# Patient Record
Sex: Male | Born: 1955 | Race: Black or African American | Hispanic: No | Marital: Married | Smoking: Current some day smoker
Health system: Southern US, Community
[De-identification: ages and names within clinical notes are randomized; demographics above are authoritative.]

## PROBLEM LIST (undated history)

## (undated) DIAGNOSIS — K219 Gastro-esophageal reflux disease without esophagitis: Secondary | ICD-10-CM

## (undated) DIAGNOSIS — Z720 Tobacco use: Secondary | ICD-10-CM

## (undated) DIAGNOSIS — F329 Major depressive disorder, single episode, unspecified: Secondary | ICD-10-CM

## (undated) DIAGNOSIS — R51 Headache: Secondary | ICD-10-CM

## (undated) DIAGNOSIS — E785 Hyperlipidemia, unspecified: Secondary | ICD-10-CM

## (undated) DIAGNOSIS — I739 Peripheral vascular disease, unspecified: Secondary | ICD-10-CM

## (undated) DIAGNOSIS — I2510 Atherosclerotic heart disease of native coronary artery without angina pectoris: Secondary | ICD-10-CM

## (undated) DIAGNOSIS — R519 Headache, unspecified: Secondary | ICD-10-CM

## (undated) DIAGNOSIS — T7840XA Allergy, unspecified, initial encounter: Secondary | ICD-10-CM

## (undated) DIAGNOSIS — I10 Essential (primary) hypertension: Secondary | ICD-10-CM

## (undated) DIAGNOSIS — F32A Depression, unspecified: Secondary | ICD-10-CM

## (undated) HISTORY — DX: Headache: R51

## (undated) HISTORY — PX: PERCUTANEOUS CORONARY STENT INTERVENTION (PCI-S): SHX6016

## (undated) HISTORY — DX: Allergy, unspecified, initial encounter: T78.40XA

## (undated) HISTORY — PX: STOMACH SURGERY: SHX791

## (undated) HISTORY — DX: Headache, unspecified: R51.9

## (undated) HISTORY — DX: Atherosclerotic heart disease of native coronary artery without angina pectoris: I25.10

## (undated) HISTORY — DX: Major depressive disorder, single episode, unspecified: F32.9

## (undated) HISTORY — DX: Gastro-esophageal reflux disease without esophagitis: K21.9

## (undated) HISTORY — PX: TONSILLECTOMY: SUR1361

## (undated) HISTORY — DX: Peripheral vascular disease, unspecified: I73.9

## (undated) HISTORY — DX: Hyperlipidemia, unspecified: E78.5

## (undated) HISTORY — DX: Essential (primary) hypertension: I10

## (undated) HISTORY — DX: Depression, unspecified: F32.A

## (undated) HISTORY — DX: Tobacco use: Z72.0

---

## 2005-05-31 ENCOUNTER — Ambulatory Visit: Payer: Self-pay | Admitting: Gastroenterology

## 2008-01-14 ENCOUNTER — Emergency Department: Payer: Self-pay | Admitting: Internal Medicine

## 2008-01-20 ENCOUNTER — Ambulatory Visit: Payer: Self-pay | Admitting: Vascular Surgery

## 2008-02-06 ENCOUNTER — Ambulatory Visit: Payer: Self-pay | Admitting: Gastroenterology

## 2008-02-14 ENCOUNTER — Ambulatory Visit: Payer: Self-pay | Admitting: Cardiology

## 2008-02-14 ENCOUNTER — Ambulatory Visit: Payer: Self-pay | Admitting: Vascular Surgery

## 2008-02-20 ENCOUNTER — Inpatient Hospital Stay: Payer: Self-pay | Admitting: Vascular Surgery

## 2011-06-22 ENCOUNTER — Emergency Department: Payer: Self-pay | Admitting: Unknown Physician Specialty

## 2011-06-27 DIAGNOSIS — I779 Disorder of arteries and arterioles, unspecified: Secondary | ICD-10-CM

## 2011-06-27 DIAGNOSIS — I25118 Atherosclerotic heart disease of native coronary artery with other forms of angina pectoris: Secondary | ICD-10-CM | POA: Insufficient documentation

## 2011-06-27 DIAGNOSIS — I255 Ischemic cardiomyopathy: Secondary | ICD-10-CM | POA: Insufficient documentation

## 2011-06-27 DIAGNOSIS — I2510 Atherosclerotic heart disease of native coronary artery without angina pectoris: Secondary | ICD-10-CM | POA: Insufficient documentation

## 2011-06-27 DIAGNOSIS — I739 Peripheral vascular disease, unspecified: Secondary | ICD-10-CM | POA: Insufficient documentation

## 2011-06-27 DIAGNOSIS — K219 Gastro-esophageal reflux disease without esophagitis: Secondary | ICD-10-CM | POA: Insufficient documentation

## 2011-06-27 DIAGNOSIS — I714 Abdominal aortic aneurysm, without rupture: Secondary | ICD-10-CM | POA: Insufficient documentation

## 2011-06-27 HISTORY — DX: Disorder of arteries and arterioles, unspecified: I77.9

## 2011-06-29 DIAGNOSIS — R079 Chest pain, unspecified: Secondary | ICD-10-CM | POA: Insufficient documentation

## 2011-06-29 DIAGNOSIS — Z720 Tobacco use: Secondary | ICD-10-CM | POA: Insufficient documentation

## 2011-09-12 DIAGNOSIS — E785 Hyperlipidemia, unspecified: Secondary | ICD-10-CM | POA: Insufficient documentation

## 2011-09-12 DIAGNOSIS — G64 Other disorders of peripheral nervous system: Secondary | ICD-10-CM | POA: Insufficient documentation

## 2011-09-12 DIAGNOSIS — G629 Polyneuropathy, unspecified: Secondary | ICD-10-CM | POA: Insufficient documentation

## 2012-02-19 DIAGNOSIS — M545 Low back pain: Secondary | ICD-10-CM | POA: Insufficient documentation

## 2012-02-19 DIAGNOSIS — G8929 Other chronic pain: Secondary | ICD-10-CM | POA: Insufficient documentation

## 2013-02-20 DIAGNOSIS — I4729 Other ventricular tachycardia: Secondary | ICD-10-CM | POA: Insufficient documentation

## 2013-02-20 DIAGNOSIS — I472 Ventricular tachycardia: Secondary | ICD-10-CM | POA: Insufficient documentation

## 2013-02-20 HISTORY — DX: Other ventricular tachycardia: I47.29

## 2013-03-03 ENCOUNTER — Ambulatory Visit: Payer: Self-pay | Admitting: Gastroenterology

## 2013-03-05 DIAGNOSIS — I509 Heart failure, unspecified: Secondary | ICD-10-CM | POA: Insufficient documentation

## 2013-03-05 DIAGNOSIS — I5022 Chronic systolic (congestive) heart failure: Secondary | ICD-10-CM | POA: Insufficient documentation

## 2013-03-05 HISTORY — DX: Heart failure, unspecified: I50.9

## 2013-03-10 DIAGNOSIS — Z95810 Presence of automatic (implantable) cardiac defibrillator: Secondary | ICD-10-CM | POA: Insufficient documentation

## 2013-06-30 DIAGNOSIS — Z4502 Encounter for adjustment and management of automatic implantable cardiac defibrillator: Secondary | ICD-10-CM | POA: Insufficient documentation

## 2013-06-30 HISTORY — DX: Encounter for adjustment and management of automatic implantable cardiac defibrillator: Z45.02

## 2014-09-15 ENCOUNTER — Other Ambulatory Visit: Payer: Self-pay | Admitting: Family Medicine

## 2014-10-02 ENCOUNTER — Other Ambulatory Visit: Payer: Self-pay | Admitting: Family Medicine

## 2014-10-07 ENCOUNTER — Other Ambulatory Visit: Payer: Self-pay | Admitting: Family Medicine

## 2014-10-07 ENCOUNTER — Encounter: Payer: Self-pay | Admitting: Family Medicine

## 2014-10-07 ENCOUNTER — Ambulatory Visit (INDEPENDENT_AMBULATORY_CARE_PROVIDER_SITE_OTHER): Payer: Medicare Other | Admitting: Family Medicine

## 2014-10-07 VITALS — BP 116/80 | HR 91 | Temp 98.0°F | Resp 16 | Ht 69.0 in | Wt 132.1 lb

## 2014-10-07 DIAGNOSIS — N410 Acute prostatitis: Secondary | ICD-10-CM

## 2014-10-07 DIAGNOSIS — I714 Abdominal aortic aneurysm, without rupture, unspecified: Secondary | ICD-10-CM

## 2014-10-07 DIAGNOSIS — M545 Low back pain, unspecified: Secondary | ICD-10-CM

## 2014-10-07 DIAGNOSIS — I5022 Chronic systolic (congestive) heart failure: Secondary | ICD-10-CM

## 2014-10-07 DIAGNOSIS — I255 Ischemic cardiomyopathy: Secondary | ICD-10-CM

## 2014-10-07 DIAGNOSIS — G8929 Other chronic pain: Secondary | ICD-10-CM | POA: Diagnosis not present

## 2014-10-07 DIAGNOSIS — E785 Hyperlipidemia, unspecified: Secondary | ICD-10-CM

## 2014-10-07 DIAGNOSIS — I2510 Atherosclerotic heart disease of native coronary artery without angina pectoris: Secondary | ICD-10-CM | POA: Diagnosis not present

## 2014-10-07 DIAGNOSIS — F17200 Nicotine dependence, unspecified, uncomplicated: Secondary | ICD-10-CM

## 2014-10-07 DIAGNOSIS — J40 Bronchitis, not specified as acute or chronic: Secondary | ICD-10-CM | POA: Diagnosis not present

## 2014-10-07 MED ORDER — TAMSULOSIN HCL 0.4 MG PO CAPS
0.4000 mg | ORAL_CAPSULE | Freq: Every day | ORAL | Status: DC
Start: 2014-10-07 — End: 2015-09-27

## 2014-10-07 MED ORDER — DOXYCYCLINE HYCLATE 100 MG PO TABS: 100.0000 mg | ORAL_TABLET | Freq: Two times a day (BID) | ORAL | Status: DC

## 2014-10-07 MED ORDER — OXYCODONE-ACETAMINOPHEN 5-325 MG PO TABS: 1.0000 | ORAL_TABLET | Freq: Four times a day (QID) | ORAL | Status: DC | PRN

## 2014-10-07 MED ORDER — BENZONATATE 100 MG PO CAPS: 100.0000 mg | ORAL_CAPSULE | Freq: Two times a day (BID) | ORAL | Status: DC | PRN

## 2014-10-07 NOTE — Progress Notes (Signed)
Name: Martin Hernandez   MRN: [id number]    DOB: 12-09-1955   Date:10/07/2014       Progress Note  Subjective  Chief Complaint  Chief Complaint  Patient presents with  . Insomnia  . Depression  . Hyperlipidemia    Hyperlipidemia This is a chronic problem. The current episode started more than 1 year ago. The problem is controlled. Recent lipid tests were reviewed and are normal. Factors aggravating his hyperlipidemia include fatty foods. Associated symptoms include chest pain, leg pain and shortness of breath. Pertinent negatives include no focal weakness or myalgias. Current antihyperlipidemic treatment includes statins. The current treatment provides moderate improvement of lipids. There are no compliance problems.  Risk factors for coronary artery disease include diabetes mellitus, dyslipidemia, hypertension, male sex, a sedentary lifestyle and stress.  Back Pain This is a chronic problem. The current episode started more than 1 year ago. The problem occurs constantly. The problem is unchanged. The pain is present in the lumbar spine. The quality of the pain is described as stabbing and aching. The pain radiates to the right thigh and left thigh. The pain is moderate. The pain is the same all the time. The symptoms are aggravated by bending, twisting, position and stress. Stiffness is present all day. Associated symptoms include chest pain, dysuria, leg pain, numbness and weakness. Pertinent negatives include no fever, headaches, tingling or weight loss. Risk factors include sedentary lifestyle. He has tried NSAIDs and muscle relaxant for the symptoms. The treatment provided mild relief.  Heart Problem This is a chronic problem. The current episode started more than 1 year ago. The problem occurs intermittently. The problem has been unchanged. Associated symptoms include chest pain, coughing, diaphoresis, fatigue, neck pain, numbness and weakness. Pertinent negatives include no chills,  congestion, fever, headaches, myalgias, nausea, sore throat or vomiting. The symptoms are aggravated by stress and exertion. The treatment provided mild relief.  Urinary Tract Infection  This is a new problem. The current episode started 1 to 4 weeks ago. The problem occurs intermittently. The problem has been gradually improving. The quality of the pain is described as aching. The pain is mild. There has been no fever. He is sexually active. Associated symptoms include frequency, hesitancy and urgency. Pertinent negatives include no chills, flank pain, hematuria, nausea or vomiting. He has tried antibiotics for the symptoms. The treatment provided mild relief.  Breathing Problem He complains of cough, difficulty breathing, shortness of breath, sputum production and wheezing. There is no hemoptysis. This is a new problem. The current episode started 1 to 4 weeks ago. The problem occurs constantly. The problem has been gradually worsening. The cough is productive of sputum. Associated symptoms include chest pain, malaise/fatigue, nasal congestion and rhinorrhea. Pertinent negatives include no fever, headaches, heartburn, myalgias, sore throat or weight loss. His symptoms are aggravated by exposure to smoke, exposure to fumes and pollen. His symptoms are alleviated by nothing. Risk factors for lung disease include smoking/tobacco exposure. His past medical history is significant for COPD.      Past Medical History  Diagnosis Date  . Anemia   . Depression   . Hyperlipidemia   . GERD (gastroesophageal reflux disease)     History  Substance Use Topics  . Smoking status: Current Every Day Smoker  . Smokeless tobacco: Not on file  . Alcohol Use: No     Current outpatient prescriptions:  .  atorvastatin (LIPITOR) 80 MG tablet, , Disp: , Rfl:  .  clopidogrel (PLAVIX) 75 MG  tablet, , Disp: , Rfl:  .  DULoxetine (CYMBALTA) 30 MG capsule, TAKE ONE CAPSULE BY MOUTH DAILY AND THENINCREASE TO TWO  CAPSULES DAILY AS NEEDED, Disp: 60 capsule, Rfl: 5 .  lisinopril (PRINIVIL,ZESTRIL) 5 MG tablet, , Disp: , Rfl:  .  metoprolol succinate (TOPROL-XL) 25 MG 24 hr tablet, , Disp: , Rfl:  .  mirtazapine (REMERON SOL-TAB) 30 MG disintegrating tablet, , Disp: , Rfl:  .  omeprazole (PRILOSEC) 20 MG capsule, , Disp: , Rfl:  .  oxyCODONE-acetaminophen (PERCOCET/ROXICET) 5-325 MG per tablet, , Disp: , Rfl:  .  pantoprazole (PROTONIX) 40 MG tablet, , Disp: , Rfl:  .  traZODone (DESYREL) 100 MG tablet, TAKE ONE TABLET EVERY EVENING FOR SLEEP, Disp: 30 tablet, Rfl: 2  No Known Allergies  Review of Systems  Constitutional: Positive for malaise/fatigue, diaphoresis and fatigue. Negative for fever, chills and weight loss.  HENT: Positive for rhinorrhea. Negative for congestion, hearing loss, sore throat and tinnitus.   Eyes: Negative for blurred vision, double vision and redness.  Respiratory: Positive for cough, sputum production, shortness of breath and wheezing. Negative for hemoptysis.   Cardiovascular: Positive for chest pain, palpitations, orthopnea and claudication. Negative for leg swelling.  Gastrointestinal: Negative for heartburn, nausea, vomiting, diarrhea, constipation and blood in stool.  Genitourinary: Positive for dysuria, hesitancy, urgency and frequency. Negative for hematuria and flank pain.  Musculoskeletal: Positive for back pain, joint pain and neck pain. Negative for myalgias and falls.  Skin: Negative for itching.  Neurological: Positive for weakness and numbness. Negative for dizziness, tingling, tremors, focal weakness, seizures, loss of consciousness and headaches.  Endo/Heme/Allergies: Does not bruise/bleed easily.  Psychiatric/Behavioral: Negative for depression and substance abuse. The patient is not nervous/anxious and does not have insomnia.      Objective  Filed Vitals:   10/07/14 0946  BP: 116/80  Pulse: 91  Temp: 98 F (36.7 C)  Resp: 16  Height:  (1.753  m)  Weight: 132 lb 2 oz (59.932 kg)  SpO2: 89%     Physical Exam  Constitutional: He is oriented to person, place, and time and well-developed, well-nourished, and in no distress.  HENT:  Head: Normocephalic.  Eyes: EOM are normal. Pupils are equal, round, and reactive to light.  Neck: Normal range of motion. Neck supple. No thyromegaly present.  Cardiovascular: Normal rate and regular rhythm.   No murmur heard. S4 gallop  Pulmonary/Chest: No respiratory distress. He has no wheezes.  Diminished breath sounds throughout with basilar crackles  Abdominal: Soft. Bowel sounds are normal.  Musculoskeletal: He exhibits no edema.  Cervical and lower lumbar tenderness both greater on the left and right straight leg raising is positive at about 75  Lymphadenopathy:    He has no cervical adenopathy.  Neurological: He is alert and oriented to person, place, and time. No cranial nerve deficit. Gait normal. Coordination normal.  Skin: Skin is warm and dry. No rash noted.  Psychiatric: Affect and judgment normal.      Assessment & Plan  1. Arteriosclerosis of coronary artery Stable  2. Cardiomyopathy, ischemic Stable  3. Compulsive tobacco user syndrome Slowly  decreasing his consumption  4. Chronic LBP Unchanged - oxyCODONE-acetaminophen (PERCOCET/ROXICET) 5-325 MG per tablet; Take 1 tablet by mouth every 6 (six) hours as needed for severe pain.  Dispense: 120 tablet; Refill: 0  5. AAA (abdominal aortic aneurysm) Followed by cardiology and general surgery  6. Chronic systolic heart failure [facility] cardiologist  7. HLD (hyperlipidemia) Lipid panel return  visit with treat acutely  8. Bronchitis We'll treat acutely - doxycycline (VIBRA-TABS) 100 MG tablet; Take 1 tablet (100 mg total) by mouth 2 (two) times daily.  Dispense: 20 tablet; Refill: 0 - benzonatate (TESSALON) 100 MG capsule; Take 1 capsule (100 mg total) by mouth 2 (two) times daily as needed for cough.  Dispense: 20  capsule; Refill: 0  9. Acute prostatitis Not resolved - doxycycline (VIBRA-TABS) 100 MG tablet; Take 1 tablet (100 mg total) by mouth 2 (two) times daily.  Dispense: 20 tablet; Refill: 0 - tamsulosin (FLOMAX) 0.4 MG CAPS capsule; Take 1 capsule (0.4 mg total) by mouth daily.  Dispense: 30 capsule; Refill: 3

## 2014-10-07 NOTE — Addendum Note (Signed)
Addended by: Dennison MascotMORRISEY, Wadsworth Skolnick on: 10/07/2014 11:51 AM   Modules accepted: Kipp BroodSmartSet

## 2014-10-07 NOTE — Patient Instructions (Signed)
F/U in 2 mo 

## 2014-11-06 ENCOUNTER — Other Ambulatory Visit: Payer: Self-pay | Admitting: Family Medicine

## 2014-11-10 ENCOUNTER — Other Ambulatory Visit: Payer: Self-pay | Admitting: Family Medicine

## 2014-12-07 ENCOUNTER — Ambulatory Visit (INDEPENDENT_AMBULATORY_CARE_PROVIDER_SITE_OTHER): Payer: Medicare Other | Admitting: Family Medicine

## 2014-12-07 ENCOUNTER — Encounter: Payer: Self-pay | Admitting: Family Medicine

## 2014-12-07 VITALS — BP 120/70 | HR 88 | Temp 98.1°F | Resp 18 | Ht 69.0 in | Wt 128.1 lb

## 2014-12-07 DIAGNOSIS — F17200 Nicotine dependence, unspecified, uncomplicated: Secondary | ICD-10-CM

## 2014-12-07 DIAGNOSIS — G8929 Other chronic pain: Secondary | ICD-10-CM

## 2014-12-07 DIAGNOSIS — J431 Panlobular emphysema: Secondary | ICD-10-CM

## 2014-12-07 DIAGNOSIS — M545 Low back pain, unspecified: Secondary | ICD-10-CM

## 2014-12-07 DIAGNOSIS — G4700 Insomnia, unspecified: Secondary | ICD-10-CM | POA: Diagnosis not present

## 2014-12-07 DIAGNOSIS — I255 Ischemic cardiomyopathy: Secondary | ICD-10-CM | POA: Diagnosis not present

## 2014-12-07 DIAGNOSIS — K219 Gastro-esophageal reflux disease without esophagitis: Secondary | ICD-10-CM | POA: Diagnosis not present

## 2014-12-07 DIAGNOSIS — I2510 Atherosclerotic heart disease of native coronary artery without angina pectoris: Secondary | ICD-10-CM

## 2014-12-07 HISTORY — DX: Insomnia, unspecified: G47.00

## 2014-12-07 MED ORDER — OXYCODONE-ACETAMINOPHEN 5-325 MG PO TABS: 1.0000 | ORAL_TABLET | Freq: Four times a day (QID) | ORAL | Status: DC | PRN

## 2014-12-07 MED ORDER — IPRATROPIUM BROMIDE 0.02 % IN SOLN: 0.5000 mg | Freq: Four times a day (QID) | RESPIRATORY_TRACT | Status: DC

## 2014-12-07 MED ORDER — ZOLPIDEM TARTRATE 10 MG PO TABS: 10.0000 mg | ORAL_TABLET | Freq: Every day | ORAL | Status: DC

## 2014-12-07 MED ORDER — OMEPRAZOLE 40 MG PO CPDR: 40.0000 mg | DELAYED_RELEASE_CAPSULE | Freq: Every day | ORAL | Status: DC

## 2014-12-07 NOTE — Progress Notes (Signed)
Name: Martin Hernandez   MRN: [id number]    DOB: 01/24/1956   Date:12/07/2014       Progress Note  Subjective  Chief Complaint  Chief Complaint  Patient presents with  . Gastrophageal Reflux    current medication not working  . Insomnia  . Back Pain    chronic medication refills    HPI  Chronic pain  Patient continues with chronic pain in his lower back radiating under his lower extremities. The pain is worse is 10 out of 10 at best is 5 out of 10 with this medication. Is currently currently on Percocet emorning thank you very 6 hours. there've been no recent traumatic injuries to worsen the problem. Weather changes seem to make it worse however.   Next problem is erectile dysfunction  Patient desires medication for erectile dysfunction. He is informed because he is on a nitrate he is unable to get such prescription.  Next tobacco abuse  Patient smokes an average of her pack cigarettes per day and has done so for number of years. He has a.m. cough with minimal sputum production. Chronic problem with wheezing is less pronounced.  Insomnia.  Trazodone is not working effectively patient's insomnia problem. He this was changed from Ambien because of his insurance and has not been effective.  ASCVD.  Patient denying any current problems with any chest pain or significant shortness of breath.   Past Medical History  Diagnosis Date  . Anemia   . Depression   . Hyperlipidemia   . GERD (gastroesophageal reflux disease)     Social History  Substance Use Topics  . Smoking status: Current Every Day Smoker -- 1.30 packs/day    Types: Cigarettes  . Smokeless tobacco: Not on file  . Alcohol Use: No     Current outpatient prescriptions:  .  atorvastatin (LIPITOR) 80 MG tablet, , Disp: , Rfl:  .  clopidogrel (PLAVIX) 75 MG tablet, TAKE ONE (1) TABLET EACH DAY, Disp: 30 tablet, Rfl: 11 .  doxycycline (VIBRAMYCIN) 100 MG capsule, TAKE ONE CAPSULE BY MOUTH TWICE A DAY, Disp:  20 capsule, Rfl: 0 .  DULoxetine (CYMBALTA) 30 MG capsule, TAKE ONE CAPSULE BY MOUTH DAILY AND THENINCREASE TO TWO CAPSULES DAILY AS NEEDED, Disp: 60 capsule, Rfl: 5 .  lisinopril (PRINIVIL,ZESTRIL) 5 MG tablet, , Disp: , Rfl:  .  metoprolol succinate (TOPROL-XL) 25 MG 24 hr tablet, , Disp: , Rfl:  .  mirtazapine (REMERON SOL-TAB) 30 MG disintegrating tablet, , Disp: , Rfl:  .  omeprazole (PRILOSEC) 20 MG capsule, , Disp: , Rfl:  .  oxyCODONE-acetaminophen (PERCOCET/ROXICET) 5-325 MG per tablet, Take 1 tablet by mouth every 6 (six) hours as needed for severe pain., Disp: 120 tablet, Rfl: 0 .  pantoprazole (PROTONIX) 40 MG tablet, , Disp: , Rfl:  .  tamsulosin (FLOMAX) 0.4 MG CAPS capsule, Take 1 capsule (0.4 mg total) by mouth daily., Disp: 30 capsule, Rfl: 3 .  traZODone (DESYREL) 100 MG tablet, TAKE ONE TABLET EVERY EVENING FOR SLEEP, Disp: 30 tablet, Rfl: 2 .  zolpidem (AMBIEN) 10 MG tablet, TAKE ONE TABLET AT BEDTIME, Disp: 30 tablet, Rfl: 2  No Known Allergies  Review of Systems  Constitutional: Negative for fever, chills and weight loss.  HENT: Negative for congestion, hearing loss, sore throat and tinnitus.   Eyes: Negative for blurred vision, double vision and redness.  Respiratory: Positive for cough and shortness of breath. Negative for hemoptysis.   Cardiovascular: Negative for chest pain, palpitations, orthopnea,  claudication and leg swelling.  Gastrointestinal: Negative for heartburn, nausea, vomiting, diarrhea, constipation and blood in stool.  Genitourinary: Negative for dysuria, urgency, frequency and hematuria.       Erectile dysfunction  Musculoskeletal: Negative for myalgias, back pain, joint pain, falls and neck pain.  Skin: Negative for itching.  Neurological: Negative for dizziness, tingling, tremors, focal weakness, seizures, loss of consciousness, weakness and headaches.  Endo/Heme/Allergies: Does not bruise/bleed easily.  Psychiatric/Behavioral: Negative for  depression and substance abuse. The patient has insomnia. The patient is not nervous/anxious.      Objective  Filed Vitals:   12/07/14 0757  BP: 120/70  Pulse: 88  Temp: 98.1 F (36.7 C)  TempSrc: Oral  Resp: 18  Height:  (1.753 m)  Weight: 128 lb 1.6 oz (58.106 kg)  SpO2: 99%     Physical Exam  Constitutional: He is oriented to person, place, and time.  Frail male in no acute distress  HENT:  Head: Normocephalic.  Eyes: EOM are normal. Pupils are equal, round, and reactive to light.  Neck: Normal range of motion. Neck supple. No thyromegaly present.  Cardiovascular: Normal rate, regular rhythm and normal heart sounds.   No murmur heard. Pulmonary/Chest: No respiratory distress. He has no wheezes.  Markedly diminished breath sounds with increase hyperresonance to percussion.  Abdominal: Soft. Bowel sounds are normal.  Musculoskeletal: He exhibits no edema.  Significant lower back discomfort lumbar area radiation to the posterior gluteal and leg area with decreased ability to ambulate.  Lymphadenopathy:    He has no cervical adenopathy.  Neurological: He is alert and oriented to person, place, and time. No cranial nerve deficit. Gait normal. Coordination normal.  Skin: Skin is warm and dry. No rash noted.  Psychiatric: Affect and judgment normal.      Assessment & Plan  1. Chronic LBP Stable   - oxyCODONE-acetaminophen (PERCOCET/ROXICET) 5-325 MG per tablet; Take 1 tablet by mouth every 6 (six) hours as needed for severe pain.  Dispense: 120 tablet; Refill: 0 - oxyCODONE-acetaminophen (PERCOCET/ROXICET) 5-325 MG per tablet; Take 1 tablet by mouth every 6 (six) hours as needed for severe pain.  Dispense: 120 tablet; Refill: 0 - oxyCODONE-acetaminophen (PERCOCET/ROXICET) 5-325 MG per tablet; Take 1 tablet by mouth every 6 (six) hours as needed for severe pain.  Dispense: 120 tablet; Refill: 0  2. Gastroesophageal reflux disease without esophagitis Not improved  on pantoprazole - omeprazole (PRILOSEC) 40 MG capsule; Take 1 capsule (40 mg total) by mouth daily.  Dispense: 30 capsule; Refill: 7  3. Arteriosclerosis of coronary artery Stable  4. Cardiomyopathy, ischemic Stable  5. Compulsive tobacco user syndrome Unchanged - Spirometry: Pre & Post Eval  6. Insomnia Not responsive to trazodone  7. Panlobular emphysema PFTs show severe obstruction  Again encouraged to discontinue smoking  - ipratropium (ATROVENT) 0.02 % nebulizer solution; Take 2.5 mLs (0.5 mg total) by nebulization 4 (four) times daily.  Dispense: 75 mL; Refill: 12

## 2014-12-07 NOTE — Patient Instructions (Signed)
2 months for repeat PFTs and for pain meds

## 2015-02-03 ENCOUNTER — Telehealth: Payer: Self-pay | Admitting: Family Medicine

## 2015-02-03 DIAGNOSIS — M545 Low back pain: Principal | ICD-10-CM

## 2015-02-03 DIAGNOSIS — G8929 Other chronic pain: Secondary | ICD-10-CM

## 2015-02-03 MED ORDER — OXYCODONE-ACETAMINOPHEN 5-325 MG PO TABS: 1.0000 | ORAL_TABLET | Freq: Four times a day (QID) | ORAL | Status: DC | PRN

## 2015-02-03 NOTE — Telephone Encounter (Signed)
Tried calling the number below and it says number is no longer in service. That is also the number that is on a sticky note that the wife gave us saying that the husbands number her does not answer sometimes. Will try to see if i can get him on his number. But rx is in the book ready for pick up.

## 2015-02-03 NOTE — Telephone Encounter (Signed)
PT HAD BEEN CALLED NUMEROUS TIMES ABOUT CHANGING HIS APPT FROM Thursday TO ANOTHER DAY BUT COULD NOT LEAVE VOICE MAIL. PATIENT HAS NOW CALLED IN SAYING THAT HE IS NEEDING HIS PAIN MEDICATION REFILLED. PT IS OUT. ASKING THAT YOU PLEASE CALL HIS [phone] THIS IS HIS WIFE NUMBER.

## 2015-02-03 NOTE — Telephone Encounter (Signed)
Patient notified to pick up script.

## 2015-02-03 NOTE — Telephone Encounter (Signed)
Called another number in system and wife said that she gave us the wrong number. Number should be [phone]. Told them rx was ready for pickup.

## 2015-02-04 ENCOUNTER — Ambulatory Visit: Payer: Medicare Other | Admitting: Family Medicine

## 2015-02-26 ENCOUNTER — Encounter: Payer: Self-pay | Admitting: Family Medicine

## 2015-02-26 ENCOUNTER — Ambulatory Visit (INDEPENDENT_AMBULATORY_CARE_PROVIDER_SITE_OTHER): Payer: Medicare Other | Admitting: Family Medicine

## 2015-02-26 VITALS — BP 138/80 | HR 101 | Temp 98.3°F | Resp 16 | Ht 69.0 in | Wt 134.0 lb

## 2015-02-26 DIAGNOSIS — F17200 Nicotine dependence, unspecified, uncomplicated: Secondary | ICD-10-CM | POA: Diagnosis not present

## 2015-02-26 DIAGNOSIS — M545 Low back pain, unspecified: Secondary | ICD-10-CM

## 2015-02-26 DIAGNOSIS — J431 Panlobular emphysema: Secondary | ICD-10-CM

## 2015-02-26 DIAGNOSIS — J40 Bronchitis, not specified as acute or chronic: Secondary | ICD-10-CM | POA: Diagnosis not present

## 2015-02-26 DIAGNOSIS — G8929 Other chronic pain: Secondary | ICD-10-CM | POA: Diagnosis not present

## 2015-02-26 DIAGNOSIS — I2510 Atherosclerotic heart disease of native coronary artery without angina pectoris: Secondary | ICD-10-CM

## 2015-02-26 MED ORDER — OXYCODONE-ACETAMINOPHEN 7.5-325 MG PO TABS: 1.0000 | ORAL_TABLET | Freq: Four times a day (QID) | ORAL | Status: DC | PRN

## 2015-02-26 NOTE — Progress Notes (Signed)
Name: ANVITH MAURIELLO   MRN: [id number]    DOB: [birth year]   Date:02/26/2015       Progress Note  Subjective  Chief Complaint  Chief Complaint  Patient presents with  . Insomnia  . Pain  . Hyperlipidemia    HPI  Chronic pain  Patient presents for follow-up of chronic pain syndrome. The pain score at worse is 10/10  and at best is 7/10 with medication rest and home remedies.  There is no evidence of any extra dosage or issues of usage outside of the prescribed regimen.  Pain is exacerbated by changes in weather and by strenuous activities. There have been no recent activities to exacerbate the chronic pain. Concomitant medications include Percocet 08/11/2023 4 times a day which is not fully effective  .  Drug screen and medication contract have been renewed within the last 1 year.    COPD  Patient states that he only the Atrovent solution and does not have a nebulizer at this time. He continues to smoke a small amount daily. Has minimal cough. He is unable to tolerate flu vaccine  Coronary artery disease  Patient has had MI and has a cardiac pacer for which she is been followed at [facility]. He has a regimen keeps him pain-free usually. There's no shortness of breath or palpitations noted.  Hyperlipidemia  Patient currently on a regimen atorvastatin on a regular basis. His last cholesterol level obtained here was 714. He is being followed by cardiologist    Past Medical History  Diagnosis Date  . Anemia   . Depression   . Hyperlipidemia   . GERD (gastroesophageal reflux disease)     Social History  Substance Use Topics  . Smoking status: Current Every Day Smoker -- 1.30 packs/day    Types: Cigarettes  . Smokeless tobacco: Not on file  . Alcohol Use: No     Current outpatient prescriptions:  .  atorvastatin (LIPITOR) 80 MG tablet, , Disp: , Rfl:  .  clopidogrel (PLAVIX) 75 MG tablet, TAKE ONE (1) TABLET EACH DAY, Disp: 30 tablet, Rfl: 11 .   doxycycline (VIBRAMYCIN) 100 MG capsule, TAKE ONE CAPSULE BY MOUTH TWICE A DAY, Disp: 20 capsule, Rfl: 0 .  DULoxetine (CYMBALTA) 30 MG capsule, TAKE ONE CAPSULE BY MOUTH DAILY AND THENINCREASE TO TWO CAPSULES DAILY AS NEEDED, Disp: 60 capsule, Rfl: 5 .  lisinopril (PRINIVIL,ZESTRIL) 5 MG tablet, , Disp: , Rfl:  .  metoprolol succinate (TOPROL-XL) 25 MG 24 hr tablet, , Disp: , Rfl:  .  mirtazapine (REMERON) 30 MG tablet, , Disp: , Rfl:  .  omeprazole (PRILOSEC) 20 MG capsule, , Disp: , Rfl:  .  oxyCODONE-acetaminophen (PERCOCET/ROXICET) 5-325 MG tablet, Take 1 tablet by mouth every 6 (six) hours as needed for severe pain., Disp: 120 tablet, Rfl: 0 .  pantoprazole (PROTONIX) 40 MG tablet, , Disp: , Rfl:  .  tamsulosin (FLOMAX) 0.4 MG CAPS capsule, Take 1 capsule (0.4 mg total) by mouth daily., Disp: 30 capsule, Rfl: 3  No Known Allergies  Review of Systems  Constitutional: Negative for fever, chills and weight loss.  HENT: Negative for congestion, hearing loss, sore throat and tinnitus.   Eyes: Negative for blurred vision, double vision and redness.  Respiratory: Positive for cough and shortness of breath. Negative for hemoptysis.   Cardiovascular: Negative for chest pain, palpitations, orthopnea, claudication and leg swelling.  Gastrointestinal: Negative for heartburn, nausea, vomiting, diarrhea, constipation and blood in stool.  Genitourinary: Negative for dysuria,  urgency, frequency and hematuria.  Musculoskeletal: Positive for back pain and neck pain. Negative for myalgias, joint pain and falls.  Skin: Negative for itching.  Neurological: Negative for dizziness, tingling, tremors, focal weakness, seizures, loss of consciousness, weakness and headaches.  Endo/Heme/Allergies: Does not bruise/bleed easily.  Psychiatric/Behavioral: Negative for depression and substance abuse. The patient is not nervous/anxious and does not have insomnia.      Objective  Filed Vitals:   02/26/15 0806   BP: 138/80  Pulse: 101  Temp: 98.3 F (36.8 C)  Resp: 16  Height: 5\' 9"  (1.753 m)  Weight: 134 lb (60.782 kg)  SpO2: 97%     Physical Exam  Constitutional: He is oriented to person, place, and time.  Frail chronically ill-appearing male who appears older than 60 years  HENT:  Head: Normocephalic.  Eyes: EOM are normal. Pupils are equal, round, and reactive to light.  Neck: Normal range of motion. Neck supple. No thyromegaly present.  Cardiovascular: Normal rate, regular rhythm and normal heart sounds.   No murmur heard. Pulmonary/Chest: Breath sounds normal. No respiratory distress. He has no wheezes.  Diminished breath sounds with hyperresonance to percussion  Abdominal: Soft. Bowel sounds are normal.  Musculoskeletal: Normal range of motion. He exhibits tenderness (tender along the paraspinal muscles in the thoracic lumbar area). He exhibits no edema.  Lymphadenopathy:    He has no cervical adenopathy.  Neurological: He is alert and oriented to person, place, and time. No cranial nerve deficit. Gait normal. Coordination normal.  Skin: Skin is warm and dry. No rash noted.  Psychiatric: Affect and judgment normal.      Assessment & Plan  1. Arteriosclerosis of coronary artery Stable we'll cardiologist  2. Bronchitis Ipratropium by nebulizer - DME Nebulizer machine  3. Compulsive tobacco user syndrome Again encouraged continuation  4. Chronic LBP Increase Percocet to 7. 08/11/2023 SCDs not controlled at this level  5. Panlobular emphysema (HCC) Continue inhalers and again encouraged discontinuation of smoking

## 2015-03-11 ENCOUNTER — Other Ambulatory Visit: Payer: Self-pay | Admitting: Family Medicine

## 2015-03-12 ENCOUNTER — Other Ambulatory Visit: Payer: Self-pay

## 2015-03-12 MED ORDER — NITROGLYCERIN 0.4 MG SL SUBL: 0.4000 mg | SUBLINGUAL_TABLET | SUBLINGUAL | Status: DC | PRN

## 2015-03-15 ENCOUNTER — Telehealth: Payer: Self-pay | Admitting: Family Medicine

## 2015-03-15 NOTE — Telephone Encounter (Signed)
Pt states his pain meds are due to be filled on 03/28/15 and wants to know if it can be done the Friday before since 03/28/15 is a Sunday.

## 2015-03-22 NOTE — Telephone Encounter (Signed)
Spoke to [employer] and informed her that it was ok to refill medication on 12/16

## 2015-03-29 DIAGNOSIS — R339 Retention of urine, unspecified: Secondary | ICD-10-CM | POA: Insufficient documentation

## 2015-03-29 DIAGNOSIS — Z8679 Personal history of other diseases of the circulatory system: Secondary | ICD-10-CM

## 2015-03-29 DIAGNOSIS — Z9889 Other specified postprocedural states: Secondary | ICD-10-CM

## 2015-03-29 DIAGNOSIS — K581 Irritable bowel syndrome with constipation: Secondary | ICD-10-CM | POA: Insufficient documentation

## 2015-03-29 HISTORY — DX: Retention of urine, unspecified: R33.9

## 2015-03-29 HISTORY — DX: Personal history of other diseases of the circulatory system: Z86.79

## 2015-03-29 LAB — HM HIV SCREENING LAB: HM HIV Screening: NEGATIVE

## 2015-05-06 ENCOUNTER — Other Ambulatory Visit: Payer: Self-pay | Admitting: Family Medicine

## 2015-05-06 MED ORDER — OXYCODONE-ACETAMINOPHEN 7.5-325 MG PO TABS: 1.0000 | ORAL_TABLET | Freq: Four times a day (QID) | ORAL | Status: DC | PRN

## 2015-05-12 ENCOUNTER — Other Ambulatory Visit: Payer: Self-pay | Admitting: Family Medicine

## 2015-05-25 ENCOUNTER — Other Ambulatory Visit: Payer: Self-pay | Admitting: Family Medicine

## 2015-05-25 MED ORDER — OXYCODONE-ACETAMINOPHEN 7.5-325 MG PO TABS: 1.0000 | ORAL_TABLET | Freq: Four times a day (QID) | ORAL | Status: DC | PRN

## 2015-05-27 ENCOUNTER — Other Ambulatory Visit: Payer: Self-pay

## 2015-05-27 ENCOUNTER — Ambulatory Visit: Payer: Medicare Other | Admitting: Family Medicine

## 2015-05-27 MED ORDER — LISINOPRIL 5 MG PO TABS: ORAL_TABLET | ORAL | Status: DC

## 2015-05-27 MED ORDER — ATORVASTATIN CALCIUM 80 MG PO TABS: 80.0000 mg | ORAL_TABLET | Freq: Every day | ORAL | Status: DC

## 2015-05-27 MED ORDER — CLOPIDOGREL BISULFATE 75 MG PO TABS: ORAL_TABLET | ORAL | Status: DC

## 2015-06-09 ENCOUNTER — Telehealth: Payer: Self-pay | Admitting: Family Medicine

## 2015-06-09 DIAGNOSIS — R05 Cough: Secondary | ICD-10-CM

## 2015-06-09 DIAGNOSIS — R059 Cough, unspecified: Secondary | ICD-10-CM

## 2015-06-09 MED ORDER — BENZONATATE 200 MG PO CAPS: 200.0000 mg | ORAL_CAPSULE | Freq: Three times a day (TID) | ORAL | Status: DC | PRN

## 2015-06-09 NOTE — Telephone Encounter (Signed)
Would like a refill on Tessalon Perles 100 mg to [facility] for cough

## 2015-06-09 NOTE — Telephone Encounter (Signed)
Sent to [facility]

## 2015-06-09 NOTE — Addendum Note (Signed)
Addended by: Edwena Felty on: 06/09/2015 10:59 AM   Modules accepted: Orders

## 2015-06-16 ENCOUNTER — Other Ambulatory Visit: Payer: Self-pay | Admitting: Family Medicine

## 2015-06-23 ENCOUNTER — Ambulatory Visit: Payer: Medicare Other | Admitting: Family Medicine

## 2015-06-29 ENCOUNTER — Other Ambulatory Visit: Payer: Self-pay | Admitting: Family Medicine

## 2015-07-09 ENCOUNTER — Other Ambulatory Visit: Payer: Self-pay | Admitting: Family Medicine

## 2015-07-19 ENCOUNTER — Ambulatory Visit: Payer: Medicare Other | Admitting: Family Medicine

## 2015-07-26 ENCOUNTER — Other Ambulatory Visit: Payer: Self-pay

## 2015-07-26 MED ORDER — OXYCODONE-ACETAMINOPHEN 7.5-325 MG PO TABS: 1.0000 | ORAL_TABLET | Freq: Four times a day (QID) | ORAL | Status: DC | PRN

## 2015-07-28 ENCOUNTER — Telehealth: Payer: Self-pay

## 2015-07-28 NOTE — Telephone Encounter (Signed)
Pt called on Monday 07/26/15 and I spoke with pt and explained that Thana AtesMorrisey has not signed his medication and that I would call him once it was signed. Pt has his wife come by yesterday on 07/27/15 and she sat for almost an hour and spoke with the manager concerning her husbands medication and was given the same information.  Wife stated that pt had ran out of his medication earlier than he should have.Pt called back today and inquired about his medication again and I explained to the pt that we had informed him last month to call at least a week prior to running out of medication. Pt had his wife call [facility] again this afternoon and spoke with manager again concerning pain medication.

## 2015-07-29 ENCOUNTER — Ambulatory Visit (INDEPENDENT_AMBULATORY_CARE_PROVIDER_SITE_OTHER): Payer: Medicare Other | Admitting: Family Medicine

## 2015-07-29 ENCOUNTER — Encounter: Payer: Self-pay | Admitting: Family Medicine

## 2015-07-29 VITALS — BP 124/82 | HR 83 | Temp 98.9°F | Resp 18 | Ht 69.0 in | Wt 131.5 lb

## 2015-07-29 DIAGNOSIS — G8929 Other chronic pain: Secondary | ICD-10-CM | POA: Diagnosis not present

## 2015-07-29 DIAGNOSIS — M545 Low back pain: Secondary | ICD-10-CM | POA: Diagnosis not present

## 2015-07-29 MED ORDER — OXYCODONE-ACETAMINOPHEN 7.5-325 MG PO TABS: 1.0000 | ORAL_TABLET | Freq: Four times a day (QID) | ORAL | Status: DC | PRN

## 2015-07-29 NOTE — Progress Notes (Signed)
Name: Geralyn FlashLindsay F Simonet   MRN: [id number]    DOB: Oct 11, 1955   Date:07/29/2015       Progress Note  Subjective  Chief Complaint  Chief Complaint  Patient presents with  . Back Pain  . Chronic Pain Med Refill    Back Pain This is a chronic problem. The pain is present in the lumbar spine. The pain radiates to the right thigh, right knee and right foot. The pain is at a severity of 9/10. The pain is severe. The pain is the same all the time. The symptoms are aggravated by sitting and lying down. Associated symptoms include paresis. Pertinent negatives include no bladder incontinence or bowel incontinence. He has tried analgesics (Has been on Percocet 7.5-325 mg every 6 hours as needed.) for the symptoms.    Past Medical History  Diagnosis Date  . Anemia   . Depression   . Hyperlipidemia   . GERD (gastroesophageal reflux disease)     Past Surgical History  Procedure Laterality Date  . Cardiac catheterization      Family History  Problem Relation Age of Onset  . Diabetes Mother   . Heart disease Mother   . Diabetes Sister   . Heart disease Sister     Social History   Social History  . Marital Status: Married    Spouse Name: N/A  . Number of Children: N/A  . Years of Education: N/A   Occupational History  . Not on file.   Social History Main Topics  . Smoking status: Current Every Day Smoker -- 1.30 packs/day    Types: Cigarettes  . Smokeless tobacco: Not on file  . Alcohol Use: No  . Drug Use: No  . Sexual Activity: Not on file   Other Topics Concern  . Not on file   Social History Narrative     Current outpatient prescriptions:  .  atorvastatin (LIPITOR) 80 MG tablet, Take 1 tablet (80 mg total) by mouth daily at 6 PM., Disp: 30 tablet, Rfl: 3 .  benzonatate (TESSALON) 200 MG capsule, Take 1 capsule (200 mg total) by mouth 3 (three) times daily as needed for cough., Disp: 30 capsule, Rfl: 0 .  clopidogrel (PLAVIX) 75 MG tablet, TAKE ONE (1) TABLET EACH  DAY, Disp: 30 tablet, Rfl: 11 .  doxycycline (VIBRAMYCIN) 100 MG capsule, TAKE ONE CAPSULE BY MOUTH TWICE A DAY, Disp: 20 capsule, Rfl: 0 .  DULoxetine (CYMBALTA) 30 MG capsule, TAKE ONE CAPSULE BY MOUTH DAILY AND THENINCREASE TO TWO CAPSULES DAILY AS NEEDED, Disp: 60 capsule, Rfl: 5 .  lisinopril (PRINIVIL,ZESTRIL) 5 MG tablet, TAKE ONE (1) TABLET EACH DAY, Disp: 30 tablet, Rfl: 2 .  metoprolol succinate (TOPROL-XL) 25 MG 24 hr tablet, , Disp: , Rfl:  .  mirtazapine (REMERON) 30 MG tablet, TAKE ONE (1) TABLET BY MOUTH EVERY DAY, Disp: 30 tablet, Rfl: 1 .  nitroGLYCERIN (NITROSTAT) 0.4 MG SL tablet, Place 1 tablet (0.4 mg total) under the tongue every 5 (five) minutes as needed for chest pain., Disp: 50 tablet, Rfl: 3 .  omeprazole (PRILOSEC) 20 MG capsule, , Disp: , Rfl:  .  oxyCODONE-acetaminophen (PERCOCET) 7.5-325 MG tablet, Take 1 tablet by mouth every 6 (six) hours as needed for severe pain., Disp: 120 tablet, Rfl: 0 .  oxyCODONE-acetaminophen (PERCOCET) 7.5-325 MG tablet, Take 1 tablet by mouth every 6 (six) hours as needed for severe pain., Disp: 120 tablet, Rfl: 0 .  pantoprazole (PROTONIX) 40 MG tablet, , Disp: , Rfl:  .  pantoprazole (PROTONIX) 40 MG tablet, TAKE ONE (1) TABLET EACH DAY, Disp: 90 tablet, Rfl: 1 .  tamsulosin (FLOMAX) 0.4 MG CAPS capsule, Take 1 capsule (0.4 mg total) by mouth daily., Disp: 30 capsule, Rfl: 3 .  traZODone (DESYREL) 100 MG tablet, TAKE ONE TABLET EVERY EVENING FOR SLEEP, Disp: 30 tablet, Rfl: 5 .  zolpidem (AMBIEN) 10 MG tablet, TAKE ONE TABLET AT BEDTIME, Disp: 30 tablet, Rfl: 2  No Known Allergies   Review of Systems  Gastrointestinal: Negative for bowel incontinence.  Genitourinary: Negative for bladder incontinence.  Musculoskeletal: Positive for back pain.    Objective  Filed Vitals:   07/29/15 1504  BP: 124/82  Pulse: 83  Temp: 98.9 F (37.2 C)  TempSrc: Oral  Resp: 18  Height:  (1.753 m)  Weight: 131 lb 8 oz (59.648 kg)    SpO2: 98%    Physical Exam  Constitutional: He is oriented to person, place, and time and well-developed, well-nourished, and in no distress.  Musculoskeletal:       Lumbar back: He exhibits tenderness, pain and spasm.       Back:  Neurological: He is alert and oriented to person, place, and time.  Nursing note and vitals reviewed.    Assessment & Plan  1. Chronic LBP Notes from PCP (Dr. Charlette Caffey) reviewed. History of degenerative disc disease of the lumbar spine, continue on Percocet every 6 hours as needed. Follow-up with PCP for consideration of muscle relaxant therapy in addition to opioid therapy. - oxyCODONE-acetaminophen (PERCOCET) 7.5-325 MG tablet; Take 1 tablet by mouth every 6 (six) hours as needed for severe pain.  Dispense: 120 tablet; Refill: 0   Brexley Cutshaw Asad A. [facility] 07/29/2015 3:09 PM

## 2015-08-24 ENCOUNTER — Ambulatory Visit (INDEPENDENT_AMBULATORY_CARE_PROVIDER_SITE_OTHER): Payer: Medicare Other | Admitting: Family Medicine

## 2015-08-24 ENCOUNTER — Encounter: Payer: Self-pay | Admitting: Family Medicine

## 2015-08-24 VITALS — BP 150/80 | HR 107 | Temp 98.8°F | Resp 16 | Ht 69.0 in | Wt 131.1 lb

## 2015-08-24 DIAGNOSIS — M545 Low back pain, unspecified: Secondary | ICD-10-CM

## 2015-08-24 DIAGNOSIS — G8929 Other chronic pain: Secondary | ICD-10-CM | POA: Diagnosis not present

## 2015-08-24 MED ORDER — OXYCODONE-ACETAMINOPHEN 10-325 MG PO TABS: 1.0000 | ORAL_TABLET | Freq: Three times a day (TID) | ORAL | Status: DC | PRN

## 2015-08-24 NOTE — Progress Notes (Signed)
Name: Martin Hernandez   MRN: [id number]    DOB: 08-Dec-1955   Date:08/24/2015       Progress Note  Subjective  Chief Complaint  Chief Complaint  Patient presents with  . Pain    medication refills. would like muscle relaxer    Back Pain This is a chronic problem. The problem has been gradually worsening (Worsening, has been 'doubling up' on Percocet at night to get comfortable,  informs me that Dr. Thana Ates told him  he will go up to 10 mg on Percocet after trying him on 7.5 mg for a few months. However, this is not documented in Dr. Clance Boll notes.) since onset. The pain is present in the lumbar spine. The pain is at a severity of 8/10. The pain is moderate. Associated symptoms include numbness (numbness and tingling in his feet.). Pertinent negatives include no bladder incontinence or bowel incontinence. He has tried analgesics for the symptoms.     Past Medical History  Diagnosis Date  . Anemia   . Depression   . Hyperlipidemia   . GERD (gastroesophageal reflux disease)     Past Surgical History  Procedure Laterality Date  . Cardiac catheterization      Family History  Problem Relation Age of Onset  . Diabetes Mother   . Heart disease Mother   . Diabetes Sister   . Heart disease Sister     Social History   Social History  . Marital Status: Married    Spouse Name: N/A  . Number of Children: N/A  . Years of Education: N/A   Occupational History  . Not on file.   Social History Main Topics  . Smoking status: Current Every Day Smoker -- 1.30 packs/day    Types: Cigarettes  . Smokeless tobacco: Not on file  . Alcohol Use: No  . Drug Use: No  . Sexual Activity: Not on file   Other Topics Concern  . Not on file   Social History Narrative    Current outpatient prescriptions:  .  atorvastatin (LIPITOR) 80 MG tablet, Take 1 tablet (80 mg total) by mouth daily at 6 PM., Disp: 30 tablet, Rfl: 3 .  clopidogrel (PLAVIX) 75 MG tablet, TAKE ONE (1) TABLET EACH  DAY, Disp: 30 tablet, Rfl: 11 .  doxycycline (VIBRAMYCIN) 100 MG capsule, TAKE ONE CAPSULE BY MOUTH TWICE A DAY, Disp: 20 capsule, Rfl: 0 .  DULoxetine (CYMBALTA) 30 MG capsule, TAKE ONE CAPSULE BY MOUTH DAILY AND THENINCREASE TO TWO CAPSULES DAILY AS NEEDED, Disp: 60 capsule, Rfl: 5 .  lisinopril (PRINIVIL,ZESTRIL) 5 MG tablet, TAKE ONE (1) TABLET EACH DAY, Disp: 30 tablet, Rfl: 2 .  metoprolol succinate (TOPROL-XL) 25 MG 24 hr tablet, , Disp: , Rfl:  .  mirtazapine (REMERON) 30 MG tablet, TAKE ONE (1) TABLET BY MOUTH EVERY DAY, Disp: 30 tablet, Rfl: 1 .  nitroGLYCERIN (NITROSTAT) 0.4 MG SL tablet, Place 1 tablet (0.4 mg total) under the tongue every 5 (five) minutes as needed for chest pain., Disp: 50 tablet, Rfl: 3 .  omeprazole (PRILOSEC) 20 MG capsule, , Disp: , Rfl:  .  oxyCODONE-acetaminophen (PERCOCET) 7.5-325 MG tablet, Take 1 tablet by mouth every 6 (six) hours as needed for severe pain., Disp: 120 tablet, Rfl: 0 .  oxyCODONE-acetaminophen (PERCOCET) 7.5-325 MG tablet, Take 1 tablet by mouth every 6 (six) hours as needed for severe pain., Disp: 120 tablet, Rfl: 0 .  pantoprazole (PROTONIX) 40 MG tablet, , Disp: , Rfl:  .  pantoprazole (  PROTONIX) 40 MG tablet, TAKE ONE (1) TABLET EACH DAY, Disp: 90 tablet, Rfl: 1 .  tamsulosin (FLOMAX) 0.4 MG CAPS capsule, Take 1 capsule (0.4 mg total) by mouth daily., Disp: 30 capsule, Rfl: 3 .  traZODone (DESYREL) 100 MG tablet, TAKE ONE TABLET EVERY EVENING FOR SLEEP, Disp: 30 tablet, Rfl: 5 .  zolpidem (AMBIEN) 10 MG tablet, TAKE ONE TABLET AT BEDTIME, Disp: 30 tablet, Rfl: 2  No Known Allergies   Review of Systems  Gastrointestinal: Negative for bowel incontinence.  Genitourinary: Negative for bladder incontinence.  Musculoskeletal: Positive for back pain.  Neurological: Positive for numbness (numbness and tingling in his feet.).    Objective  Filed Vitals:   08/24/15 1053  BP: 150/80  Pulse: 107  Temp: 98.8 F (37.1 C)  TempSrc: Oral    Resp: 16  Height: 5\' 9"  (1.753 m)  Weight: 131 lb 1.6 oz (59.467 kg)  SpO2: 98%    Physical Exam  Constitutional: He is well-developed, well-nourished, and in no distress.  Musculoskeletal:       Lumbar back: He exhibits tenderness, pain and spasm.       Back:  Nursing note and vitals reviewed.      Assessment & Plan  1. Chronic LBP Increase Percocet to 10 mg, however decrease the frequency to every 8 hours as needed. Obtain urine drug screen as part of controlled substances agreement and an updated x-ray of the lumbar spine. Follow-up in one month - oxyCODONE-acetaminophen (PERCOCET) 10-325 MG tablet; Take 1 tablet by mouth every 8 (eight) hours as needed for pain.  Dispense: 90 tablet; Refill: 0 - Drug Screen 12+Alcohol+CRT, Ur - DG Lumbar Spine Complete; Future   Tomie Spizzirri Asad A. Faylene KurtzShah [facility] 08/24/2015 11:02 AM

## 2015-09-10 ENCOUNTER — Other Ambulatory Visit: Payer: Self-pay | Admitting: Family Medicine

## 2015-09-14 ENCOUNTER — Ambulatory Visit
Admission: RE | Admit: 2015-09-14 | Discharge: 2015-09-14 | Disposition: A | Discharge status: Home / Self Care | Payer: Medicare Other | Source: Ambulatory Visit | Attending: Family Medicine | Admitting: Family Medicine

## 2015-09-14 DIAGNOSIS — M545 Low back pain, unspecified: Secondary | ICD-10-CM

## 2015-09-14 DIAGNOSIS — M5136 Other intervertebral disc degeneration, lumbar region: Secondary | ICD-10-CM | POA: Insufficient documentation

## 2015-09-14 DIAGNOSIS — G8929 Other chronic pain: Secondary | ICD-10-CM

## 2015-09-24 ENCOUNTER — Encounter: Payer: Self-pay | Admitting: Family Medicine

## 2015-09-24 ENCOUNTER — Ambulatory Visit (INDEPENDENT_AMBULATORY_CARE_PROVIDER_SITE_OTHER): Payer: Medicare Other | Admitting: Family Medicine

## 2015-09-24 VITALS — BP 132/86 | HR 72 | Temp 98.4°F | Resp 16 | Ht 69.0 in | Wt 130.8 lb

## 2015-09-24 DIAGNOSIS — F1190 Opioid use, unspecified, uncomplicated: Secondary | ICD-10-CM | POA: Insufficient documentation

## 2015-09-24 DIAGNOSIS — R634 Abnormal weight loss: Secondary | ICD-10-CM | POA: Insufficient documentation

## 2015-09-24 HISTORY — DX: Abnormal weight loss: R63.4

## 2015-09-24 NOTE — Progress Notes (Signed)
Name: Martin Hernandez   MRN: [id number]    DOB: 04-Feb-1956   Date:09/24/2015       Progress Note  Subjective  Chief Complaint  Chief Complaint  Patient presents with  . Follow-up    1 mo  . Medication Refill    ambien    Insomnia Primary symptoms: sleep disturbance, no difficulty falling asleep, frequent awakening, malaise/fatigue.  The symptoms are aggravated by pain. Typical bedtime:  8-10 P.M..  How long after going to bed to you fall asleep: 15-30 minutes.     Appetite Stimulant: Pt. Is requesting refill for Remeron, prescribed by PCP for stimulating appetite, reports he is not eating nearly as much as he should. He has lost 6-7lbs in the last month.  Past Medical History  Diagnosis Date  . Anemia   . Depression   . Hyperlipidemia   . GERD (gastroesophageal reflux disease)     Past Surgical History  Procedure Laterality Date  . Cardiac catheterization      Family History  Problem Relation Age of Onset  . Diabetes Mother   . Heart disease Mother   . Diabetes Sister   . Heart disease Sister     Social History   Social History  . Marital Status: Married    Spouse Name: N/A  . Number of Children: N/A  . Years of Education: N/A   Occupational History  . Not on file.   Social History Main Topics  . Smoking status: Current Every Day Smoker -- 1.30 packs/day    Types: Cigarettes  . Smokeless tobacco: Not on file  . Alcohol Use: No  . Drug Use: No  . Sexual Activity: Not on file   Other Topics Concern  . Not on file   Social History Narrative     Current outpatient prescriptions:  .  atorvastatin (LIPITOR) 80 MG tablet, Take 1 tablet (80 mg total) by mouth daily at 6 PM., Disp: 30 tablet, Rfl: 3 .  clopidogrel (PLAVIX) 75 MG tablet, TAKE ONE (1) TABLET EACH DAY, Disp: 30 tablet, Rfl: 11 .  doxycycline (VIBRAMYCIN) 100 MG capsule, TAKE ONE CAPSULE BY MOUTH TWICE A DAY, Disp: 20 capsule, Rfl: 0 .  DULoxetine (CYMBALTA) 30 MG capsule, TAKE ONE  CAPSULE BY MOUTH DAILY AND THENINCREASE TO TWO CAPSULES DAILY AS NEEDED, Disp: 60 capsule, Rfl: 5 .  lisinopril (PRINIVIL,ZESTRIL) 5 MG tablet, TAKE ONE (1) TABLET EACH DAY, Disp: 30 tablet, Rfl: 2 .  metoprolol succinate (TOPROL-XL) 25 MG 24 hr tablet, , Disp: , Rfl:  .  mirtazapine (REMERON) 30 MG tablet, TAKE ONE (1) TABLET BY MOUTH EVERY DAY, Disp: 30 tablet, Rfl: 1 .  nitroGLYCERIN (NITROSTAT) 0.4 MG SL tablet, Place 1 tablet (0.4 mg total) under the tongue every 5 (five) minutes as needed for chest pain., Disp: 50 tablet, Rfl: 3 .  omeprazole (PRILOSEC) 20 MG capsule, , Disp: , Rfl:  .  oxyCODONE-acetaminophen (PERCOCET) 10-325 MG tablet, Take 1 tablet by mouth every 8 (eight) hours as needed for pain., Disp: 90 tablet, Rfl: 0 .  pantoprazole (PROTONIX) 40 MG tablet, , Disp: , Rfl:  .  pantoprazole (PROTONIX) 40 MG tablet, TAKE ONE (1) TABLET EACH DAY, Disp: 90 tablet, Rfl: 1 .  tamsulosin (FLOMAX) 0.4 MG CAPS capsule, Take 1 capsule (0.4 mg total) by mouth daily., Disp: 30 capsule, Rfl: 3 .  traZODone (DESYREL) 100 MG tablet, TAKE ONE TABLET EVERY EVENING FOR SLEEP, Disp: 30 tablet, Rfl: 5 .  zolpidem (AMBIEN) 10  MG tablet, TAKE ONE TABLET AT BEDTIME, Disp: 30 tablet, Rfl: 2  No Known Allergies   Review of Systems  Constitutional: Positive for malaise/fatigue.  Musculoskeletal: Positive for back pain.  Psychiatric/Behavioral: Positive for sleep disturbance. The patient has insomnia.      Objective  Filed Vitals:   09/24/15 0854  BP: 132/86  Pulse: 72  Temp: 98.4 F (36.9 C)  TempSrc: Oral  Resp: 16  Height: 5\' 9"  (1.753 m)  Weight: 130 lb 12.8 oz (59.33 kg)  SpO2: 99%    Physical Exam  Constitutional: He is oriented to person, place, and time and well-developed, well-nourished, and in no distress.  Cardiovascular: Normal rate and regular rhythm.   No murmur heard. Pulmonary/Chest: Effort normal and breath sounds normal. He has no wheezes.  Neurological: He is alert  and oriented to person, place, and time.  Psychiatric: Mood, memory, affect and judgment normal.  Nursing note and vitals reviewed.      Assessment & Plan  1. Chronic, continuous use of opioids  Note: patient is here requesting refills for oxycodone, Ambien, and mirtazapine. Urine drug screen, obtained last month was inconsistent and oxycodone was not detected in his urine. After review, I informed him that he had an inconsistent urine drug screen and is in violation of his controlled substances agreement and I will not prescribe any controlled substances. After hearing this, his wife suddenly got up and got very rude and said "what do you mean you are not gonna give him his pain meds?" She told me that he is taking the medication and to repeat the urine drug screen. She asked for the manager of her practice and left the exam room. I explained to the patient that he will have to take it up with the lab but I will not be able to give him any controlled substances. I left and informed my manager Miel Mock of our discussion and asked her to take over the matter.    Martin Ricketson Asad A. Faylene KurtzShah [facility] 09/24/2015 9:37 AM

## 2015-09-27 ENCOUNTER — Ambulatory Visit (INDEPENDENT_AMBULATORY_CARE_PROVIDER_SITE_OTHER): Payer: Medicare Other | Admitting: Family Medicine

## 2015-09-27 ENCOUNTER — Encounter: Payer: Self-pay | Admitting: Family Medicine

## 2015-09-27 VITALS — BP 120/84 | HR 67 | Temp 98.7°F | Ht 69.75 in | Wt 128.5 lb

## 2015-09-27 DIAGNOSIS — I10 Essential (primary) hypertension: Secondary | ICD-10-CM

## 2015-09-27 DIAGNOSIS — E785 Hyperlipidemia, unspecified: Secondary | ICD-10-CM | POA: Diagnosis not present

## 2015-09-27 DIAGNOSIS — I2510 Atherosclerotic heart disease of native coronary artery without angina pectoris: Secondary | ICD-10-CM | POA: Diagnosis not present

## 2015-09-27 DIAGNOSIS — M545 Low back pain: Secondary | ICD-10-CM

## 2015-09-27 DIAGNOSIS — I5022 Chronic systolic (congestive) heart failure: Secondary | ICD-10-CM

## 2015-09-27 DIAGNOSIS — G8929 Other chronic pain: Secondary | ICD-10-CM

## 2015-09-27 MED ORDER — ATORVASTATIN CALCIUM 80 MG PO TABS: 80.0000 mg | ORAL_TABLET | Freq: Every day | ORAL | Status: DC

## 2015-09-27 MED ORDER — PANTOPRAZOLE SODIUM 40 MG PO TBEC: 40.0000 mg | DELAYED_RELEASE_TABLET | Freq: Every day | ORAL | Status: DC

## 2015-09-27 MED ORDER — CLOPIDOGREL BISULFATE 75 MG PO TABS: ORAL_TABLET | ORAL | Status: DC

## 2015-09-27 MED ORDER — ZOLPIDEM TARTRATE 10 MG PO TABS: 10.0000 mg | ORAL_TABLET | Freq: Every day | ORAL | Status: DC

## 2015-09-27 MED ORDER — LISINOPRIL 5 MG PO TABS: ORAL_TABLET | ORAL | Status: DC

## 2015-09-27 MED ORDER — METOPROLOL SUCCINATE ER 25 MG PO TB24: 25.0000 mg | ORAL_TABLET | Freq: Every day | ORAL | Status: DC

## 2015-09-27 NOTE — Patient Instructions (Signed)
Continue your current medications.  We will call with the referral to pain management.  Follow up in 3 months.  Take care  Dr. Adriana Simasook

## 2015-09-28 ENCOUNTER — Encounter: Payer: Self-pay | Admitting: Family Medicine

## 2015-09-28 DIAGNOSIS — I10 Essential (primary) hypertension: Secondary | ICD-10-CM | POA: Insufficient documentation

## 2015-09-28 NOTE — Progress Notes (Signed)
Subjective:  Patient ID: Martin Hernandez, male    DOB: 04/05/1956  Age: 60 y.o. MRN: [id number]  CC: Establish care, requesting narcotics  HPI Martin Hernandez is a 60 y.o. male presents to the clinic today to establish care.  Low back pain  Patient reports longstanding low back pain.  He reports numbness/tingling of his legs/feet.  He has been treated with Narcotics by PCP/PCP office.  He has recently been seen and UDS was obtained and did not reveal metabolites/evidence of use of Percocet.  He presents today requesting this for his pain.  CAD, CHF/Ischemic cardiomyopathy  Stable.  Followed by cardiology (has an extensive history).  Endorses compliance with Lisinopril, Lipitor, Aspirin, Plavix, Metoprolol.  Hyperlipidemia  Well controlled on Lipitor.  At goal.  HTN  Stable & at goal on Lisinpril & Metoprolol.  PMH, Surgical Hx, Family Hx, Social History reviewed and updated as below.  Past Medical History  Diagnosis Date  . Depression   . Hyperlipidemia   . GERD (gastroesophageal reflux disease)   . Allergy   . Hypertension   . Frequent headaches   . CAD (coronary artery disease)   . PAD (peripheral artery disease) (HCC)   . Tobacco abuse    Past Surgical History  Procedure Laterality Date  . Percutaneous coronary stent intervention (pci-s)    . Tonsillectomy    . Stomach surgery     Family History  Problem Relation Age of Onset  . Diabetes Mother   . Heart disease Mother   . Hypertension Mother   . Stroke Mother   . Diabetes Sister   . Heart disease Sister   . Prostate cancer Father    Social History  Substance Use Topics  . Smoking status: Current Every Day Smoker -- 1.30 packs/day    Types: Cigarettes  . Smokeless tobacco: Never Used  . Alcohol Use: 3.6 - 4.8 oz/week    0 Standard drinks or equivalent, 6-8 Cans of beer per week   Review of Systems  Constitutional: Positive for unexpected weight change.  HENT: Positive for  tinnitus.   Eyes: Positive for visual disturbance.  Respiratory: Positive for shortness of breath.   Cardiovascular: Positive for chest pain and palpitations.  Gastrointestinal: Positive for abdominal pain.  Genitourinary:       Sexual difficulty (ED).  Musculoskeletal: Positive for myalgias and back pain.  Neurological: Positive for weakness.  All other systems reviewed and are negative.  Objective:   Today's Vitals: BP 120/84 mmHg  Pulse 67  Temp(Src) 98.7 F (37.1 C) (Oral)  Ht 5' 9.75" (1.772 m)  Wt 128 lb 8 oz (58.287 kg)  BMI 18.56 kg/m2  SpO2 97%  Physical Exam  Constitutional: He is oriented to person, place, and time.  Thin male in NAD.  HENT:  Head: Normocephalic and atraumatic.  Mouth/Throat: Oropharynx is clear and moist.  Eyes: Conjunctivae are normal. No scleral icterus.  Neck: Neck supple.  Cardiovascular: Normal rate, regular rhythm and intact distal pulses.   Pulmonary/Chest: Effort normal. He has no wheezes.  Abdominal: Soft. He exhibits no distension. There is no tenderness. There is no rebound and no guarding.  Musculoskeletal:  Decreased ROM of low back.  Lymphadenopathy:    He has no cervical adenopathy.  Neurological: He is alert and oriented to person, place, and time.  Skin: Skin is warm and dry.  Psychiatric: He has a normal mood and affect.  Vitals reviewed.  Assessment & Plan:   Problem List Items Addressed  This Visit    CAD (coronary artery disease)    Stable. Continue Aspirin, Plavix, Lisinopril, Metoprolol.      Relevant Medications   aspirin 81 MG tablet   atorvastatin (LIPITOR) 80 MG tablet   lisinopril (PRINIVIL,ZESTRIL) 5 MG tablet   metoprolol succinate (TOPROL-XL) 25 MG 24 hr tablet   Heart failure (HCC)    Euvolemic today. Last documented EF was 20%. Needs close cardiology follow up.      Relevant Medications   aspirin 81 MG tablet   atorvastatin (LIPITOR) 80 MG tablet   lisinopril (PRINIVIL,ZESTRIL) 5 MG tablet    metoprolol succinate (TOPROL-XL) 25 MG 24 hr tablet   Chronic low back pain - Primary    Has had recent xray with just mild disc narrowing.  Requesting narcotics and has had recent UDS that did not reveal metabolites of pain medication. Patient adamant that he is taking and that the test is wrong. Discussed this with patient and his wife. Will not prescribe due to the above.  Sending to pain management. **Of note has not had MRI or other work up.       Relevant Medications   aspirin 81 MG tablet   Other Relevant Orders   Ambulatory referral to Pain Clinic   HLD (hyperlipidemia)    Well controlled. Continue Lipitor.      Relevant Medications   aspirin 81 MG tablet   atorvastatin (LIPITOR) 80 MG tablet   lisinopril (PRINIVIL,ZESTRIL) 5 MG tablet   metoprolol succinate (TOPROL-XL) 25 MG 24 hr tablet   Essential hypertension    Stable. Continue Lisinopril, Metoprolol.       Relevant Medications   aspirin 81 MG tablet   atorvastatin (LIPITOR) 80 MG tablet   lisinopril (PRINIVIL,ZESTRIL) 5 MG tablet   metoprolol succinate (TOPROL-XL) 25 MG 24 hr tablet      Outpatient Encounter Prescriptions as of 09/27/2015  Medication Sig  . aspirin 81 MG tablet Take 81 mg by mouth daily.  Marland Kitchen atorvastatin (LIPITOR) 80 MG tablet Take 1 tablet (80 mg total) by mouth daily at 6 PM.  . clopidogrel (PLAVIX) 75 MG tablet TAKE ONE (1) TABLET EACH DAY  . cyanocobalamin 100 MCG tablet Take 100 mcg by mouth daily.  . folic acid (FOLVITE) 1 MG tablet Take 1 mg by mouth daily.  Marland Kitchen lisinopril (PRINIVIL,ZESTRIL) 5 MG tablet TAKE ONE (1) TABLET EACH DAY  . metoprolol succinate (TOPROL-XL) 25 MG 24 hr tablet Take 1 tablet (25 mg total) by mouth daily.  . mirtazapine (REMERON) 30 MG tablet TAKE ONE (1) TABLET BY MOUTH EVERY DAY  . nitroGLYCERIN (NITROSTAT) 0.4 MG SL tablet Place 1 tablet (0.4 mg total) under the tongue every 5 (five) minutes as needed for chest pain.  Marland Kitchen oxyCODONE-acetaminophen (PERCOCET)  10-325 MG tablet Take 1 tablet by mouth every 8 (eight) hours as needed for pain.  . pantoprazole (PROTONIX) 40 MG tablet Take 1 tablet (40 mg total) by mouth daily.  Marland Kitchen pyridoxine (B-6) 100 MG tablet Take 100 mg by mouth daily.  Marland Kitchen thiamine (VITAMIN B-1) 100 MG tablet Take 100 mg by mouth daily.  . traZODone (DESYREL) 100 MG tablet TAKE ONE TABLET EVERY EVENING FOR SLEEP  . zolpidem (AMBIEN) 10 MG tablet Take 1 tablet (10 mg total) by mouth at bedtime.  . [DISCONTINUED] atorvastatin (LIPITOR) 80 MG tablet Take 1 tablet (80 mg total) by mouth daily at 6 PM.  . [DISCONTINUED] clopidogrel (PLAVIX) 75 MG tablet TAKE ONE (1) TABLET EACH DAY  . [  DISCONTINUED] ibuprofen (ADVIL,MOTRIN) 200 MG tablet Take 200 mg by mouth every 6 (six) hours as needed.  . [DISCONTINUED] lisinopril (PRINIVIL,ZESTRIL) 5 MG tablet TAKE ONE (1) TABLET EACH DAY  . [DISCONTINUED] metoprolol succinate (TOPROL-XL) 25 MG 24 hr tablet   . [DISCONTINUED] pantoprazole (PROTONIX) 40 MG tablet   . [DISCONTINUED] zolpidem (AMBIEN) 10 MG tablet TAKE ONE TABLET AT BEDTIME  . [DISCONTINUED] doxycycline (VIBRAMYCIN) 100 MG capsule TAKE ONE CAPSULE BY MOUTH TWICE A DAY  . [DISCONTINUED] DULoxetine (CYMBALTA) 30 MG capsule TAKE ONE CAPSULE BY MOUTH DAILY AND THENINCREASE TO TWO CAPSULES DAILY AS NEEDED  . [DISCONTINUED] omeprazole (PRILOSEC) 20 MG capsule   . [DISCONTINUED] pantoprazole (PROTONIX) 40 MG tablet TAKE ONE (1) TABLET EACH DAY  . [DISCONTINUED] tamsulosin (FLOMAX) 0.4 MG CAPS capsule Take 1 capsule (0.4 mg total) by mouth daily.   No facility-administered encounter medications on file as of 09/27/2015.   Follow-up: 3 months  Jayceon Troy Adriana Simas DO [facility]

## 2015-09-29 ENCOUNTER — Telehealth: Payer: Self-pay | Admitting: Family Medicine

## 2015-09-29 ENCOUNTER — Encounter: Payer: Self-pay | Admitting: Family Medicine

## 2015-09-29 ENCOUNTER — Ambulatory Visit: Payer: Medicare Other | Admitting: Family Medicine

## 2015-09-29 NOTE — Assessment & Plan Note (Signed)
Stable. Continue Aspirin, Plavix, Lisinopril, Metoprolol.

## 2015-09-29 NOTE — Assessment & Plan Note (Signed)
Stable. Continue Lisinopril, Metoprolol.

## 2015-09-29 NOTE — Assessment & Plan Note (Signed)
Euvolemic today. Last documented EF was 20%. Needs close cardiology follow up.

## 2015-09-29 NOTE — Telephone Encounter (Signed)
Patient was seen in our office on 09/24/15 by Dr. Sherryll BurgerShah.  Dr. Sherryll BurgerShah was in the middle of explaining patient's urine drug screen results and that he would no longer prescribed controlled medications to him anymore due to the results of the urine drug screen.  Dr. Sherryll BurgerShah informed me that the patient's wife got up yelling in a threatening way that the test results were wrong and at that point Dr. Sherryll BurgerShah told the patient and his wife that he, the patient, would be discharged from the practice.  Dr. Sherryll BurgerShah came and got me because the wife kept yelling through the office.  I took the patient's wife into another exam room and to calm her down and just explain the reason for the dismissal from the practice.  The wife kept going on and on about how wrong Dr. Sherryll BurgerShah was and that she was going to report him to the medical board.  I told the wife that she needed to find her husband another PCP and she asked if Dr. Sherryll BurgerShah would prescribed her husband a 30 day supply of his pain and sleep medication until he could get in with another practice.  I informed wife that Dr. Sherryll BurgerShah stated that he would not prescribed the medications anymore due to the test results.  Both patient and his wife left the office.  Around 3:24pm the patient's wife called back and again she wanted me to apologize to Dr. Sherryll BurgerShah for her yelling at him but she said that she was just trying to take up for her husband and she wanted to know if he would prescribed the medication and I told her again that he would not.  And she stated how wrong it was and that Dr. Sherryll BurgerShah needed to be reported to the medical board.  After the phone call I notified [facility] Risk Management Department so that they could follow up with the patient.

## 2015-09-29 NOTE — Assessment & Plan Note (Signed)
Well-controlled.  Continue Lipitor. 

## 2015-09-29 NOTE — Assessment & Plan Note (Signed)
Has had recent xray with just mild disc narrowing.  Requesting narcotics and has had recent UDS that did not reveal metabolites of pain medication. Patient adamant that he is taking and that the test is wrong. Discussed this with patient and his wife. Will not prescribe due to the above.  Sending to pain management. **Of note has not had MRI or other work up.

## 2015-10-06 ENCOUNTER — Ambulatory Visit: Payer: Medicare Other | Admitting: Family Medicine

## 2015-11-03 ENCOUNTER — Ambulatory Visit (INDEPENDENT_AMBULATORY_CARE_PROVIDER_SITE_OTHER): Payer: Medicare Other

## 2015-11-03 VITALS — BP 118/64 | HR 70 | Temp 98.4°F | Resp 14 | Ht 70.0 in | Wt 128.8 lb

## 2015-11-03 DIAGNOSIS — I200 Unstable angina: Secondary | ICD-10-CM

## 2015-11-03 DIAGNOSIS — Z0000 Encounter for general adult medical examination without abnormal findings: Secondary | ICD-10-CM

## 2015-11-03 HISTORY — DX: Unstable angina: I20.0

## 2015-11-03 NOTE — Progress Notes (Signed)
Subjective:   Martin Hernandez is a 60 y.o. male who presents for an Initial Medicare Annual Wellness Visit.  Review of Systems  No ROS.  Medicare Wellness Visit.  Cardiac Risk Factors include: advanced age (>54men, >104 women);male gender;smoking/ tobacco exposure;hypertension    Objective:    Today's Vitals   11/03/15 1020  BP: 118/64  Pulse: 70  Resp: 14  Temp: 98.4 F (36.9 C)  TempSrc: Oral  SpO2: 95%  Weight: 128 lb 12.8 oz (58.4 kg)  Height: 5\' 10"  (1.778 m)  PainSc: 7    Body mass index is 18.48 kg/m.  Current Medications (verified) Outpatient Encounter Prescriptions as of 11/03/2015  Medication Sig  . aspirin 81 MG tablet Take 81 mg by mouth daily.  Marland Kitchen atorvastatin (LIPITOR) 80 MG tablet Take 1 tablet (80 mg total) by mouth daily at 6 PM.  . clopidogrel (PLAVIX) 75 MG tablet TAKE ONE (1) TABLET EACH DAY  . cyanocobalamin 100 MCG tablet Take 100 mcg by mouth daily.  . folic acid (FOLVITE) 1 MG tablet Take 1 mg by mouth daily.  Marland Kitchen lisinopril (PRINIVIL,ZESTRIL) 5 MG tablet TAKE ONE (1) TABLET EACH DAY  . metoprolol succinate (TOPROL-XL) 25 MG 24 hr tablet Take 1 tablet (25 mg total) by mouth daily.  . mirtazapine (REMERON) 30 MG tablet TAKE ONE (1) TABLET BY MOUTH EVERY DAY  . nitroGLYCERIN (NITROSTAT) 0.4 MG SL tablet Place 1 tablet (0.4 mg total) under the tongue every 5 (five) minutes as needed for chest pain.  Marland Kitchen oxyCODONE-acetaminophen (PERCOCET) 10-325 MG tablet Take 1 tablet by mouth every 8 (eight) hours as needed for pain.  . pantoprazole (PROTONIX) 40 MG tablet Take 1 tablet (40 mg total) by mouth daily.  Marland Kitchen pyridoxine (B-6) 100 MG tablet Take 100 mg by mouth daily.  Marland Kitchen thiamine (VITAMIN B-1) 100 MG tablet Take 100 mg by mouth daily.  . traZODone (DESYREL) 100 MG tablet TAKE ONE TABLET EVERY EVENING FOR SLEEP  . zolpidem (AMBIEN) 10 MG tablet Take 1 tablet (10 mg total) by mouth at bedtime.   No facility-administered encounter medications on file as of  11/03/2015.     Allergies (verified) Review of patient's allergies indicates no known allergies.   History: Past Medical History:  Diagnosis Date  . Allergy   . CAD (coronary artery disease)   . Depression   . Frequent headaches   . GERD (gastroesophageal reflux disease)   . Hyperlipidemia   . Hypertension   . PAD (peripheral artery disease) (HCC)   . Tobacco abuse    Past Surgical History:  Procedure Laterality Date  . PERCUTANEOUS CORONARY STENT INTERVENTION (PCI-S)    . STOMACH SURGERY    . TONSILLECTOMY     Family History  Problem Relation Age of Onset  . Diabetes Mother   . Heart disease Mother   . Hypertension Mother   . Stroke Mother   . Prostate cancer Father   . Diabetes Sister   . Heart disease Sister    Social History   Occupational History  . Not on file.   Social History Main Topics  . Smoking status: Current Every Day Smoker    Packs/day: 1.30    Types: Cigarettes  . Smokeless tobacco: Never Used  . Alcohol use 3.6 - 4.8 oz/week    6 - 8 Cans of beer per week  . Drug use: No  . Sexual activity: Not Currently   Tobacco Counseling Ready to quit: No Counseling given: Yes  Activities of Daily Living In your present state of health, do you have any difficulty performing the following activities: 11/03/2015 09/24/2015  Hearing? N N  Vision? N N  Difficulty concentrating or making decisions? N N  Walking or climbing stairs? Y Y  Dressing or bathing? N N  Doing errands, shopping? N N  Preparing Food and eating ? N -  Using the Toilet? N -  In the past six months, have you accidently leaked urine? N -  Do you have problems with loss of bowel control? N -  Managing your Medications? N -  Managing your Finances? N -  Housekeeping or managing your Housekeeping? Y -  Some recent data might be hidden    Immunizations and Health Maintenance  There is no immunization history on file for this patient. Health Maintenance Due  Topic Date Due  .  Hepatitis C Screening  1956-02-22  . HIV Screening  01/02/1970  . TETANUS/TDAP  01/02/1974  . COLONOSCOPY  01/02/2005  . ZOSTAVAX  01/03/2015    Patient Care Team: Tommie Sams, DO as PCP - General (Family Medicine)  Indicate any recent Medical Services you may have received from other than [facility] providers in the past year (date may be approximate).    Assessment:   This is a routine wellness examination for Martin Hernandez.  The goal of the wellness visit is to assist the patient how to close the gaps in care and create a preventative care plan for the patient.   Taking calcium VIT D as appropriate/Osteoporosis risk reviewed.  Medications reviewed; taking without issues or barriers.  Safety issues reviewed; smoke detectors in the home. Firearms locked in a safe place in the home. Wears seatbelts when driving or riding with others. No violence in the home.  No identified risk were noted; The patient was oriented x 3; appropriate in dress and manner and no objective failures at ADL's or IADL's.   Hepatitis C screening, HIV screening postponed per patient request.  Follow up with PCP.  Colonoscopy deferred.  COLOguard Agricultural engineer provided.    ZOSTAVAX and TDAP vaccine postponed for follow up with insurance.    Hearing/Vision screen Hearing Screening Comments: Passes the whisper test. Vision Screening Comments: No current ophthalmologist.  Does not wear glasses. Encouraged to make an eye exam as soon as possible. Last OV 2015.  Dietary issues and exercise activities discussed: Current Exercise Habits: The patient does not participate in regular exercise at present  Goals    . Healthy Lifestyle          Stay hydrated and drink plenty of fluids. Healthy foods.  Stretch! Try to exercise.  Chair exercises recommended with SOB.  Start physical therapy next month.       Depression Screen PHQ 2/9 Scores 11/03/2015 09/24/2015 08/24/2015 07/29/2015  PHQ - 2 Score 0 0 0 0      Fall Risk Fall Risk  11/03/2015 09/24/2015 08/24/2015 07/29/2015 02/26/2015  Falls in the past year? No No No No No  Number falls in past yr: - - - - -  Injury with Fall? - - - - -  Risk for fall due to : - - - - -    Cognitive Function: MMSE - Mini Mental State Exam 11/03/2015  Orientation to time 5  Orientation to Place 5  Registration 3  Attention/ Calculation 5  Recall 3  Language- name 2 objects 2  Language- repeat 1  Language- follow 3 step command 3  Language-  read & follow direction 1  Write a sentence 1  Copy design 1  Total score 30    Screening Tests Health Maintenance  Topic Date Due  . Hepatitis C Screening  Oct 14, 1955  . HIV Screening  01/02/1970  . TETANUS/TDAP  01/02/1974  . COLONOSCOPY  01/02/2005  . ZOSTAVAX  01/03/2015  . INFLUENZA VACCINE  11/09/2015        Plan:   End of life planning; Advance aging; Advanced directives discussed. HCPOA/Living Will short forms deferred at this time.  Follow up with PCP.  During the course of the visit Barnes was educated and counseled about the following appropriate screening and preventive services:   Vaccines to include Pneumoccal, Influenza, Hepatitis B, Td, Zostavax, HCV  Electrocardiogram  Colorectal cancer screening  Cardiovascular disease screening  Diabetes screening  Glaucoma screening  Nutrition counseling  Prostate cancer screening  Smoking cessation counseling  Patient Instructions (the written plan) were given to the patient.   Ashok Pall, LPN   [id number]

## 2015-11-03 NOTE — Patient Instructions (Addendum)
Martin Hernandez , Thank you for taking time to come for your Medicare Wellness Visit. I appreciate your ongoing commitment to your health goals. Please review the following plan we discussed and let me know if I can assist you in the future.   Follow up with Dr. Adriana Simas as needed.   This is a list of the screening recommended for you and due dates:  Health Maintenance  Topic Date Due  .  Hepatitis C: One time screening is recommended by Center for Disease Control  (CDC) for  adults born from [birth year] through 1966.   1955-08-23  . HIV Screening  01/02/1970  . Tetanus Vaccine  01/02/1974  . Colon Cancer Screening  01/02/2005  . Shingles Vaccine  01/03/2015  . Flu Shot  11/09/2015   Smoking Cessation, Tips for Success If you are ready to quit smoking, congratulations! You have chosen to help yourself be healthier. Cigarettes bring nicotine, tar, carbon monoxide, and other irritants into your body. Your lungs, heart, and blood vessels will be able to work better without these poisons. There are many different ways to quit smoking. Nicotine gum, nicotine patches, a nicotine inhaler, or nicotine nasal spray can help with physical craving. Hypnosis, support groups, and medicines help break the habit of smoking. WHAT THINGS CAN I DO TO MAKE QUITTING EASIER?  Here are some tips to help you quit for good:  Pick a date when you will quit smoking completely. Tell all of your friends and family about your plan to quit on that date.  Do not try to slowly cut down on the number of cigarettes you are smoking. Pick a quit date and quit smoking completely starting on that day.  Throw away all cigarettes.   Clean and remove all ashtrays from your home, work, and car.  On a card, write down your reasons for quitting. Carry the card with you and read it when you get the urge to smoke.  Cleanse your body of nicotine. Drink enough water and fluids to keep your urine clear or pale yellow. Do this after quitting to  flush the nicotine from your body.  Learn to predict your moods. Do not let a bad situation be your excuse to have a cigarette. Some situations in your life might tempt you into wanting a cigarette.  Never have "just one" cigarette. It leads to wanting another and another. Remind yourself of your decision to quit.  Change habits associated with smoking. If you smoked while driving or when feeling stressed, try other activities to replace smoking. Stand up when drinking your coffee. Brush your teeth after eating. Sit in a different chair when you read the paper. Avoid alcohol while trying to quit, and try to drink fewer caffeinated beverages. Alcohol and caffeine may urge you to smoke.  Avoid foods and drinks that can trigger a desire to smoke, such as sugary or spicy foods and alcohol.  Ask people who smoke not to smoke around you.  Have something planned to do right after eating or having a cup of coffee. For example, plan to take a walk or exercise.  Try a relaxation exercise to calm you down and decrease your stress. Remember, you may be tense and nervous for the first 2 weeks after you quit, but this will pass.  Find new activities to keep your hands busy. Play with a pen, coin, or rubber band. Doodle or draw things on paper.  Brush your teeth right after eating. This will help cut down  on the craving for the taste of tobacco after meals. You can also try mouthwash.   Use oral substitutes in place of cigarettes. Try using lemon drops, carrots, cinnamon sticks, or chewing gum. Keep them handy so they are available when you have the urge to smoke.  When you have the urge to smoke, try deep breathing.  Designate your home as a nonsmoking area.  If you are a heavy smoker, ask your health care provider about a prescription for nicotine chewing gum. It can ease your withdrawal from nicotine.  Reward yourself. Set aside the cigarette money you save and buy yourself something nice.  Look  for support from others. Join a support group or smoking cessation program. Ask someone at home or at work to help you with your plan to quit smoking.  Always ask yourself, "Do I need this cigarette or is this just a reflex?" Tell yourself, "Today, I choose not to smoke," or "I do not want to smoke." You are reminding yourself of your decision to quit.  Do not replace cigarette smoking with electronic cigarettes (commonly called e-cigarettes). The safety of e-cigarettes is unknown, and some may contain harmful chemicals.  If you relapse, do not give up! Plan ahead and think about what you will do the next time you get the urge to smoke. HOW WILL I FEEL WHEN I QUIT SMOKING? You may have symptoms of withdrawal because your body is used to nicotine (the addictive substance in cigarettes). You may crave cigarettes, be irritable, feel very hungry, cough often, get headaches, or have difficulty concentrating. The withdrawal symptoms are only temporary. They are strongest when you first quit but will go away within 10-14 days. When withdrawal symptoms occur, stay in control. Think about your reasons for quitting. Remind yourself that these are signs that your body is healing and getting used to being without cigarettes. Remember that withdrawal symptoms are easier to treat than the major diseases that smoking can cause.  Even after the withdrawal is over, expect periodic urges to smoke. However, these cravings are generally short lived and will go away whether you smoke or not. Do not smoke! WHAT RESOURCES ARE AVAILABLE TO HELP ME QUIT SMOKING? Your health care provider can direct you to community resources or hospitals for support, which may include:  Group support.  Education.  Hypnosis.  Therapy.   This information is not intended to replace advice given to you by your health care provider. Make sure you discuss any questions you have with your health care provider.   Document Released: 12/24/2003  Document Revised: 04/17/2014 Document Reviewed: 09/12/2012 Elsevier Interactive Patient Education 2016 [employer].   You Can Quit Smoking If you are ready to quit smoking or are thinking about it, congratulations! You have chosen to help yourself be healthier and live longer! There are lots of different ways to quit smoking. Nicotine gum, nicotine patches, a nicotine inhaler, or nicotine nasal spray can help with physical craving. Hypnosis, support groups, and medicines help break the habit of smoking. TIPS TO GET OFF AND STAY OFF CIGARETTES  Learn to predict your moods. Do not let a bad situation be your excuse to have a cigarette. Some situations in your life might tempt you to have a cigarette.  Ask friends and co-workers not to smoke around you.  Make your home smoke-free.  Never have "just one" cigarette. It leads to wanting another and another. Remind yourself of your decision to quit.  On a card, make a  list of your reasons for not smoking. Read it at least the same number of times a day as you have a cigarette. Tell yourself everyday, "I do not want to smoke. I choose not to smoke."  Ask someone at home or work to help you with your plan to quit smoking.  Have something planned after you eat or have a cup of coffee. Take a walk or get other exercise to perk you up. This will help to keep you from overeating.  Try a relaxation exercise to calm you down and decrease your stress. Remember, you may be tense and nervous the first two weeks after you quit. This will pass.  Find new activities to keep your hands busy. Play with a pen, coin, or rubber band. Doodle or draw things on paper.  Brush your teeth right after eating. This will help cut down the craving for the taste of tobacco after meals. You can try mouthwash too.  Try gum, breath mints, or diet candy to keep something in your mouth. IF YOU SMOKE AND WANT TO QUIT:  Do not stock up on cigarettes. Never buy a carton. Wait  until one pack is finished before you buy another.  Never carry cigarettes with you at work or at home.  Keep cigarettes as far away from you as possible. Leave them with someone else.  Never carry matches or a lighter with you.  Ask yourself, "Do I need this cigarette or is this just a reflex?"  Bet with someone that you can quit. Put cigarette money in a piggy bank every morning. If you smoke, you give up the money. If you do not smoke, by the end of the week, you keep the money.  Keep trying. It takes 21 days to change a habit!  Talk to your doctor about using medicines to help you quit. These include nicotine replacement gum, lozenges, or skin patches.   This information is not intended to replace advice given to you by your health care provider. Make sure you discuss any questions you have with your health care provider.   Document Released: 01/21/2009 Document Revised: 06/19/2011 Document Reviewed: 01/21/2009 Elsevier Interactive Patient Education [employer]

## 2015-11-03 NOTE — Progress Notes (Signed)
Care was provided under my supervision. I agree with the management as indicated in the note.  Kassie Keng DO  

## 2015-11-09 DIAGNOSIS — G894 Chronic pain syndrome: Secondary | ICD-10-CM | POA: Diagnosis not present

## 2015-11-09 DIAGNOSIS — Z79891 Long term (current) use of opiate analgesic: Secondary | ICD-10-CM | POA: Diagnosis not present

## 2015-11-09 DIAGNOSIS — Z79899 Other long term (current) drug therapy: Secondary | ICD-10-CM | POA: Diagnosis not present

## 2015-11-09 DIAGNOSIS — M1288 Other specific arthropathies, not elsewhere classified, other specified site: Secondary | ICD-10-CM | POA: Diagnosis not present

## 2015-11-11 ENCOUNTER — Other Ambulatory Visit: Payer: Self-pay | Admitting: Family Medicine

## 2015-11-17 ENCOUNTER — Telehealth: Payer: Self-pay | Admitting: Family Medicine

## 2015-11-17 DIAGNOSIS — G894 Chronic pain syndrome: Secondary | ICD-10-CM | POA: Diagnosis not present

## 2015-11-17 DIAGNOSIS — Z79899 Other long term (current) drug therapy: Secondary | ICD-10-CM | POA: Diagnosis not present

## 2015-11-17 DIAGNOSIS — Z79891 Long term (current) use of opiate analgesic: Secondary | ICD-10-CM | POA: Diagnosis not present

## 2015-11-17 DIAGNOSIS — M1288 Other specific arthropathies, not elsewhere classified, other specified site: Secondary | ICD-10-CM | POA: Diagnosis not present

## 2015-11-17 DIAGNOSIS — M545 Low back pain: Secondary | ICD-10-CM | POA: Diagnosis not present

## 2015-11-17 NOTE — Telephone Encounter (Signed)
Left a detailed message for Martin Hernandez per her request with information. thanks

## 2015-11-17 NOTE — Telephone Encounter (Signed)
HC will assist, thanks

## 2015-11-17 NOTE — Telephone Encounter (Signed)
HFU, Dx was Ischemic Cardiomyopathy. Pt is scheduled for tomorrow. Pt insurance called to follow up on if pt was scheduled to come in. Sh[facility]umpsJessica [facility] was admitted in the hospital over night. Thank you!

## 2015-11-17 NOTE — Telephone Encounter (Signed)
Martin Hernandez [phone] [facility] needing to know if pt was ever dx with congestive heart failure? Voicemail can be left if she's not available. Thank you!

## 2015-11-18 ENCOUNTER — Encounter: Payer: Self-pay | Admitting: Family Medicine

## 2015-11-18 ENCOUNTER — Telehealth: Payer: Self-pay | Admitting: Family Medicine

## 2015-11-18 ENCOUNTER — Ambulatory Visit (INDEPENDENT_AMBULATORY_CARE_PROVIDER_SITE_OTHER): Payer: Medicare Other | Admitting: Family Medicine

## 2015-11-18 DIAGNOSIS — R079 Chest pain, unspecified: Secondary | ICD-10-CM | POA: Diagnosis not present

## 2015-11-18 DIAGNOSIS — R630 Anorexia: Secondary | ICD-10-CM | POA: Diagnosis not present

## 2015-11-18 MED ORDER — ISOSORBIDE MONONITRATE ER 30 MG PO TB24: 30.0000 mg | ORAL_TABLET | Freq: Every day | ORAL | 3 refills | Status: DC

## 2015-11-18 NOTE — Telephone Encounter (Signed)
Spoke with patient. States he is feeling ok. Encouraged to keep scheduled appointment.

## 2015-11-18 NOTE — Progress Notes (Signed)
Subjective:  Patient ID: Martin Hernandez, male    DOB: 12-Jul-1955  Age: 60 y.o. MRN: [id number]  CC: Hospital follow up  HPI:  60 year old male with chronic pain, tobacco abuse, CAD, CHF/Ischemic cardiomyopathy, HTN, HLD, PAD presents for hospital follow up.  Patient was admitted on 7/26. He presented with chest pain. Hospital course reviewed and summarized as follows: Cardiac biomarkers were negative in the emergency room. Labs are remarkable. Patient seemed amply stable. Unchanged EKG. His AICD was interrogated. Also unremarkable. He underwent cardiac catheterization which was unchanged since his angiogram February. He was discharged home in stable condition.  Patient presents today for follow-up. Patient reports that he continues to have chest pain. He is using nitroglycerin patches frequently. He endorses compliance with his other medications. He continues to smoke. Patient reports continued issues regarding his appetite. Patient states that he's lost 2 pounds since his last visit. He continues to struggle with poor appetite. No other complaints at this time.  Social Hx   Social History   Social History  . Marital status: Married    Spouse name: N/A  . Number of children: N/A  . Years of education: N/A   Social History Main Topics  . Smoking status: Current Every Day Smoker    Packs/day: 1.30    Types: Cigarettes  . Smokeless tobacco: Never Used  . Alcohol use 3.6 - 4.8 oz/week    6 - 8 Cans of beer per week  . Drug use: No  . Sexual activity: Not Currently   Other Topics Concern  . None   Social History Narrative  . None   Review of Systems  Constitutional: Positive for appetite change.  Cardiovascular: Positive for chest pain.   Objective:  BP 107/71 (BP Location: Right Arm, Patient Position: Sitting, Cuff Size: Normal)   Pulse 81   Temp 98 F (36.7 C) (Oral)   Wt 126 lb 4 oz (57.3 kg)   SpO2 95%   BMI 18.11 kg/m   BP/Weight 11/18/2015 11/03/2015  09/27/2015  Systolic BP 107 118 120  Diastolic BP 71 64 84  Wt. (Lbs) 126.25 128.8 128.5  BMI 18.11 18.48 18.56   Physical Exam  Constitutional: He is oriented to person, place, and time.  Thin, chronically ill appearing male in NAD.  Cardiovascular: Normal rate and regular rhythm.   Pulmonary/Chest: Effort normal and breath sounds normal.  Neurological: He is alert and oriented to person, place, and time.  Psychiatric: He has a normal mood and affect.  Vitals reviewed.  Assessment & Plan:   Problem List Items Addressed This Visit    Appetite loss    Patient with poor appetite and weight loss. Weight loss secondary to underlying severe cardiac disease. Advised 3 meals daily with protein shakes in between. Advised smoking cessation.      Chest pain    New problem. Patient recently admitted for chest pain. Hospital course reviewed and summarized in history of present illness. Continues to have angina. Starting on Imdur. Patient needs close cardiology follow up. Advised smoking cessation.       Other Visit Diagnoses   None.     Meds ordered this encounter  Medications  . baclofen (LIORESAL) 10 MG tablet    Sig: Take by mouth.  . Morphine-Naltrexone (EMBEDA) 20-0.8 MG CPCR    Sig: Take by mouth.  Marland Kitchen ipratropium (ATROVENT) 0.02 % nebulizer solution    Sig: Inhale into the lungs.  Marland Kitchen DISCONTD: cyanocobalamin (TH VITAMIN B12) 100  MCG tablet    Sig: Take by mouth.  . isosorbide mononitrate (IMDUR) 30 MG 24 hr tablet    Sig: Take 1 tablet (30 mg total) by mouth daily.    Dispense:  90 tablet    Refill:  3    Follow-up: PRN  Everlene OtherJayce Tena Linebaugh DO [facility]

## 2015-11-18 NOTE — Assessment & Plan Note (Signed)
New problem. Patient recently admitted for chest pain. Hospital course reviewed and summarized in history of present illness. Continues to have angina. Starting on Imdur. Patient needs close cardiology follow up. Advised smoking cessation.

## 2015-11-18 NOTE — Patient Instructions (Signed)
Take the imdur as prescribed.  Follow up closely with cardiology.  Stop smoking.  Consider colonscopy and lung cancer screening.  Take care  Dr. Adriana Simasook

## 2015-11-18 NOTE — Progress Notes (Signed)
Pre visit review using our clinic review tool, if applicable. No additional management support is needed unless otherwise documented below in the visit note. 

## 2015-11-18 NOTE — Assessment & Plan Note (Signed)
Patient with poor appetite and weight loss. Weight loss secondary to underlying severe cardiac disease. Advised 3 meals daily with protein shakes in between. Advised smoking cessation.

## 2015-12-14 DIAGNOSIS — Z79899 Other long term (current) drug therapy: Secondary | ICD-10-CM | POA: Diagnosis not present

## 2015-12-14 DIAGNOSIS — G894 Chronic pain syndrome: Secondary | ICD-10-CM | POA: Diagnosis not present

## 2015-12-14 DIAGNOSIS — Z79891 Long term (current) use of opiate analgesic: Secondary | ICD-10-CM | POA: Diagnosis not present

## 2015-12-14 DIAGNOSIS — M1288 Other specific arthropathies, not elsewhere classified, other specified site: Secondary | ICD-10-CM | POA: Diagnosis not present

## 2015-12-29 ENCOUNTER — Ambulatory Visit: Payer: Medicare Other | Admitting: Family Medicine

## 2016-01-06 ENCOUNTER — Ambulatory Visit (INDEPENDENT_AMBULATORY_CARE_PROVIDER_SITE_OTHER): Payer: Medicare Other | Admitting: Internal Medicine

## 2016-01-06 ENCOUNTER — Encounter: Payer: Self-pay | Admitting: Internal Medicine

## 2016-01-06 ENCOUNTER — Telehealth: Payer: Self-pay | Admitting: Family Medicine

## 2016-01-06 VITALS — BP 130/78 | HR 69 | Temp 98.3°F | Wt 125.2 lb

## 2016-01-06 DIAGNOSIS — R3915 Urgency of urination: Secondary | ICD-10-CM | POA: Diagnosis not present

## 2016-01-06 DIAGNOSIS — R197 Diarrhea, unspecified: Secondary | ICD-10-CM | POA: Diagnosis not present

## 2016-01-06 DIAGNOSIS — R350 Frequency of micturition: Secondary | ICD-10-CM | POA: Diagnosis not present

## 2016-01-06 DIAGNOSIS — K219 Gastro-esophageal reflux disease without esophagitis: Secondary | ICD-10-CM

## 2016-01-06 DIAGNOSIS — R1030 Lower abdominal pain, unspecified: Secondary | ICD-10-CM

## 2016-01-06 DIAGNOSIS — R140 Abdominal distension (gaseous): Secondary | ICD-10-CM

## 2016-01-06 LAB — LIPASE: Lipase: 13 U/L (ref 11.0–59.0)

## 2016-01-06 LAB — POC URINALSYSI DIPSTICK (AUTOMATED)
Bilirubin, UA: NEGATIVE
Glucose, UA: NEGATIVE
Ketones, UA: NEGATIVE
Leukocytes, UA: NEGATIVE
NITRITE UA: NEGATIVE
PROTEIN UA: NEGATIVE
RBC UA: NEGATIVE
SPEC GRAV UA: 1.01
UROBILINOGEN UA: NEGATIVE
pH, UA: 6

## 2016-01-06 LAB — COMPREHENSIVE METABOLIC PANEL
ALBUMIN: 4 g/dL (ref 3.5–5.2)
ALK PHOS: 45 U/L (ref 39–117)
ALT: 10 U/L (ref 0–53)
AST: 15 U/L (ref 0–37)
BILIRUBIN TOTAL: 0.3 mg/dL (ref 0.2–1.2)
BUN: 14 mg/dL (ref 6–23)
CO2: 30 mEq/L (ref 19–32)
CREATININE: 0.82 mg/dL (ref 0.40–1.50)
Calcium: 9.1 mg/dL (ref 8.4–10.5)
Chloride: 109 mEq/L (ref 96–112)
GFR: 101.52 mL/min (ref >60.00)
GLUCOSE: 55 mg/dL — AB (ref 70–99)
Potassium: 3.7 mEq/L (ref 3.5–5.1)
SODIUM: 144 meq/L (ref 135–145)
TOTAL PROTEIN: 7.2 g/dL (ref 6.0–8.3)

## 2016-01-06 LAB — AMYLASE: AMYLASE: 39 U/L (ref 27–131)

## 2016-01-06 LAB — H. PYLORI ANTIBODY, IGG: H Pylori IgG: NEGATIVE

## 2016-01-06 MED ORDER — CIPROFLOXACIN HCL 500 MG PO TABS: 500.0000 mg | ORAL_TABLET | Freq: Two times a day (BID) | ORAL | 0 refills | Status: DC

## 2016-01-06 MED ORDER — METRONIDAZOLE 500 MG PO TABS: 500.0000 mg | ORAL_TABLET | Freq: Three times a day (TID) | ORAL | 0 refills | Status: DC

## 2016-01-06 NOTE — Telephone Encounter (Signed)
Patient Name: Martin NormanLINDSAY WATLINGTO N DOB: May 08, 1955 Initial Comment Caller has lower abd pain- going to the bathroom after 10-15 min but only drops- feeling like he has to have a BM but only gas--has 5 heart attacks in the past and has a Visual merchandiserpace maker. Nurse Assessment Nurse: Yetta BarreJones, RN, Miranda Date/Time (Eastern Time): 01/06/2016 8:37:38 AM Confirm and document reason for call. If symptomatic, describe symptoms. You must click the next button to save text entered. ---Caller states he has been having lower abdominal pain and trouble urinating for the last week. Denies fever. Has the patient traveled out of the country within the last 30 days? ---Not Applicable Does the patient have any new or worsening symptoms? ---Yes Will a triage be completed? ---Yes Related visit to physician within the last 2 weeks? ---No Does the PT have any chronic conditions? (i.e. diabetes, asthma, etc.) ---Yes List chronic conditions. ---High Cholesterol, Back pain, HTN Is this a behavioral health or substance abuse call? ---No Guidelines Guideline Title Affirmed Question Affirmed Notes Urinary Symptoms Side (flank) or lower back pain present Final Disposition User See Physician within 24 Hours Yetta BarreJones, Charity fundraiserN, Miranda Comments No appt available at primary office, app[facility] with Nicki Reaperegina Baity, NP at 10:15am. Referrals GO TO FACILITY OTHER - SPECIFY Disagree/Comply: Comply

## 2016-01-06 NOTE — Patient Instructions (Signed)

## 2016-01-06 NOTE — Progress Notes (Signed)
Subjective:    Patient ID: Martin Hernandez, male    DOB: 10/29/1955, 60 y.o. Geralyn Flash  MRN: [id number]  HPI  Pt presents to the clinic today with c/o lower abdominal pain, bloating, and loose stools. He reports this started 3 days ago. He describes the pain as cramping. He is passing a lot of gas. He is having multiple stools per day, normal in color, but denies blood in his stool. His appetite is poor but he denies nausea or vomiting. He does have some urinary urgency and frequency, but denies dysuria, penile discharge or testicular pain. He denies fever, chills or body aches. He has not taken anything OTC for this. He has never been told that he has a prostate problem, and is not on any diuretics. He is taking Protonix for GERD and reports the medication works well for him. He has never had a colonoscopy secondary to a heart condition.  Review of Systems      Past Medical History:  Diagnosis Date  . Allergy   . CAD (coronary artery disease)   . Depression   . Frequent headaches   . GERD (gastroesophageal reflux disease)   . Hyperlipidemia   . Hypertension   . PAD (peripheral artery disease) (HCC)   . Tobacco abuse     Current Outpatient Prescriptions  Medication Sig Dispense Refill  . aspirin 81 MG tablet Take 81 mg by mouth daily.    Marland Kitchen. atorvastatin (LIPITOR) 80 MG tablet Take 1 tablet (80 mg total) by mouth daily at 6 PM. 90 tablet 3  . baclofen (LIORESAL) 10 MG tablet Take by mouth.    . clopidogrel (PLAVIX) 75 MG tablet TAKE ONE (1) TABLET EACH DAY 90 tablet 3  . cyanocobalamin 100 MCG tablet Take 100 mcg by mouth daily.    . folic acid (FOLVITE) 1 MG tablet Take 1 mg by mouth daily.    Marland Kitchen. ipratropium (ATROVENT) 0.02 % nebulizer solution Inhale into the lungs.    . isosorbide mononitrate (IMDUR) 30 MG 24 hr tablet Take 1 tablet (30 mg total) by mouth daily. 90 tablet 3  . lisinopril (PRINIVIL,ZESTRIL) 5 MG tablet TAKE ONE (1) TABLET EACH DAY 90 tablet 3  . metoprolol succinate  (TOPROL-XL) 25 MG 24 hr tablet Take 1 tablet (25 mg total) by mouth daily. 90 tablet 3  . mirtazapine (REMERON) 30 MG tablet TAKE ONE (1) TABLET BY MOUTH EVERY DAY 30 tablet 1  . Morphine-Naltrexone (EMBEDA) 20-0.8 MG CPCR Take by mouth.    . pantoprazole (PROTONIX) 40 MG tablet Take 1 tablet (40 mg total) by mouth daily. 90 tablet 3  . pyridoxine (B-6) 100 MG tablet Take 100 mg by mouth daily.    Marland Kitchen. thiamine (VITAMIN B-1) 100 MG tablet Take 100 mg by mouth daily.    . traZODone (DESYREL) 100 MG tablet TAKE ONE TABLET EVERY EVENING FOR SLEEP 30 tablet 5  . zolpidem (AMBIEN) 10 MG tablet Take 1 tablet (10 mg total) by mouth at bedtime. 30 tablet 2   No current facility-administered medications for this visit.     No Known Allergies  Family History  Problem Relation Age of Onset  . Diabetes Mother   . Heart disease Mother   . Hypertension Mother   . Stroke Mother   . Prostate cancer Father   . Diabetes Sister   . Heart disease Sister     Social History   Social History  . Marital status: Married    Spouse  name: N/A  . Number of children: N/A  . Years of education: N/A   Occupational History  . Not on file.   Social History Main Topics  . Smoking status: Current Every Day Smoker    Packs/day: 1.30    Types: Cigarettes  . Smokeless tobacco: Never Used  . Alcohol use 3.6 - 4.8 oz/week    6 - 8 Cans of beer per week  . Drug use: No  . Sexual activity: Not Currently   Other Topics Concern  . Not on file   Social History Narrative  . No narrative on file     Constitutional: Denies fever, malaise, fatigue, headache or abrupt weight changes.  Respiratory: Denies difficulty breathing, shortness of breath, cough or sputum production.   Cardiovascular: Denies chest pain, chest tightness, palpitations or swelling in the hands or feet.  Gastrointestinal: Pt reports abdominal pain, bloating and loose stool. Denies constipation, diarrhea or blood in the stool.  GU: Pt reports  urinary urgency and frequency. Denies pain with urination, burning sensation, blood in urine, odor or discharge.  No other specific complaints in a complete review of systems (except as listed in HPI above).  Objective:   Physical Exam  BP 130/78   Pulse 69   Temp 98.3 F (36.8 C) (Oral)   Wt 125 lb 4 oz (56.8 kg)   SpO2 98%   BMI 17.97 kg/m  Wt Readings from Last 3 Encounters:  01/06/16 125 lb 4 oz (56.8 kg)  11/18/15 126 lb 4 oz (57.3 kg)  11/03/15 128 lb 12.8 oz (58.4 kg)    General: Appears his stated age, uncomfortable but in NAD. Abdomen: Lower abdomen appears bloated. Soft and tender in the bilateral lower abdomen. Hyperactive bowel sounds. No masses noted. Liver, spleen and kidneys non palpable. No CVA tenderness Rectal: He declines       Assessment & Plan:   Lower abdominal pain, bloating, loose stool:  ? Diverticulitis- no colonoscopy to review and he reports he does not have money for a CT scan Will check CMET, Lipase, Amylase and H Pylori today eRx for Cipro 500 mg BID x 7 days eRx for Flagyl 500 mg TID x 7 days 30 mg Toradol IM today If pain persist or worsens in the next 24 hours, I want to get a CT scan with contrast Return precautions discussed, to ER if worse  Urinary urgency and frequency:  Urinalysis normal ? If he has BPH but he declines exam today No treatment at this time, will monitor  Nicki Reaper, NP

## 2016-01-06 NOTE — Addendum Note (Signed)
Addended by: Roena MaladyEVONTENNO, Sirenity Shew Y on: 01/06/2016 04:49 PM   Modules accepted: Orders

## 2016-01-06 NOTE — Telephone Encounter (Signed)
FYI

## 2016-01-11 ENCOUNTER — Ambulatory Visit: Payer: Medicare Other | Admitting: Family Medicine

## 2016-01-11 DIAGNOSIS — Z79899 Other long term (current) drug therapy: Secondary | ICD-10-CM | POA: Diagnosis not present

## 2016-01-11 DIAGNOSIS — G894 Chronic pain syndrome: Secondary | ICD-10-CM | POA: Diagnosis not present

## 2016-01-11 DIAGNOSIS — M47817 Spondylosis without myelopathy or radiculopathy, lumbosacral region: Secondary | ICD-10-CM | POA: Diagnosis not present

## 2016-01-11 DIAGNOSIS — M1288 Other specific arthropathies, not elsewhere classified, other specified site: Secondary | ICD-10-CM | POA: Diagnosis not present

## 2016-01-11 DIAGNOSIS — Z79891 Long term (current) use of opiate analgesic: Secondary | ICD-10-CM | POA: Diagnosis not present

## 2016-01-17 DIAGNOSIS — I255 Ischemic cardiomyopathy: Secondary | ICD-10-CM | POA: Diagnosis not present

## 2016-01-17 DIAGNOSIS — I5022 Chronic systolic (congestive) heart failure: Secondary | ICD-10-CM | POA: Diagnosis not present

## 2016-01-17 DIAGNOSIS — I25118 Atherosclerotic heart disease of native coronary artery with other forms of angina pectoris: Secondary | ICD-10-CM | POA: Diagnosis not present

## 2016-01-17 DIAGNOSIS — R1084 Generalized abdominal pain: Secondary | ICD-10-CM | POA: Diagnosis not present

## 2016-01-18 ENCOUNTER — Other Ambulatory Visit: Payer: Self-pay | Admitting: Family Medicine

## 2016-01-18 MED ORDER — ZOLPIDEM TARTRATE 10 MG PO TABS
10.0000 mg | ORAL_TABLET | Freq: Every day | ORAL | 2 refills | Status: DC
Start: 2016-01-18 — End: 2016-05-12

## 2016-01-18 NOTE — Telephone Encounter (Signed)
Refilled 09/27/15. Pt last seen 11/18/15. Please advise?

## 2016-01-18 NOTE — Telephone Encounter (Signed)
faxed

## 2016-02-09 ENCOUNTER — Other Ambulatory Visit: Payer: Self-pay | Admitting: Family Medicine

## 2016-02-09 ENCOUNTER — Ambulatory Visit (INDEPENDENT_AMBULATORY_CARE_PROVIDER_SITE_OTHER): Payer: Medicare Other | Admitting: Family Medicine

## 2016-02-09 ENCOUNTER — Encounter: Payer: Self-pay | Admitting: Family Medicine

## 2016-02-09 VITALS — BP 108/76 | HR 78 | Temp 97.6°F | Resp 14 | Wt 123.0 lb

## 2016-02-09 DIAGNOSIS — R059 Cough, unspecified: Secondary | ICD-10-CM | POA: Insufficient documentation

## 2016-02-09 DIAGNOSIS — R05 Cough: Secondary | ICD-10-CM | POA: Diagnosis not present

## 2016-02-09 DIAGNOSIS — E785 Hyperlipidemia, unspecified: Secondary | ICD-10-CM | POA: Diagnosis not present

## 2016-02-09 DIAGNOSIS — I10 Essential (primary) hypertension: Secondary | ICD-10-CM

## 2016-02-09 DIAGNOSIS — R634 Abnormal weight loss: Secondary | ICD-10-CM | POA: Diagnosis not present

## 2016-02-09 MED ORDER — TRAZODONE HCL 100 MG PO TABS: ORAL_TABLET | ORAL | 3 refills | Status: DC

## 2016-02-09 MED ORDER — MIRTAZAPINE 30 MG PO TABS: ORAL_TABLET | ORAL | 3 refills | Status: DC

## 2016-02-09 MED ORDER — BENZONATATE 100 MG PO CAPS: 100.0000 mg | ORAL_CAPSULE | Freq: Three times a day (TID) | ORAL | 0 refills | Status: DC | PRN

## 2016-02-09 NOTE — Progress Notes (Signed)
Pre visit review using our clinic review tool, if applicable. No additional management support is needed unless otherwise documented below in the visit note. 

## 2016-02-09 NOTE — Assessment & Plan Note (Signed)
Continuing to lose weight.  Has been out of remeron. Refilled today. Still smoking. Advised to quit. Weight loss likely due to severe cardiomyopathy/CHF. Has upcoming appt with Heart failure and GI.

## 2016-02-09 NOTE — Progress Notes (Signed)
Subjective:  Patient ID: Martin Hernandez, male    DOB: 1955/12/05  Age: 60 y.o. MRN: [id number]  CC: Cough, Weight loss  HPI:  60 year old male with a complicated past medical history including CAD, ischemic cardiopathy, PAD, hypertension, hyperlipidemia, PAD presents with the above complaints.  Weight loss   Ongoing issue.   Patient has been on Remeron and has recently run out and did not inform me.  He reports ongoing weight loss. He states that his appetite seems to be okay.  We have discussed this previously and I informed him that this is likely secondary to underlying severe cardiac disease.  He's had some recent abdominal pain and informed his cardiologist. He is scheduled to see GI for workup for potential underlying causes later this month.   Cough  Chronic, ongoing.  No fever, chills.  Has baseline SOB and continues to smoke.  Has had improvement with Tessalon in the past and would like this today.  HTN  Stable on Metoprolol and Lisinopril.  HLD  Well controlled on Lipitor.   Social Hx   Social History   Social History  . Marital status: Married    Spouse name: N/A  . Number of children: N/A  . Years of education: N/A   Social History Main Topics  . Smoking status: Current Every Day Smoker    Packs/day: 1.30    Types: Cigarettes  . Smokeless tobacco: Never Used  . Alcohol use 3.6 - 4.8 oz/week    6 - 8 Cans of beer per week  . Drug use: No  . Sexual activity: Not Currently   Other Topics Concern  . None   Social History Narrative  . None    Review of Systems  Constitutional: Positive for unexpected weight change.  Respiratory: Positive for cough and shortness of breath.   Cardiovascular: Negative for chest pain.   Objective:  BP 108/76 (BP Location: Right Arm, Patient Position: Sitting, Cuff Size: Normal)   Pulse 78   Temp 97.6 F (36.4 C) (Oral)   Resp 14   Wt 123 lb (55.8 kg)   SpO2 96%   BMI 17.65 kg/m   BP/Weight  02/09/2016 01/06/2016 11/18/2015  Systolic BP 108 130 107  Diastolic BP 76 78 71  Wt. (Lbs) 123 125.25 126.25  BMI 17.65 17.97 18.11   Physical Exam  Constitutional: He is oriented to person, place, and time.  Chronically ill-appearing male in no acute distress.  Cardiovascular: Normal rate and regular rhythm.   Pulmonary/Chest: Effort normal and breath sounds normal.  Abdominal: Soft. He exhibits no distension.  Neurological: He is alert and oriented to person, place, and time.  Psychiatric: He has a normal mood and affect.  Vitals reviewed.  Lab Results  Component Value Date   GLUCOSE 55 (L) 01/06/2016   ALT 10 01/06/2016   AST 15 01/06/2016   NA 144 01/06/2016   K 3.7 01/06/2016   CL 109 01/06/2016   CREATININE 0.82 01/06/2016   BUN 14 01/06/2016   CO2 30 01/06/2016    Assessment & Plan:   Problem List Items Addressed This Visit    Weight loss - Primary    Continuing to lose weight.  Has been out of remeron. Refilled today. Still smoking. Advised to quit. Weight loss likely due to severe cardiomyopathy/CHF. Has upcoming appt with Heart failure and GI.       HLD (hyperlipidemia)    Well controlled. Continue lipitor.       Essential hypertension  Stable. Continue Lisinopril and Metoprolol.       Cough    New problem. Euvolemic today. Secondary to chronic tobacco abuse.  PRN Tessalon per patient request.        Other Visit Diagnoses   None.     Meds ordered this encounter  Medications  . DISCONTD: traZODone (DESYREL) 100 MG tablet    Sig: TAKE ONE TABLET EVERY EVENING FOR SLEEP    Dispense:  90 tablet    Refill:  3  . mirtazapine (REMERON) 30 MG tablet    Sig: 1 tablet nightly    Dispense:  90 tablet    Refill:  3  . benzonatate (TESSALON) 100 MG capsule    Sig: Take 1 capsule (100 mg total) by mouth 3 (three) times daily as needed.    Dispense:  90 capsule    Refill:  0    Follow-up: Return in about 3 months (around 05/11/2016).  Everlene OtherJayce  Sasuke Yaffe DO [facility] [place]

## 2016-02-09 NOTE — Patient Instructions (Signed)
See the GI doc.  Stop smoking.  No alcohol.  Be sure to see the heart failure doctor.  Follow up 3 months.  Take care  Dr. Adriana Simasook

## 2016-02-09 NOTE — Assessment & Plan Note (Signed)
Well controlled. Continue lipitor.  

## 2016-02-09 NOTE — Assessment & Plan Note (Signed)
New problem. Euvolemic today. Secondary to chronic tobacco abuse.  PRN Tessalon per patient request.

## 2016-02-09 NOTE — Assessment & Plan Note (Signed)
Stable. Continue Lisinopril and Metoprolol.

## 2016-02-11 DIAGNOSIS — M47817 Spondylosis without myelopathy or radiculopathy, lumbosacral region: Secondary | ICD-10-CM | POA: Diagnosis not present

## 2016-02-11 DIAGNOSIS — G894 Chronic pain syndrome: Secondary | ICD-10-CM | POA: Diagnosis not present

## 2016-02-11 DIAGNOSIS — Z79899 Other long term (current) drug therapy: Secondary | ICD-10-CM | POA: Diagnosis not present

## 2016-02-11 DIAGNOSIS — M79606 Pain in leg, unspecified: Secondary | ICD-10-CM | POA: Diagnosis not present

## 2016-02-11 DIAGNOSIS — M545 Low back pain: Secondary | ICD-10-CM | POA: Diagnosis not present

## 2016-02-11 DIAGNOSIS — Z79891 Long term (current) use of opiate analgesic: Secondary | ICD-10-CM | POA: Diagnosis not present

## 2016-02-15 ENCOUNTER — Other Ambulatory Visit: Payer: Self-pay | Admitting: Nurse Practitioner

## 2016-02-15 DIAGNOSIS — D649 Anemia, unspecified: Secondary | ICD-10-CM | POA: Diagnosis not present

## 2016-02-15 DIAGNOSIS — R1030 Lower abdominal pain, unspecified: Secondary | ICD-10-CM

## 2016-02-15 DIAGNOSIS — R194 Change in bowel habit: Secondary | ICD-10-CM

## 2016-02-15 DIAGNOSIS — R1031 Right lower quadrant pain: Secondary | ICD-10-CM

## 2016-02-15 DIAGNOSIS — R1032 Left lower quadrant pain: Secondary | ICD-10-CM | POA: Insufficient documentation

## 2016-02-15 DIAGNOSIS — R8290 Unspecified abnormal findings in urine: Secondary | ICD-10-CM | POA: Diagnosis not present

## 2016-02-15 DIAGNOSIS — R634 Abnormal weight loss: Secondary | ICD-10-CM

## 2016-02-15 DIAGNOSIS — R630 Anorexia: Secondary | ICD-10-CM | POA: Insufficient documentation

## 2016-02-15 HISTORY — DX: Right lower quadrant pain: R10.31

## 2016-02-23 ENCOUNTER — Ambulatory Visit: Payer: Medicare Other

## 2016-02-25 ENCOUNTER — Telehealth: Payer: Self-pay | Admitting: Family Medicine

## 2016-02-25 NOTE — Telephone Encounter (Signed)
Recommendations on medications?

## 2016-02-25 NOTE — Telephone Encounter (Signed)
Pt wanted to know if you would call him anything for his nerves and jitters.. Please states that he has a lot going on.. Please advise

## 2016-02-26 NOTE — Telephone Encounter (Signed)
Needs to be seen

## 2016-02-28 ENCOUNTER — Ambulatory Visit (INDEPENDENT_AMBULATORY_CARE_PROVIDER_SITE_OTHER): Payer: Medicare Other | Admitting: Family Medicine

## 2016-02-28 ENCOUNTER — Encounter: Payer: Self-pay | Admitting: Family Medicine

## 2016-02-28 DIAGNOSIS — F418 Other specified anxiety disorders: Secondary | ICD-10-CM

## 2016-02-28 DIAGNOSIS — F329 Major depressive disorder, single episode, unspecified: Secondary | ICD-10-CM

## 2016-02-28 DIAGNOSIS — F419 Anxiety disorder, unspecified: Secondary | ICD-10-CM | POA: Insufficient documentation

## 2016-02-28 DIAGNOSIS — F32A Depression, unspecified: Secondary | ICD-10-CM | POA: Insufficient documentation

## 2016-02-28 HISTORY — DX: Depression, unspecified: F32.A

## 2016-02-28 HISTORY — DX: Anxiety disorder, unspecified: F41.9

## 2016-02-28 MED ORDER — CITALOPRAM HYDROBROMIDE 10 MG PO TABS: 10.0000 mg | ORAL_TABLET | Freq: Every day | ORAL | 1 refills | Status: DC

## 2016-02-28 NOTE — Patient Instructions (Signed)
Take the medication as prescribed.  Hope you have a nice holiday season.  Follow up in ~ 6 weeks.  Take care  Dr. Adriana Simasook

## 2016-02-28 NOTE — Progress Notes (Signed)
   Subjective:  Patient ID: Martin Hernandez, male    DOB: 01-Sep-1955  Age: 60 y.o. MRN: [id number]  CC: Anxiety  HPI:  60 year old with an extensive cardiac history and a history of chronic opioid use presents with the above complaint.  Patient states that he has recently been having a tough time. He lost his son 8 months ago. As we're approaching the holiday season, he and his wife have been struggling regarding this. He states that he feels like he needs something for his nerves. He is tearful during history today. Additionally, patient states that he's having other issues at home. He states that his other son has been sick recently. This is been quite stressful. He feels anxious. Likely has underlying depression as well. No known relieving factors. No other complaints or issues at this time.   Social Hx   Social History   Social History  . Marital status: Married    Spouse name: N/A  . Number of children: N/A  . Years of education: N/A   Social History Main Topics  . Smoking status: Current Every Day Smoker    Packs/day: 1.30    Types: Cigarettes  . Smokeless tobacco: Never Used  . Alcohol use 3.6 - 4.8 oz/week    6 - 8 Cans of beer per week  . Drug use: No  . Sexual activity: Not Currently   Other Topics Concern  . None   Social History Narrative  . None    Review of Systems  Constitutional:       Poor appetite.  Psychiatric/Behavioral: The patient is nervous/anxious.     Objective:  BP 115/78 (BP Location: Left Arm, Patient Position: Sitting, Cuff Size: Normal)   Pulse 88   Temp 98.3 F (36.8 C) (Oral)   Resp 14   Wt 129 lb 8 oz (58.7 kg)   SpO2 97%   BMI 18.58 kg/m   BP/Weight 02/28/2016 02/09/2016 01/06/2016  Systolic BP 115 108 130  Diastolic BP 78 76 78  Wt. (Lbs) 129.5 123 125.25  BMI 18.58 17.65 17.97    Physical Exam  Constitutional:  Chronically ill-appearing African-American male in NAD.   Cardiovascular: Normal rate and regular rhythm.    Pulmonary/Chest: Effort normal.  Psychiatric:  Flat affect. Depressed mood.  Vitals reviewed.   Lab Results  Component Value Date   GLUCOSE 55 (L) 01/06/2016   ALT 10 01/06/2016   AST 15 01/06/2016   NA 144 01/06/2016   K 3.7 01/06/2016   CL 109 01/06/2016   CREATININE 0.82 01/06/2016   BUN 14 01/06/2016   CO2 30 01/06/2016    Assessment & Plan:   Problem List Items Addressed This Visit    Anxiety and depression    New problem. Starting Celexa.          Meds ordered this encounter  Medications  . citalopram (CELEXA) 10 MG tablet    Sig: Take 1 tablet (10 mg total) by mouth daily.    Dispense:  90 tablet    Refill:  1    Follow-up: 6 weeks.   Everlene OtherJayce Elaf Clauson DO [facility]

## 2016-02-28 NOTE — Assessment & Plan Note (Signed)
New problem. Starting Celexa.

## 2016-02-28 NOTE — Telephone Encounter (Signed)
Pt scheduled at 130 p.m. today 11/20

## 2016-02-29 ENCOUNTER — Ambulatory Visit
Admission: RE | Admit: 2016-02-29 | Discharge: 2016-02-29 | Disposition: A | Discharge status: Home / Self Care | Payer: Medicare Other | Source: Ambulatory Visit | Attending: Nurse Practitioner | Admitting: Nurse Practitioner

## 2016-02-29 DIAGNOSIS — N200 Calculus of kidney: Secondary | ICD-10-CM | POA: Diagnosis not present

## 2016-02-29 DIAGNOSIS — I700 Atherosclerosis of aorta: Secondary | ICD-10-CM | POA: Diagnosis not present

## 2016-02-29 DIAGNOSIS — K6389 Other specified diseases of intestine: Secondary | ICD-10-CM | POA: Diagnosis not present

## 2016-02-29 DIAGNOSIS — I517 Cardiomegaly: Secondary | ICD-10-CM | POA: Insufficient documentation

## 2016-02-29 DIAGNOSIS — R194 Change in bowel habit: Secondary | ICD-10-CM | POA: Insufficient documentation

## 2016-02-29 DIAGNOSIS — R1030 Lower abdominal pain, unspecified: Secondary | ICD-10-CM | POA: Insufficient documentation

## 2016-02-29 DIAGNOSIS — R634 Abnormal weight loss: Secondary | ICD-10-CM | POA: Diagnosis present

## 2016-02-29 MED ORDER — IOPAMIDOL (ISOVUE-300) INJECTION 61%
75.0000 mL | Freq: Once | INTRAVENOUS | Status: AC | PRN
  Administered 2016-02-29: 75 mL via INTRAVENOUS

## 2016-03-06 ENCOUNTER — Other Ambulatory Visit: Payer: Self-pay | Admitting: Nurse Practitioner

## 2016-03-06 ENCOUNTER — Other Ambulatory Visit: Payer: Self-pay | Admitting: Oncology

## 2016-03-06 DIAGNOSIS — R634 Abnormal weight loss: Secondary | ICD-10-CM

## 2016-03-06 DIAGNOSIS — R933 Abnormal findings on diagnostic imaging of other parts of digestive tract: Secondary | ICD-10-CM

## 2016-03-09 ENCOUNTER — Ambulatory Visit
Admission: RE | Admit: 2016-03-09 | Discharge: 2016-03-09 | Disposition: A | Discharge status: Home / Self Care | Payer: Medicare Other | Source: Ambulatory Visit | Attending: Nurse Practitioner | Admitting: Nurse Practitioner

## 2016-03-09 DIAGNOSIS — R933 Abnormal findings on diagnostic imaging of other parts of digestive tract: Secondary | ICD-10-CM | POA: Diagnosis not present

## 2016-03-09 DIAGNOSIS — R634 Abnormal weight loss: Secondary | ICD-10-CM | POA: Diagnosis not present

## 2016-03-09 DIAGNOSIS — K6389 Other specified diseases of intestine: Secondary | ICD-10-CM | POA: Diagnosis not present

## 2016-03-09 LAB — GLUCOSE, CAPILLARY: Glucose-Capillary: 93 mg/dL (ref 65–99)

## 2016-03-09 MED ORDER — FLUDEOXYGLUCOSE F - 18 (FDG) INJECTION
12.4300 | Freq: Once | INTRAVENOUS | Status: AC | PRN
  Administered 2016-03-09: 12.43 via INTRAVENOUS

## 2016-03-13 DIAGNOSIS — Z79891 Long term (current) use of opiate analgesic: Secondary | ICD-10-CM | POA: Diagnosis not present

## 2016-03-13 DIAGNOSIS — Z79899 Other long term (current) drug therapy: Secondary | ICD-10-CM | POA: Diagnosis not present

## 2016-03-13 DIAGNOSIS — M79609 Pain in unspecified limb: Secondary | ICD-10-CM | POA: Diagnosis not present

## 2016-03-13 DIAGNOSIS — M545 Low back pain: Secondary | ICD-10-CM | POA: Diagnosis not present

## 2016-03-13 DIAGNOSIS — M47817 Spondylosis without myelopathy or radiculopathy, lumbosacral region: Secondary | ICD-10-CM | POA: Diagnosis not present

## 2016-03-13 DIAGNOSIS — M47816 Spondylosis without myelopathy or radiculopathy, lumbar region: Secondary | ICD-10-CM | POA: Diagnosis not present

## 2016-03-13 DIAGNOSIS — G894 Chronic pain syndrome: Secondary | ICD-10-CM | POA: Diagnosis not present

## 2016-03-14 ENCOUNTER — Other Ambulatory Visit: Payer: Self-pay | Admitting: Nurse Practitioner

## 2016-03-14 DIAGNOSIS — R933 Abnormal findings on diagnostic imaging of other parts of digestive tract: Secondary | ICD-10-CM

## 2016-03-14 DIAGNOSIS — R634 Abnormal weight loss: Secondary | ICD-10-CM

## 2016-03-20 ENCOUNTER — Ambulatory Visit
Admission: RE | Admit: 2016-03-20 | Discharge: 2016-03-20 | Disposition: A | Discharge status: Home / Self Care | Payer: Medicare Other | Source: Ambulatory Visit | Attending: Nurse Practitioner | Admitting: Nurse Practitioner

## 2016-03-20 DIAGNOSIS — N200 Calculus of kidney: Secondary | ICD-10-CM | POA: Diagnosis not present

## 2016-03-20 DIAGNOSIS — R634 Abnormal weight loss: Secondary | ICD-10-CM | POA: Diagnosis present

## 2016-03-20 DIAGNOSIS — K6389 Other specified diseases of intestine: Secondary | ICD-10-CM | POA: Diagnosis not present

## 2016-03-20 DIAGNOSIS — R933 Abnormal findings on diagnostic imaging of other parts of digestive tract: Secondary | ICD-10-CM

## 2016-03-20 LAB — POCT I-STAT CREATININE: Creatinine, Ser: 0.9 mg/dL (ref 0.61–1.24)

## 2016-03-20 MED ORDER — IOPAMIDOL (ISOVUE-370) INJECTION 76%
75.0000 mL | Freq: Once | INTRAVENOUS | Status: AC | PRN
  Administered 2016-03-20: 75 mL via INTRAVENOUS

## 2016-04-12 DIAGNOSIS — Z79891 Long term (current) use of opiate analgesic: Secondary | ICD-10-CM | POA: Diagnosis not present

## 2016-04-12 DIAGNOSIS — Z79899 Other long term (current) drug therapy: Secondary | ICD-10-CM | POA: Diagnosis not present

## 2016-04-12 DIAGNOSIS — G894 Chronic pain syndrome: Secondary | ICD-10-CM | POA: Diagnosis not present

## 2016-04-12 DIAGNOSIS — M545 Low back pain: Secondary | ICD-10-CM | POA: Diagnosis not present

## 2016-04-12 DIAGNOSIS — M47817 Spondylosis without myelopathy or radiculopathy, lumbosacral region: Secondary | ICD-10-CM | POA: Diagnosis not present

## 2016-05-11 ENCOUNTER — Ambulatory Visit: Payer: Medicare Other | Admitting: Family Medicine

## 2016-05-12 ENCOUNTER — Other Ambulatory Visit: Payer: Self-pay | Admitting: *Deleted

## 2016-05-12 MED ORDER — ZOLPIDEM TARTRATE 10 MG PO TABS: 10.0000 mg | ORAL_TABLET | Freq: Every day | ORAL | 2 refills | Status: DC

## 2016-05-12 NOTE — Telephone Encounter (Signed)
When pt was seen 02/2016 celexa was started nothing was stated about xanax or trazadone. Please advise?

## 2016-05-12 NOTE — Telephone Encounter (Signed)
Pt stated that he did not receive a medication refill for trazodone , xanax and a vitamin. Pt uses [name] Pt contact [phone]

## 2016-05-12 NOTE — Telephone Encounter (Signed)
faxed

## 2016-05-12 NOTE — Telephone Encounter (Signed)
I have never prescribed xanax (nothing in his historical meds either). He has switched from Trazodone to Ambien.

## 2016-05-12 NOTE — Telephone Encounter (Signed)
Refilled 01/18/2016. Pt last seen 02/28/16. Please advise?

## 2016-05-16 DIAGNOSIS — M79609 Pain in unspecified limb: Secondary | ICD-10-CM | POA: Diagnosis not present

## 2016-05-16 DIAGNOSIS — R200 Anesthesia of skin: Secondary | ICD-10-CM | POA: Diagnosis not present

## 2016-05-16 DIAGNOSIS — M545 Low back pain: Secondary | ICD-10-CM | POA: Diagnosis not present

## 2016-05-18 DIAGNOSIS — Z79891 Long term (current) use of opiate analgesic: Secondary | ICD-10-CM | POA: Diagnosis not present

## 2016-05-18 DIAGNOSIS — M47817 Spondylosis without myelopathy or radiculopathy, lumbosacral region: Secondary | ICD-10-CM | POA: Diagnosis not present

## 2016-05-18 DIAGNOSIS — Z79899 Other long term (current) drug therapy: Secondary | ICD-10-CM | POA: Diagnosis not present

## 2016-05-18 DIAGNOSIS — M79609 Pain in unspecified limb: Secondary | ICD-10-CM | POA: Diagnosis not present

## 2016-05-18 DIAGNOSIS — M79606 Pain in leg, unspecified: Secondary | ICD-10-CM | POA: Diagnosis not present

## 2016-05-18 DIAGNOSIS — M47816 Spondylosis without myelopathy or radiculopathy, lumbar region: Secondary | ICD-10-CM | POA: Diagnosis not present

## 2016-05-18 DIAGNOSIS — G894 Chronic pain syndrome: Secondary | ICD-10-CM | POA: Diagnosis not present

## 2016-05-18 DIAGNOSIS — M545 Low back pain: Secondary | ICD-10-CM | POA: Diagnosis not present

## 2016-05-25 ENCOUNTER — Ambulatory Visit: Payer: Medicare Other | Admitting: Family Medicine

## 2016-06-12 ENCOUNTER — Ambulatory Visit (INDEPENDENT_AMBULATORY_CARE_PROVIDER_SITE_OTHER): Payer: Medicare Other | Admitting: Family Medicine

## 2016-06-12 DIAGNOSIS — E785 Hyperlipidemia, unspecified: Secondary | ICD-10-CM

## 2016-06-12 DIAGNOSIS — F32A Depression, unspecified: Secondary | ICD-10-CM

## 2016-06-12 DIAGNOSIS — I10 Essential (primary) hypertension: Secondary | ICD-10-CM

## 2016-06-12 DIAGNOSIS — I255 Ischemic cardiomyopathy: Secondary | ICD-10-CM

## 2016-06-12 DIAGNOSIS — R634 Abnormal weight loss: Secondary | ICD-10-CM

## 2016-06-12 DIAGNOSIS — F419 Anxiety disorder, unspecified: Secondary | ICD-10-CM

## 2016-06-12 DIAGNOSIS — F329 Major depressive disorder, single episode, unspecified: Secondary | ICD-10-CM

## 2016-06-12 DIAGNOSIS — R7303 Prediabetes: Secondary | ICD-10-CM | POA: Insufficient documentation

## 2016-06-12 DIAGNOSIS — F418 Other specified anxiety disorders: Secondary | ICD-10-CM

## 2016-06-12 MED ORDER — VITAMIN B-1 100 MG PO TABS: 100.0000 mg | ORAL_TABLET | Freq: Every day | ORAL | 3 refills | Status: DC

## 2016-06-12 MED ORDER — PYRIDOXINE HCL 100 MG PO TABS
100.0000 mg | ORAL_TABLET | Freq: Every day | ORAL | 3 refills | Status: DC
Start: 2016-06-12 — End: 2016-09-14

## 2016-06-12 NOTE — Assessment & Plan Note (Signed)
At goal on lipitor. Continue. 

## 2016-06-12 NOTE — Progress Notes (Signed)
Pre visit review using our clinic review tool, if applicable. No additional management support is needed unless otherwise documented below in the visit note. 

## 2016-06-12 NOTE — Assessment & Plan Note (Signed)
Saw GI. Unremarkable workup. Gaining weight. Continue Remeron.

## 2016-06-12 NOTE — Patient Instructions (Signed)
Keep an eye on your BP at home.  Continue your meds.  Follow up in 3-6 months.  Take care  Dr Adriana Simasook

## 2016-06-12 NOTE — Progress Notes (Signed)
   Subjective:  Patient ID: Martin Hernandez, male    DOB: 1955-12-22  Age: 61 y.o. MRN: [id number]  CC: Follow up  HPI:  61 year old male with an extensive PMH presents for follow up.  Weight loss  Weight going back up.  Saw GI and work up has been negative (to the best I can tell from care everywhere).  CHF/Ischemic cardiomyopathy/CAD  Followed by cardiology.  Extensive history.  Stable at this time.  Compliant with ACE inhibitor, beta blocker, Aspirin, Plavix, Statin.  HLD  At goal on Lipitor.  HTN  Has been stable.  BP elevated today.  Anxiety and depression  Stable on Celexa.  States he is doing fairly well.   Social Hx   Social History   Social History  . Marital status: Married    Spouse name: N/A  . Number of children: N/A  . Years of education: N/A   Social History Main Topics  . Smoking status: Current Every Day Smoker    Packs/day: 1.30    Types: Cigarettes  . Smokeless tobacco: Never Used  . Alcohol use 3.6 - 4.8 oz/week    6 - 8 Cans of beer per week  . Drug use: No  . Sexual activity: Not Currently   Other Topics Concern  . Not on file   Social History Narrative  . No narrative on file    Review of Systems  Constitutional: Negative.   Respiratory: Positive for cough.   Cardiovascular: Negative for chest pain.  Gastrointestinal: Negative.    Objective:  BP (!) 152/98 (BP Location: Left Arm, Cuff Size: Normal)   Temp 98.3 F (36.8 C) (Oral)   Wt 130 lb 3.2 oz (59.1 kg)   BMI 18.68 kg/m   BP/Weight 06/12/2016 02/28/2016 02/09/2016  Systolic BP 152 115 108  Diastolic BP 98 78 76  Wt. (Lbs) 130.2 129.5 123  BMI 18.68 18.58 17.65   Physical Exam  Constitutional: He is oriented to person, place, and time.  Thin male in NAD.  Cardiovascular: Normal rate and regular rhythm.   Pulmonary/Chest: Effort normal and breath sounds normal.  Neurological: He is alert and oriented to person, place, and time.  Psychiatric: He has  a normal mood and affect.  Vitals reviewed.  Lab Results  Component Value Date   GLUCOSE 55 (L) 01/06/2016   ALT 10 01/06/2016   AST 15 01/06/2016   NA 144 01/06/2016   K 3.7 01/06/2016   CL 109 01/06/2016   CREATININE 0.90 03/20/2016   BUN 14 01/06/2016   CO2 30 01/06/2016    Assessment & Plan:   Problem List Items Addressed This Visit    Weight loss    Saw GI. Unremarkable workup. Gaining weight. Continue Remeron.      HLD (hyperlipidemia)    At goal on lipitor. Continue.      Essential hypertension    BP elevated today. Has been well controlled. Advised to keep a close eye at home. Continue current meds - Lisinopril, Metoprolol.       Cardiomyopathy, ischemic    Stable. Continue current medications.      Anxiety and depression    Stable. Continue Celexa.        Follow-up: 3-6 months.  Everlene OtherJayce Bari Handshoe DO [facility] [place]

## 2016-06-12 NOTE — Assessment & Plan Note (Signed)
Stable ?Continue Celexa ?

## 2016-06-12 NOTE — Assessment & Plan Note (Signed)
Stable.  Continue current medications.

## 2016-06-12 NOTE — Assessment & Plan Note (Signed)
BP elevated today. Has been well controlled. Advised to keep a close eye at home. Continue current meds - Lisinopril, Metoprolol.

## 2016-06-15 DIAGNOSIS — M47817 Spondylosis without myelopathy or radiculopathy, lumbosacral region: Secondary | ICD-10-CM | POA: Diagnosis not present

## 2016-06-15 DIAGNOSIS — Z79891 Long term (current) use of opiate analgesic: Secondary | ICD-10-CM | POA: Diagnosis not present

## 2016-06-15 DIAGNOSIS — G894 Chronic pain syndrome: Secondary | ICD-10-CM | POA: Diagnosis not present

## 2016-06-15 DIAGNOSIS — M79606 Pain in leg, unspecified: Secondary | ICD-10-CM | POA: Diagnosis not present

## 2016-06-15 DIAGNOSIS — M545 Low back pain: Secondary | ICD-10-CM | POA: Diagnosis not present

## 2016-06-15 DIAGNOSIS — Z79899 Other long term (current) drug therapy: Secondary | ICD-10-CM | POA: Diagnosis not present

## 2016-07-12 ENCOUNTER — Other Ambulatory Visit: Payer: Self-pay | Admitting: Pain Medicine

## 2016-07-12 DIAGNOSIS — M5136 Other intervertebral disc degeneration, lumbar region: Secondary | ICD-10-CM | POA: Diagnosis not present

## 2016-07-12 DIAGNOSIS — M79604 Pain in right leg: Secondary | ICD-10-CM | POA: Diagnosis not present

## 2016-07-12 DIAGNOSIS — M47817 Spondylosis without myelopathy or radiculopathy, lumbosacral region: Secondary | ICD-10-CM | POA: Diagnosis not present

## 2016-07-18 DIAGNOSIS — E785 Hyperlipidemia, unspecified: Secondary | ICD-10-CM | POA: Diagnosis not present

## 2016-07-18 DIAGNOSIS — I5022 Chronic systolic (congestive) heart failure: Secondary | ICD-10-CM | POA: Diagnosis not present

## 2016-07-18 DIAGNOSIS — I255 Ischemic cardiomyopathy: Secondary | ICD-10-CM | POA: Diagnosis not present

## 2016-07-18 DIAGNOSIS — I2510 Atherosclerotic heart disease of native coronary artery without angina pectoris: Secondary | ICD-10-CM | POA: Diagnosis not present

## 2016-07-18 DIAGNOSIS — I714 Abdominal aortic aneurysm, without rupture: Secondary | ICD-10-CM | POA: Diagnosis not present

## 2016-07-31 DIAGNOSIS — M545 Low back pain: Secondary | ICD-10-CM | POA: Diagnosis not present

## 2016-07-31 DIAGNOSIS — I25110 Atherosclerotic heart disease of native coronary artery with unstable angina pectoris: Secondary | ICD-10-CM | POA: Diagnosis not present

## 2016-07-31 DIAGNOSIS — R61 Generalized hyperhidrosis: Secondary | ICD-10-CM | POA: Diagnosis not present

## 2016-07-31 DIAGNOSIS — I493 Ventricular premature depolarization: Secondary | ICD-10-CM | POA: Diagnosis not present

## 2016-07-31 DIAGNOSIS — G629 Polyneuropathy, unspecified: Secondary | ICD-10-CM | POA: Diagnosis not present

## 2016-07-31 DIAGNOSIS — I739 Peripheral vascular disease, unspecified: Secondary | ICD-10-CM | POA: Diagnosis not present

## 2016-07-31 DIAGNOSIS — G8929 Other chronic pain: Secondary | ICD-10-CM | POA: Diagnosis not present

## 2016-07-31 DIAGNOSIS — Z79899 Other long term (current) drug therapy: Secondary | ICD-10-CM | POA: Diagnosis not present

## 2016-07-31 DIAGNOSIS — R079 Chest pain, unspecified: Secondary | ICD-10-CM | POA: Diagnosis not present

## 2016-07-31 DIAGNOSIS — Z95810 Presence of automatic (implantable) cardiac defibrillator: Secondary | ICD-10-CM | POA: Diagnosis not present

## 2016-07-31 DIAGNOSIS — Z7982 Long term (current) use of aspirin: Secondary | ICD-10-CM | POA: Diagnosis not present

## 2016-07-31 DIAGNOSIS — I2510 Atherosclerotic heart disease of native coronary artery without angina pectoris: Secondary | ICD-10-CM | POA: Diagnosis not present

## 2016-07-31 DIAGNOSIS — I255 Ischemic cardiomyopathy: Secondary | ICD-10-CM | POA: Diagnosis not present

## 2016-07-31 DIAGNOSIS — I491 Atrial premature depolarization: Secondary | ICD-10-CM | POA: Diagnosis not present

## 2016-07-31 DIAGNOSIS — R0602 Shortness of breath: Secondary | ICD-10-CM | POA: Diagnosis not present

## 2016-07-31 DIAGNOSIS — I5022 Chronic systolic (congestive) heart failure: Secondary | ICD-10-CM | POA: Diagnosis not present

## 2016-07-31 DIAGNOSIS — E785 Hyperlipidemia, unspecified: Secondary | ICD-10-CM | POA: Diagnosis not present

## 2016-07-31 DIAGNOSIS — K219 Gastro-esophageal reflux disease without esophagitis: Secondary | ICD-10-CM | POA: Diagnosis not present

## 2016-07-31 DIAGNOSIS — I110 Hypertensive heart disease with heart failure: Secondary | ICD-10-CM | POA: Diagnosis not present

## 2016-08-01 DIAGNOSIS — K219 Gastro-esophageal reflux disease without esophagitis: Secondary | ICD-10-CM | POA: Diagnosis not present

## 2016-08-01 DIAGNOSIS — R079 Chest pain, unspecified: Secondary | ICD-10-CM | POA: Diagnosis not present

## 2016-08-01 DIAGNOSIS — G8929 Other chronic pain: Secondary | ICD-10-CM | POA: Diagnosis not present

## 2016-08-01 DIAGNOSIS — I10 Essential (primary) hypertension: Secondary | ICD-10-CM | POA: Diagnosis not present

## 2016-08-01 DIAGNOSIS — I2510 Atherosclerotic heart disease of native coronary artery without angina pectoris: Secondary | ICD-10-CM | POA: Diagnosis not present

## 2016-08-01 DIAGNOSIS — Z95810 Presence of automatic (implantable) cardiac defibrillator: Secondary | ICD-10-CM | POA: Diagnosis not present

## 2016-08-01 DIAGNOSIS — Z720 Tobacco use: Secondary | ICD-10-CM | POA: Diagnosis not present

## 2016-08-08 ENCOUNTER — Other Ambulatory Visit: Payer: Self-pay | Admitting: Family Medicine

## 2016-08-08 MED ORDER — CITALOPRAM HYDROBROMIDE 10 MG PO TABS: 10.0000 mg | ORAL_TABLET | Freq: Every day | ORAL | 0 refills | Status: DC

## 2016-08-08 NOTE — Telephone Encounter (Signed)
Faxed

## 2016-08-08 NOTE — Telephone Encounter (Signed)
Refilled: 05/12/16 Last OV: 06/12/16 Last Labs: 01/06/16 Future OV: none Please advise?

## 2016-08-14 ENCOUNTER — Other Ambulatory Visit: Payer: Self-pay | Admitting: Pain Medicine

## 2016-08-14 DIAGNOSIS — G894 Chronic pain syndrome: Secondary | ICD-10-CM | POA: Diagnosis not present

## 2016-08-14 DIAGNOSIS — M47817 Spondylosis without myelopathy or radiculopathy, lumbosacral region: Secondary | ICD-10-CM | POA: Diagnosis not present

## 2016-08-14 DIAGNOSIS — M545 Low back pain: Secondary | ICD-10-CM | POA: Diagnosis not present

## 2016-08-14 DIAGNOSIS — M5136 Other intervertebral disc degeneration, lumbar region: Secondary | ICD-10-CM | POA: Diagnosis not present

## 2016-08-14 DIAGNOSIS — M79609 Pain in unspecified limb: Secondary | ICD-10-CM | POA: Diagnosis not present

## 2016-08-14 DIAGNOSIS — Z79891 Long term (current) use of opiate analgesic: Secondary | ICD-10-CM | POA: Diagnosis not present

## 2016-08-14 DIAGNOSIS — M47816 Spondylosis without myelopathy or radiculopathy, lumbar region: Secondary | ICD-10-CM | POA: Diagnosis not present

## 2016-08-14 DIAGNOSIS — Z79899 Other long term (current) drug therapy: Secondary | ICD-10-CM | POA: Diagnosis not present

## 2016-08-18 ENCOUNTER — Ambulatory Visit
Admission: RE | Admit: 2016-08-18 | Discharge: 2016-08-18 | Disposition: A | Discharge status: Home / Self Care | Payer: Medicare Other | Source: Ambulatory Visit | Attending: Pain Medicine | Admitting: Pain Medicine

## 2016-08-18 DIAGNOSIS — M545 Low back pain: Secondary | ICD-10-CM | POA: Diagnosis not present

## 2016-08-18 DIAGNOSIS — M79604 Pain in right leg: Secondary | ICD-10-CM

## 2016-08-28 ENCOUNTER — Encounter: Payer: Self-pay | Admitting: Family Medicine

## 2016-08-28 ENCOUNTER — Ambulatory Visit (INDEPENDENT_AMBULATORY_CARE_PROVIDER_SITE_OTHER): Payer: Medicare Other | Admitting: Family Medicine

## 2016-08-28 VITALS — BP 118/84 | HR 76 | Temp 98.2°F | Resp 16 | Wt 132.0 lb

## 2016-08-28 DIAGNOSIS — R252 Cramp and spasm: Secondary | ICD-10-CM

## 2016-08-28 HISTORY — DX: Cramp and spasm: R25.2

## 2016-08-28 MED ORDER — BACLOFEN 10 MG PO TABS: 5.0000 mg | ORAL_TABLET | Freq: Three times a day (TID) | ORAL | 0 refills | Status: DC

## 2016-08-28 NOTE — Progress Notes (Signed)
   Subjective:  Patient ID: Geralyn FlashLindsay F Alcon, male    DOB: April 30, 1955  Age: 61 y.o. MRN: [id number]  CC: Hands locking up  HPI:  61 year old male with an extensive cardiac history presents with the above complaint.  Patient reports that for the past 4 months his hands have "been locking up". He states that they feel like they're going out of place. He states he has to apply pressure to put them back the way they were. Painful. Severe at times. No known exacerbating or relieving factors. No other associated symptoms. No other complaints or concerns at this time.  Social Hx   Social History   Social History  . Marital status: Married    Spouse name: N/A  . Number of children: N/A  . Years of education: N/A   Social History Main Topics  . Smoking status: Current Every Day Smoker    Packs/day: 1.30    Types: Cigarettes  . Smokeless tobacco: Never Used  . Alcohol use 3.6 - 4.8 oz/week    6 - 8 Cans of beer per week  . Drug use: No  . Sexual activity: Not Currently   Other Topics Concern  . None   Social History Narrative  . None   Review of Systems  Constitutional: Negative.   Cardiovascular: Negative for chest pain.  Musculoskeletal:       Hand pain.   Objective:  BP 118/84 (BP Location: Right Arm, Patient Position: Sitting, Cuff Size: Small)   Pulse 76   Temp 98.2 F (36.8 C) (Oral)   Resp 16   Wt 132 lb (59.9 kg)   SpO2 98%   BMI 18.94 kg/m   BP/Weight 08/28/2016 06/12/2016 02/28/2016  Systolic BP 118 152 115  Diastolic BP 84 98 78  Wt. (Lbs) 132 130.2 129.5  BMI 18.94 18.68 18.58   Physical Exam  Constitutional: He is oriented to person, place, and time. He appears well-developed. No distress.  HENT:  Head: Normocephalic and atraumatic.  Pulmonary/Chest: Effort normal.  Musculoskeletal:  Hands - joint laxity noted. No current synovitis. No  tenderness to palpation.  Neurological: He is alert and oriented to person, place, and time.  Psychiatric: He  has a normal mood and affect.  Vitals reviewed.  Lab Results  Component Value Date   GLUCOSE 55 (L) 01/06/2016   ALT 10 01/06/2016   AST 15 01/06/2016   NA 144 01/06/2016   K 3.7 01/06/2016   CL 109 01/06/2016   CREATININE 0.90 03/20/2016   BUN 14 01/06/2016   CO2 30 01/06/2016    Assessment & Plan:   Problem List Items Addressed This Visit    Cramping of hands - Primary    New problem. Trial of Baclofen.         Meds ordered this encounter  Medications  . isosorbide mononitrate (IMDUR) 60 MG 24 hr tablet  . baclofen (LIORESAL) 10 MG tablet    Sig: Take 0.5 tablets (5 mg total) by mouth 3 (three) times daily.    Dispense:  90 each    Refill:  0   Follow-up: As scheduled  Everlene OtherJayce Ariel Wingrove DO [facility] [place]

## 2016-08-28 NOTE — Progress Notes (Signed)
Pre visit review using our clinic review tool, if applicable. No additional management support is needed unless otherwise documented below in the visit note. 

## 2016-08-28 NOTE — Patient Instructions (Signed)
Use the baclofen.  Follow up in 6 months  Take care  Dr. Adriana Simasook

## 2016-08-28 NOTE — Assessment & Plan Note (Signed)
New problem. Trial of Baclofen.

## 2016-09-05 ENCOUNTER — Other Ambulatory Visit: Payer: Self-pay | Admitting: Family Medicine

## 2016-09-05 NOTE — Telephone Encounter (Signed)
Rx sent 

## 2016-09-05 NOTE — Telephone Encounter (Signed)
Ambien Rx 10 mg Last OV 5/ 21/2018 Next OV 11/02/2016 Denisa  No Ov with Dr. Adriana Simasook Last refilled 08/08/16  Please advise

## 2016-09-12 DIAGNOSIS — G894 Chronic pain syndrome: Secondary | ICD-10-CM | POA: Diagnosis not present

## 2016-09-12 DIAGNOSIS — M792 Neuralgia and neuritis, unspecified: Secondary | ICD-10-CM | POA: Diagnosis not present

## 2016-09-12 DIAGNOSIS — Z79899 Other long term (current) drug therapy: Secondary | ICD-10-CM | POA: Diagnosis not present

## 2016-09-12 DIAGNOSIS — Z79891 Long term (current) use of opiate analgesic: Secondary | ICD-10-CM | POA: Diagnosis not present

## 2016-09-13 DIAGNOSIS — G894 Chronic pain syndrome: Secondary | ICD-10-CM | POA: Diagnosis not present

## 2016-09-13 DIAGNOSIS — M545 Low back pain: Secondary | ICD-10-CM | POA: Diagnosis not present

## 2016-09-13 DIAGNOSIS — Z79891 Long term (current) use of opiate analgesic: Secondary | ICD-10-CM | POA: Diagnosis not present

## 2016-09-13 DIAGNOSIS — M79604 Pain in right leg: Secondary | ICD-10-CM | POA: Diagnosis not present

## 2016-09-13 DIAGNOSIS — Z79899 Other long term (current) drug therapy: Secondary | ICD-10-CM | POA: Diagnosis not present

## 2016-09-13 DIAGNOSIS — M79609 Pain in unspecified limb: Secondary | ICD-10-CM | POA: Diagnosis not present

## 2016-09-13 DIAGNOSIS — M5136 Other intervertebral disc degeneration, lumbar region: Secondary | ICD-10-CM | POA: Diagnosis not present

## 2016-09-13 DIAGNOSIS — M47816 Spondylosis without myelopathy or radiculopathy, lumbar region: Secondary | ICD-10-CM | POA: Diagnosis not present

## 2016-09-13 DIAGNOSIS — M47817 Spondylosis without myelopathy or radiculopathy, lumbosacral region: Secondary | ICD-10-CM | POA: Diagnosis not present

## 2016-09-14 ENCOUNTER — Telehealth: Payer: Self-pay | Admitting: Family Medicine

## 2016-09-14 ENCOUNTER — Other Ambulatory Visit: Payer: Self-pay

## 2016-09-14 MED ORDER — ATORVASTATIN CALCIUM 80 MG PO TABS: 80.0000 mg | ORAL_TABLET | Freq: Every day | ORAL | 3 refills | Status: DC

## 2016-09-14 MED ORDER — PYRIDOXINE HCL 100 MG PO TABS: 100.0000 mg | ORAL_TABLET | Freq: Every day | ORAL | 3 refills | Status: DC

## 2016-09-14 MED ORDER — LISINOPRIL 5 MG PO TABS: ORAL_TABLET | ORAL | 3 refills | Status: DC

## 2016-09-14 MED ORDER — ISOSORBIDE MONONITRATE ER 60 MG PO TB24: 60.0000 mg | ORAL_TABLET | Freq: Every day | ORAL | 1 refills | Status: DC

## 2016-09-14 MED ORDER — MIRTAZAPINE 30 MG PO TABS: ORAL_TABLET | ORAL | 3 refills | Status: DC

## 2016-09-14 MED ORDER — BACLOFEN 10 MG PO TABS: 5.0000 mg | ORAL_TABLET | Freq: Three times a day (TID) | ORAL | 0 refills | Status: DC

## 2016-09-14 MED ORDER — METOPROLOL SUCCINATE ER 25 MG PO TB24: 25.0000 mg | ORAL_TABLET | Freq: Every day | ORAL | 3 refills | Status: DC

## 2016-09-14 MED ORDER — CLOPIDOGREL BISULFATE 75 MG PO TABS: ORAL_TABLET | ORAL | 3 refills | Status: DC

## 2016-09-14 MED ORDER — PANTOPRAZOLE SODIUM 40 MG PO TBEC: 40.0000 mg | DELAYED_RELEASE_TABLET | Freq: Every day | ORAL | 3 refills | Status: DC

## 2016-09-14 NOTE — Telephone Encounter (Signed)
[facility] checking the status of medication refill.  atorvastatin (LIPITOR) 80 MG tablet mirtazapine (REMERON) 30 MG tablet pantoprazole (PROTONIX) 40 MG tablet metoprolol succinate (TOPROL-XL) 25 MG 24 hr tablet  isosorbide mononitrate (IMDUR) 60 MG 24 hr tablet   lisinopril (PRINIVIL,ZESTRIL) 5 MG tablet clopidogrel (PLAVIX) 75 MG tablet baclofen (LIORESAL) 10 MG tablet  Pharmacy is [facility] - [place], [place] - [address]  Thank you!

## 2016-09-14 NOTE — Telephone Encounter (Signed)
Patient has new pharmacy [facility] which need new rx sent. Ambien is the only one not filled, since its controlled. Please send to new pharmacy  LOV: 08/28/2016 Next OV:No new appt Last Refilled  09/05/16

## 2016-09-15 NOTE — Telephone Encounter (Signed)
Scripts already sent into [facility] pack pharmacy

## 2016-10-06 ENCOUNTER — Other Ambulatory Visit: Payer: Self-pay

## 2016-10-06 NOTE — Telephone Encounter (Signed)
Have them cancel or transfer Rx.

## 2016-10-06 NOTE — Telephone Encounter (Signed)
Refill request received from [facility] , previously sent to [facility].   09/05/16 with 3 refills.

## 2016-10-09 DIAGNOSIS — Z79891 Long term (current) use of opiate analgesic: Secondary | ICD-10-CM | POA: Diagnosis not present

## 2016-10-09 DIAGNOSIS — M79609 Pain in unspecified limb: Secondary | ICD-10-CM | POA: Diagnosis not present

## 2016-10-09 DIAGNOSIS — G894 Chronic pain syndrome: Secondary | ICD-10-CM | POA: Diagnosis not present

## 2016-10-09 DIAGNOSIS — M47816 Spondylosis without myelopathy or radiculopathy, lumbar region: Secondary | ICD-10-CM | POA: Diagnosis not present

## 2016-10-09 DIAGNOSIS — M545 Low back pain: Secondary | ICD-10-CM | POA: Diagnosis not present

## 2016-10-09 DIAGNOSIS — Z79899 Other long term (current) drug therapy: Secondary | ICD-10-CM | POA: Diagnosis not present

## 2016-10-12 NOTE — Telephone Encounter (Signed)
Spoke with Marylene LandAngela at [facility] and she will transfer script to [facility].

## 2016-10-17 DIAGNOSIS — M48062 Spinal stenosis, lumbar region with neurogenic claudication: Secondary | ICD-10-CM | POA: Diagnosis not present

## 2016-10-18 ENCOUNTER — Other Ambulatory Visit: Payer: Self-pay | Admitting: Orthopedic Surgery

## 2016-10-18 DIAGNOSIS — M48062 Spinal stenosis, lumbar region with neurogenic claudication: Secondary | ICD-10-CM

## 2016-10-19 ENCOUNTER — Other Ambulatory Visit: Payer: Self-pay

## 2016-10-19 MED ORDER — NITROGLYCERIN 0.4 MG SL SUBL: 0.4000 mg | SUBLINGUAL_TABLET | SUBLINGUAL | 3 refills | Status: DC | PRN

## 2016-10-19 NOTE — Telephone Encounter (Signed)
We've never prescribed this  Not on Med list  Last office visit 08/28/16 Follow up 6 months

## 2016-10-30 ENCOUNTER — Ambulatory Visit
Admission: RE | Admit: 2016-10-30 | Discharge: 2016-10-30 | Disposition: A | Discharge status: Home / Self Care | Payer: Medicare Other | Source: Ambulatory Visit | Attending: Orthopedic Surgery | Admitting: Orthopedic Surgery

## 2016-10-30 ENCOUNTER — Other Ambulatory Visit: Payer: Self-pay | Admitting: Family Medicine

## 2016-10-30 DIAGNOSIS — M48061 Spinal stenosis, lumbar region without neurogenic claudication: Secondary | ICD-10-CM | POA: Diagnosis not present

## 2016-10-30 DIAGNOSIS — M48062 Spinal stenosis, lumbar region with neurogenic claudication: Secondary | ICD-10-CM

## 2016-10-30 MED ORDER — IOPAMIDOL (ISOVUE-M 200) INJECTION 41%
15.0000 mL | Freq: Once | INTRAMUSCULAR | Status: AC
  Administered 2016-10-30: 15 mL via INTRATHECAL

## 2016-10-30 MED ORDER — OXYCODONE-ACETAMINOPHEN 5-325 MG PO TABS
2.0000 | ORAL_TABLET | Freq: Once | ORAL | Status: AC
  Administered 2016-10-30: 2 via ORAL

## 2016-10-30 MED ORDER — DIAZEPAM 5 MG PO TABS
5.0000 mg | ORAL_TABLET | Freq: Once | ORAL | Status: AC
  Administered 2016-10-30: 5 mg via ORAL

## 2016-10-30 NOTE — Progress Notes (Signed)
Pt states he has been off Plavix for the past 5 days and off Celexa for the past 2 days.

## 2016-10-30 NOTE — Discharge Instructions (Signed)
Myelogram Discharge Instructions  1. Go home and rest quietly for the next 24 hours.  It is important to lie flat for the next 24 hours.  Get up only to go to the restroom.  You may lie in the bed or on a couch on your back, your stomach, your left side or your right side.  You may have one pillow under your head.  You may have pillows between your knees while you are on your side or under your knees while you are on your back.  2. DO NOT drive today.  Recline the seat as far back as it will go, while still wearing your seat belt, on the way home.  3. You may get up to go to the bathroom as needed.  You may sit up for 10 minutes to eat.  You may resume your normal diet and medications unless otherwise indicated.  Drink lots of extra fluids today and tomorrow.  4. The incidence of headache, nausea, or vomiting is about 5% (one in 20 patients).  If you develop a headache, lie flat and drink plenty of fluids until the headache goes away.  Caffeinated beverages may be helpful.  If you develop severe nausea and vomiting or a headache that does not go away with flat bed rest, call [phone].  5. You may resume normal activities after your 24 hours of bed rest is over; however, do not exert yourself strongly or do any heavy lifting tomorrow. If when you get up you have a headache when standing, go back to bed and force fluids for another 24 hours.  6. Call your physician for a follow-up appointment.  The results of your myelogram will be sent directly to your physician by the following day.  7. If you have any questions or if complications develop after you arrive home, please call [phone].  Discharge instructions have been explained to the patient.  The patient, or the person responsible for the patient, fully understands these instructions.        May resume Plavix today.   May resume Celexa on October 31, 2016, after 1:00 pm.

## 2016-11-02 ENCOUNTER — Ambulatory Visit (INDEPENDENT_AMBULATORY_CARE_PROVIDER_SITE_OTHER): Payer: Medicare Other

## 2016-11-02 VITALS — BP 122/70 | HR 78 | Temp 98.3°F | Resp 14 | Ht 69.0 in | Wt 131.4 lb

## 2016-11-02 DIAGNOSIS — Z0000 Encounter for general adult medical examination without abnormal findings: Secondary | ICD-10-CM

## 2016-11-02 NOTE — Patient Instructions (Addendum)
[name] , Thank you for taking time to come for your Medicare Wellness Visit. I appreciate your ongoing commitment to your health goals. Please review the following plan we discussed and let me know if I can assist you in the future.   Keep scheduled appointments with Dr. Adriana Simasook and continue to follow up as needed.      Bring a copy of your Health Care Power of Attorney and/or Living Will to be scanned into chart once completed.  [facility] PHONE:  [phone] [facility] FAX: [phone]  Have a great day!  These are the goals we discussed: Goals    . 3 meals a day          Regular diet    . Healthy Lifestyle          Stay hydrated and drink plenty of fluids. Stretch! Chair exercises recommended with SOB.          This is a list of the screening recommended for you and due dates:  Health Maintenance  Topic Date Due  .  Hepatitis C: One time screening is recommended by Center for Disease Control  (CDC) for  adults born from [birth year] through 1966.   09/15/1955  . HIV Screening  01/02/1970  . Tetanus Vaccine  01/02/1974  . Colon Cancer Screening  01/02/2005  . Flu Shot  02/08/2017*  *Topic was postponed. The date shown is not the original due date.      Colonoscopy, Adult A colonoscopy is an exam to look at the entire large intestine. During the exam, a lubricated, bendable tube is inserted into the anus and then passed into the rectum, colon, and other parts of the large intestine. A colonoscopy is often done as a part of normal colorectal screening or in response to certain symptoms, such as anemia, persistent diarrhea, abdominal pain, and blood in the stool. The exam can help screen for and diagnose medical problems, including:  Tumors.  Polyps.  Inflammation.  Areas of bleeding.  Tell a health care provider about:  Any allergies you have.  All medicines you are taking, including vitamins, herbs, eye drops, creams, and over-the-counter medicines.  Any  problems you or family members have had with anesthetic medicines.  Any blood disorders you have.  Any surgeries you have had.  Any medical conditions you have.  Any problems you have had passing stool. What are the risks? Generally, this is a safe procedure. However, problems may occur, including:  Bleeding.  A tear in the intestine.  A reaction to medicines given during the exam.  Infection (rare).  What happens before the procedure? Eating and drinking restrictions Follow instructions from your health care provider about eating and drinking, which may include:  A few days before the procedure - follow a low-fiber diet. Avoid nuts, seeds, dried fruit, raw fruits, and vegetables.  1-3 days before the procedure - follow a clear liquid diet. Drink only clear liquids, such as clear broth or bouillon, black coffee or tea, clear juice, clear soft drinks or sports drinks, gelatin dessert, and popsicles. Avoid any liquids that contain red or purple dye.  On the day of the procedure - do not eat or drink anything during the 2 hours before the procedure, or within the time period that your health care provider recommends.  Bowel prep If you were prescribed an oral bowel prep to clean out your colon:  Take it as told by your health care provider. Starting the day before your  procedure, you will need to drink a large amount of medicated liquid. The liquid will cause you to have multiple loose stools until your stool is almost clear or light green.  If your skin or anus gets irritated from diarrhea, you may use these to relieve the irritation: ? Medicated wipes, such as adult wet wipes with aloe and vitamin E. ? A skin soothing-product like petroleum jelly.  If you vomit while drinking the bowel prep, take a break for up to 60 minutes and then begin the bowel prep again. If vomiting continues and you cannot take the bowel prep without vomiting, call your health care provider.  General  instructions  Ask your health care provider about changing or stopping your regular medicines. This is especially important if you are taking diabetes medicines or blood thinners.  Plan to have someone take you home from the hospital or clinic. What happens during the procedure?  An IV tube may be inserted into one of your veins.  You will be given medicine to help you relax (sedative).  To reduce your risk of infection: ? Your health care team will wash or sanitize their hands. ? Your anal area will be washed with soap.  You will be asked to lie on your side with your knees bent.  Your health care provider will lubricate a long, thin, flexible tube. The tube will have a camera and a light on the end.  The tube will be inserted into your anus.  The tube will be gently eased through your rectum and colon.  Air will be delivered into your colon to keep it open. You may feel some pressure or cramping.  The camera will be used to take images during the procedure.  A small tissue sample may be removed from your body to be examined under a microscope (biopsy). If any potential problems are found, the tissue will be sent to a lab for testing.  If small polyps are found, your health care provider may remove them and have them checked for cancer cells.  The tube that was inserted into your anus will be slowly removed. The procedure may vary among health care providers and hospitals. What happens after the procedure?  Your blood pressure, heart rate, breathing rate, and blood oxygen level will be monitored until the medicines you were given have worn off.  Do not drive for 24 hours after the exam.  You may have a small amount of blood in your stool.  You may pass gas and have mild abdominal cramping or bloating due to the air that was used to inflate your colon during the exam.  It is up to you to get the results of your procedure. Ask your health care provider, or the department  performing the procedure, when your results will be ready. This information is not intended to replace advice given to you by your health care provider. Make sure you discuss any questions you have with your health care provider. Document Released: 03/24/2000 Document Revised: 01/26/2016 Document Reviewed: 06/08/2015 Elsevier Interactive Patient Education  2018 [employer]

## 2016-11-02 NOTE — Progress Notes (Signed)
Subjective:   Geralyn FlashLindsay F Rabinovich is a 61 y.o. male who presents for Medicare Annual/Subsequent preventive examination.  Review of Systems:  No ROS.  Medicare Wellness Visit. Additional risk factors are reflected in the social history.  Cardiac Risk Factors include: advanced age (>955men, 45>65 women)     Objective:    Vitals: BP 122/70 (BP Location: Right Arm, Patient Position: Sitting, Cuff Size: Normal)   Pulse 78   Temp 98.3 F (36.8 C) (Oral)   Resp 14   Ht 5\' 9"  (1.753 m)   Wt 131 lb 6.4 oz (59.6 kg)   SpO2 97%   BMI 19.40 kg/m   Body mass index is 19.4 kg/m.  Tobacco History  Smoking Status  . Current Every Day Smoker  . Packs/day: 1.30  . Types: Cigarettes  Smokeless Tobacco  . Never Used     Ready to quit: No Counseling given: No   Past Medical History:  Diagnosis Date  . Allergy   . CAD (coronary artery disease)   . Depression   . Frequent headaches   . GERD (gastroesophageal reflux disease)   . Hyperlipidemia   . Hypertension   . PAD (peripheral artery disease) (HCC)   . Tobacco abuse    Past Surgical History:  Procedure Laterality Date  . PERCUTANEOUS CORONARY STENT INTERVENTION (PCI-S)    . STOMACH SURGERY    . TONSILLECTOMY     Family History  Problem Relation Age of Onset  . Diabetes Mother   . Heart disease Mother   . Hypertension Mother   . Stroke Mother   . Prostate cancer Father   . Diabetes Sister   . Heart disease Sister    History  Sexual Activity  . Sexual activity: Not Currently    Outpatient Encounter Prescriptions as of 11/02/2016  Medication Sig  . aspirin 81 MG tablet Take 81 mg by mouth daily.  Marland Kitchen. atorvastatin (LIPITOR) 80 MG tablet Take 1 tablet (80 mg total) by mouth daily at 6 PM.  . baclofen (LIORESAL) 10 MG tablet Take 0.5 tablets (5 mg total) by mouth 3 (three) times daily.  . clopidogrel (PLAVIX) 75 MG tablet TAKE ONE (1) TABLET EACH DAY  . cyanocobalamin 100 MCG tablet Take 100 mcg by mouth daily.  .  folic acid (FOLVITE) 1 MG tablet Take 1 mg by mouth daily.  Marland Kitchen. ipratropium (ATROVENT) 0.02 % nebulizer solution Inhale into the lungs.  . isosorbide mononitrate (IMDUR) 60 MG 24 hr tablet Take 1 tablet (60 mg total) by mouth daily.  Marland Kitchen. lisinopril (PRINIVIL,ZESTRIL) 5 MG tablet TAKE ONE (1) TABLET EACH DAY  . nitroGLYCERIN (NITROSTAT) 0.4 MG SL tablet Place 1 tablet (0.4 mg total) under the tongue every 5 (five) minutes as needed for chest pain.  Marland Kitchen. oxyCODONE-acetaminophen (PERCOCET) 10-325 MG tablet   . pantoprazole (PROTONIX) 40 MG tablet Take 1 tablet (40 mg total) by mouth daily.  Marland Kitchen. pyridoxine (B-6) 100 MG tablet Take 1 tablet (100 mg total) by mouth daily.  Marland Kitchen. thiamine (VITAMIN B-1) 100 MG tablet Take 1 tablet (100 mg total) by mouth daily.  Marland Kitchen. zolpidem (AMBIEN) 10 MG tablet TAKE ONE TABLET AT BEDTIME  . citalopram (CELEXA) 10 MG tablet Take 1 tablet (10 mg total) by mouth daily. (Patient not taking: Reported on 11/02/2016)   No facility-administered encounter medications on file as of 11/02/2016.     Activities of Daily Living In your present state of health, do you have any difficulty performing the following activities: 11/02/2016  11/03/2015  Hearing? N N  Vision? N N  Difficulty concentrating or making decisions? N N  Walking or climbing stairs? Y Y  Dressing or bathing? N N  Doing errands, shopping? N N  Preparing Food and eating ? N N  Using the Toilet? N N  In the past six months, have you accidently leaked urine? N N  Do you have problems with loss of bowel control? N N  Managing your Medications? N N  Managing your Finances? N N  Housekeeping or managing your Housekeeping? N Y  Some recent data might be hidden    Patient Care Team: Tommie Samsook, Jayce G, DO as PCP - General (Family Medicine)   Assessment:    This is a routine wellness examination for Lillia AbedLindsay. The goal of the wellness visit is to assist the patient how to close the gaps in care and create a preventative care plan for  the patient.   The roster of all physicians providing medical care to patient is listed in the Snapshot section of the chart.  Osteoporosis risk reviewed.    Safety issues reviewed; Smoke and carbon monoxide detectors in the home. No firearms in the home.  Wears seatbelts when driving or riding with others. Patient does wear sunscreen or protective clothing when in direct sunlight. No violence in the home.  Patient is alert, normal appearance, oriented to person/place/and time.  Correctly identified the president of the [place], recall of 3/3 words, and performing simple calculations. Displays appropriate judgement and can read correct time from watch face.   No new identified risk were noted.  No failures at ADL's or IADL's.   Falls; Hx of multiple falls.  High risk due to intermittent leg weakness. Encouraged him to use a cane or walking stick on a daily basis to help prevent falls.  BMI- discussed the importance of a healthy diet, water intake and the benefits of aerobic exercise. He is making healthy choices when eating and tries to eat 3 meals daily.  He is aware of staying hydrated and active for an overall healthy lifestyle.  Educational material provided.   24 hour diet recall: Breakfast: 2 eggs Lunch: none Dinner: chicken, cabbage, beans Snack: icecream Daily fluid intake: 1.5 cups of caffeine, 2 cups of water, 1 cup koolaid  Eye- Visual acuity not assessed per patient preference.  States he has not had an eye exam with ophthalmologist in several years due to unaffordable insurance.  He does wear an older pair of corrective lenses when reading. Financial resource information provided.  Sleep patterns- Sleeps 6 hours at night.  Wakes feeling rested.  Colonoscopy discussed; awaiting feedback from his Cardiologist for clearance.    147.2 Ventricular tachycardia- stable and followed by Cardiology, Dr. Maurine CaneStiber ([facility]), see care everywhere.  Cologuard discussed; request for signature  placed in PCP folder.  Educational material provided.  Patient Concerns: Requests IMDUR medication dosage change from daily to BID and discussion with PCP regarding medication to help him gain weight. Refill Celexa. Deferred to PCP for follow up.  Appointment scheduled for medication management.    Exercise Activities and Dietary recommendations Current Exercise Habits: The patient does not participate in regular exercise at present  Goals    . 3 meals a day          Regular diet    . Healthy Lifestyle          Stay hydrated and drink plenty of fluids. Stretch! Chair exercises recommended with SOB.  Fall Risk Fall Risk  11/02/2016 11/03/2015 09/24/2015 08/24/2015 07/29/2015  Falls in the past year? Yes No No No No  Number falls in past yr: 2 or more - - - -  Injury with Fall? Yes - - - -  Risk Factor Category  High Fall Risk - - - -  Risk for fall due to : - - - - -  Follow up Falls prevention discussed;Education provided - - - -   Depression Screen PHQ 2/9 Scores 11/02/2016 11/03/2015 09/24/2015 08/24/2015  PHQ - 2 Score 0 0 0 0  PHQ- 9 Score 0 - - -    Cognitive Function MMSE - Mini Mental State Exam 11/03/2015  Orientation to time 5  Orientation to Place 5  Registration 3  Attention/ Calculation 5  Recall 3  Language- name 2 objects 2  Language- repeat 1  Language- follow 3 step command 3  Language- read & follow direction 1  Write a sentence 1  Copy design 1  Total score 30     6CIT Screen 11/02/2016  What Year? 0 points  What month? 0 points  What time? 0 points  Count back from 20 0 points  Months in reverse 4 points  Repeat phrase 0 points  Total Score 4     There is no immunization history on file for this patient. Screening Tests Health Maintenance  Topic Date Due  . Hepatitis C Screening  04-May-1955  . HIV Screening  01/02/1970  . TETANUS/TDAP  01/02/1974  . COLONOSCOPY  01/02/2005  . INFLUENZA VACCINE  02/08/2017 (Originally 11/08/2016)       Plan:   End of life planning; Advanced aging; Advanced directives discussed.  No HCPOA/Living Will.  Additional information provided to help them start the conversation with family.  Copy of HCPOA/Living Will requested upon completion. Time spent on this topic is 20 minutes.  I have personally reviewed and noted the following in the patient's chart:   . Medical and social history . Use of alcohol, tobacco or illicit drugs- states he stopped drinking 1 year ago.  He does not plan to quit smoking today.  Cessational material information declined. . Current medications and supplements-pill pack pharmacy phone/fax information provided to patient per his request to follow up on refilled medications. . Functional ability and status . Nutritional status . Physical activity . Advanced directives-provided today. . List of other physicians . Hospitalizations, surgeries, and ER visits in previous 12 months . Vitals . Screenings to include cognitive, depression, and falls . Referrals and appointments  In addition, I have reviewed and discussed with patient certain preventive protocols, quality metrics, and best practice recommendations. A written personalized care plan for preventive services as well as general preventive health recommendations were provided to patient.     OBrien-Blaney, Denisa L, LPN  1/61/0960    I have reviewed the above information and agree with above.   Duncan Dull, MD

## 2016-11-03 DIAGNOSIS — M545 Low back pain: Secondary | ICD-10-CM | POA: Diagnosis not present

## 2016-11-08 ENCOUNTER — Other Ambulatory Visit: Payer: Self-pay

## 2016-11-08 DIAGNOSIS — I700 Atherosclerosis of aorta: Secondary | ICD-10-CM

## 2016-11-08 DIAGNOSIS — I739 Peripheral vascular disease, unspecified: Secondary | ICD-10-CM

## 2016-11-10 DIAGNOSIS — M47816 Spondylosis without myelopathy or radiculopathy, lumbar region: Secondary | ICD-10-CM | POA: Diagnosis not present

## 2016-11-10 DIAGNOSIS — Z79891 Long term (current) use of opiate analgesic: Secondary | ICD-10-CM | POA: Diagnosis not present

## 2016-11-10 DIAGNOSIS — Z79899 Other long term (current) drug therapy: Secondary | ICD-10-CM | POA: Diagnosis not present

## 2016-11-10 DIAGNOSIS — M545 Low back pain: Secondary | ICD-10-CM | POA: Diagnosis not present

## 2016-11-10 DIAGNOSIS — G894 Chronic pain syndrome: Secondary | ICD-10-CM | POA: Diagnosis not present

## 2016-11-10 DIAGNOSIS — M79609 Pain in unspecified limb: Secondary | ICD-10-CM | POA: Diagnosis not present

## 2016-11-13 ENCOUNTER — Ambulatory Visit: Payer: Medicare Other | Admitting: Family Medicine

## 2016-11-13 DIAGNOSIS — Z0289 Encounter for other administrative examinations: Secondary | ICD-10-CM

## 2016-12-04 ENCOUNTER — Other Ambulatory Visit: Payer: Self-pay

## 2016-12-04 MED ORDER — ZOLPIDEM TARTRATE 10 MG PO TABS: 10.0000 mg | ORAL_TABLET | Freq: Every day | ORAL | 1 refills | Status: DC

## 2016-12-04 NOTE — Telephone Encounter (Signed)
Prescription printed. Please fax. Please get patient set up for follow-up in the next several months to evaluate his sleep. Thanks.

## 2016-12-04 NOTE — Telephone Encounter (Signed)
Last office visit 11/02/16 Denisa,  Dr Adriana Simas 08/28/16 Next office visit [facility] 11/05/17

## 2016-12-13 DIAGNOSIS — M545 Low back pain: Secondary | ICD-10-CM | POA: Diagnosis not present

## 2016-12-13 DIAGNOSIS — M47816 Spondylosis without myelopathy or radiculopathy, lumbar region: Secondary | ICD-10-CM | POA: Diagnosis not present

## 2016-12-13 DIAGNOSIS — Z79899 Other long term (current) drug therapy: Secondary | ICD-10-CM | POA: Diagnosis not present

## 2016-12-13 DIAGNOSIS — M79609 Pain in unspecified limb: Secondary | ICD-10-CM | POA: Diagnosis not present

## 2016-12-13 DIAGNOSIS — Z79891 Long term (current) use of opiate analgesic: Secondary | ICD-10-CM | POA: Diagnosis not present

## 2016-12-13 DIAGNOSIS — G894 Chronic pain syndrome: Secondary | ICD-10-CM | POA: Diagnosis not present

## 2016-12-13 DIAGNOSIS — M47817 Spondylosis without myelopathy or radiculopathy, lumbosacral region: Secondary | ICD-10-CM | POA: Diagnosis not present

## 2016-12-29 ENCOUNTER — Other Ambulatory Visit: Payer: Self-pay | Admitting: Family Medicine

## 2016-12-29 ENCOUNTER — Encounter: Payer: Medicare Other | Admitting: Vascular Surgery

## 2016-12-29 ENCOUNTER — Encounter (HOSPITAL_COMMUNITY): Payer: Medicare Other

## 2016-12-29 NOTE — Telephone Encounter (Signed)
Last filled 12/01/16 No office visit scheduled , follow 3-6 months No show 11/13/16

## 2017-01-10 DIAGNOSIS — M545 Low back pain: Secondary | ICD-10-CM | POA: Diagnosis not present

## 2017-01-10 DIAGNOSIS — G894 Chronic pain syndrome: Secondary | ICD-10-CM | POA: Diagnosis not present

## 2017-01-10 DIAGNOSIS — M47816 Spondylosis without myelopathy or radiculopathy, lumbar region: Secondary | ICD-10-CM | POA: Diagnosis not present

## 2017-01-10 DIAGNOSIS — M79609 Pain in unspecified limb: Secondary | ICD-10-CM | POA: Diagnosis not present

## 2017-01-10 DIAGNOSIS — M47817 Spondylosis without myelopathy or radiculopathy, lumbosacral region: Secondary | ICD-10-CM | POA: Diagnosis not present

## 2017-01-22 DIAGNOSIS — I255 Ischemic cardiomyopathy: Secondary | ICD-10-CM | POA: Diagnosis not present

## 2017-01-22 DIAGNOSIS — E782 Mixed hyperlipidemia: Secondary | ICD-10-CM | POA: Diagnosis not present

## 2017-01-22 DIAGNOSIS — I2510 Atherosclerotic heart disease of native coronary artery without angina pectoris: Secondary | ICD-10-CM | POA: Diagnosis not present

## 2017-01-29 ENCOUNTER — Other Ambulatory Visit: Payer: Self-pay

## 2017-01-29 NOTE — Telephone Encounter (Signed)
Last OV 08/28/16 with DR.Cook last filled 12/04/16 30 1rf

## 2017-01-30 MED ORDER — ZOLPIDEM TARTRATE 10 MG PO TABS: 10.0000 mg | ORAL_TABLET | Freq: Every day | ORAL | 0 refills | Status: DC

## 2017-01-30 NOTE — Telephone Encounter (Signed)
1 refill given. Please fax. Patient needs a follow-up set up to establish care for further refills.

## 2017-02-01 NOTE — Telephone Encounter (Signed)
Left message to return call 

## 2017-02-09 DIAGNOSIS — M79609 Pain in unspecified limb: Secondary | ICD-10-CM | POA: Diagnosis not present

## 2017-02-09 DIAGNOSIS — M545 Low back pain: Secondary | ICD-10-CM | POA: Diagnosis not present

## 2017-02-09 DIAGNOSIS — M47816 Spondylosis without myelopathy or radiculopathy, lumbar region: Secondary | ICD-10-CM | POA: Diagnosis not present

## 2017-02-09 DIAGNOSIS — Z79891 Long term (current) use of opiate analgesic: Secondary | ICD-10-CM | POA: Diagnosis not present

## 2017-02-09 DIAGNOSIS — Z79899 Other long term (current) drug therapy: Secondary | ICD-10-CM | POA: Diagnosis not present

## 2017-02-09 DIAGNOSIS — G894 Chronic pain syndrome: Secondary | ICD-10-CM | POA: Diagnosis not present

## 2017-02-13 NOTE — Telephone Encounter (Signed)
Patient notified and scheduled 

## 2017-02-23 ENCOUNTER — Encounter: Payer: Self-pay | Admitting: Internal Medicine

## 2017-02-23 ENCOUNTER — Ambulatory Visit (INDEPENDENT_AMBULATORY_CARE_PROVIDER_SITE_OTHER): Payer: Medicare Other | Admitting: Internal Medicine

## 2017-02-23 VITALS — BP 122/84 | HR 74 | Temp 97.8°F | Resp 16 | Ht 70.0 in | Wt 133.1 lb

## 2017-02-23 DIAGNOSIS — K921 Melena: Secondary | ICD-10-CM

## 2017-02-23 DIAGNOSIS — Z113 Encounter for screening for infections with a predominantly sexual mode of transmission: Secondary | ICD-10-CM | POA: Diagnosis not present

## 2017-02-23 DIAGNOSIS — R935 Abnormal findings on diagnostic imaging of other abdominal regions, including retroperitoneum: Secondary | ICD-10-CM | POA: Diagnosis not present

## 2017-02-23 DIAGNOSIS — Z125 Encounter for screening for malignant neoplasm of prostate: Secondary | ICD-10-CM

## 2017-02-23 DIAGNOSIS — K219 Gastro-esophageal reflux disease without esophagitis: Secondary | ICD-10-CM | POA: Diagnosis not present

## 2017-02-23 DIAGNOSIS — Z13220 Encounter for screening for lipoid disorders: Secondary | ICD-10-CM

## 2017-02-23 DIAGNOSIS — I2510 Atherosclerotic heart disease of native coronary artery without angina pectoris: Secondary | ICD-10-CM | POA: Diagnosis not present

## 2017-02-23 DIAGNOSIS — R6881 Early satiety: Secondary | ICD-10-CM

## 2017-02-23 DIAGNOSIS — H547 Unspecified visual loss: Secondary | ICD-10-CM | POA: Diagnosis not present

## 2017-02-23 DIAGNOSIS — I10 Essential (primary) hypertension: Secondary | ICD-10-CM

## 2017-02-23 DIAGNOSIS — Z1159 Encounter for screening for other viral diseases: Secondary | ICD-10-CM

## 2017-02-23 DIAGNOSIS — Z1211 Encounter for screening for malignant neoplasm of colon: Secondary | ICD-10-CM | POA: Diagnosis not present

## 2017-02-23 DIAGNOSIS — Z1329 Encounter for screening for other suspected endocrine disorder: Secondary | ICD-10-CM | POA: Diagnosis not present

## 2017-02-23 LAB — COMPREHENSIVE METABOLIC PANEL
ALBUMIN: 4.3 g/dL (ref 3.5–5.2)
ALT: 13 U/L (ref 0–53)
AST: 21 U/L (ref 0–37)
Alkaline Phosphatase: 57 U/L (ref 39–117)
BILIRUBIN TOTAL: 0.3 mg/dL (ref 0.2–1.2)
BUN: 19 mg/dL (ref 6–23)
CALCIUM: 9.3 mg/dL (ref 8.4–10.5)
CO2: 27 mEq/L (ref 19–32)
CREATININE: 0.86 mg/dL (ref 0.40–1.50)
Chloride: 106 mEq/L (ref 96–112)
GFR: 95.73 mL/min (ref >60.00)
Glucose, Bld: 81 mg/dL (ref 70–99)
Potassium: 4.4 mEq/L (ref 3.5–5.1)
Sodium: 142 mEq/L (ref 135–145)
Total Protein: 6.9 g/dL (ref 6.0–8.3)

## 2017-02-23 LAB — LIPID PANEL
CHOL/HDL RATIO: 3
Cholesterol: 108 mg/dL (ref 0–200)
HDL: 34.2 mg/dL — AB (ref >39.00)
LDL Cholesterol: 57 mg/dL (ref 0–99)
NonHDL: 73.45
TRIGLYCERIDES: 80 mg/dL (ref 0.0–149.0)
VLDL: 16 mg/dL (ref 0.0–40.0)

## 2017-02-23 LAB — CBC WITH DIFFERENTIAL/PLATELET
BASOS ABS: 0 10*3/uL (ref 0.0–0.1)
Basophils Relative: 0.6 % (ref 0.0–3.0)
EOS ABS: 0.1 10*3/uL (ref 0.0–0.7)
Eosinophils Relative: 1.5 % (ref 0.0–5.0)
HEMATOCRIT: 44.6 % (ref 39.0–52.0)
HEMOGLOBIN: 14.3 g/dL (ref 13.0–17.0)
LYMPHS PCT: 27 % (ref 12.0–46.0)
Lymphs Abs: 1.5 10*3/uL (ref 0.7–4.0)
MCHC: 32.1 g/dL (ref 30.0–36.0)
MCV: 91.1 fl (ref 78.0–100.0)
Monocytes Absolute: 0.5 10*3/uL (ref 0.1–1.0)
Monocytes Relative: 9 % (ref 3.0–12.0)
Neutro Abs: 3.5 10*3/uL (ref 1.4–7.7)
Neutrophils Relative %: 61.9 % (ref 43.0–77.0)
Platelets: 260 10*3/uL (ref 150.0–400.0)
RBC: 4.9 Mil/uL (ref 4.22–5.81)
RDW: 14.4 % (ref 11.5–15.5)
WBC: 5.6 10*3/uL (ref 4.0–10.5)

## 2017-02-23 LAB — T4, FREE: FREE T4: 0.84 ng/dL (ref 0.60–1.60)

## 2017-02-23 LAB — POCT URINALYSIS DIPSTICK
BILIRUBIN UA: NEGATIVE
Glucose, UA: NEGATIVE
KETONES UA: NEGATIVE
LEUKOCYTES UA: NEGATIVE
Nitrite, UA: NEGATIVE
PH UA: 6 (ref 5.0–8.0)
Protein, UA: NEGATIVE
SPEC GRAV UA: 1.015 (ref 1.010–1.025)
Urobilinogen, UA: 0.2 E.U./dL

## 2017-02-23 LAB — TSH: TSH: 1.32 u[IU]/mL (ref 0.35–4.50)

## 2017-02-23 LAB — PSA: PSA: 2.14 ng/mL (ref 0.10–4.00)

## 2017-02-23 NOTE — Patient Instructions (Signed)
We will refer to Dr. Servando [facility] for colonoscopy and work up of abnormal CT abdomen from 02/2016   Labs today   Follow up in 1-2  months  Check insurance about eye exam    Heartburn Heartburn is a type of pain or discomfort that can happen in the throat or chest. It is often described as a burning pain. It may also cause a bad taste in the mouth. Heartburn may feel worse when you lie down or bend over. It may be caused by stomach contents that move back up (reflux) into the tube that connects the mouth with the stomach (esophagus). Follow these instructions at home: Take these actions to lessen your discomfort and to help avoid problems. Diet  Follow a diet as told by your doctor. You may need to avoid foods and drinks such as: ? Coffee and tea (with or without caffeine). ? Drinks that contain alcohol. ? Energy drinks and sports drinks. ? Carbonated drinks or sodas. ? Chocolate and cocoa. ? Peppermint and mint flavorings. ? Garlic and onions. ? Horseradish. ? Spicy and acidic foods, such as peppers, chili powder, curry powder, vinegar, hot sauces, and BBQ sauce. ? Citrus fruit juices and citrus fruits, such as oranges, lemons, and limes. ? Tomato-based foods, such as red sauce, chili, salsa, and pizza with red sauce. ? Fried and fatty foods, such as donuts, french fries, potato chips, and high-fat dressings. ? High-fat meats, such as hot dogs, rib eye steak, sausage, ham, and bacon. ? High-fat dairy items, such as whole milk, butter, and cream cheese.  Eat small meals often. Avoid eating large meals.  Avoid drinking large amounts of liquid with your meals.  Avoid eating meals during the 2-3 hours before bedtime.  Avoid lying down right after you eat.  Do not exercise right after you eat. General instructions  Pay attention to any changes in your symptoms.  Take over-the-counter and prescription medicines only as told by your doctor. Do not take aspirin, ibuprofen, or other  NSAIDs unless your doctor says it is okay.  Do not use any tobacco products, including cigarettes, chewing tobacco, and e-cigarettes. If you need help quitting, ask your doctor.  Wear loose clothes. Do not wear anything tight around your waist.  Raise (elevate) the head of your bed about 6 inches (15 cm).  Try to lower your stress. If you need help doing this, ask your doctor.  If you are overweight, lose an amount of weight that is healthy for you. Ask your doctor about a safe weight loss goal.  Keep all follow-up visits as told by your doctor. This is important. Contact a doctor if:  You have new symptoms.  You lose weight and you do not know why it is happening.  You have trouble swallowing, or it hurts to swallow.  You have wheezing or a cough that keeps happening.  Your symptoms do not get better with treatment.  You have heartburn often for more than two weeks. Get help right away if:  You have pain in your arms, neck, jaw, teeth, or back.  You feel sweaty, dizzy, or light-headed.  You have chest pain or shortness of breath.  You throw up (vomit) and your throw up looks like blood or coffee grounds.  Your poop (stool) is bloody or black. This information is not intended to replace advice given to you by your health care provider. Make sure you discuss any questions you have with your health care provider. Document Released:  12/07/2010 Document Revised: 09/02/2015 Document Reviewed: 07/22/2014 Elsevier Interactive Patient Education  2018 [employer] Colonoscopy, Adult, Care After This sheet gives you information about how to care for yourself after your procedure. Your doctor may also give you more specific instructions. If you have problems or questions, call your doctor. Follow these instructions at home: General instructions   For the first 24 hours after the procedure: ? Do not drive or use machinery. ? Do not sign important documents. ? Do not drink  alcohol. ? Do your daily activities more slowly than normal. ? Eat foods that are soft and easy to digest. ? Rest often.  Take over-the-counter or prescription medicines only as told by your doctor.  It is up to you to get the results of your procedure. Ask your doctor, or the department performing the procedure, when your results will be ready. To help cramping and bloating:  Try walking around.  Put heat on your belly (abdomen) as told by your doctor. Use a heat source that your doctor recommends, such as a moist heat pack or a heating pad. ? Put a towel between your skin and the heat source. ? Leave the heat on for 20-30 minutes. ? Remove the heat if your skin turns bright red. This is especially important if you cannot feel pain, heat, or cold. You can get burned. Eating and drinking  Drink enough fluid to keep your pee (urine) clear or pale yellow.  Return to your normal diet as told by your doctor. Avoid heavy or fried foods that are hard to digest.  Avoid drinking alcohol for as long as told by your doctor. Contact a doctor if:  You have blood in your poop (stool) 2-3 days after the procedure. Get help right away if:  You have more than a small amount of blood in your poop.  You see large clumps of tissue (blood clots) in your poop.  Your belly is swollen.  You feel sick to your stomach (nauseous).  You throw up (vomit).  You have a fever.  You have belly pain that gets worse, and medicine does not help your pain. This information is not intended to replace advice given to you by your health care provider. Make sure you discuss any questions you have with your health care provider. Document Released: 04/29/2010 Document Revised: 12/20/2015 Document Reviewed: 12/20/2015 Elsevier Interactive Patient Education  2017 [employer]

## 2017-02-24 LAB — HEPATITIS C ANTIBODY
HEP C AB: NONREACTIVE
SIGNAL TO CUT-OFF: 0.04 (ref <1.00)

## 2017-02-24 LAB — HEPATITIS B CORE ANTIBODY, TOTAL: Hep B Core Total Ab: NONREACTIVE

## 2017-02-24 LAB — HEPATITIS B SURFACE ANTIGEN: Hepatitis B Surface Ag: NONREACTIVE

## 2017-02-24 LAB — HEPATITIS B SURFACE ANTIBODY, QUANTITATIVE: Hepatitis B-Post: <5 m[IU]/mL — ABNORMAL LOW (ref >=10)

## 2017-02-25 ENCOUNTER — Encounter: Payer: Self-pay | Admitting: Internal Medicine

## 2017-02-25 DIAGNOSIS — R6881 Early satiety: Secondary | ICD-10-CM | POA: Insufficient documentation

## 2017-02-25 DIAGNOSIS — R935 Abnormal findings on diagnostic imaging of other abdominal regions, including retroperitoneum: Secondary | ICD-10-CM | POA: Insufficient documentation

## 2017-02-25 DIAGNOSIS — H547 Unspecified visual loss: Secondary | ICD-10-CM | POA: Insufficient documentation

## 2017-02-25 HISTORY — DX: Early satiety: R68.81

## 2017-02-25 HISTORY — DX: Abnormal findings on diagnostic imaging of other abdominal regions, including retroperitoneum: R93.5

## 2017-02-25 NOTE — Progress Notes (Signed)
Chief Complaint  Patient presents with  . Establish Care    Previous PCP Dr. Everlene Other [facility]  . Gastroesophageal Reflux    uncontrolled   Pt presents for f/u to Establish with new provider previous provider Dr. Everlene Other  1. He c/o uncontrolled GERD on protonix 40 mg but does not want to increase the dose after reviewing potential side effects.  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 Reviewed H pyloir 01/06/16 negative. Pt denies wt loss, but had h/o night sweats 5-6 months ago and c/o normal appetite but early satiety. He has not seen GI and has never had colonoscopy but possibly had abdominal surgery ~ 9 years ago and admitted to [facility] will need to review hx further. After further review of chart was AAA repair  2. He c/o difficulty seeing at times but reports insurance may not cover vision exam asked pt to check with optometry at [facility] if thew will not see him will figure out in future   Gastroesophageal Reflux  He complains of early satiety and heartburn. He reports no abdominal pain or no chest pain. This is a chronic problem. The current episode started more than 1 year ago. The problem occurs frequently. The problem has been gradually worsening. Associated symptoms include melena. Pertinent negatives include no weight loss. Risk factors include smoking/tobacco exposure. He has tried a PPI for the symptoms. The treatment provided no relief. Past procedures do not include H. pylori antibody titer.   Review of Systems  Constitutional: Negative for weight loss.  Eyes:       Vision decreased   Respiratory: Negative for shortness of breath.   Cardiovascular: Negative for chest pain.  Gastrointestinal: Positive for blood in stool, heartburn and melena. Negative for abdominal pain.       +early satiety    Past Medical History:  Diagnosis Date  . Allergy   . CAD (coronary artery disease)    . Depression   . Frequent headaches   . GERD (gastroesophageal reflux disease)   . Headache   . Hyperlipidemia   . Hypertension   . PAD (peripheral artery disease) (HCC)   . Tobacco abuse    Past Surgical History:  Procedure Laterality Date  . PERCUTANEOUS CORONARY STENT INTERVENTION (PCI-S)    . STOMACH SURGERY     ? stomach vs colon per pt reports h/o ?SBP and repair ~2009 [facility]   . TONSILLECTOMY     Family History  Problem Relation Age of Onset  . Diabetes Mother   . Heart disease Mother   . Hypertension Mother   . Stroke Mother   . Prostate cancer Father   . Cancer Father        prostate cancer in 52s  . Diabetes Sister   . Heart disease Sister   . Ulcers Other        unknown who had ulcers in family    Social History   Socioeconomic History  . Marital status: Married    Spouse name: Not on file  . Number of children: Not on file  . Years of education: Not on file  . Highest education level: Not on file  Social Needs  . Financial resource strain: Not on file  . Food insecurity - worry: Not on file  . Food insecurity - inability: Not on file  . Transportation needs - medical: Not on file  .  Transportation needs - non-medical: Not on file  Occupational History  . Not on file  Tobacco Use  . Smoking status: Current Every Day Smoker    Packs/day: 1.30    Types: Cigarettes  . Smokeless tobacco: Never Used  Substance and Sexual Activity  . Alcohol use: No    Alcohol/week: 0.0 oz    Comment: quit 1 year ago  . Drug use: No  . Sexual activity: Not Currently  Other Topics Concern  . Not on file  Social History Narrative  . Not on file   Current Meds  Medication Sig  . aspirin 81 MG tablet Take 81 mg by mouth daily.  Marland Kitchen atorvastatin (LIPITOR) 80 MG tablet Take 1 tablet (80 mg total) by mouth daily at 6 PM.  . baclofen (LIORESAL) 10 MG tablet Take 0.5 tablets (5 mg total) by mouth 3 (three) times daily.  . Cholecalciferol (VITAMIN D3) 1000 units CAPS Take  by mouth.  . citalopram (CELEXA) 10 MG tablet Take 1 tablet (10 mg total) by mouth daily.  . clopidogrel (PLAVIX) 75 MG tablet TAKE ONE (1) TABLET EACH DAY  . cyanocobalamin 100 MCG tablet Take 100 mcg by mouth daily.  . folic acid (FOLVITE) 1 MG tablet Take 1 mg by mouth daily.  Marland Kitchen ipratropium (ATROVENT) 0.02 % nebulizer solution Inhale into the lungs.  . isosorbide mononitrate (IMDUR) 30 MG 24 hr tablet Take 30 mg 2 (two) times daily by mouth.   Marland Kitchen lisinopril (PRINIVIL,ZESTRIL) 5 MG tablet TAKE ONE (1) TABLET EACH DAY  . Melatonin 3 MG TABS Take 3 mg by mouth.  . nitroGLYCERIN (NITROSTAT) 0.4 MG SL tablet Place 1 tablet (0.4 mg total) under the tongue every 5 (five) minutes as needed for chest pain.  Marland Kitchen oxyCODONE-acetaminophen (PERCOCET) 10-325 MG tablet   . pantoprazole (PROTONIX) 40 MG tablet Take 1 tablet (40 mg total) by mouth daily.  Marland Kitchen pyridoxine (B-6) 100 MG tablet Take 1 tablet (100 mg total) by mouth daily.  Marland Kitchen ZOHYDRO ER 15 MG C12A   . zolpidem (AMBIEN) 10 MG tablet Take 1 tablet (10 mg total) by mouth at bedtime.  . [DISCONTINUED] isosorbide mononitrate (IMDUR) 60 MG 24 hr tablet Take 1 tablet (60 mg total) by mouth daily. (Patient taking differently: Take 30 mg 2 (two) times daily by mouth. )  . [DISCONTINUED] thiamine (VITAMIN B-1) 100 MG tablet Take 1 tablet (100 mg total) by mouth daily.   No Known Allergies Vitals:   02/23/17 0908  Weight: 133 lb 2 oz (60.4 kg)  Height: 5\' 10"  (1.778 m)   Recent Results (from the past 2160 hour(s))  CBC with Differential/Platelet     Status: None   Collection Time: 02/23/17  9:54 AM  Result Value Ref Range   WBC 5.6 4.0 - 10.5 K/uL   RBC 4.90 4.22 - 5.81 Mil/uL   Hemoglobin 14.3 13.0 - 17.0 g/dL   HCT 16.1 09.6 - 04.5 %   MCV 91.1 78.0 - 100.0 fl   MCHC 32.1 30.0 - 36.0 g/dL   RDW 40.9 81.1 - 91.4 %   Platelets 260.0 150.0 - 400.0 K/uL   Neutrophils Relative % 61.9 43.0 - 77.0 %   Lymphocytes Relative 27.0 12.0 - 46.0 %   Monocytes  Relative 9.0 3.0 - 12.0 %   Eosinophils Relative 1.5 0.0 - 5.0 %   Basophils Relative 0.6 0.0 - 3.0 %   Neutro Abs 3.5 1.4 - 7.7 K/uL   Lymphs Abs 1.5 0.7 -  4.0 K/uL   Monocytes Absolute 0.5 0.1 - 1.0 K/uL   Eosinophils Absolute 0.1 0.0 - 0.7 K/uL   Basophils Absolute 0.0 0.0 - 0.1 K/uL  Comprehensive metabolic panel     Status: None   Collection Time: 02/23/17  9:54 AM  Result Value Ref Range   Sodium 142 135 - 145 mEq/L   Potassium 4.4 3.5 - 5.1 mEq/L   Chloride 106 96 - 112 mEq/L   CO2 27 19 - 32 mEq/L   Glucose, Bld 81 70 - 99 mg/dL   BUN 19 6 - 23 mg/dL   Creatinine, Ser 4.09 0.40 - 1.50 mg/dL   Total Bilirubin 0.3 0.2 - 1.2 mg/dL   Alkaline Phosphatase 57 39 - 117 U/L   AST 21 0 - 37 U/L   ALT 13 0 - 53 U/L   Total Protein 6.9 6.0 - 8.3 g/dL   Albumin 4.3 3.5 - 5.2 g/dL   Calcium 9.3 8.4 - 81.1 mg/dL   GFR 91.47 >82.95 mL/min  PSA     Status: None   Collection Time: 02/23/17  9:54 AM  Result Value Ref Range   PSA 2.14 0.10 - 4.00 ng/mL    Comment: Test performed using Access Hybritech PSA Assay, a parmagnetic partical, chemiluminecent immunoassay.  TSH     Status: None   Collection Time: 02/23/17  9:54 AM  Result Value Ref Range   TSH 1.32 0.35 - 4.50 uIU/mL  T4, free     Status: None   Collection Time: 02/23/17  9:54 AM  Result Value Ref Range   Free T4 0.84 0.60 - 1.60 ng/dL    Comment: Specimens from patients who are undergoing biotin therapy and /or ingesting biotin supplements may contain high levels of biotin.  The higher biotin concentration in these specimens interferes with this Free T4 assay.  Specimens that contain high levels  of biotin may cause false high results for this Free T4 assay.  Please interpret results in light of the total clinical presentation of the patient.    Hepatitis B surface antigen     Status: None   Collection Time: 02/23/17  9:54 AM  Result Value Ref Range   Hepatitis B Surface Ag NON-REACTIVE NON-REACTI  Hepatitis C antibody      Status: None   Collection Time: 02/23/17  9:54 AM  Result Value Ref Range   Hepatitis C Ab NON-REACTIVE NON-REACTI   SIGNAL TO CUT-OFF 0.04 <1.00  Lipid panel     Status: Abnormal   Collection Time: 02/23/17  9:54 AM  Result Value Ref Range   Cholesterol 108 0 - 200 mg/dL    Comment: ATP III Classification       Desirable:  < 200 mg/dL               Borderline High:  200 - 239 mg/dL          High:  > = 621 mg/dL   Triglycerides 30.8 0.0 - 149.0 mg/dL    Comment: Normal:  <657 mg/dLBorderline High:  150 - 199 mg/dL   HDL 84.69 (L) >62.95 mg/dL   VLDL 28.4 0.0 - 13.2 mg/dL   LDL Cholesterol 57 0 - 99 mg/dL   Total CHOL/HDL Ratio 3     Comment:                Men          Women1/2 Average Risk     3.4  3.3Average Risk          5.0          4.42X Average Risk          9.6          7.13X Average Risk          15.0          11.0                       NonHDL 73.45     Comment: NOTE:  Non-HDL goal should be 30 mg/dL higher than patient's LDL goal (i.e. LDL goal of < 70 mg/dL, would have non-HDL goal of < 100 mg/dL)  Hepatitis B surface antibody     Status: Abnormal   Collection Time: 02/23/17  9:58 AM  Result Value Ref Range   Hepatitis B-Post <5 (L) > OR = 10 mIU/mL    Comment: . Patient does not have immunity to hepatitis B virus. . For additional information, please refer to http://education.questdiagnostics.com/faq/FAQ105 (This link is being provided for informational/ educational purposes only).   Hepatitis B core antibody, total     Status: None   Collection Time: 02/23/17  9:58 AM  Result Value Ref Range   Hep B Core Total Ab NON-REACTIVE NON-REACTI  POCT urinalysis dipstick     Status: Abnormal   Collection Time: 02/23/17 10:11 AM  Result Value Ref Range   Color, UA yellow    Clarity, UA clear    Glucose, UA negative    Bilirubin, UA negative    Ketones, UA negative    Spec Grav, UA 1.015 1.010 - 1.025   Blood, UA large    pH, UA 6.0 5.0 - 8.0   Protein, UA  negative    Urobilinogen, UA 0.2 0.2 or 1.0 E.U./dL   Nitrite, UA negative    Leukocytes, UA Negative Negative   Vitals:   02/23/17 0908  BP: 122/84  Pulse: 74  Resp: 16  Temp: 97.8 F (36.6 C)  SpO2: 96%   Filed Weights   02/23/17 0908  Weight: 133 lb 2 oz (60.4 kg)   Body mass index is 19.1 kg/m.   Objective  Physical Exam  Constitutional: He is oriented to person, place, and time. Vital signs are normal.  +thin body habitus  Well developed   HENT:  Head: Normocephalic and atraumatic.  Mouth/Throat: Oropharynx is clear and moist and mucous membranes are normal.  Poor dentition   Eyes: Conjunctivae are normal. Pupils are equal, round, and reactive to light.  Cardiovascular: Normal rate, regular rhythm and normal heart sounds.  Pulmonary/Chest: Effort normal and breath sounds normal.  Abdominal: Soft. Bowel sounds are normal. There is no tenderness.  Abdominal scar s/p previous surgery   Neurological: He is alert and oriented to person, place, and time.  Skin: Skin is warm, dry and intact.  Psychiatric: Mood, memory, affect and judgment normal.  Nursing note and vitals reviewed.  Assessment   1. 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 Previous H pylori neg 01/06/16  2. Uncontrolled GERD  3. H/o blood in stool  4. Early satiety  5. Decreased vision  6. HM 7. CAD stable   Plan   1-4. Refer to GI Dr. Servando SnareWohl. After reviewing chart [facility] Dr[facility] Markham JordanElliot x 1 in 02/2016 and PET scan was ordered 02/2016 but he was lost to follow up  Pt does not want to increase  PPI after side effects reviewed  Pt may need EGD/Colonoscopy Also CT ab/pelvis (02/2016) rec PET/CT +/- CT enterography    5. Rec eye exam in future. Pt to check to see locations where insurance will cover try [facility]   6. Declines flu shot and pna 23 vx. Will disc shingrix, Tdap in future  Check CMET, CBC, UA, TSH, free T4, lipid, declines HIV check will check Hep B/C  status, and PSA   Will need to do DRE in future   Refer to GI to decide on colonoscopy pt has never had  H/o smoking disc CT chest low dose screening lung cancer for pt to think about he does not currently want to pursue  7. Pt did not change in Imdur from 60ER qd to 30 ER bid [facility] Noted   Provider: Dr. French Anaracy McLean-Scocuzza

## 2017-02-27 ENCOUNTER — Other Ambulatory Visit: Payer: Self-pay | Admitting: Family Medicine

## 2017-02-28 MED ORDER — BACLOFEN 10 MG PO TABS: 5.0000 mg | ORAL_TABLET | Freq: Two times a day (BID) | ORAL | 2 refills | Status: DC

## 2017-02-28 NOTE — Addendum Note (Signed)
Addended by: Quentin OreMCLEAN-SCOCUZZA, Andray Assefa on: 02/28/2017 04:59 PM   Modules accepted: Orders

## 2017-02-28 NOTE — Telephone Encounter (Signed)
Patient is establishing care with Dr.Mclean last OV 02/23/17 last filled 12/29/16 90 0rf

## 2017-03-06 ENCOUNTER — Other Ambulatory Visit: Payer: Self-pay

## 2017-03-06 MED ORDER — ZOLPIDEM TARTRATE 10 MG PO TABS
10.0000 mg | ORAL_TABLET | Freq: Every evening | ORAL | 2 refills | Status: DC | PRN
Start: 2017-03-06 — End: 2017-05-28

## 2017-03-06 NOTE — Telephone Encounter (Signed)
Last filled 02/27/17 Requesting to be filled on  03/24/17 due to mail order per [facility] spoke with Lilyan PuntKevin C.

## 2017-03-06 NOTE — Telephone Encounter (Signed)
Please phone or fax [facility]. Only change is I want him to take as needed and not rely on it nightly  Thanks TMS

## 2017-03-07 ENCOUNTER — Telehealth: Payer: Self-pay

## 2017-03-07 NOTE — Telephone Encounter (Signed)
Copied from CRM [phone]. Topic: Inquiry >> Mar 07, 2017  9:57 AM Everardo PacificMoton, [place], [place] wrote: Reason for CRM: Patient had a missed call from the office and would like a call back.

## 2017-03-07 NOTE — Addendum Note (Signed)
Addended by: Alisia FerrariBARE, Elasia Furnish C on: 03/07/2017 02:24 PM   Modules accepted: Orders

## 2017-03-07 NOTE — Telephone Encounter (Addendum)
Patient advised of below and verbalized understanding, to take ambien prn.   Called script into [facility] spoke with Isidoro Donningnton .

## 2017-03-07 NOTE — Telephone Encounter (Signed)
Pt returning the call. [phone]. Tried to call office.

## 2017-03-07 NOTE — Telephone Encounter (Signed)
Left message to return call, ok for [facility] to  speak to patient  

## 2017-03-07 NOTE — Telephone Encounter (Signed)
Please advise 

## 2017-03-07 NOTE — Telephone Encounter (Signed)
Left voice mail to call back, HIPPA complaint to confirm .  

## 2017-03-08 ENCOUNTER — Ambulatory Visit (INDEPENDENT_AMBULATORY_CARE_PROVIDER_SITE_OTHER): Payer: Medicare Other

## 2017-03-08 DIAGNOSIS — Z23 Encounter for immunization: Secondary | ICD-10-CM | POA: Diagnosis not present

## 2017-03-08 NOTE — Progress Notes (Signed)
Pt received the 1st dose of the Hepatitis B vaccine. It was given in the left deltoid. Pt voiced no concern or showed any distress while getting the vaccine.

## 2017-03-08 NOTE — Telephone Encounter (Addendum)
Patient advised 03/07/17 see telephone encounter

## 2017-03-12 DIAGNOSIS — M5136 Other intervertebral disc degeneration, lumbar region: Secondary | ICD-10-CM | POA: Diagnosis not present

## 2017-03-12 DIAGNOSIS — M545 Low back pain: Secondary | ICD-10-CM | POA: Diagnosis not present

## 2017-03-12 DIAGNOSIS — M47817 Spondylosis without myelopathy or radiculopathy, lumbosacral region: Secondary | ICD-10-CM | POA: Diagnosis not present

## 2017-03-12 DIAGNOSIS — G894 Chronic pain syndrome: Secondary | ICD-10-CM | POA: Diagnosis not present

## 2017-03-14 ENCOUNTER — Ambulatory Visit: Payer: Medicare Other | Admitting: Gastroenterology

## 2017-03-14 ENCOUNTER — Encounter: Payer: Self-pay | Admitting: Gastroenterology

## 2017-04-09 ENCOUNTER — Ambulatory Visit: Payer: Medicare Other

## 2017-04-12 DIAGNOSIS — Z79899 Other long term (current) drug therapy: Secondary | ICD-10-CM | POA: Diagnosis not present

## 2017-04-12 DIAGNOSIS — G894 Chronic pain syndrome: Secondary | ICD-10-CM | POA: Diagnosis not present

## 2017-04-12 DIAGNOSIS — M545 Low back pain: Secondary | ICD-10-CM | POA: Diagnosis not present

## 2017-04-12 DIAGNOSIS — Z79891 Long term (current) use of opiate analgesic: Secondary | ICD-10-CM | POA: Diagnosis not present

## 2017-04-12 DIAGNOSIS — M47816 Spondylosis without myelopathy or radiculopathy, lumbar region: Secondary | ICD-10-CM | POA: Diagnosis not present

## 2017-04-12 DIAGNOSIS — M79609 Pain in unspecified limb: Secondary | ICD-10-CM | POA: Diagnosis not present

## 2017-05-14 DIAGNOSIS — Z4502 Encounter for adjustment and management of automatic implantable cardiac defibrillator: Secondary | ICD-10-CM | POA: Diagnosis not present

## 2017-05-14 DIAGNOSIS — I472 Ventricular tachycardia: Secondary | ICD-10-CM | POA: Diagnosis not present

## 2017-05-14 DIAGNOSIS — Z79899 Other long term (current) drug therapy: Secondary | ICD-10-CM | POA: Diagnosis not present

## 2017-05-14 DIAGNOSIS — Z79891 Long term (current) use of opiate analgesic: Secondary | ICD-10-CM | POA: Diagnosis not present

## 2017-05-14 DIAGNOSIS — Z95810 Presence of automatic (implantable) cardiac defibrillator: Secondary | ICD-10-CM | POA: Diagnosis not present

## 2017-05-14 DIAGNOSIS — M545 Low back pain: Secondary | ICD-10-CM | POA: Diagnosis not present

## 2017-05-14 DIAGNOSIS — I255 Ischemic cardiomyopathy: Secondary | ICD-10-CM | POA: Diagnosis not present

## 2017-05-14 DIAGNOSIS — M79609 Pain in unspecified limb: Secondary | ICD-10-CM | POA: Diagnosis not present

## 2017-05-14 DIAGNOSIS — M47816 Spondylosis without myelopathy or radiculopathy, lumbar region: Secondary | ICD-10-CM | POA: Diagnosis not present

## 2017-05-14 DIAGNOSIS — I5022 Chronic systolic (congestive) heart failure: Secondary | ICD-10-CM | POA: Diagnosis not present

## 2017-05-14 DIAGNOSIS — G894 Chronic pain syndrome: Secondary | ICD-10-CM | POA: Diagnosis not present

## 2017-05-17 ENCOUNTER — Ambulatory Visit: Payer: Medicare Other | Admitting: Internal Medicine

## 2017-05-17 DIAGNOSIS — Z0289 Encounter for other administrative examinations: Secondary | ICD-10-CM

## 2017-05-22 ENCOUNTER — Ambulatory Visit (INDEPENDENT_AMBULATORY_CARE_PROVIDER_SITE_OTHER): Payer: Medicare Other | Admitting: Internal Medicine

## 2017-05-22 ENCOUNTER — Encounter: Payer: Self-pay | Admitting: Internal Medicine

## 2017-05-22 ENCOUNTER — Other Ambulatory Visit: Payer: Self-pay

## 2017-05-22 VITALS — BP 130/82 | HR 80 | Temp 98.3°F | Ht 70.0 in | Wt 137.2 lb

## 2017-05-22 DIAGNOSIS — R935 Abnormal findings on diagnostic imaging of other abdominal regions, including retroperitoneum: Secondary | ICD-10-CM | POA: Diagnosis not present

## 2017-05-22 DIAGNOSIS — R634 Abnormal weight loss: Secondary | ICD-10-CM | POA: Diagnosis not present

## 2017-05-22 DIAGNOSIS — R319 Hematuria, unspecified: Secondary | ICD-10-CM

## 2017-05-22 DIAGNOSIS — R6881 Early satiety: Secondary | ICD-10-CM | POA: Diagnosis not present

## 2017-05-22 DIAGNOSIS — K921 Melena: Secondary | ICD-10-CM

## 2017-05-22 DIAGNOSIS — Z720 Tobacco use: Secondary | ICD-10-CM | POA: Diagnosis not present

## 2017-05-22 DIAGNOSIS — Z23 Encounter for immunization: Secondary | ICD-10-CM

## 2017-05-22 DIAGNOSIS — R109 Unspecified abdominal pain: Secondary | ICD-10-CM

## 2017-05-22 LAB — URINALYSIS, ROUTINE W REFLEX MICROSCOPIC
Bilirubin Urine: NEGATIVE
Ketones, ur: NEGATIVE
Leukocytes, UA: NEGATIVE
Nitrite: NEGATIVE
PH: 5.5 (ref 5.0–8.0)
TOTAL PROTEIN, URINE-UPE24: NEGATIVE
Urine Glucose: 100 — AB
Urobilinogen, UA: 0.2 (ref 0.0–1.0)

## 2017-05-22 LAB — COMPREHENSIVE METABOLIC PANEL
ALBUMIN: 4.3 g/dL (ref 3.5–5.2)
ALK PHOS: 55 U/L (ref 39–117)
ALT: 14 U/L (ref 0–53)
AST: 19 U/L (ref 0–37)
BILIRUBIN TOTAL: 0.3 mg/dL (ref 0.2–1.2)
BUN: 17 mg/dL (ref 6–23)
CO2: 30 mEq/L (ref 19–32)
Calcium: 9.2 mg/dL (ref 8.4–10.5)
Chloride: 108 mEq/L (ref 96–112)
Creatinine, Ser: 0.86 mg/dL (ref 0.40–1.50)
GFR: 95.65 mL/min (ref >60.00)
GLUCOSE: 86 mg/dL (ref 70–99)
POTASSIUM: 3.9 meq/L (ref 3.5–5.1)
SODIUM: 143 meq/L (ref 135–145)
TOTAL PROTEIN: 7.1 g/dL (ref 6.0–8.3)

## 2017-05-22 LAB — CBC WITH DIFFERENTIAL/PLATELET
Basophils Absolute: 0 10*3/uL (ref 0.0–0.1)
Basophils Relative: 0.6 % (ref 0.0–3.0)
EOS PCT: 1.7 % (ref 0.0–5.0)
Eosinophils Absolute: 0.1 10*3/uL (ref 0.0–0.7)
HCT: 43.1 % (ref 39.0–52.0)
Hemoglobin: 14 g/dL (ref 13.0–17.0)
LYMPHS ABS: 1.9 10*3/uL (ref 0.7–4.0)
Lymphocytes Relative: 31.5 % (ref 12.0–46.0)
MCHC: 32.6 g/dL (ref 30.0–36.0)
MCV: 89.7 fl (ref 78.0–100.0)
MONO ABS: 0.6 10*3/uL (ref 0.1–1.0)
Monocytes Relative: 10.6 % (ref 3.0–12.0)
NEUTROS ABS: 3.3 10*3/uL (ref 1.4–7.7)
NEUTROS PCT: 55.6 % (ref 43.0–77.0)
PLATELETS: 295 10*3/uL (ref 150.0–400.0)
RBC: 4.8 Mil/uL (ref 4.22–5.81)
RDW: 17 % — AB (ref 11.5–15.5)
WBC: 6 10*3/uL (ref 4.0–10.5)

## 2017-05-22 NOTE — Progress Notes (Signed)
Chief Complaint  Patient presents with  . Follow-up    needs new referral for GI, missed appointment   F/u with wife c/o abdominal pain and at one pt had red urine since last visit. 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 02/2016 GI visit rec PET/CT which he never had. HE would like another GI referral as missed referral since last visit.     Review of Systems  Constitutional: Negative for fever and weight loss.  HENT: Negative for hearing loss.   Eyes:       Vision changes cant afford to see eye MD  Respiratory: Negative for shortness of breath.   Cardiovascular: Negative for chest pain.       +atrial flutter per pt when they interrogated device   Gastrointestinal: Positive for abdominal pain, blood in stool and heartburn. Negative for nausea and vomiting.       +early satiety   Musculoskeletal: Positive for back pain.  Skin: Negative for rash.  Neurological: Negative for headaches.  Psychiatric/Behavioral: Negative for memory loss.   Past Medical History:  Diagnosis Date  . Allergy   . CAD (coronary artery disease)   . Depression   . Frequent headaches   . GERD (gastroesophageal reflux disease)   . Headache   . Hyperlipidemia   . Hypertension   . PAD (peripheral artery disease) (HCC)   . Tobacco abuse    Past Surgical History:  Procedure Laterality Date  . PERCUTANEOUS CORONARY STENT INTERVENTION (PCI-S)    . STOMACH SURGERY     2009 [facility] per chart review pt had AAA repair   . TONSILLECTOMY     Family History  Problem Relation Age of Onset  . Diabetes Mother   . Heart disease Mother   . Hypertension Mother   . Stroke Mother   . Prostate cancer Father   . Cancer Father        prostate cancer in 81s  . Diabetes Sister   . Heart disease Sister   . Ulcers Other        unknown who had ulcers in family    Social History   Socioeconomic  History  . Marital status: Married    Spouse name: Not on file  . Number of children: Not on file  . Years of education: Not on file  . Highest education level: Not on file  Social Needs  . Financial resource strain: Not on file  . Food insecurity - worry: Not on file  . Food insecurity - inability: Not on file  . Transportation needs - medical: Not on file  . Transportation needs - non-medical: Not on file  Occupational History  . Not on file  Tobacco Use  . Smoking status: Current Every Day Smoker    Packs/day: 1.30    Types: Cigarettes  . Smokeless tobacco: Never Used  Substance and Sexual Activity  . Alcohol use: No    Alcohol/week: 0.0 oz    Comment: quit 1 year ago  . Drug use: No  . Sexual activity: Not Currently  Other Topics Concern  . Not on file  Social History Narrative   Disabled    4 kids 2 living    Used to do mill work    Smoker since age 66 max 2 pk per week now 1 ppd as of 02/2017    Current Meds  Medication Sig  . aspirin 81 MG tablet Take 81 mg by mouth daily.  Marland Kitchen. atorvastatin (LIPITOR) 80 MG tablet Take 1 tablet (80 mg total) by mouth daily at 6 PM.  . baclofen (LIORESAL) 10 MG tablet Take 0.5 tablets (5 mg total) by mouth 2 (two) times daily.  . Cholecalciferol (VITAMIN D3) 1000 units CAPS Take by mouth.  . citalopram (CELEXA) 10 MG tablet Take 1 tablet (10 mg total) by mouth daily.  . clopidogrel (PLAVIX) 75 MG tablet TAKE ONE (1) TABLET EACH DAY  . cyanocobalamin 100 MCG tablet Take 100 mcg by mouth daily.  . folic acid (FOLVITE) 1 MG tablet Take 1 mg by mouth daily.  Marland Kitchen. gabapentin (NEURONTIN) 300 MG capsule   . ipratropium (ATROVENT) 0.02 % nebulizer solution Inhale into the lungs.  . isosorbide mononitrate (IMDUR) 30 MG 24 hr tablet Take 30 mg 2 (two) times daily by mouth.   Marland Kitchen. lisinopril (PRINIVIL,ZESTRIL) 5 MG tablet TAKE ONE (1) TABLET EACH DAY  . Melatonin 3 MG TABS Take 3 mg by mouth.  . metoprolol succinate (TOPROL-XL) 25 MG 24 hr tablet    . mirtazapine (REMERON) 30 MG tablet   . nitroGLYCERIN (NITROSTAT) 0.4 MG SL tablet Place 1 tablet (0.4 mg total) under the tongue every 5 (five) minutes as needed for chest pain.  Marland Kitchen. oxyCODONE-acetaminophen (PERCOCET) 10-325 MG tablet   . pantoprazole (PROTONIX) 40 MG tablet Take 1 tablet (40 mg total) by mouth daily.  Marland Kitchen. pyridoxine (B-6) 100 MG tablet Take 1 tablet (100 mg total) by mouth daily.  Marland Kitchen. ZOHYDRO ER 15 MG C12A   . zolpidem (AMBIEN) 10 MG tablet Take 1 tablet (10 mg total) by mouth at bedtime as needed for sleep.   No Known Allergies Recent Results (from the past 2160 hour(s))  CBC with Differential/Platelet     Status: None   Collection Time: 02/23/17  9:54 AM  Result Value Ref Range   WBC 5.6 4.0 - 10.5 K/uL   RBC 4.90 4.22 - 5.81 Mil/uL   Hemoglobin 14.3 13.0 - 17.0 g/dL   HCT 16.144.6 09.639.0 - 04.552.0 %   MCV 91.1 78.0 - 100.0 fl   MCHC 32.1 30.0 - 36.0 g/dL   RDW 40.914.4 81.111.5 - 91.415.5 %   Platelets 260.0 150.0 - 400.0 K/uL   Neutrophils Relative % 61.9 43.0 - 77.0 %   Lymphocytes Relative 27.0 12.0 - 46.0 %   Monocytes Relative 9.0 3.0 - 12.0 %   Eosinophils Relative 1.5 0.0 - 5.0 %   Basophils Relative 0.6 0.0 - 3.0 %   Neutro Abs 3.5 1.4 - 7.7 K/uL   Lymphs Abs 1.5 0.7 - 4.0 K/uL   Monocytes Absolute 0.5 0.1 - 1.0 K/uL   Eosinophils Absolute 0.1 0.0 - 0.7 K/uL   Basophils Absolute 0.0 0.0 - 0.1 K/uL  Comprehensive metabolic panel     Status: None   Collection Time: 02/23/17  9:54 AM  Result Value Ref Range   Sodium 142 135 - 145 mEq/L   Potassium 4.4 3.5 - 5.1 mEq/L   Chloride 106 96 - 112 mEq/L   CO2 27 19 - 32 mEq/L   Glucose, Bld 81 70 - 99 mg/dL   BUN 19 6 - 23 mg/dL   Creatinine, Ser 7.820.86 0.40 - 1.50 mg/dL   Total Bilirubin 0.3 0.2 - 1.2 mg/dL   Alkaline Phosphatase 57 39 - 117 U/L   AST 21 0 - 37 U/L   ALT 13 0 -  53 U/L   Total Protein 6.9 6.0 - 8.3 g/dL   Albumin 4.3 3.5 - 5.2 g/dL   Calcium 9.3 8.4 - 81.1 mg/dL   GFR 91.47 >82.95 mL/min  PSA     Status:  None   Collection Time: 02/23/17  9:54 AM  Result Value Ref Range   PSA 2.14 0.10 - 4.00 ng/mL    Comment: Test performed using Access Hybritech PSA Assay, a parmagnetic partical, chemiluminecent immunoassay.  TSH     Status: None   Collection Time: 02/23/17  9:54 AM  Result Value Ref Range   TSH 1.32 0.35 - 4.50 uIU/mL  T4, free     Status: None   Collection Time: 02/23/17  9:54 AM  Result Value Ref Range   Free T4 0.84 0.60 - 1.60 ng/dL    Comment: Specimens from patients who are undergoing biotin therapy and /or ingesting biotin supplements may contain high levels of biotin.  The higher biotin concentration in these specimens interferes with this Free T4 assay.  Specimens that contain high levels  of biotin may cause false high results for this Free T4 assay.  Please interpret results in light of the total clinical presentation of the patient.    Hepatitis B surface antigen     Status: None   Collection Time: 02/23/17  9:54 AM  Result Value Ref Range   Hepatitis B Surface Ag NON-REACTIVE NON-REACTI  Hepatitis C antibody     Status: None   Collection Time: 02/23/17  9:54 AM  Result Value Ref Range   Hepatitis C Ab NON-REACTIVE NON-REACTI   SIGNAL TO CUT-OFF 0.04 <1.00  Lipid panel     Status: Abnormal   Collection Time: 02/23/17  9:54 AM  Result Value Ref Range   Cholesterol 108 0 - 200 mg/dL    Comment: ATP III Classification       Desirable:  < 200 mg/dL               Borderline High:  200 - 239 mg/dL          High:  > = 621 mg/dL   Triglycerides 30.8 0.0 - 149.0 mg/dL    Comment: Normal:  <657 mg/dLBorderline High:  150 - 199 mg/dL   HDL 84.69 (L) >62.95 mg/dL   VLDL 28.4 0.0 - 13.2 mg/dL   LDL Cholesterol 57 0 - 99 mg/dL   Total CHOL/HDL Ratio 3     Comment:                Men          Women1/2 Average Risk     3.4          3.3Average Risk          5.0          4.42X Average Risk          9.6          7.13X Average Risk          15.0          11.0                        NonHDL 73.45     Comment: NOTE:  Non-HDL goal should be 30 mg/dL higher than patient's LDL goal (i.e. LDL goal of < 70 mg/dL, would have non-HDL goal of < 100 mg/dL)  Hepatitis B surface antibody     Status: Abnormal  Collection Time: 02/23/17  9:58 AM  Result Value Ref Range   Hepatitis B-Post <5 (L) > OR = 10 mIU/mL    Comment: . Patient does not have immunity to hepatitis B virus. . For additional information, please refer to http://education.questdiagnostics.com/faq/FAQ105 (This link is being provided for informational/ educational purposes only).   Hepatitis B core antibody, total     Status: None   Collection Time: 02/23/17  9:58 AM  Result Value Ref Range   Hep B Core Total Ab NON-REACTIVE NON-REACTI  POCT urinalysis dipstick     Status: Abnormal   Collection Time: 02/23/17 10:11 AM  Result Value Ref Range   Color, UA yellow    Clarity, UA clear    Glucose, UA negative    Bilirubin, UA negative    Ketones, UA negative    Spec Grav, UA 1.015 1.010 - 1.025   Blood, UA large    pH, UA 6.0 5.0 - 8.0   Protein, UA negative    Urobilinogen, UA 0.2 0.2 or 1.0 E.U./dL   Nitrite, UA negative    Leukocytes, UA Negative Negative   Objective  Body mass index is 19.69 kg/m. Wt Readings from Last 3 Encounters:  05/22/17 137 lb 3.2 oz (62.2 kg)  02/23/17 133 lb 2 oz (60.4 kg)  11/02/16 131 lb 6.4 oz (59.6 kg)   Temp Readings from Last 3 Encounters:  05/22/17 98.3 F (36.8 C) (Oral)  02/23/17 97.8 F (36.6 C) (Oral)  11/02/16 98.3 F (36.8 C) (Oral)   BP Readings from Last 3 Encounters:  02/23/17 122/84  11/02/16 122/70  10/30/16 115/83   Pulse Readings from Last 3 Encounters:  02/23/17 74  11/02/16 78  10/30/16 68   O2 sat room air 95%  Physical Exam  Constitutional: He is oriented to person, place, and time and well-developed, well-nourished, and in no distress.  HENT:  Head: Normocephalic and atraumatic.  Eyes: Conjunctivae are normal. Pupils are equal,  round, and reactive to light.  Cardiovascular: Normal rate, regular rhythm and normal heart sounds.  Pulmonary/Chest: Effort normal and breath sounds normal.  Abdominal: Soft. Bowel sounds are normal. There is tenderness.  TTP mild left upper quadrant and RLQ  Neurological: He is alert and oriented to person, place, and time. Gait normal.  Skin: Skin is warm and dry.  Psychiatric: Mood, memory, affect and judgment normal.  Nursing note and vitals reviewed.   Assessment   1. 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 Refer to Dr. Servando Snare consider EGD/colonoscopy  Refer [facility] PET/CT chest ab/pelvis with contrast  CMET, CBC, UA today  2.   Of note given 2/3 hep B vaccine today  Will disc Tdap and shingrix in future  See HM last visit  Will need to see eye MD in future  Will need to do DRE in future   Provider: Dr. French Ana McLean-Scocuzza-Internal Medicine

## 2017-05-22 NOTE — Patient Instructions (Signed)
F/u in 6 weeks sooner if needed   Abdominal Pain, Adult Abdominal pain can be caused by many things. Often, abdominal pain is not serious and it gets better with no treatment or by being treated at home. However, sometimes abdominal pain is serious. Your health care provider will do a medical history and a physical exam to try to determine the cause of your abdominal pain. Follow these instructions at home:  Take over-the-counter and prescription medicines only as told by your health care provider. Do not take a laxative unless told by your health care provider.  Drink enough fluid to keep your urine clear or pale yellow.  Watch your condition for any changes.  Keep all follow-up visits as told by your health care provider. This is important. Contact a health care provider if:  Your abdominal pain changes or gets worse.  You are not hungry or you lose weight without trying.  You are constipated or have diarrhea for more than 2-3 days.  You have pain when you urinate or have a bowel movement.  Your abdominal pain wakes you up at night.  Your pain gets worse with meals, after eating, or with certain foods.  You are throwing up and cannot keep anything down.  You have a fever. Get help right away if:  Your pain does not go away as soon as your health care provider told you to expect.  You cannot stop throwing up.  Your pain is only in areas of the abdomen, such as the right side or the left lower portion of the abdomen.  You have bloody or black stools, or stools that look like tar.  You have severe pain, cramping, or bloating in your abdomen.  You have signs of dehydration, such as: ? Dark urine, very little urine, or no urine. ? Cracked lips. ? Dry mouth. ? Sunken eyes. ? Sleepiness. ? Weakness. This information is not intended to replace advice given to you by your health care provider. Make sure you discuss any questions you have with your health care  provider. Document Released: 01/04/2005 Document Revised: 10/15/2015 Document Reviewed: 09/08/2015 Elsevier Interactive Patient Education  [employer]2018 Else[employer] Inc[employer]

## 2017-05-22 NOTE — Progress Notes (Signed)
Pre visit review using our clinic review tool, if applicable. No additional management support is needed unless otherwise documented below in the visit note. 

## 2017-05-28 ENCOUNTER — Other Ambulatory Visit: Payer: Self-pay | Admitting: Internal Medicine

## 2017-05-28 ENCOUNTER — Telehealth: Payer: Self-pay | Admitting: Internal Medicine

## 2017-05-28 MED ORDER — ZOLPIDEM TARTRATE 10 MG PO TABS: 10.0000 mg | ORAL_TABLET | Freq: Every evening | ORAL | 2 refills | Status: DC | PRN

## 2017-05-28 NOTE — Telephone Encounter (Signed)
Copied from CRM [id number]. Topic: General - Other >> May 28, 2017  8:59 AM Madaleine Simmon L, NT wrote: Reason for CRM: [facility] called for  a prior authorization for the patient , patient is getting a pet scan and  ct abdomin pelvis with contrast and also a  ct chest  w contrast please call [phone] he has [employer] medicare his appointment is tomorrow at 8:00 am    

## 2017-05-28 NOTE — Telephone Encounter (Signed)
Copied from CRM [phone]. Topic: General - Other >> May 28, 2017  8:59 AM Stephannie LiSimmons, Teea Ducey L, NT wrote: Reason for CRM: [facility] service called for  a prior authorization for the patient , patient is getting a pet scan and  ct abdomin pelvis with contrast and also a  ct chest  w contrast please call [phone] he has [employer] medicare his appointment is tomorrow at 8:00 am

## 2017-05-28 NOTE — Telephone Encounter (Signed)
Please advise 

## 2017-05-29 ENCOUNTER — Ambulatory Visit: Payer: Medicare HMO

## 2017-05-29 ENCOUNTER — Ambulatory Visit: Admission: RE | Admit: 2017-05-29 | Payer: Medicare HMO | Source: Ambulatory Visit

## 2017-05-29 ENCOUNTER — Encounter: Admission: RE | Admit: 2017-05-29 | Payer: Medicare HMO | Source: Ambulatory Visit

## 2017-06-08 ENCOUNTER — Ambulatory Visit: Payer: Medicare HMO

## 2017-06-11 DIAGNOSIS — M79609 Pain in unspecified limb: Secondary | ICD-10-CM | POA: Diagnosis not present

## 2017-06-11 DIAGNOSIS — Z79899 Other long term (current) drug therapy: Secondary | ICD-10-CM | POA: Diagnosis not present

## 2017-06-11 DIAGNOSIS — Z79891 Long term (current) use of opiate analgesic: Secondary | ICD-10-CM | POA: Diagnosis not present

## 2017-06-11 DIAGNOSIS — M47816 Spondylosis without myelopathy or radiculopathy, lumbar region: Secondary | ICD-10-CM | POA: Diagnosis not present

## 2017-06-11 DIAGNOSIS — G894 Chronic pain syndrome: Secondary | ICD-10-CM | POA: Diagnosis not present

## 2017-06-14 ENCOUNTER — Encounter: Payer: Self-pay | Admitting: Gastroenterology

## 2017-06-14 ENCOUNTER — Ambulatory Visit: Payer: Medicare HMO | Admitting: Gastroenterology

## 2017-06-14 ENCOUNTER — Other Ambulatory Visit
Admission: RE | Admit: 2017-06-14 | Discharge: 2017-06-14 | Disposition: A | Discharge status: Home / Self Care | Payer: Medicare HMO | Source: Ambulatory Visit | Attending: Gastroenterology | Admitting: Gastroenterology

## 2017-06-14 VITALS — BP 129/79 | HR 69 | Ht 70.0 in | Wt 137.4 lb

## 2017-06-14 DIAGNOSIS — Z114 Encounter for screening for human immunodeficiency virus [HIV]: Secondary | ICD-10-CM | POA: Diagnosis present

## 2017-06-14 DIAGNOSIS — K581 Irritable bowel syndrome with constipation: Secondary | ICD-10-CM

## 2017-06-14 DIAGNOSIS — R69 Illness, unspecified: Secondary | ICD-10-CM | POA: Diagnosis not present

## 2017-06-14 DIAGNOSIS — R634 Abnormal weight loss: Secondary | ICD-10-CM | POA: Insufficient documentation

## 2017-06-14 DIAGNOSIS — R109 Unspecified abdominal pain: Secondary | ICD-10-CM

## 2017-06-14 LAB — RAPID HIV SCREEN (HIV 1/2 AB+AG)
HIV 1/2 Antibodies: NONREACTIVE
HIV-1 P24 Antigen - HIV24: NONREACTIVE

## 2017-06-14 NOTE — Addendum Note (Signed)
Addended by: Ardyth ManARTER, Kyonna Frier Z on: 06/14/2017 04:09 PM   Modules accepted: Orders, SmartSet

## 2017-06-14 NOTE — Progress Notes (Signed)
Wyline Mood MD, MRCP(U.K) [address]  [address]  [place], [place] [address]  Main: [phone]  Fax: [phone]   Gastroenterology Consultation  Referring Provider:     McLean-Scocuzza, French Ana * Primary Care Physician:  McLean-Scocuzza, Pasty Spillers, MD Primary Gastroenterologist:  Dr. Wyline Mood  Reason for Consultation:     Abdominal pain and weight loss         HPI:   Martin Hernandez is a 62 y.o. y/o male referred for consultation & management  by Dr. Judie Grieve, Pasty Spillers, MD.    He has been referred for abdominal pain and weight loss. ? small bowel lymphoma in 02/2016 (unclear- patient does not recall having had any chemotherapy. ). Labs 05/2017- Hb 14 ,CMP-normal   Says he has lost a few lbs recently .  BP 129/79   Pulse 69   Ht 5\' 10"  (1.778 m)   Wt 137 lb 6.4 oz (62.3 kg)   BMI 19.71 kg/m    Since 02/2017 gained 4 lbs .    Abdominal pain: Onset: Began 3 months back , on and off , all day long ,  Site :lower abdomen  Radiation: localized  Severity :very severe  Nature of pain: sharp in nature  Aggravating factors: nothing  Relieving factors :laying on his side  Weight loss: yes  NSAID use: no  PPI use :none  Gall bladder surgery: no  Frequency of bowel movements: once a day - hard some days and soft some days , some days hard to get out. On days it is hard to get out has more pain..  Change in bowel movements: no  Relief with bowel movements: yes  Gas/Bloating/Abdominal distension: full of gas, drinks a lot of gas .   He has some night sweats.      Past Medical History:  Diagnosis Date  . Allergy   . CAD (coronary artery disease)   . Depression   . Frequent headaches   . GERD (gastroesophageal reflux disease)   . Headache   . Hyperlipidemia   . Hypertension   . PAD (peripheral artery disease) (HCC)   . Tobacco abuse     Past Surgical History:  Procedure Laterality Date  . PERCUTANEOUS CORONARY STENT INTERVENTION (PCI-S)    .  STOMACH SURGERY     2009 [facility] per chart review pt had AAA repair   . TONSILLECTOMY      Prior to Admission medications   Medication Sig Start Date End Date Taking? Authorizing Provider  aspirin 81 MG tablet Take 81 mg by mouth daily.    [provider]  atorvastatin (LIPITOR) 80 MG tablet Take 1 tablet (80 mg total) by mouth daily at 6 PM. 09/14/16   Tommie Sams, DO  baclofen (LIORESAL) 10 MG tablet Take 0.5 tablets (5 mg total) by mouth 2 (two) times daily. 02/28/17   McLean-Scocuzza, Pasty Spillers, MD  Cholecalciferol (VITAMIN D3) 1000 units CAPS Take by mouth.    [provider]  citalopram (CELEXA) 10 MG tablet Take 1 tablet (10 mg total) by mouth daily. 08/08/16   Tommie Sams, DO  clopidogrel (PLAVIX) 75 MG tablet TAKE ONE (1) TABLET EACH DAY 09/14/16   Everlene Other G, DO  cyanocobalamin 100 MCG tablet Take 100 mcg by mouth daily.    [provider]  folic acid (FOLVITE) 1 MG tablet Take 1 mg by mouth daily.    [provider]  gabapentin (NEURONTIN) 300 MG capsule  03/12/17  [provider]  ipratropium (ATROVENT) 0.02 % nebulizer solution Inhale into the lungs. 12/07/14   [provider]  isosorbide mononitrate (IMDUR) 30 MG 24 hr tablet Take 30 mg 2 (two) times daily by mouth.     [provider]  lisinopril (PRINIVIL,ZESTRIL) 5 MG tablet TAKE ONE (1) TABLET EACH DAY 09/14/16   Tommie Samsook, Jayce G, DO  Melatonin 3 MG TABS Take 3 mg by mouth.    [provider]  metoprolol succinate (TOPROL-XL) 25 MG 24 hr tablet  02/22/17   [provider]  mirtazapine (REMERON) 30 MG tablet  02/22/17   [provider]  nitroGLYCERIN (NITROSTAT) 0.4 MG SL tablet Place 1 tablet (0.4 mg total) under the tongue every 5 (five) minutes as needed for chest pain. 10/19/16   Tommie Samsook, Jayce G, DO  oxyCODONE-acetaminophen (PERCOCET) 10-325 MG tablet  05/18/16   [provider]  pantoprazole (PROTONIX) 40 MG tablet Take 1 tablet (40 mg  total) by mouth daily. 09/14/16   Tommie Samsook, Jayce G, DO  pyridoxine (B-6) 100 MG tablet Take 1 tablet (100 mg total) by mouth daily. 09/14/16   Tommie Samsook, Jayce G, DO  pyridOXINE (VITAMIN B-6) 100 MG tablet Take by mouth.    [provider]  ZOHYDRO ER 15 MG C12A  02/13/17   [provider]  zolpidem (AMBIEN) 10 MG tablet Take 1 tablet (10 mg total) by mouth at bedtime as needed for sleep. 05/28/17   McLean-Scocuzza, Pasty Spillersracy N, MD    Family History  Problem Relation Age of Onset  . Diabetes Mother   . Heart disease Mother   . Hypertension Mother   . Stroke Mother   . Prostate cancer Father   . Cancer Father        prostate cancer in 7150s  . Diabetes Sister   . Heart disease Sister   . Ulcers Other        unknown who had ulcers in family      Social History   Tobacco Use  . Smoking status: Current Every Day Smoker    Packs/day: 1.30    Types: Cigarettes  . Smokeless tobacco: Never Used  Substance Use Topics  . Alcohol use: No    Alcohol/week: 0.0 oz    Comment: quit 1 year ago  . Drug use: No    Allergies as of 06/14/2017  . (No Known Allergies)    Review of Systems:    All systems reviewed and negative except where noted in HPI.   Physical Exam:  There were no vitals taken for this visit. No LMP for male patient. Psych:  Alert and cooperative. Normal mood and affect. General:   Alert,  Well-developed, well-nourished, pleasant and cooperative in NAD Head:  Normocephalic and atraumatic. Eyes:  Sclera clear, no icterus.   Conjunctiva pink. Ears:  Normal auditory acuity. Nose:  No deformity, discharge, or lesions. Mouth:  No deformity or lesions,oropharynx pink & moist. Neck:  Supple; no masses or thyromegaly. Lungs:  Respirations even and unlabored.  Clear throughout to auscultation.   No wheezes, crackles, or rhonchi. No acute distress. Heart:  Regular rate and rhythm; no murmurs, clicks, rubs, or gallops. Abdomen:  Normal bowel sounds.  No bruits.  Soft,  non-tender and non-distended without masses, hepatosplenomegaly or hernias noted.  No guarding or rebound tenderness.    Neurologic:  Alert and oriented x3;  grossly normal neurologically. Skin:  Intact without significant lesions or rashes. No jaundice. Lymph Nodes:  No significant cervical  adenopathy. Psych:  Alert and cooperative. Normal mood and affect.  Imaging Studies: No results found.  Assessment and Plan:   Martin Hernandez is a 62 y.o. y/o male has been referred for abdominal pain , weight loss.. Abdominal pain better after a bowel movement . 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 Since 02/2017 when I look back on Epic his weight has been stable.    Plan  1. He has had a PET CT scan of the body ordered by McLean-Scocuzza, Pasty Spillers, MD- await results 2. Stool Hpylori antigen  3. EGD+colonoscopy , if negative can also consider a capsule study based on PET scan due to prior concerns of small bowel lymphoma  4/. HIV testing  5. Plavix holding instructions, he also has a pacemaker.  6. Linzess 145 mcg - 2 weeks samples provided     I have discussed alternative options, risks & benefits,  which include, but are not limited to, bleeding, infection, perforation,respiratory complication & drug reaction.  The patient agrees with this plan & written consent will be obtained.     Follow up in 8 weeks  Dr Wyline Mood MD,MRCP(U.K)

## 2017-06-16 ENCOUNTER — Inpatient Hospital Stay
Admission: RE | Admit: 2017-06-16 | Discharge: 2017-06-16 | Disposition: A | Discharge status: Home / Self Care | Payer: Self-pay | Source: Ambulatory Visit | Attending: *Deleted | Admitting: *Deleted

## 2017-06-16 DIAGNOSIS — R69 Illness, unspecified: Secondary | ICD-10-CM | POA: Diagnosis not present

## 2017-06-16 DIAGNOSIS — R634 Abnormal weight loss: Secondary | ICD-10-CM | POA: Diagnosis not present

## 2017-06-16 DIAGNOSIS — Z114 Encounter for screening for human immunodeficiency virus [HIV]: Secondary | ICD-10-CM | POA: Diagnosis not present

## 2017-06-18 ENCOUNTER — Telehealth: Payer: Self-pay

## 2017-06-18 ENCOUNTER — Encounter: Payer: Self-pay | Admitting: Gastroenterology

## 2017-06-18 LAB — H. PYLORI ANTIGEN, STOOL: H. Pylori Stool Ag, Eia: NEGATIVE

## 2017-06-18 NOTE — Telephone Encounter (Signed)
Advised patient that documentation was received from Dr. Maurine CaneStiber giving clearance for Plavix. Added to his chart.

## 2017-06-20 ENCOUNTER — Other Ambulatory Visit: Payer: Self-pay

## 2017-06-20 ENCOUNTER — Ambulatory Visit: Payer: Medicare HMO | Admitting: Anesthesiology

## 2017-06-20 ENCOUNTER — Ambulatory Visit
Admission: RE | Admit: 2017-06-20 | Discharge: 2017-06-20 | Disposition: A | Discharge status: Home / Self Care | Payer: Medicare HMO | Source: Ambulatory Visit | Attending: Gastroenterology | Admitting: Gastroenterology

## 2017-06-20 ENCOUNTER — Encounter
Admission: RE | Disposition: A | Discharge status: Home / Self Care | Payer: Self-pay | Source: Ambulatory Visit | Attending: Gastroenterology

## 2017-06-20 DIAGNOSIS — F17210 Nicotine dependence, cigarettes, uncomplicated: Secondary | ICD-10-CM | POA: Insufficient documentation

## 2017-06-20 DIAGNOSIS — Z955 Presence of coronary angioplasty implant and graft: Secondary | ICD-10-CM | POA: Diagnosis not present

## 2017-06-20 DIAGNOSIS — I2510 Atherosclerotic heart disease of native coronary artery without angina pectoris: Secondary | ICD-10-CM | POA: Diagnosis not present

## 2017-06-20 DIAGNOSIS — R109 Unspecified abdominal pain: Secondary | ICD-10-CM | POA: Insufficient documentation

## 2017-06-20 DIAGNOSIS — K2950 Unspecified chronic gastritis without bleeding: Secondary | ICD-10-CM | POA: Insufficient documentation

## 2017-06-20 DIAGNOSIS — I110 Hypertensive heart disease with heart failure: Secondary | ICD-10-CM | POA: Diagnosis not present

## 2017-06-20 DIAGNOSIS — R634 Abnormal weight loss: Secondary | ICD-10-CM | POA: Insufficient documentation

## 2017-06-20 DIAGNOSIS — Z8679 Personal history of other diseases of the circulatory system: Secondary | ICD-10-CM | POA: Diagnosis not present

## 2017-06-20 DIAGNOSIS — E785 Hyperlipidemia, unspecified: Secondary | ICD-10-CM | POA: Diagnosis not present

## 2017-06-20 DIAGNOSIS — K219 Gastro-esophageal reflux disease without esophagitis: Secondary | ICD-10-CM | POA: Diagnosis not present

## 2017-06-20 DIAGNOSIS — I10 Essential (primary) hypertension: Secondary | ICD-10-CM | POA: Diagnosis not present

## 2017-06-20 DIAGNOSIS — Z79899 Other long term (current) drug therapy: Secondary | ICD-10-CM | POA: Insufficient documentation

## 2017-06-20 DIAGNOSIS — K2960 Other gastritis without bleeding: Secondary | ICD-10-CM | POA: Diagnosis not present

## 2017-06-20 DIAGNOSIS — K3189 Other diseases of stomach and duodenum: Secondary | ICD-10-CM | POA: Insufficient documentation

## 2017-06-20 DIAGNOSIS — K2990 Gastroduodenitis, unspecified, without bleeding: Secondary | ICD-10-CM | POA: Diagnosis not present

## 2017-06-20 DIAGNOSIS — F329 Major depressive disorder, single episode, unspecified: Secondary | ICD-10-CM | POA: Diagnosis not present

## 2017-06-20 DIAGNOSIS — K581 Irritable bowel syndrome with constipation: Secondary | ICD-10-CM

## 2017-06-20 DIAGNOSIS — R69 Illness, unspecified: Secondary | ICD-10-CM | POA: Diagnosis not present

## 2017-06-20 DIAGNOSIS — K5790 Diverticulosis of intestine, part unspecified, without perforation or abscess without bleeding: Secondary | ICD-10-CM | POA: Diagnosis not present

## 2017-06-20 DIAGNOSIS — K2970 Gastritis, unspecified, without bleeding: Secondary | ICD-10-CM

## 2017-06-20 DIAGNOSIS — I739 Peripheral vascular disease, unspecified: Secondary | ICD-10-CM | POA: Insufficient documentation

## 2017-06-20 DIAGNOSIS — Z7982 Long term (current) use of aspirin: Secondary | ICD-10-CM | POA: Insufficient documentation

## 2017-06-20 DIAGNOSIS — I509 Heart failure, unspecified: Secondary | ICD-10-CM | POA: Diagnosis not present

## 2017-06-20 HISTORY — PX: ESOPHAGOGASTRODUODENOSCOPY (EGD) WITH PROPOFOL: SHX5813

## 2017-06-20 HISTORY — PX: COLONOSCOPY WITH PROPOFOL: SHX5780

## 2017-06-20 SURGERY — ESOPHAGOGASTRODUODENOSCOPY (EGD) WITH PROPOFOL
Anesthesia: General

## 2017-06-20 MED ORDER — PROPOFOL 500 MG/50ML IV EMUL
INTRAVENOUS | Status: DC | PRN
  Administered 2017-06-20: 100 ug/kg/min via INTRAVENOUS

## 2017-06-20 MED ORDER — PROPOFOL 10 MG/ML IV BOLUS
INTRAVENOUS | Status: DC | PRN
  Administered 2017-06-20: 50 mg via INTRAVENOUS
  Administered 2017-06-20: 100 mg via INTRAVENOUS
  Administered 2017-06-20 (×2): 50 mg via INTRAVENOUS
  Administered 2017-06-20: 100 mg via INTRAVENOUS

## 2017-06-20 MED ORDER — SODIUM CHLORIDE 0.9 % IV SOLN: INTRAVENOUS | Status: DC

## 2017-06-20 MED ORDER — PROPOFOL 500 MG/50ML IV EMUL
INTRAVENOUS | Status: AC
  Filled 2017-06-20: qty 50

## 2017-06-20 MED ORDER — IPRATROPIUM-ALBUTEROL 0.5-2.5 (3) MG/3ML IN SOLN: 3.0000 mL | Freq: Once | RESPIRATORY_TRACT | Status: DC

## 2017-06-20 MED ORDER — SODIUM CHLORIDE 0.9 % IV SOLN
INTRAVENOUS | Status: DC | PRN
  Administered 2017-06-20: 14:00:00 via INTRAVENOUS

## 2017-06-20 MED ORDER — PHENYLEPHRINE HCL 10 MG/ML IJ SOLN
INTRAMUSCULAR | Status: AC
  Filled 2017-06-20: qty 1

## 2017-06-20 MED ORDER — PHENYLEPHRINE HCL 10 MG/ML IJ SOLN
INTRAMUSCULAR | Status: DC | PRN
  Administered 2017-06-20: 100 ug via INTRAVENOUS
  Administered 2017-06-20: 200 ug via INTRAVENOUS

## 2017-06-20 MED ORDER — LIDOCAINE HCL (CARDIAC) 20 MG/ML IV SOLN
INTRAVENOUS | Status: DC | PRN
  Administered 2017-06-20: 50 mg via INTRAVENOUS

## 2017-06-20 MED ORDER — LIDOCAINE HCL (PF) 2 % IJ SOLN
INTRAMUSCULAR | Status: AC
  Filled 2017-06-20: qty 10

## 2017-06-20 MED ORDER — PROPOFOL 10 MG/ML IV BOLUS
INTRAVENOUS | Status: AC
  Filled 2017-06-20: qty 20

## 2017-06-20 NOTE — Transfer of Care (Signed)
Immediate Anesthesia Transfer of Care Note  Patient: Martin Hernandez  Procedure(s) Performed: ESOPHAGOGASTRODUODENOSCOPY (EGD) WITH PROPOFOL (N/A ) COLONOSCOPY WITH PROPOFOL (N/A )  Patient Location: PACU  Anesthesia Type:General  Level of Consciousness: drowsy and patient cooperative  Airway & Oxygen Therapy: Patient Spontanous Breathing and Patient connected to nasal cannula oxygen  Post-op Assessment: Report given to RN, Post -op Vital signs reviewed and stable and Patient moving all extremities X 4  Post vital signs: Reviewed and stable  Last Vitals:  Vitals:   06/20/17 1320 06/20/17 1418  BP: 116/90 (!) 87/63  Pulse: 91   Resp: 20   Temp: (!) 36.1 C 36.4 C  SpO2: 100%     Last Pain:  Vitals:   06/20/17 1418  TempSrc: Tympanic  PainSc:          Complications: No apparent anesthesia complications

## 2017-06-20 NOTE — Anesthesia Postprocedure Evaluation (Signed)
Anesthesia Post Note  Patient: Martin Hernandez  Procedure(s) Performed: ESOPHAGOGASTRODUODENOSCOPY (EGD) WITH PROPOFOL (N/A ) COLONOSCOPY WITH PROPOFOL (N/A )  Patient location during evaluation: Endoscopy Anesthesia Type: General Level of consciousness: awake and alert, oriented and patient cooperative Pain management: pain level controlled Vital Signs Assessment: post-procedure vital signs reviewed and stable Respiratory status: spontaneous breathing and respiratory function stable Cardiovascular status: blood pressure returned to baseline and stable Postop Assessment: no headache, no backache, patient able to bend at knees, no apparent nausea or vomiting and adequate PO intake Anesthetic complications: no     Last Vitals:  Vitals:   06/20/17 1320 06/20/17 1418  BP: 116/90 (!) 87/63  Pulse: 91   Resp: 20   Temp: (!) 36.1 C 36.4 C  SpO2: 100%     Last Pain:  Vitals:   06/20/17 1418  TempSrc: Tympanic  PainSc:                  Martin Hernandez

## 2017-06-20 NOTE — H&P (Signed)
Martin MoodKiran Grisela Mesch, MD [address], [address], [place], [place], [address] [address], [address], [place], [place], [address] Phone: [phone]  Fax: [phone]  Primary Care Physician:  McLean-Scocuzza, Pasty Spillersracy N, MD   Pre-Procedure History & Physical: HPI:  Geralyn FlashLindsay F Zaun is a [age] y.o. male is here for an endoscopy and colonoscopy    Past Medical History:  Diagnosis Date  . Allergy   . CAD (coronary artery disease)   . Depression   . Frequent headaches   . GERD (gastroesophageal reflux disease)   . Headache   . Hyperlipidemia   . Hypertension   . PAD (peripheral artery disease) (HCC)   . Tobacco abuse     Past Surgical History:  Procedure Laterality Date  . PERCUTANEOUS CORONARY STENT INTERVENTION (PCI-S)    . STOMACH SURGERY     [facility] pt had [facility] repair   . TONSILLECTOMY      Prior to Admission medications   Medication Sig Start Date End Date Taking? Authorizing Provider  atorvastatin (LIPITOR) 80 MG tablet Take 1 tablet (80 mg total) by mouth daily at 6 PM. 09/14/16  Yes Cook, Jayce G, DO  cyanocobalamin 100 MCG tablet Take 100 mcg by mouth daily.   Yes [provider]  folic acid (FOLVITE) 1 MG tablet Take 1 mg by mouth daily.   Yes [provider]  gabapentin (NEURONTIN) 300 MG capsule  03/12/17  Yes [provider]  ipratropium (ATROVENT) 0.02 % nebulizer solution Inhale into the lungs. 12/07/14  Yes [provider]  isosorbide mononitrate (IMDUR) 30 MG 24 hr tablet Take 30 mg 2 (two) times daily by mouth.    Yes [provider]  lisinopril (PRINIVIL,ZESTRIL) 5 MG tablet TAKE ONE (1) TABLET EACH DAY 09/14/16  Yes Cook, Jayce G, DO  Melatonin 3 MG TABS Take 3 mg by mouth.   Yes [provider]  metoprolol succinate (TOPROL-XL) 25 MG 24 hr tablet  02/22/17  Yes [provider]  mirtazapine (REMERON) 30 MG tablet  02/22/17  Yes [provider]  oxyCODONE-acetaminophen  (PERCOCET) 10-325 MG tablet  05/18/16  Yes [provider]  pantoprazole (PROTONIX) 40 MG tablet Take 1 tablet (40 mg total) by mouth daily. 09/14/16  Yes Cook, Jayce G, DO  pyridoxine (B-6) 100 MG tablet Take 1 tablet (100 mg total) by mouth daily. 09/14/16  Yes Tommie Samsook, Jayce G, DO  ZOHYDRO ER 15 MG C12A  02/13/17  Yes [provider]  zolpidem (AMBIEN) 10 MG tablet Take 1 tablet (10 mg total) by mouth at bedtime as needed for sleep. 05/28/17  Yes McLean-Scocuzza, Pasty Spillersracy N, MD  aspirin 81 MG tablet Take 81 mg by mouth daily.    [provider]  baclofen (LIORESAL) 10 MG tablet Take 0.5 tablets (5 mg total) by mouth 2 (two) times daily. 02/28/17   McLean-Scocuzza, Pasty Spillersracy N, MD  Cholecalciferol (VITAMIN D3) 1000 units CAPS Take by mouth.    [provider]  citalopram (CELEXA) 10 MG tablet Take 1 tablet (10 mg total) by mouth daily. Patient not taking: Reported on 06/14/2017 08/08/16   Tommie Samsook, Jayce G, DO  clopidogrel (PLAVIX) 75 MG tablet TAKE ONE (1) TABLET EACH DAY 09/14/16   Tommie Samsook, Jayce G, DO  nitroGLYCERIN (NITROSTAT) 0.4 MG SL tablet Place 1 tablet (0.4 mg total) under the tongue every 5 (five) minutes as needed for chest pain. 10/19/16   Tommie Samsook, Jayce G, DO  pyridOXINE (VITAMIN B-6)  100 MG tablet Take by mouth.    [provider]    Allergies as of 06/18/2017  . (No Known Allergies)    Family History  Problem Relation Age of Onset  . Diabetes Mother   . Heart disease Mother   . Hypertension Mother   . Stroke Mother   . Prostate cancer Father   . Cancer Father        prostate cancer in 50s  . Diabetes Sister   . Heart disease Sister   . Ulcers Other        unknown who had ulcers in family     Social History   Socioeconomic History  . Marital status: Married    Spouse name: Not on file  . Number of children: Not on file  . Years of education: Not on file  . Highest education level: Not on file  Social Needs  . Financial resource strain: Not on file   . Food insecurity - worry: Not on file  . Food insecurity - inability: Not on file  . Transportation needs - medical: Not on file  . Transportation needs - non-medical: Not on file  Occupational History  . Not on file  Tobacco Use  . Smoking status: Current Every Day Smoker    Packs/day: 0.25    Types: Cigarettes  . Smokeless tobacco: Never Used  Substance and Sexual Activity  . Alcohol use: No    Alcohol/week: 0.0 oz    Comment: quit 1 year ago  . Drug use: No  . Sexual activity: Not Currently  Other Topics Concern  . Not on file  Social History Narrative   Disabled    4 kids 2 living    Used to do mill work    Smoker since age 41 max 2 pk per week now 1 ppd as of 02/2017     Review of Systems: See HPI, otherwise negative ROS  Physical Exam: BP 116/90   Pulse 91   Temp (!) 96.9 F (36.1 C)   Resp 20   Ht 5\' 9"  (1.753 m)   SpO2 100%   BMI 20.29 kg/m  General:   Alert,  pleasant and cooperative in NAD Head:  Normocephalic and atraumatic. Neck:  Supple; no masses or thyromegaly. Lungs:  Clear throughout to auscultation, normal respiratory effort.    Heart:  +S1, +S2, Regular rate and rhythm, No edema. Abdomen:  Soft, nontender and nondistended. Normal bowel sounds, without guarding, and without rebound.   Neurologic:  Alert and  oriented x4;  grossly normal neurologically.  Impression/Plan: GRIFF BADLEY is here for an endoscopy and colonoscopy  to be performed for  evaluation of abdominal pain and weight loss    Risks, benefits, limitations, and alternatives regarding endoscopy have been reviewed with the patient.  Questions have been answered.  All parties agreeable.   Martin Mood, MD  06/20/2017, 1:40 PM

## 2017-06-20 NOTE — Op Note (Signed)
[facility] Gastroenterology Patient Name: Martin Hernandez Procedure Date: 06/20/2017 1:48 PM MRN: [id number] Account #: [id number] Date of Birth: July 10, 1955 Admit Type: Outpatient Age: 62 Room: [facility] ENDO ROOM 2 Gender: Male Note Status: Finalized Procedure:            Colonoscopy Indications:          Weight loss Providers:            Wyline Mood MD, MD Referring MD:         Pasty Spillers Mclean-Scocuzza MD, MD (Referring MD) Medicines:            Monitored Anesthesia Care Complications:        No immediate complications. Procedure:            Pre-Anesthesia Assessment:                       - Prior to the procedure, a History and Physical was                        performed, and patient medications, allergies and                        sensitivities were reviewed. The patient's tolerance of                        previous anesthesia was reviewed.                       - The risks and benefits of the procedure and the                        sedation options and risks were discussed with the                        patient. All questions were answered and informed                        consent was obtained.                       - ASA Grade Assessment: III - A patient with severe                        systemic disease.                       After obtaining informed consent, the colonoscope was                        passed under direct vision. Throughout the procedure,                        the patient's blood pressure, pulse, and oxygen                        saturations were monitored continuously. The                        Colonoscope was introduced through the anus and  advanced to the the cecum, identified by the                        appendiceal orifice, IC valve and transillumination.                        The colonoscopy was technically difficult and complex                        due to a tortuous colon. The patient tolerated the                         procedure well. The quality of the bowel preparation                        was poor. Findings:      The perianal and digital rectal examinations were normal.      Extensive amounts of semi-solid stool was found in the entire colon,       interfering with visualization.      The exam was otherwise without abnormality on direct and retroflexion       views.      A patchy area of moderately erythematous mucosa was found in the cecum.       Are showed scattered areas of adherant blood, limited visualization       likely from barotrauma Impression:           - Preparation of the colon was poor.                       - Stool in the entire examined colon.                       - The examination was otherwise normal on direct and                        retroflexion views.                       - No specimens collected. Recommendation:       - Discharge patient to home (with escort).                       - Resume previous diet.                       - Continue present medications.                       - Repeat colonoscopy in 2 weeks because the bowel                        preparation was suboptimal. Procedure Code(s):    --- Professional ---                       [phone], Colonoscopy, flexible; diagnostic, including                        collection of specimen(s) by brushing or washing, when                        performed (  separate procedure) Diagnosis Code(s):    --- Professional ---                       R63.4, Abnormal weight loss CPT copyright 2016 American Medical Association. All rights reserved. The codes documented in this report are preliminary and upon coder review may  be revised to meet current compliance requirements. Wyline MoodKiran Jaser Fullen, MD Wyline MoodKiran Kristalynn Coddington MD, MD 06/20/2017 2:19:48 PM This report has been signed electronically. Number of Addenda: 0 Note Initiated On: 06/20/2017 1:48 PM Scope Withdrawal Time: 0 hours 4 minutes 16 seconds  Total Procedure  Duration: 0 hours 13 minutes 22 seconds       [facility]

## 2017-06-20 NOTE — Anesthesia Post-op Follow-up Note (Signed)
Anesthesia QCDR form completed.        

## 2017-06-20 NOTE — Anesthesia Preprocedure Evaluation (Signed)
Anesthesia Evaluation  Patient identified by MRN, date of birth, ID band Patient awake    Reviewed: Allergy & Precautions, H&P , NPO status , Patient's Chart, lab work & pertinent test results  History of Anesthesia Complications Negative for: history of anesthetic complications  Airway Mallampati: II  TM Distance: >3 FB Neck ROM: full    Dental  (+) Chipped, Poor Dentition, Missing   Pulmonary neg shortness of breath, Current Smoker,           Cardiovascular Exercise Tolerance: Good hypertension, (-) angina+ CAD, + Peripheral Vascular Disease and +CHF  (-) Past MI + Cardiac Defibrillator      Neuro/Psych  Headaches, PSYCHIATRIC DISORDERS Anxiety Depression    GI/Hepatic negative GI ROS, Neg liver ROS, GERD  Medicated and Controlled,  Endo/Other  negative endocrine ROS  Renal/GU negative Renal ROS  negative genitourinary   Musculoskeletal   Abdominal   Peds  Hematology negative hematology ROS (+)   Anesthesia Other Findings Past Medical History: No date: Allergy No date: CAD (coronary artery disease) No date: Depression No date: Frequent headaches No date: GERD (gastroesophageal reflux disease) No date: Headache No date: Hyperlipidemia No date: Hypertension No date: PAD (peripheral artery disease) (HCC) No date: Tobacco abuse  Past Surgical History: No date: PERCUTANEOUS CORONARY STENT INTERVENTION (PCI-S) No date: STOMACH SURGERY     Comment:  2009 [facility] per chart review pt had AAA repair  No date: TONSILLECTOMY  BMI    Body Mass Index:  20.29 kg/m      Reproductive/Obstetrics negative OB ROS                             Anesthesia Physical Anesthesia Plan  ASA: III  Anesthesia Plan: General   Post-op Pain Management:    Induction: Intravenous  PONV Risk Score and Plan: Propofol infusion and TIVA  Airway Management Planned: Natural Airway and Nasal  Cannula  Additional Equipment:   Intra-op Plan:   Post-operative Plan:   Informed Consent: I have reviewed the patients History and Physical, chart, labs and discussed the procedure including the risks, benefits and alternatives for the proposed anesthesia with the patient or authorized representative who has indicated his/her understanding and acceptance.   Dental Advisory Given  Plan Discussed with: Anesthesiologist, CRNA and Surgeon  Anesthesia Plan Comments: (Patient consented for risks of anesthesia including but not limited to:  - adverse reactions to medications - risk of intubation if required - damage to teeth, lips or other oral mucosa - sore throat or hoarseness - Damage to heart, brain, lungs or loss of life  Patient voiced understanding.)        Anesthesia Quick Evaluation

## 2017-06-20 NOTE — Op Note (Signed)
[facility] Gastroenterology Patient Name: Martin LatinoLindsay Chohan Procedure Date: 06/20/2017 1:49 PM MRN: [id number] Account #: [id number] Date of Birth: [birth year] Admit Type: Outpatient Age: 62 [facility] 2 Gender: Male Note Status: Finalized Procedure:            Upper GI endoscopy Indications:          Abdominal pain Providers:            Wyline MoodKiran Guillermina Shaft MD, MD Referring MD:         Pasty Spillersracy N. Mclean-Scocuzza MD, MD (Referring MD) Medicines:            Monitored Anesthesia Care Complications:        No immediate complications. Procedure:            Pre-Anesthesia Assessment:                       - Prior to the procedure, a History and Physical was                        performed, and patient medications, allergies and                        sensitivities were reviewed. The patient's tolerance of                        previous anesthesia was reviewed.                       - The risks and benefits of the procedure and the                        sedation options and risks were discussed with the                        patient. All questions were answered and informed                        consent was obtained.                       - ASA Grade Assessment: III - A patient with severe                        systemic disease.                       After obtaining informed consent, the endoscope was                        passed under direct vision. Throughout the procedure,                        the patient's blood pressure, pulse, and oxygen                        saturations were monitored continuously. The Endoscope                        was introduced through the mouth, and advanced to the  third part of duodenum. The upper GI endoscopy was                        accomplished with ease. The patient tolerated the                        procedure well. Findings:      The esophagus was normal.      Diffuse moderate inflammation  characterized by congestion (edema) and       erythema was found in the gastric body, in the gastric antrum and in the       prepyloric region of the stomach. Biopsies were taken with a cold       forceps for histology.      Patchy moderately erythematous mucosa without active bleeding and with       no stigmata of bleeding was found in the duodenal bulb. Biopsies were       taken with a cold forceps for histology.      The exam was otherwise without abnormality.      The cardia and gastric fundus were normal on retroflexion. Impression:           - Normal esophagus.                       - Gastritis. Biopsied.                       - Erythematous duodenopathy. Biopsied.                       - The examination was otherwise normal. Recommendation:       - Await pathology results.                       - Perform a colonoscopy today. Procedure Code(s):    --- Professional ---                       [phone], Esophagogastroduodenoscopy, flexible, transoral;                        with biopsy, single or multiple Diagnosis Code(s):    --- Professional ---                       K29.70, Gastritis, unspecified, without bleeding                       K31.89, Other diseases of stomach and duodenum                       R10.9, Unspecified abdominal pain CPT copyright 2016 American Medical Association. All rights reserved. The codes documented in this report are preliminary and upon coder review may  be revised to meet current compliance requirements. Wyline Mood, MD Wyline Mood MD, MD 06/20/2017 1:58:06 PM This report has been signed electronically. Number of Addenda: 0 Note Initiated On: 06/20/2017 1:49 PM      [facility]

## 2017-06-21 ENCOUNTER — Encounter: Payer: Self-pay | Admitting: Gastroenterology

## 2017-06-22 LAB — SURGICAL PATHOLOGY

## 2017-06-24 ENCOUNTER — Encounter: Payer: Self-pay | Admitting: Gastroenterology

## 2017-06-28 ENCOUNTER — Telehealth: Payer: Self-pay

## 2017-06-28 NOTE — Telephone Encounter (Signed)
Patient stated that he received his letter informing him that he has gastritis and Duodenitis.  He stated that he is experiences lower abdominal pain that is sharp at times.  He would like to know if we could call in a rx for him.  Please advise.  Thanks [employer]

## 2017-06-29 ENCOUNTER — Other Ambulatory Visit: Payer: Self-pay

## 2017-07-01 NOTE — Telephone Encounter (Signed)
Marcelino DusterMichelle,    Send in a script for prilosec 40 mg once daily   Enquire how his constipation is doing with linzess samples and if he is having more bowel movements and if the pain is better. His abdominal pain may be from constipation. Advise to stop all NSAID's  Dr Wyline MoodKiran Jaylyn Iyer MD,MRCP [facility] G[facility]enterology/Hepatology Pager: [phone]

## 2017-07-04 ENCOUNTER — Other Ambulatory Visit: Payer: Self-pay

## 2017-07-04 MED ORDER — OMEPRAZOLE 40 MG PO CPDR: 40.0000 mg | DELAYED_RELEASE_CAPSULE | Freq: Every day | ORAL | 3 refills | Status: DC

## 2017-07-04 NOTE — Telephone Encounter (Signed)
Patient stated that his constipation did not get any better with Linzess.  He averages one  easy bowel movement a day.  Prilosec 40 mg daily will be sent to pharmacy.  Thanks [employer]

## 2017-07-05 ENCOUNTER — Other Ambulatory Visit: Payer: Self-pay

## 2017-07-05 NOTE — Telephone Encounter (Signed)
Ok lets increase to 290 mcg per day linzess- give 2 weeks samples

## 2017-07-06 NOTE — Telephone Encounter (Signed)
Completed.  Pt picked up samples yesterday.

## 2017-07-09 ENCOUNTER — Other Ambulatory Visit: Payer: Self-pay

## 2017-07-09 DIAGNOSIS — R634 Abnormal weight loss: Secondary | ICD-10-CM

## 2017-07-09 MED ORDER — PEG 3350-KCL-NA BICARB-NACL 420 G PO SOLR: 4000.0000 mL | Freq: Once | ORAL | 0 refills | Status: AC

## 2017-07-09 MED ORDER — MAGNESIUM CITRATE PO SOLN: 1.0000 | Freq: Once | ORAL | 0 refills | Status: AC

## 2017-07-09 NOTE — Progress Notes (Signed)
Advised patient that Dr. Tobi BastosAnna was requesting a repeat colonoscopy due to poor bowel preparation.   Explained 2-day prep with Magnesium Citrate and Golytely.  Offered printed instructions to the patient. He stated that he understood what I was saying and knew what to do.   Sent Rx to pharmacy.   Ordered procedure.   Setup referral for tracking / pre-auth.  Patient holding Plavix per clearance.

## 2017-07-11 DIAGNOSIS — M545 Low back pain: Secondary | ICD-10-CM | POA: Diagnosis not present

## 2017-07-11 DIAGNOSIS — Z79891 Long term (current) use of opiate analgesic: Secondary | ICD-10-CM | POA: Diagnosis not present

## 2017-07-11 DIAGNOSIS — Z79899 Other long term (current) drug therapy: Secondary | ICD-10-CM | POA: Diagnosis not present

## 2017-07-11 DIAGNOSIS — G894 Chronic pain syndrome: Secondary | ICD-10-CM | POA: Diagnosis not present

## 2017-07-11 DIAGNOSIS — M79609 Pain in unspecified limb: Secondary | ICD-10-CM | POA: Diagnosis not present

## 2017-07-11 DIAGNOSIS — M47816 Spondylosis without myelopathy or radiculopathy, lumbar region: Secondary | ICD-10-CM | POA: Diagnosis not present

## 2017-07-15 NOTE — Addendum Note (Signed)
Addendum  created 07/15/17 0854 by Piscitello, Cleda MccreedyJoseph K, MD   Attestation recorded in Intraprocedure, Intraprocedure Attestations filed

## 2017-07-24 ENCOUNTER — Encounter
Admission: RE | Disposition: A | Discharge status: Home / Self Care | Payer: Self-pay | Source: Ambulatory Visit | Attending: Gastroenterology

## 2017-07-24 ENCOUNTER — Ambulatory Visit
Admission: RE | Admit: 2017-07-24 | Discharge: 2017-07-24 | Disposition: A | Discharge status: Home / Self Care | Payer: Medicare HMO | Source: Ambulatory Visit | Attending: Gastroenterology | Admitting: Gastroenterology

## 2017-07-24 ENCOUNTER — Ambulatory Visit: Payer: Medicare HMO | Admitting: Anesthesiology

## 2017-07-24 DIAGNOSIS — I739 Peripheral vascular disease, unspecified: Secondary | ICD-10-CM | POA: Diagnosis not present

## 2017-07-24 DIAGNOSIS — I509 Heart failure, unspecified: Secondary | ICD-10-CM | POA: Insufficient documentation

## 2017-07-24 DIAGNOSIS — Z79891 Long term (current) use of opiate analgesic: Secondary | ICD-10-CM | POA: Insufficient documentation

## 2017-07-24 DIAGNOSIS — I110 Hypertensive heart disease with heart failure: Secondary | ICD-10-CM | POA: Diagnosis not present

## 2017-07-24 DIAGNOSIS — R69 Illness, unspecified: Secondary | ICD-10-CM | POA: Diagnosis not present

## 2017-07-24 DIAGNOSIS — R634 Abnormal weight loss: Secondary | ICD-10-CM | POA: Insufficient documentation

## 2017-07-24 DIAGNOSIS — Z955 Presence of coronary angioplasty implant and graft: Secondary | ICD-10-CM | POA: Diagnosis not present

## 2017-07-24 DIAGNOSIS — K219 Gastro-esophageal reflux disease without esophagitis: Secondary | ICD-10-CM | POA: Insufficient documentation

## 2017-07-24 DIAGNOSIS — F17210 Nicotine dependence, cigarettes, uncomplicated: Secondary | ICD-10-CM | POA: Insufficient documentation

## 2017-07-24 DIAGNOSIS — I2510 Atherosclerotic heart disease of native coronary artery without angina pectoris: Secondary | ICD-10-CM | POA: Insufficient documentation

## 2017-07-24 DIAGNOSIS — F329 Major depressive disorder, single episode, unspecified: Secondary | ICD-10-CM | POA: Diagnosis not present

## 2017-07-24 DIAGNOSIS — F419 Anxiety disorder, unspecified: Secondary | ICD-10-CM | POA: Diagnosis not present

## 2017-07-24 DIAGNOSIS — I252 Old myocardial infarction: Secondary | ICD-10-CM | POA: Diagnosis not present

## 2017-07-24 DIAGNOSIS — Z7982 Long term (current) use of aspirin: Secondary | ICD-10-CM | POA: Insufficient documentation

## 2017-07-24 DIAGNOSIS — Z7902 Long term (current) use of antithrombotics/antiplatelets: Secondary | ICD-10-CM | POA: Diagnosis not present

## 2017-07-24 DIAGNOSIS — K5730 Diverticulosis of large intestine without perforation or abscess without bleeding: Secondary | ICD-10-CM | POA: Insufficient documentation

## 2017-07-24 DIAGNOSIS — E785 Hyperlipidemia, unspecified: Secondary | ICD-10-CM | POA: Diagnosis not present

## 2017-07-24 DIAGNOSIS — Z79899 Other long term (current) drug therapy: Secondary | ICD-10-CM | POA: Diagnosis not present

## 2017-07-24 DIAGNOSIS — I25110 Atherosclerotic heart disease of native coronary artery with unstable angina pectoris: Secondary | ICD-10-CM | POA: Diagnosis not present

## 2017-07-24 HISTORY — PX: COLONOSCOPY WITH PROPOFOL: SHX5780

## 2017-07-24 SURGERY — COLONOSCOPY WITH PROPOFOL
Anesthesia: General

## 2017-07-24 MED ORDER — PHENYLEPHRINE HCL 10 MG/ML IJ SOLN
INTRAMUSCULAR | Status: DC | PRN
  Administered 2017-07-24: 100 ug via INTRAVENOUS

## 2017-07-24 MED ORDER — PROPOFOL 10 MG/ML IV BOLUS
INTRAVENOUS | Status: AC
Start: 2017-07-24 — End: 2017-07-24
  Filled 2017-07-24: qty 80

## 2017-07-24 MED ORDER — LIDOCAINE HCL (PF) 1 % IJ SOLN
INTRAMUSCULAR | Status: AC
  Administered 2017-07-24: 0.3 mL
  Filled 2017-07-24: qty 2

## 2017-07-24 MED ORDER — PROPOFOL 10 MG/ML IV BOLUS
INTRAVENOUS | Status: DC | PRN
  Administered 2017-07-24: 50 mg via INTRAVENOUS

## 2017-07-24 MED ORDER — SODIUM CHLORIDE 0.9 % IV SOLN
INTRAVENOUS | Status: DC
  Administered 2017-07-24: 1000 mL via INTRAVENOUS

## 2017-07-24 MED ORDER — PROPOFOL 500 MG/50ML IV EMUL
INTRAVENOUS | Status: DC | PRN
  Administered 2017-07-24: 100 ug/kg/min via INTRAVENOUS

## 2017-07-24 NOTE — Op Note (Signed)
[facility] Gastroenterology Patient Name: Martin Hernandez Procedure Date: 07/24/2017 10:10 AM MRN: [id number] Account #: [id number] Date of Birth: 26-Jul-1955 Admit Type: Outpatient Age: 62 Room: [facility] ENDO ROOM 1 Gender: Male Note Status: Finalized Procedure:            Colonoscopy Indications:          Weight loss Providers:            Wyline Mood MD, MD Referring MD:         Pasty Spillers Mclean-Scocuzza MD, MD (Referring MD) Medicines:            Monitored Anesthesia Care Complications:        No immediate complications. Procedure:            Pre-Anesthesia Assessment:                       - Prior to the procedure, a History and Physical was                        performed, and patient medications, allergies and                        sensitivities were reviewed. The patient's tolerance of                        previous anesthesia was reviewed.                       - The risks and benefits of the procedure and the                        sedation options and risks were discussed with the                        patient. All questions were answered and informed                        consent was obtained.                       - ASA Grade Assessment: III - A patient with severe                        systemic disease.                       After obtaining informed consent, the colonoscope was                        passed under direct vision. Throughout the procedure,                        the patient's blood pressure, pulse, and oxygen                        saturations were monitored continuously. The                        Colonoscope was introduced through the anus and  advanced to the the cecum, identified by the                        appendiceal orifice, IC valve and transillumination.                        The colonoscopy was performed with ease. The patient                        tolerated the procedure well. The quality of the  bowel                        preparation was poor. Findings:      The perianal and digital rectal examinations were normal.      Multiple small-mouthed diverticula were found in the sigmoid colon.      The exam was otherwise without abnormality on direct and retroflexion       views. Impression:           - Preparation of the colon was poor.                       - Diverticulosis in the sigmoid colon.                       - The examination was otherwise normal on direct and                        retroflexion views.                       - No specimens collected. Recommendation:       - Discharge patient to home (with escort).                       - Resume previous diet.                       - Continue present medications.                       - No gross lesion in the colon to attribute to weight                        loss. Pep inadequate to screen for colon polyps                       - Return to my office in 4 weeks. Procedure Code(s):    --- Professional ---                       [phone], Colonoscopy, flexible; diagnostic, including                        collection of specimen(s) by brushing or washing, when                        performed (separate procedure) Diagnosis Code(s):    --- Professional ---                       R63.4, Abnormal weight loss  K57.30, Diverticulosis of large intestine without                        perforation or abscess without bleeding CPT copyright 2017 American Medical Association. All rights reserved. The codes documented in this report are preliminary and upon coder review may  be revised to meet current compliance requirements. Wyline Mood, MD Wyline Mood MD, MD 07/24/2017 10:49:57 AM This report has been signed electronically. Number of Addenda: 0 Note Initiated On: 07/24/2017 10:10 AM Scope Withdrawal Time: 0 hours 12 minutes 2 seconds  Total Procedure Duration: 0 hours 16 minutes 43 seconds       [facility]

## 2017-07-24 NOTE — H&P (Addendum)
Wyline Mood, MD [address], [address], [place], [place], [address] [address], [address], [place], [place], [address] Phone: [phone]  Fax: [phone]  Primary Care Physician:  McLean-Scocuzza, Pasty Spillers, MD   Pre-Procedure History & Physical: HPI:  Martin Hernandez is a 62 y.o. male is here for an colonoscopy.   Past Medical History:  Diagnosis Date  . Allergy   . CAD (coronary artery disease)   . Depression   . Frequent headaches   . GERD (gastroesophageal reflux disease)   . Headache   . Hyperlipidemia   . Hypertension   . PAD (peripheral artery disease) (HCC)   . Tobacco abuse     Past Surgical History:  Procedure Laterality Date  . COLONOSCOPY WITH PROPOFOL N/A 06/20/2017   Procedure: COLONOSCOPY WITH PROPOFOL;  Surgeon: Wyline Mood, MD;  Location: [facility] ENDOSCOPY;  Service: Gastroenterology;  Laterality: N/A;  . ESOPHAGOGASTRODUODENOSCOPY (EGD) WITH PROPOFOL N/A 06/20/2017   Procedure: ESOPHAGOGASTRODUODENOSCOPY (EGD) WITH PROPOFOL;  Surgeon: Wyline Mood, MD;  Location: [facility] ENDOSCOPY;  Service: Gastroenterology;  Laterality: N/A;  . PERCUTANEOUS CORONARY STENT INTERVENTION (PCI-S)    . STOMACH SURGERY     2009 [facility] per chart review pt had AAA repair   . TONSILLECTOMY      Prior to Admission medications   Medication Sig Start Date End Date Taking? Authorizing Provider  aspirin 81 MG tablet Take 81 mg by mouth daily.    [provider]  atorvastatin (LIPITOR) 80 MG tablet Take 1 tablet (80 mg total) by mouth daily at 6 PM. 09/14/16   Tommie Sams, DO  baclofen (LIORESAL) 10 MG tablet Take 0.5 tablets (5 mg total) by mouth 2 (two) times daily. 02/28/17   McLean-Scocuzza, Pasty Spillers, MD  Cholecalciferol (VITAMIN D3) 1000 units CAPS Take by mouth.    [provider]  citalopram (CELEXA) 10 MG tablet Take 1 tablet (10 mg total) by mouth daily. Patient not taking: Reported on 06/14/2017 08/08/16   Tommie Sams, DO  clopidogrel (PLAVIX) 75 MG  tablet TAKE ONE (1) TABLET EACH DAY 09/14/16   Everlene Other G, DO  cyanocobalamin 100 MCG tablet Take 100 mcg by mouth daily.    [provider]  folic acid (FOLVITE) 1 MG tablet Take 1 mg by mouth daily.    [provider]  gabapentin (NEURONTIN) 300 MG capsule  03/12/17   [provider]  ipratropium (ATROVENT) 0.02 % nebulizer solution Inhale into the lungs. 12/07/14   [provider]  isosorbide mononitrate (IMDUR) 30 MG 24 hr tablet Take 30 mg 2 (two) times daily by mouth.     [provider]  lisinopril (PRINIVIL,ZESTRIL) 5 MG tablet TAKE ONE (1) TABLET EACH DAY 09/14/16   Tommie Sams, DO  Melatonin 3 MG TABS Take 3 mg by mouth.    [provider]  metoprolol succinate (TOPROL-XL) 25 MG 24 hr tablet  02/22/17   [provider]  mirtazapine (REMERON) 30 MG tablet  02/22/17   [provider]  nitroGLYCERIN (NITROSTAT) 0.4 MG SL tablet Place 1 tablet (0.4 mg total) under the tongue every 5 (five) minutes as needed for chest pain. 10/19/16   Tommie Sams, DO  omeprazole (PRILOSEC) 40 MG capsule Take 1 capsule (40 mg total) by mouth daily. 07/04/17   Wyline Mood, MD  oxyCODONE-acetaminophen [facility]) 10-325 MG tablet  05/18/16   [provider]  pantoprazole (PROTONIX) 40 MG tablet Take 1 tablet (40 mg total)  by mouth daily. 09/14/16   Tommie Samsook, Jayce G, DO  pyridoxine (B-6) 100 MG tablet Take 1 tablet (100 mg total) by mouth daily. 09/14/16   Tommie Samsook, Jayce G, DO  pyridOXINE (VITAMIN B-6) 100 MG tablet Take by mouth.    [provider]  ZOHYDRO ER 15 MG C12A  02/13/17   [provider]  zolpidem (AMBIEN) 10 MG tablet Take 1 tablet (10 mg total) by mouth at bedtime as needed for sleep. 05/28/17   McLean-Scocuzza, Pasty Spillersracy N, MD    Allergies as of 07/09/2017  . (No Known Allergies)    Family History  Problem Relation Age of Onset  . Diabetes Mother   . Heart disease Mother   . Hypertension Mother   . Stroke  Mother   . Prostate cancer Father   . Cancer Father        prostate cancer in 6650s  . Diabetes Sister   . Heart disease Sister   . Ulcers Other        unknown who had ulcers in family     Social History   Socioeconomic History  . Marital status: Married    Spouse name: Not on file  . Number of children: Not on file  . Years of education: Not on file  . Highest education level: Not on file  Occupational History  . Not on file  Social Needs  . Financial resource strain: Not on file  . Food insecurity:    Worry: Not on file    Inability: Not on file  . Transportation needs:    Medical: Not on file    Non-medical: Not on file  Tobacco Use  . Smoking status: Current Every Day Smoker    Packs/day: 0.25    Types: Cigarettes  . Smokeless tobacco: Never Used  Substance and Sexual Activity  . Alcohol use: No    Alcohol/week: 0.0 oz    Comment: quit 1 year ago  . Drug use: No  . Sexual activity: Not Currently  Lifestyle  . Physical activity:    Days per week: Not on file    Minutes per session: Not on file  . Stress: Not on file  Relationships  . Social connections:    Talks on phone: Not on file    Gets together: Not on file    Attends religious service: Not on file    Active member of club or organization: Not on file    Attends meetings of clubs or organizations: Not on file    Relationship status: Not on file  . Intimate partner violence:    Fear of current or ex partner: Not on file    Emotionally abused: Not on file    Physically abused: Not on file    Forced sexual activity: Not on file  Other Topics Concern  . Not on file  Social History Narrative   Disabled    4 kids 2 living    Used to do mill work    Smoker since age 89 max 2 pk per week now 1 ppd as of 02/2017     Review of Systems: See HPI, otherwise negative ROS  Physical Exam: BP (!) 121/93   Pulse 69   Temp (!) 96.7 F (35.9 C) (Tympanic)   Resp 16   Ht 5\' 9"  (1.753 m)   Wt 137 lb (62.1  kg)   SpO2 98%   BMI 20.23 kg/m  General:   Alert,  pleasant and cooperative in  NAD Head:  Normocephalic and atraumatic. Neck:  Supple; no masses or thyromegaly. Lungs:  Clear throughout to auscultation, normal respiratory effort.    Heart:  +S1, +S2, Regular rate and rhythm, No edema. Abdomen:  Soft, nontender and nondistended. Normal bowel sounds, without guarding, and without rebound.   Neurologic:  Alert and  oriented x4;  grossly normal neurologically.  Impression/Plan: Martin Hernandez is here for an colonoscopy to be performed for weight loss.   Risks, benefits, limitations, and alternatives regarding  colonoscopy have been reviewed with the patient.  Questions have been answered.  All parties agreeable.   Wyline Mood, MD  07/24/2017, 10:23 AM

## 2017-07-24 NOTE — Anesthesia Postprocedure Evaluation (Signed)
Anesthesia Post Note  Patient: Martin Hernandez  Procedure(s) Performed: COLONOSCOPY WITH PROPOFOL (N/A )  Patient location during evaluation: Endoscopy Anesthesia Type: General Level of consciousness: awake and alert Pain management: pain level controlled Vital Signs Assessment: post-procedure vital signs reviewed and stable Respiratory status: spontaneous breathing and respiratory function stable Cardiovascular status: stable Anesthetic complications: no     Last Vitals:  Vitals:   07/24/17 1050 07/24/17 1110  BP: 108/72 120/87  Pulse:    Resp: 18   Temp: (!) 36.2 C   SpO2: 100%     Last Pain:  Vitals:   07/24/17 1110  TempSrc:   PainSc: 0-No pain                 Latrease Kunde K

## 2017-07-24 NOTE — Anesthesia Post-op Follow-up Note (Signed)
Anesthesia QCDR form completed.        

## 2017-07-24 NOTE — Transfer of Care (Signed)
Immediate Anesthesia Transfer of Care Note  Patient: Martin Hernandez  Procedure(s) Performed: COLONOSCOPY WITH PROPOFOL (N/A )  Patient Location: PACU and Endoscopy Unit  Anesthesia Type:General  Level of Consciousness: awake, drowsy and patient cooperative  Airway & Oxygen Therapy: Patient Spontanous Breathing  Post-op Assessment: Report given to RN, Post -op Vital signs reviewed and stable and Patient moving all extremities  Post vital signs: Reviewed and stable  Last Vitals:  Vitals Value Taken Time  BP 108/72 07/24/2017 10:51 AM  Temp 36.2 C 07/24/2017 10:50 AM  Pulse 64 07/24/2017 10:52 AM  Resp 16 07/24/2017 10:53 AM  SpO2 99 % 07/24/2017 10:52 AM  Vitals shown include unvalidated device data.  Last Pain:  Vitals:   07/24/17 1050  TempSrc: Tympanic  PainSc: Asleep         Complications: No apparent anesthesia complications

## 2017-07-24 NOTE — Anesthesia Preprocedure Evaluation (Signed)
Anesthesia Evaluation  Patient identified by MRN, date of birth, ID band Patient awake    Reviewed: Allergy & Precautions, NPO status , Patient's Chart, lab work & pertinent test results  History of Anesthesia Complications Negative for: history of anesthetic complications  Airway Mallampati: II       Dental  (+) Poor Dentition, Missing, Chipped   Pulmonary neg sleep apnea, neg COPD, Current Smoker,           Cardiovascular hypertension, Pt. on medications + CAD, + Past MI, + Cardiac Stents, + Peripheral Vascular Disease and +CHF  + Cardiac Defibrillator (-) Valvular Problems/Murmurs     Neuro/Psych neg Seizures Anxiety Depression    GI/Hepatic Neg liver ROS, GERD (none in a long time)  ,  Endo/Other  neg diabetes  Renal/GU negative Renal ROS     Musculoskeletal   Abdominal   Peds  Hematology   Anesthesia Other Findings   Reproductive/Obstetrics                             Anesthesia Physical Anesthesia Plan  ASA: III  Anesthesia Plan: General   Post-op Pain Management:    Induction: Intravenous  PONV Risk Score and Plan: 1 and Propofol infusion and TIVA  Airway Management Planned: Nasal Cannula  Additional Equipment:   Intra-op Plan:   Post-operative Plan:   Informed Consent: I have reviewed the patients History and Physical, chart, labs and discussed the procedure including the risks, benefits and alternatives for the proposed anesthesia with the patient or authorized representative who has indicated his/her understanding and acceptance.     Plan Discussed with:   Anesthesia Plan Comments:         Anesthesia Quick Evaluation

## 2017-07-25 ENCOUNTER — Encounter: Payer: Self-pay | Admitting: Gastroenterology

## 2017-07-27 ENCOUNTER — Other Ambulatory Visit: Payer: Self-pay | Admitting: Family Medicine

## 2017-08-01 ENCOUNTER — Telehealth: Payer: Self-pay

## 2017-08-01 ENCOUNTER — Encounter: Payer: Self-pay | Admitting: Gastroenterology

## 2017-08-01 ENCOUNTER — Encounter (INDEPENDENT_AMBULATORY_CARE_PROVIDER_SITE_OTHER): Payer: Self-pay

## 2017-08-01 ENCOUNTER — Other Ambulatory Visit
Admission: RE | Admit: 2017-08-01 | Discharge: 2017-08-01 | Disposition: A | Discharge status: Home / Self Care | Payer: Medicare HMO | Source: Ambulatory Visit | Attending: Gastroenterology | Admitting: Gastroenterology

## 2017-08-01 ENCOUNTER — Ambulatory Visit (INDEPENDENT_AMBULATORY_CARE_PROVIDER_SITE_OTHER): Payer: Medicare HMO | Admitting: Gastroenterology

## 2017-08-01 ENCOUNTER — Other Ambulatory Visit: Payer: Self-pay | Admitting: Internal Medicine

## 2017-08-01 VITALS — BP 129/85 | HR 78 | Ht 70.0 in | Wt 132.8 lb

## 2017-08-01 DIAGNOSIS — K581 Irritable bowel syndrome with constipation: Secondary | ICD-10-CM

## 2017-08-01 DIAGNOSIS — R634 Abnormal weight loss: Secondary | ICD-10-CM | POA: Diagnosis not present

## 2017-08-01 DIAGNOSIS — Z01812 Encounter for preprocedural laboratory examination: Secondary | ICD-10-CM | POA: Diagnosis not present

## 2017-08-01 DIAGNOSIS — Z720 Tobacco use: Secondary | ICD-10-CM

## 2017-08-01 LAB — BASIC METABOLIC PANEL
ANION GAP: 6 (ref 5–15)
BUN: 17 mg/dL (ref 6–20)
CO2: 28 mmol/L (ref 22–32)
Calcium: 8.5 mg/dL — ABNORMAL LOW (ref 8.9–10.3)
Chloride: 108 mmol/L (ref 101–111)
Creatinine, Ser: 0.96 mg/dL (ref 0.61–1.24)
GFR calc Af Amer: >60 mL/min (ref >60)
GFR calc non Af Amer: >60 mL/min (ref >60)
GLUCOSE: 60 mg/dL — AB (ref 65–99)
Potassium: 3.4 mmol/L — ABNORMAL LOW (ref 3.5–5.1)
Sodium: 142 mmol/L (ref 135–145)

## 2017-08-01 MED ORDER — LINACLOTIDE 145 MCG PO CAPS: 145.0000 ug | ORAL_CAPSULE | Freq: Every day | ORAL | 1 refills | Status: DC

## 2017-08-01 NOTE — Patient Instructions (Signed)
1. Have labs completed today.   2. Pick-up contrast today at [facility].  3. CT of chest and abdomen ordered for Friday, 4/26 @ 215pm. Nothing to eat or drink for 4 hours before the scan.

## 2017-08-01 NOTE — Progress Notes (Signed)
Wyline MoodKiran Phala Schraeder MD, MRCP(U.K) [address]  [address]  [place], [place] [address]  Main: [phone]  Fax: [phone]   Primary Care Physician: McLean-Scocuzza, Pasty Spillersracy N, MD  Primary Gastroenterologist:  Dr. Wyline MoodKiran Noel Rodier   Chief Complaint  Patient presents with  . Follow-up    HPI: Martin Hernandez is a 62 y.o. male   Summary of history :  He was initially seen on 06/14/17 when he was referred for abdominal pain and weight loss. 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 Epic shows Since 02/2017 gained 4 lbs .   The abdominal pain has been ongoing for 3 months , all day long , lower abdomen , no aggrevating factors, better when he lays on his side , does mention about hard stools on days when he has more symptoms and does have relief with bowel movements , lots of gas    Interval history   06/14/2017-  08/01/2017 Lost 5 lbs since last visit.    06/2017 : HIV test negative, H pylori stool antigen negative.  EGD 06/2017- gastro-duodenitis.  Colonoscopy 06/2017 , 07/2017 -poor prep but not gross lesions to explain weight loss. Prep was inadequate for bowel prep.   Says apetite is poor. The abdominal pain is much better. The linzess works really well and has a bowel movement atleast once a day .       Current Outpatient Medications  Medication Sig Dispense Refill  . aspirin 81 MG tablet Take 81 mg by mouth daily.    Marland Kitchen. atorvastatin (LIPITOR) 80 MG tablet Take 1 tablet (80 mg total) by mouth daily at 6 PM. 90 tablet 3  . baclofen (LIORESAL) 10 MG tablet Take 0.5 tablets (5 mg total) by mouth 2 (two) times daily. 60 tablet 2  . Cholecalciferol (VITAMIN D3) 1000 units CAPS Take by mouth.    . clopidogrel (PLAVIX) 75 MG tablet TAKE ONE (1) TABLET EACH DAY 90 tablet 2  . [employer] CITRATE OF MAGNESIA SOLN TAKE 296 MLS (1 BOTTLE TOTAL) BY MOUTH ONCE FOR 1 DOSE.  0  . cyanocobalamin 100 MCG tablet Take 100 mcg by mouth daily.    . folic acid (FOLVITE) 1 MG tablet Take  1 mg by mouth daily.    Marland Kitchen. gabapentin (NEURONTIN) 300 MG capsule     . ipratropium (ATROVENT) 0.02 % nebulizer solution Inhale into the lungs.    . isosorbide mononitrate (IMDUR) 30 MG 24 hr tablet Take 30 mg 2 (two) times daily by mouth.     . linaclotide (LINZESS) 145 MCG CAPS capsule Take 1 capsule (145 mcg total) by mouth daily before breakfast. 61 capsule 1  . lisinopril (PRINIVIL,ZESTRIL) 5 MG tablet TAKE ONE (1) TABLET EACH DAY 90 tablet 3  . Melatonin 3 MG TABS Take 3 mg by mouth.    . metoprolol succinate (TOPROL-XL) 25 MG 24 hr tablet     . mirtazapine (REMERON) 30 MG tablet     . nitroGLYCERIN (NITROSTAT) 0.4 MG SL tablet Place 1 tablet (0.4 mg total) under the tongue every 5 (five) minutes as needed for chest pain. 50 tablet 3  . omeprazole (PRILOSEC) 40 MG capsule Take 1 capsule (40 mg total) by mouth daily. 30 capsule 3  . oxyCODONE-acetaminophen (PERCOCET) 10-325 MG tablet     . pantoprazole (PROTONIX) 40 MG tablet Take 1 tablet (40 mg total) by mouth daily. 90 tablet 3  . pyridoxine (B-6) 100 MG tablet Take 1 tablet (100 mg total) by  mouth daily. 90 tablet 3  . pyridOXINE (VITAMIN B-6) 100 MG tablet Take by mouth.    . thiamine 100 MG tablet Take by mouth.    Marland Kitchen ZOHYDRO ER 15 MG C12A     . zolpidem (AMBIEN) 10 MG tablet Take 1 tablet (10 mg total) by mouth at bedtime as needed for sleep. 30 tablet 2  . citalopram (CELEXA) 10 MG tablet Take 1 tablet (10 mg total) by mouth daily. (Patient not taking: Reported on 06/14/2017) 90 tablet 0   No current facility-administered medications for this visit.     Allergies as of 08/01/2017  . (No Known Allergies)    ROS:  General: Negative for anorexia, weight loss, fever, chills, fatigue, weakness. ENT: Negative for hoarseness, difficulty swallowing , nasal congestion. CV: Negative for chest pain, angina, palpitations, dyspnea on exertion, peripheral edema.  Respiratory: Negative for dyspnea at rest, dyspnea on exertion, cough, sputum,  wheezing.  GI: See history of present illness. GU:  Negative for dysuria, hematuria, urinary incontinence, urinary frequency, nocturnal urination.  Endo: Negative for unusual weight change.    Physical Examination:   BP 129/85   Pulse 78   Ht 5\' 10"  (1.778 m)   Wt 132 lb 12.8 oz (60.2 kg)   BMI 19.05 kg/m   General: Well-nourished, well-developed in no acute distress.  Eyes: No icterus. Conjunctivae pink. Mouth: Oropharyngeal mucosa moist and pink , no lesions erythema or exudate. Lungs: Clear to auscultation bilaterally. Non-labored. Heart: Regular rate and rhythm, no murmurs rubs or gallops.  Abdomen: Bowel sounds are normal, nontender, nondistended, no hepatosplenomegaly or masses, no abdominal bruits or hernia , no rebound or guarding.   Extremities: No lower extremity edema. No clubbing or deformities. Neuro: Alert and oriented x 3.  Grossly intact. Skin: Warm and dry, no jaundice.   Psych: Alert and cooperative, normal mood and affect.   Imaging Studies: No results found.  Assessment and Plan:   Martin Hernandez is a 62 y.o. 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 I strongly suggest chest and abdominal imaging to rule out any underlying neoplasm.   Plan  1.Linzess 145 mcg - script provided 2. CT chest/abdomen and pelvis to evaluate unintentional weight loss.  3. Ensure TID  Dr Wyline Mood  MD,MRCP [facility]) Follow up in 4 weeks

## 2017-08-01 NOTE — Telephone Encounter (Signed)
Advised patient of results per Dr. Tobi BastosAnna.    - inform sugars low- suggest to drink a sweet beverage like orange juice and check his sugars . Potassium low- consume fruit juice and bananas   Spoke to both patient and his spouse.

## 2017-08-02 ENCOUNTER — Telehealth: Payer: Self-pay

## 2017-08-02 NOTE — Telephone Encounter (Signed)
Updated Mr. Wollin Franklin Resourcesconcerning insurance approval, date and location change.  Insurance approved CT of the Chest only.   [employer] states CT of the Abd / Pelvis would have to be appealed by Dr. Orvil FeilScocuzza's office due to [facility].   Original Denial for CT of the Abd / Pelvis states a thorough work-up has to be completed for approval including: Labs (endocine evaluation) Upper/Lower GI (completed by Dr. Tobi BastosAnna) Abdominal Ultrasound Chest X-ray  This information has been sent to Dr. Nickolas MadridScocuzza for follow-up.   CT [facility] 08/16/17 @ 1045am.

## 2017-08-03 ENCOUNTER — Other Ambulatory Visit: Payer: Self-pay

## 2017-08-03 ENCOUNTER — Ambulatory Visit: Admission: RE | Admit: 2017-08-03 | Payer: Medicare HMO | Source: Ambulatory Visit

## 2017-08-03 DIAGNOSIS — R634 Abnormal weight loss: Secondary | ICD-10-CM

## 2017-08-03 NOTE — Progress Notes (Signed)
Ct abd pelvis w/ & w/out contrast approved thru [employer]:  Approval: [id number]: [id number] Date: 4/26 - 7/25

## 2017-08-09 DIAGNOSIS — M79609 Pain in unspecified limb: Secondary | ICD-10-CM | POA: Diagnosis not present

## 2017-08-09 DIAGNOSIS — M545 Low back pain: Secondary | ICD-10-CM | POA: Diagnosis not present

## 2017-08-09 DIAGNOSIS — Z79891 Long term (current) use of opiate analgesic: Secondary | ICD-10-CM | POA: Diagnosis not present

## 2017-08-09 DIAGNOSIS — G894 Chronic pain syndrome: Secondary | ICD-10-CM | POA: Diagnosis not present

## 2017-08-09 DIAGNOSIS — Z79899 Other long term (current) drug therapy: Secondary | ICD-10-CM | POA: Diagnosis not present

## 2017-08-09 DIAGNOSIS — M47816 Spondylosis without myelopathy or radiculopathy, lumbar region: Secondary | ICD-10-CM | POA: Diagnosis not present

## 2017-08-16 ENCOUNTER — Ambulatory Visit
Admission: RE | Admit: 2017-08-16 | Discharge: 2017-08-16 | Disposition: A | Discharge status: Home / Self Care | Payer: Medicare HMO | Source: Ambulatory Visit | Attending: Internal Medicine | Admitting: Internal Medicine

## 2017-08-16 DIAGNOSIS — I2510 Atherosclerotic heart disease of native coronary artery without angina pectoris: Secondary | ICD-10-CM | POA: Insufficient documentation

## 2017-08-16 DIAGNOSIS — N200 Calculus of kidney: Secondary | ICD-10-CM | POA: Insufficient documentation

## 2017-08-16 DIAGNOSIS — R634 Abnormal weight loss: Secondary | ICD-10-CM

## 2017-08-16 DIAGNOSIS — R935 Abnormal findings on diagnostic imaging of other abdominal regions, including retroperitoneum: Secondary | ICD-10-CM

## 2017-08-16 DIAGNOSIS — J439 Emphysema, unspecified: Secondary | ICD-10-CM | POA: Diagnosis not present

## 2017-08-16 DIAGNOSIS — R109 Unspecified abdominal pain: Secondary | ICD-10-CM | POA: Diagnosis not present

## 2017-08-16 DIAGNOSIS — I700 Atherosclerosis of aorta: Secondary | ICD-10-CM | POA: Insufficient documentation

## 2017-08-16 DIAGNOSIS — Z720 Tobacco use: Secondary | ICD-10-CM

## 2017-08-16 DIAGNOSIS — N289 Disorder of kidney and ureter, unspecified: Secondary | ICD-10-CM | POA: Insufficient documentation

## 2017-08-16 MED ORDER — IOPAMIDOL (ISOVUE-370) INJECTION 76%
85.0000 mL | Freq: Once | INTRAVENOUS | Status: AC | PRN
  Administered 2017-08-16: 85 mL via INTRAVENOUS

## 2017-08-23 DIAGNOSIS — Z4502 Encounter for adjustment and management of automatic implantable cardiac defibrillator: Secondary | ICD-10-CM | POA: Diagnosis not present

## 2017-08-23 DIAGNOSIS — I255 Ischemic cardiomyopathy: Secondary | ICD-10-CM | POA: Diagnosis not present

## 2017-08-23 DIAGNOSIS — Z95810 Presence of automatic (implantable) cardiac defibrillator: Secondary | ICD-10-CM | POA: Diagnosis not present

## 2017-08-26 ENCOUNTER — Other Ambulatory Visit: Payer: Self-pay | Admitting: Family Medicine

## 2017-08-27 NOTE — Telephone Encounter (Signed)
Patient las visit with PCP 2/19 these are all historical meds from previous PCP OK to refill?

## 2017-08-29 ENCOUNTER — Other Ambulatory Visit: Payer: Self-pay | Admitting: Internal Medicine

## 2017-08-29 DIAGNOSIS — G4700 Insomnia, unspecified: Secondary | ICD-10-CM

## 2017-08-29 DIAGNOSIS — M62838 Other muscle spasm: Secondary | ICD-10-CM

## 2017-08-29 MED ORDER — BACLOFEN 10 MG PO TABS: 5.0000 mg | ORAL_TABLET | Freq: Two times a day (BID) | ORAL | 5 refills | Status: DC

## 2017-08-29 MED ORDER — ZOLPIDEM TARTRATE 10 MG PO TABS: 10.0000 mg | ORAL_TABLET | Freq: Every evening | ORAL | 2 refills | Status: DC | PRN

## 2017-08-29 MED ORDER — VITAMIN B-6 100 MG PO TABS: 100.0000 mg | ORAL_TABLET | Freq: Every day | ORAL | 3 refills | Status: DC

## 2017-09-12 DIAGNOSIS — M47816 Spondylosis without myelopathy or radiculopathy, lumbar region: Secondary | ICD-10-CM | POA: Diagnosis not present

## 2017-09-12 DIAGNOSIS — M79609 Pain in unspecified limb: Secondary | ICD-10-CM | POA: Diagnosis not present

## 2017-09-12 DIAGNOSIS — G894 Chronic pain syndrome: Secondary | ICD-10-CM | POA: Diagnosis not present

## 2017-09-19 DIAGNOSIS — I2510 Atherosclerotic heart disease of native coronary artery without angina pectoris: Secondary | ICD-10-CM | POA: Diagnosis not present

## 2017-09-19 DIAGNOSIS — I252 Old myocardial infarction: Secondary | ICD-10-CM | POA: Diagnosis not present

## 2017-09-19 DIAGNOSIS — G4700 Insomnia, unspecified: Secondary | ICD-10-CM | POA: Diagnosis not present

## 2017-09-19 DIAGNOSIS — R69 Illness, unspecified: Secondary | ICD-10-CM | POA: Diagnosis not present

## 2017-09-19 DIAGNOSIS — G8929 Other chronic pain: Secondary | ICD-10-CM | POA: Diagnosis not present

## 2017-09-19 DIAGNOSIS — K219 Gastro-esophageal reflux disease without esophagitis: Secondary | ICD-10-CM | POA: Diagnosis not present

## 2017-09-19 DIAGNOSIS — M549 Dorsalgia, unspecified: Secondary | ICD-10-CM | POA: Diagnosis not present

## 2017-09-19 DIAGNOSIS — I509 Heart failure, unspecified: Secondary | ICD-10-CM | POA: Diagnosis not present

## 2017-09-19 DIAGNOSIS — E785 Hyperlipidemia, unspecified: Secondary | ICD-10-CM | POA: Diagnosis not present

## 2017-09-20 ENCOUNTER — Other Ambulatory Visit: Payer: Self-pay

## 2017-09-20 DIAGNOSIS — Z1211 Encounter for screening for malignant neoplasm of colon: Secondary | ICD-10-CM

## 2017-09-20 DIAGNOSIS — Z01812 Encounter for preprocedural laboratory examination: Secondary | ICD-10-CM

## 2017-09-20 DIAGNOSIS — K769 Liver disease, unspecified: Secondary | ICD-10-CM

## 2017-09-20 MED ORDER — PEG 3350-KCL-NA BICARB-NACL 420 G PO SOLR: 4000.0000 mL | Freq: Once | ORAL | 0 refills | Status: AC

## 2017-09-20 NOTE — Progress Notes (Signed)
Mailed a letter to patient with repeat CT request.   Order has been placed.   Provided Central Scheduling phone number.

## 2017-09-23 IMAGING — CT CT ENTEROGRAPHY (ABD-PELV W/ CM)
1 of 4 series · 13 of 32 positions shown, 18 images · IV contrast (APPLIED)
Comparison: PET-CT dated 03/09/2016. CT abdomen/ pelvis dated
02/29/2016.

CLINICAL DATA: Abnormal small bowel wall thickening on CT, without
hypermetabolism on PET

EXAM:
CT ABDOMEN AND PELVIS WITH CONTRAST (ENTEROGRAPHY)
TECHNIQUE: Multidetector CT of the abdomen and pelvis during bolus
administration of intravenous contrast. Negative oral contrast was
given.
CONTRAST:  75 mL Isovue 370

[Series 3: entero thins · axial · 0.61mm/px · z∈[-1044,-666]mm · 13 of 213 slices shown, 18 images]
[im 12/213  soft-tissue]
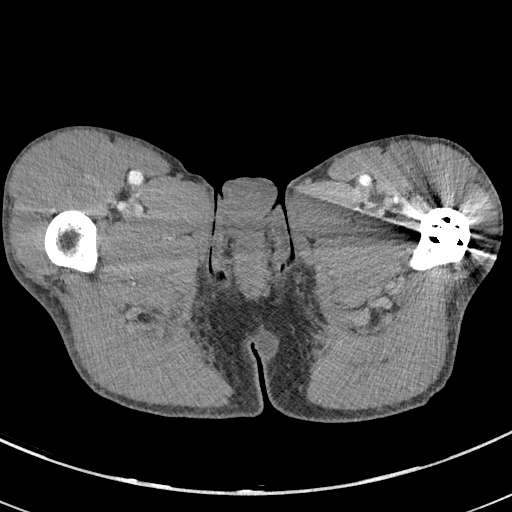
[im 12/213  bone]
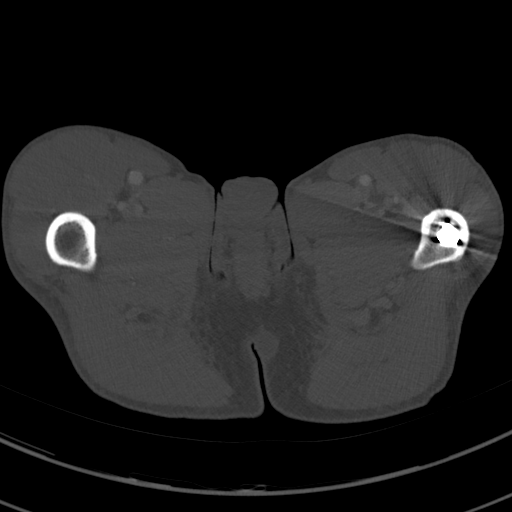
[im 34/213  soft-tissue]
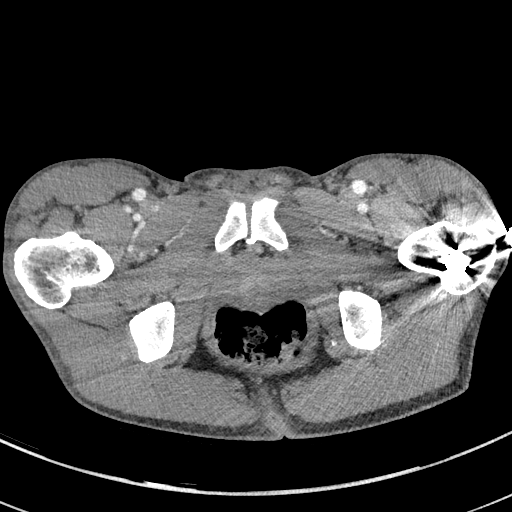
[im 45/213  soft-tissue]
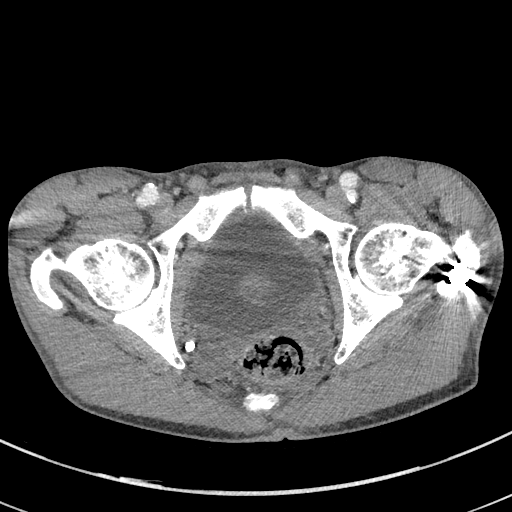
[im 67/213  soft-tissue]
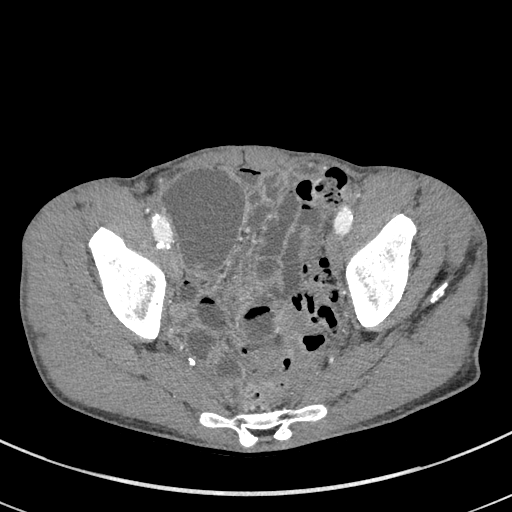
[im 79/213  soft-tissue]
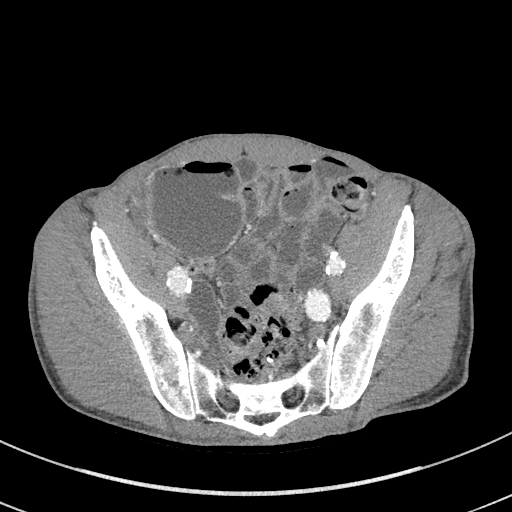
[im 101/213  soft-tissue]
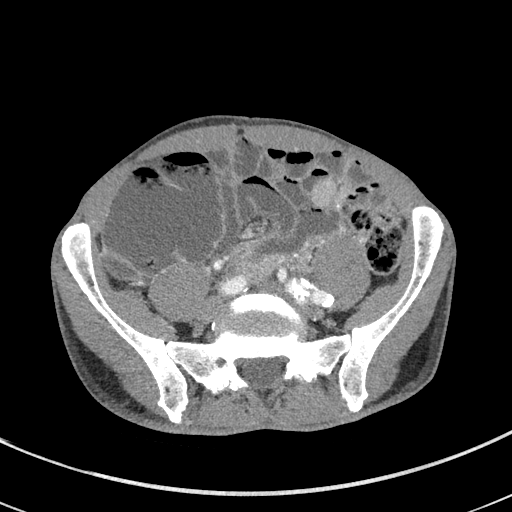
[im 112/213  soft-tissue]
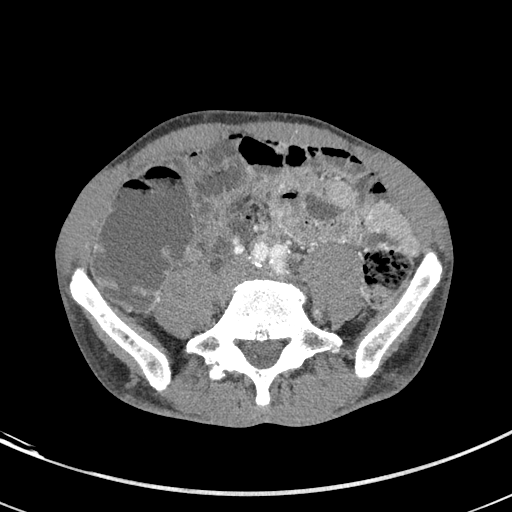
[im 134/213  soft-tissue]
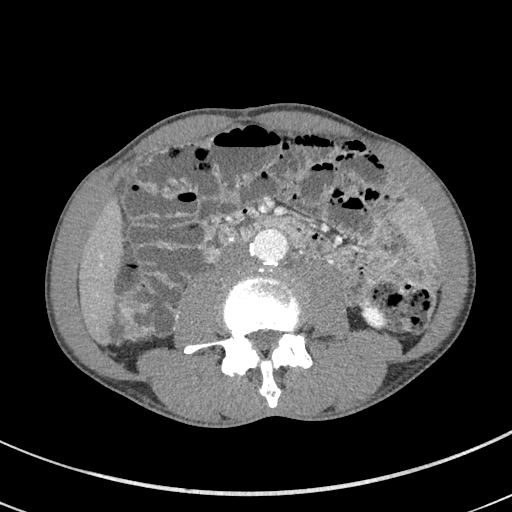
[im 146/213  soft-tissue]
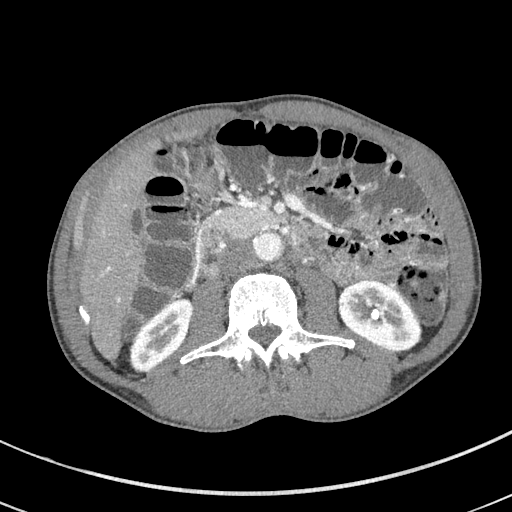
[im 146/213  bone]
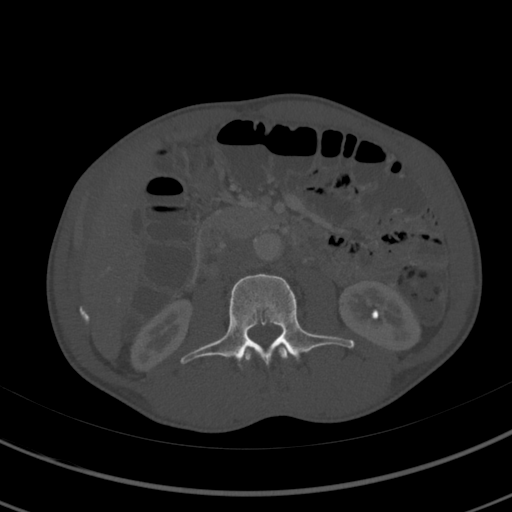
[im 168/213  soft-tissue]
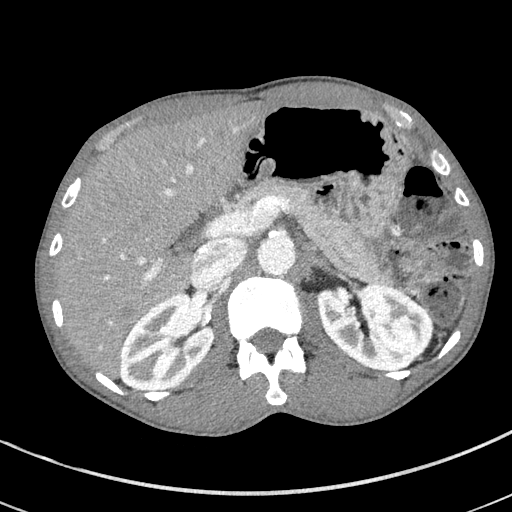
[im 168/213  lung]
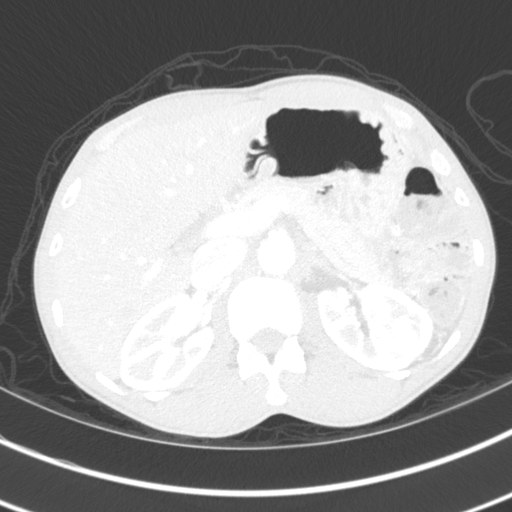
[im 179/213  soft-tissue]
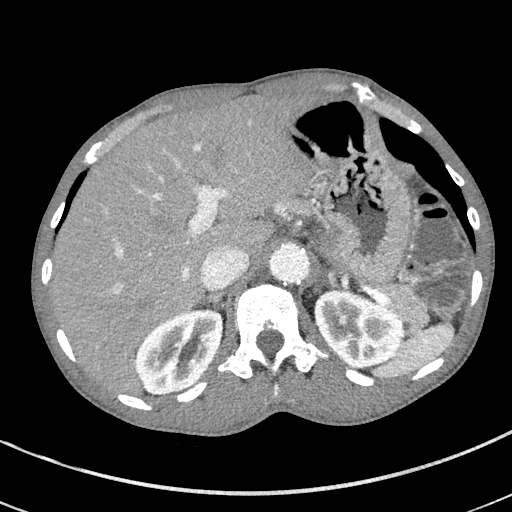
[im 179/213  lung]
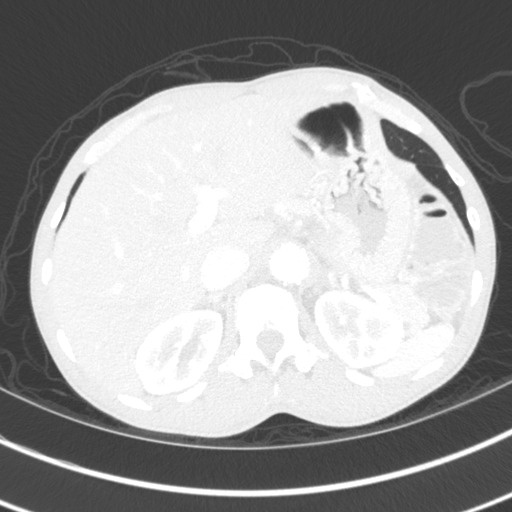
[im 190/213  lung]
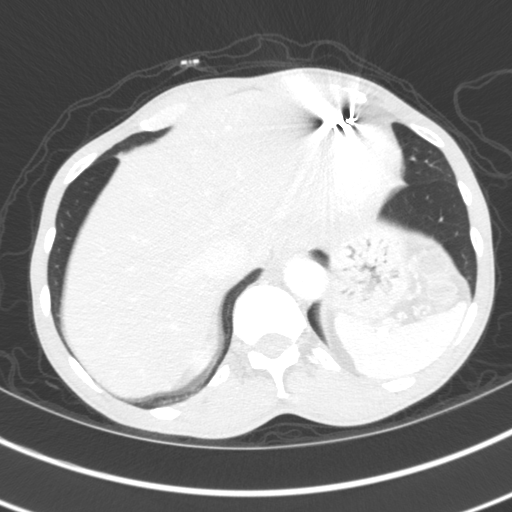
[im 201/213  soft-tissue]
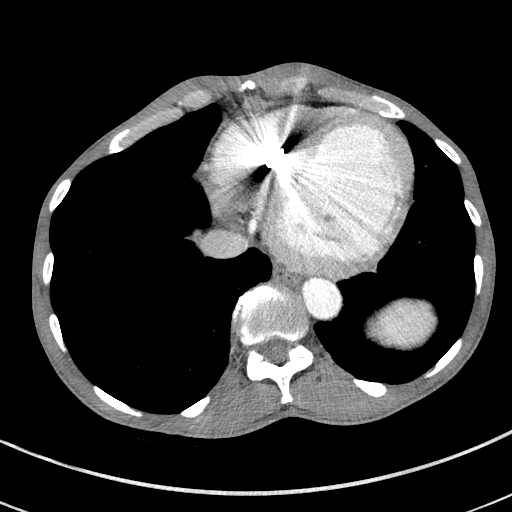
[im 201/213  lung]
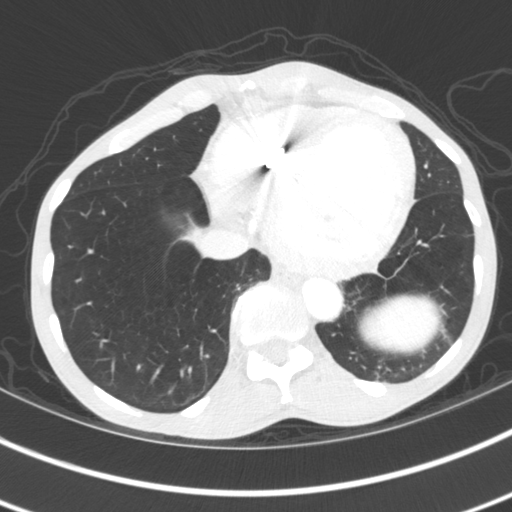

[13 of 32 positions shown; findings below may reference images not displayed]

FINDINGS: Lower chest:  Lung bases are clear.

ICD leads, incompletely visualized.

Hepatobiliary: Liver is within normal limits.

Gallbladder is unremarkable. No intrahepatic or extrahepatic ductal
dilatation.

Pancreas: Within normal limits.

Spleen: Within normal limits.

Adrenals/Urinary Tract: Adrenal glands are within normal limits.

11 mm nonobstructing left lower pole renal calculus (coronal image
56). Small left renal cysts measuring up to 12 mm in the lateral
left upper kidney (series 8/ image 5).

Right kidney is within normal limits.  No hydronephrosis.

Bladder is within normal limits.

Stomach/Bowel: Stomach is within normal limits.

No evidence bowel obstruction.

Normal appendix (series 2/image 55).

No bowel wall thickening or inflammatory changes. Specifically, the
terminal ileum is within normal limits.

Mild to moderate stool in the distal colon.

No colonic wall thickening or mass is seen.

Vascular/Lymphatic: No evidence of abdominal aortic aneurysm.

Atherosclerotic calcifications of the abdominal aorta and branch
vessels.

No suspicious abdominopelvic lymphadenopathy.

Reproductive: Prostate is unremarkable.

Other: No abdominopelvic ascites.

Musculoskeletal: Degenerative changes of the visualized
thoracolumbar spine.
IMPRESSION: No bowel wall thickening, inflammatory changes, or mass is seen.
Specifically, the terminal ileum is within normal limits.

11 mm nonobstructing left lower pole renal calculus. No ureteral or
bladder calculi. No hydronephrosis.

Additional ancillary findings as above.

## 2017-10-15 DIAGNOSIS — M79609 Pain in unspecified limb: Secondary | ICD-10-CM | POA: Diagnosis not present

## 2017-10-15 DIAGNOSIS — Z79899 Other long term (current) drug therapy: Secondary | ICD-10-CM | POA: Diagnosis not present

## 2017-10-15 DIAGNOSIS — Z79891 Long term (current) use of opiate analgesic: Secondary | ICD-10-CM | POA: Diagnosis not present

## 2017-10-15 DIAGNOSIS — M47817 Spondylosis without myelopathy or radiculopathy, lumbosacral region: Secondary | ICD-10-CM | POA: Diagnosis not present

## 2017-10-15 DIAGNOSIS — G894 Chronic pain syndrome: Secondary | ICD-10-CM | POA: Diagnosis not present

## 2017-10-15 DIAGNOSIS — M47816 Spondylosis without myelopathy or radiculopathy, lumbar region: Secondary | ICD-10-CM | POA: Diagnosis not present

## 2017-10-29 DIAGNOSIS — M47817 Spondylosis without myelopathy or radiculopathy, lumbosacral region: Secondary | ICD-10-CM | POA: Diagnosis not present

## 2017-11-05 ENCOUNTER — Ambulatory Visit (INDEPENDENT_AMBULATORY_CARE_PROVIDER_SITE_OTHER): Payer: Medicare HMO

## 2017-11-05 VITALS — BP 122/74 | HR 79 | Temp 98.2°F | Resp 15 | Ht 70.0 in | Wt 125.8 lb

## 2017-11-05 DIAGNOSIS — Z0000 Encounter for general adult medical examination without abnormal findings: Secondary | ICD-10-CM | POA: Diagnosis not present

## 2017-11-05 NOTE — Patient Instructions (Addendum)
  Martin Hernandez , Thank you for taking time to come for your Medicare Wellness Visit. I appreciate your ongoing commitment to your health goals. Please review the following plan we discussed and let me know if I can assist you in the future.   These are the goals we discussed: Goals    . Gain 20 lbs     Eat 3 meals a day Ensure Enlive supplements as a meal or between meals; may try over ice cream.        This is a list of the screening recommended for you and due dates:  Health Maintenance  Topic Date Due  . HIV Screening  01/02/1970  . Tetanus Vaccine  01/02/1974  . Flu Shot  11/08/2017  . Colon Cancer Screening  07/25/2027  .  Hepatitis C: One time screening is recommended by Center for Disease Control  (CDC) for  adults born from [id number] through 1966.   Completed

## 2017-11-05 NOTE — Progress Notes (Signed)
Reviewed will refer pt to cardiology for appt these medications for cardiology medications   TMS

## 2017-11-05 NOTE — Progress Notes (Signed)
Subjective:   Martin Hernandez is a 62 y.o. male who presents for Medicare Annual/Subsequent preventive examination.  Review of Systems:  No ROS.  Medicare Wellness Visit. Additional risk factors are reflected in the social history. Cardiac Risk Factors include: male gender;advanced age (>29men, >65 women);smoking/ tobacco exposure     Objective:    Vitals: BP 122/74 (BP Location: Left Arm, Patient Position: Sitting, Cuff Size: Normal)   Pulse 79   Temp 98.2 F (36.8 C) (Oral)   Resp 15   Ht 5\' 10"  (1.778 m)   Wt 125 lb 12.8 oz (57.1 kg)   SpO2 95%   BMI 18.05 kg/m   Body mass index is 18.05 kg/m.  Advanced Directives 11/05/2017 07/24/2017 11/02/2016 11/03/2015 09/24/2015 08/24/2015 07/29/2015  Does Patient Have a Medical Advance Directive? No Yes No No No No No  Type of Advance Directive - Healthcare Power of [place];Living will - - - - -  Does patient want to make changes to medical advance directive? - - - - - - -  Copy of Healthcare Power of Attorney in Chart? - No - copy requested - - - - -  Would patient like information on creating a medical advance directive? Yes (MAU/Ambulatory/Procedural Areas - Information given) - Yes (MAU/Ambulatory/Procedural Areas - Information given) No - patient declined information No - patient declined information No - patient declined information -    Tobacco Social History   Tobacco Use  Smoking Status Current Every Day Smoker  . Packs/day: 0.25  . Types: Cigarettes  Smokeless Tobacco Never Used     Ready to quit: No Counseling given: Not Answered   Clinical Intake:  Pre-visit preparation completed: Yes  Pain : No/denies pain     Nutritional Status: BMI of 19-24  Normal Diabetes: No(Prediabetes. Followed by pcp.)  How often do you need to have someone help you when you read instructions, pamphlets, or other written materials from your doctor or pharmacy?: 1 - Never  Interpreter Needed?: No     Past Medical History:    Diagnosis Date  . Allergy   . CAD (coronary artery disease)   . Depression   . Frequent headaches   . GERD (gastroesophageal reflux disease)   . Headache   . Hyperlipidemia   . Hypertension   . PAD (peripheral artery disease) (HCC)   . Tobacco abuse    Past Surgical History:  Procedure Laterality Date  . COLONOSCOPY WITH PROPOFOL N/A 06/20/2017   Procedure: COLONOSCOPY WITH PROPOFOL;  Surgeon: Wyline Mood, MD;  Location: [facility] ENDOSCOPY;  Service: Gastroenterology;  Laterality: N/A;  . COLONOSCOPY WITH PROPOFOL N/A 07/24/2017   Procedure: COLONOSCOPY WITH PROPOFOL;  Surgeon: Wyline Mood, MD;  Location[facility] ENDOSCOPY;  Service: Gastroenterology;  Laterality: N/A;  . ESOPHAGOGASTRODUODENOSCOPY (EGD) WITH PROPOFOL N/A 06/20/2017   Procedure: ESOPHAGOGASTRODUODENOSCOPY (EGD) WITH PROPOFOL;  Surgeon: Wyline Mood, MD;  Location: [facility] ENDOSCOPY;  Service: Gastroenterology;  Laterality: N/A;  . PERCUTANEOUS CORONARY STENT INTERVENTION (PCI-S)    . STOMACH SURGERY     2009 [facility] per chart review pt had AAA repair   . TONSILLECTOMY     Family History  Problem Relation Age of Onset  . Diabetes Mother   . Heart disease Mother   . Hypertension Mother   . Stroke Mother   . Prostate cancer Father   . Cancer Father        prostate cancer in 51s  . Diabetes Sister   . Heart disease Sister   . Ulcers  Other        unknown who had ulcers in family    Social History   Socioeconomic History  . Marital status: Married    Spouse name: Not on file  . Number of children: Not on file  . Years of education: Not on file  . Highest education level: Not on file  Occupational History  . Not on file  Social Needs  . Financial resource strain: Not hard at all  . Food insecurity:    Worry: Never true    Inability: Never true  . Transportation needs:    Medical: No    Non-medical: No  Tobacco Use  . Smoking status: Current Every Day Smoker    Packs/day: 0.25    Types: Cigarettes  . Smokeless  tobacco: Never Used  Substance and Sexual Activity  . Alcohol use: No    Alcohol/week: 0.0 oz    Comment: quit 1 year ago  . Drug use: No  . Sexual activity: Not Currently  Lifestyle  . Physical activity:    Days per week: Not on file    Minutes per session: Not on file  . Stress: Not on file  Relationships  . Social connections:    Talks on phone: Not on file    Gets together: Not on file    Attends religious service: Not on file    Active member of club or organization: Not on file    Attends meetings of clubs or organizations: Not on file    Relationship status: Not on file  Other Topics Concern  . Not on file  Social History Narrative   Disabled    4 kids 2 living    Used to do mill work    Smoker since age 70 max 2 pk per week now 1 ppd as of 02/2017     Outpatient Encounter Medications as of 11/05/2017  Medication Sig  . aspirin 81 MG tablet Take 81 mg by mouth daily.  Marland Kitchen atorvastatin (LIPITOR) 80 MG tablet Take 1 tablet (80 mg total) by mouth daily at 6 PM.  . baclofen (LIORESAL) 10 MG tablet Take 0.5 tablets (5 mg total) by mouth 2 (two) times daily.  . Cholecalciferol (VITAMIN D3) 1000 units CAPS Take by mouth.  . citalopram (CELEXA) 10 MG tablet Take 1 tablet (10 mg total) by mouth daily.  . clopidogrel (PLAVIX) 75 MG tablet TAKE ONE (1) TABLET EACH DAY  . [employer] CITRATE OF MAGNESIA SOLN TAKE 296 MLS (1 BOTTLE TOTAL) BY MOUTH ONCE FOR 1 DOSE.  . cyanocobalamin 100 MCG tablet Take 100 mcg by mouth daily.  . folic acid (FOLVITE) 1 MG tablet Take 1 mg by mouth daily.  Marland Kitchen gabapentin (NEURONTIN) 300 MG capsule   . ipratropium (ATROVENT) 0.02 % nebulizer solution Inhale into the lungs.  . isosorbide mononitrate (IMDUR) 30 MG 24 hr tablet Take 30 mg 2 (two) times daily by mouth.   Marland Kitchen lisinopril (PRINIVIL,ZESTRIL) 5 MG tablet TAKE ONE (1) TABLET EACH DAY  . Melatonin 3 MG TABS Take 3 mg by mouth.  . metoprolol succinate (TOPROL-XL) 25 MG 24 hr tablet   . metoprolol succinate  (TOPROL-XL) 25 MG 24 hr tablet Take 1 tablet (25 mg total) by mouth daily.  . mirtazapine (REMERON) 30 MG tablet   . mirtazapine (REMERON) 30 MG tablet 1 tablet nightly  . nitroGLYCERIN (NITROSTAT) 0.4 MG SL tablet Place 1 tablet (0.4 mg total) under the tongue every 5 (five) minutes as needed  for chest pain.  Marland Kitchen omeprazole (PRILOSEC) 40 MG capsule Take 1 capsule (40 mg total) by mouth daily.  Marland Kitchen oxyCODONE-acetaminophen (PERCOCET) 10-325 MG tablet   . pantoprazole (PROTONIX) 40 MG tablet Take 1 tablet (40 mg total) by mouth daily.  Marland Kitchen pyridoxine (B-6) 100 MG tablet Take 1 tablet (100 mg total) by mouth daily.  Marland Kitchen pyridOXINE (VITAMIN B-6) 100 MG tablet Take 1 tablet (100 mg total) by mouth daily.  Marland Kitchen thiamine 100 MG tablet Take by mouth.  Marland Kitchen ZOHYDRO ER 15 MG C12A   . zolpidem (AMBIEN) 10 MG tablet Take 1 tablet (10 mg total) by mouth at bedtime as needed for sleep.  Marland Kitchen linaclotide (LINZESS) 145 MCG CAPS capsule Take 1 capsule (145 mcg total) by mouth daily before breakfast.   No facility-administered encounter medications on file as of 11/05/2017.     Activities of Daily Living In your present state of health, do you have any difficulty performing the following activities: 11/05/2017  Hearing? N  Vision? N  Difficulty concentrating or making decisions? N  Walking or climbing stairs? N  Dressing or bathing? N  Doing errands, shopping? N  Preparing Food and eating ? N  Using the Toilet? N  In the past six months, have you accidently leaked urine? N  Do you have problems with loss of bowel control? N  Managing your Medications? N  Managing your Finances? N  Housekeeping or managing your Housekeeping? N  Some recent data might be hidden    Patient Care Team: McLean-Scocuzza, Pasty Spillers, MD as PCP - General (Internal Medicine)   Assessment:   This is a routine wellness examination for [place].  The goal of the wellness visit is to assist the patient how to close the gaps in care and create a  preventative care plan for the patient.   The roster of all physicians providing medical care to patient is listed in the Snapshot section of the chart.  Taking calcium VIT D as appropriate/Osteoporosis risk reviewed.    Safety issues reviewed; Smoke and carbon monoxide detectors in the home. No firearms in the home. Wears seatbelts when driving or riding with others. No violence in the home.  They do not have excessive sun exposure.  Discussed the need for sun protection: hats, long sleeves and the use of sunscreen if there is significant sun exposure.  Patient is alert, normal appearance, oriented to person/place/and time. Correctly identified the president of the [place] and recalls of 2/3 words.Performs simple calculations and can read correct time from watch face. Displays appropriate judgement.  No new identified risk were noted.  No failures at ADL's or IADLs.   BMI- discussed the importance of a healthy diet, water intake and the benefits of aerobic exercise. Educational material provided.   24 hour diet recall: Regular diet. 2 meals per day. Snacks throughout the day.  Ensure Enlive supplement provided to drink between meals.   Sleep patterns- Sleeps without issues.   TDAP vaccine deferred per patient preference.  Follow up with insurance.  Educational material provided.  Patient requests increase of imdur TID; currently taking BID. States he notices his heart flutter as the medication tapers off. Takes nitrostat with chest pain or when he feels his heart flutter to much. No pain or flutter today. Refill requested for nitrostat. States he sees his cardiologist x1 yearly but plans to contact him. Deferred to pcp for follow up.    Exercise Activities and Dietary recommendations Current Exercise Habits: Home exercise routine,  Type of exercise: walking, Time (Minutes): > 60, Frequency (Times/Week): 6, Weekly Exercise (Minutes/Week): 0, Intensity: Mild  Goals    . Gain 20 lbs      Eat 3 meals a day Ensure Enlive supplements as a meal or between meals; may try over ice cream.        Fall Risk Fall Risk  11/05/2017 11/02/2016 11/03/2015 09/24/2015 08/24/2015  Falls in the past year? No Yes No No No  Number falls in past yr: - 2 or more - - -  Injury with Fall? - Yes - - -  Risk Factor Category  - High Fall Risk - - -  Risk for fall due to : - - - - -  Follow up - Falls prevention discussed;Education provided - - -   Depression Screen PHQ 2/9 Scores 11/05/2017 11/02/2016 11/03/2015 09/24/2015  PHQ - 2 Score 0 0 0 0  PHQ- 9 Score - 0 - -    Cognitive Function MMSE - Mini Mental State Exam 11/03/2015  Orientation to time 5  Orientation to Place 5  Registration 3  Attention/ Calculation 5  Recall 3  Language- name 2 objects 2  Language- repeat 1  Language- follow 3 step command 3  Language- read & follow direction 1  Write a sentence 1  Copy design 1  Total score 30     6CIT Screen 11/05/2017 11/02/2016  What Year? 0 points 0 points  What month? 0 points 0 points  What time? 0 points 0 points  Count back from 20 0 points 0 points  Months in reverse 2 points 4 points  Repeat phrase 0 points 0 points  Total Score 2 4    Immunization History  Administered Date(s) Administered  . Hepatitis B, adult 03/08/2017, 05/22/2017  . Influenza-Unspecified 04/19/2012   Screening Tests Health Maintenance  Topic Date Due  . HIV Screening  01/02/1970  . TETANUS/TDAP  01/02/1974  . INFLUENZA VACCINE  11/08/2017  . COLONOSCOPY  07/25/2027  . Hepatitis C Screening  Completed      Plan:   End of life planning; Advance aging; Advanced directives discussed. Copy of current HCPOA/Living Will requested once completed.    I have personally reviewed and noted the following in the patient's chart:   . Medical and social history . Use of alcohol, tobacco or illicit drugs  . Current medications and supplements . Functional ability and status . Nutritional  status . Physical activity . Advanced directives . List of other physicians . Hospitalizations, surgeries, and ER visits in previous 12 months . Vitals . Screenings to include cognitive, depression, and falls . Referrals and appointments  In addition, I have reviewed and discussed with patient certain preventive protocols, quality metrics, and best practice recommendations. A written personalized care plan for preventive services as well as general preventive health recommendations were provided to patient.     Ashok PallOBrien-Blaney, Denisa L, LPN  4/09/81197/29/2019

## 2017-11-06 ENCOUNTER — Telehealth: Payer: Self-pay | Admitting: Internal Medicine

## 2017-11-06 NOTE — Telephone Encounter (Signed)
Patient requests increase of imdur TID; currently taking BID. States he notices his heart flutter as the medication tapers off. Takes nitrostat with chest pain or when he feels his heart flutter to much. No pain or flutter today. Refill requested for nitrostat. States he sees his cardiologist x1 yearly but plans to contact him. Deferred to pcp for follow up. Medications received via pill pack.    Cardiologist is  Jaclynn MajorStiber, Jonathan Andrew, MD  [address]  [facility] [id number]  [place], [place] [id number]  [phone]  [phone] (Fax)     Thank you this is cardiology medication  Will refer and get appt with cardiology for sx's asap  If worse go to ED     TMS

## 2017-11-08 NOTE — Telephone Encounter (Signed)
Below message faxed to Cardiologist Dr Maurine CaneStiber.  Left patient message for patient to call to inform him that he needs to follow up with cardiologist in regards to medication management. [facility]

## 2017-11-09 ENCOUNTER — Other Ambulatory Visit: Payer: Self-pay | Admitting: Internal Medicine

## 2017-11-09 DIAGNOSIS — R079 Chest pain, unspecified: Secondary | ICD-10-CM

## 2017-11-09 MED ORDER — NITROGLYCERIN 0.4 MG SL SUBL: 0.4000 mg | SUBLINGUAL_TABLET | SUBLINGUAL | 3 refills | Status: DC | PRN

## 2017-11-13 DIAGNOSIS — R200 Anesthesia of skin: Secondary | ICD-10-CM | POA: Diagnosis not present

## 2017-11-13 DIAGNOSIS — M79609 Pain in unspecified limb: Secondary | ICD-10-CM | POA: Diagnosis not present

## 2017-11-13 DIAGNOSIS — M545 Low back pain: Secondary | ICD-10-CM | POA: Diagnosis not present

## 2017-11-15 DIAGNOSIS — M47816 Spondylosis without myelopathy or radiculopathy, lumbar region: Secondary | ICD-10-CM | POA: Diagnosis not present

## 2017-11-15 DIAGNOSIS — Z79899 Other long term (current) drug therapy: Secondary | ICD-10-CM | POA: Diagnosis not present

## 2017-11-15 DIAGNOSIS — Z79891 Long term (current) use of opiate analgesic: Secondary | ICD-10-CM | POA: Diagnosis not present

## 2017-11-15 DIAGNOSIS — G894 Chronic pain syndrome: Secondary | ICD-10-CM | POA: Diagnosis not present

## 2017-11-15 DIAGNOSIS — M79609 Pain in unspecified limb: Secondary | ICD-10-CM | POA: Diagnosis not present

## 2017-12-05 ENCOUNTER — Other Ambulatory Visit: Payer: Self-pay | Admitting: Internal Medicine

## 2017-12-05 DIAGNOSIS — G4700 Insomnia, unspecified: Secondary | ICD-10-CM

## 2017-12-05 DIAGNOSIS — Z95810 Presence of automatic (implantable) cardiac defibrillator: Secondary | ICD-10-CM | POA: Diagnosis not present

## 2017-12-05 DIAGNOSIS — Z4502 Encounter for adjustment and management of automatic implantable cardiac defibrillator: Secondary | ICD-10-CM | POA: Diagnosis not present

## 2017-12-05 MED ORDER — ZOLPIDEM TARTRATE 10 MG PO TABS: 10.0000 mg | ORAL_TABLET | Freq: Every evening | ORAL | 2 refills | Status: DC | PRN

## 2017-12-12 ENCOUNTER — Other Ambulatory Visit: Payer: Self-pay | Admitting: Physician Assistant

## 2017-12-12 ENCOUNTER — Ambulatory Visit
Admission: RE | Admit: 2017-12-12 | Discharge: 2017-12-12 | Disposition: A | Discharge status: Home / Self Care | Payer: Medicare HMO | Source: Ambulatory Visit | Attending: Physician Assistant | Admitting: Physician Assistant

## 2017-12-12 DIAGNOSIS — M25551 Pain in right hip: Secondary | ICD-10-CM | POA: Diagnosis not present

## 2017-12-13 DIAGNOSIS — M79609 Pain in unspecified limb: Secondary | ICD-10-CM | POA: Diagnosis not present

## 2017-12-13 DIAGNOSIS — Z79899 Other long term (current) drug therapy: Secondary | ICD-10-CM | POA: Diagnosis not present

## 2017-12-13 DIAGNOSIS — M47816 Spondylosis without myelopathy or radiculopathy, lumbar region: Secondary | ICD-10-CM | POA: Diagnosis not present

## 2017-12-13 DIAGNOSIS — G894 Chronic pain syndrome: Secondary | ICD-10-CM | POA: Diagnosis not present

## 2017-12-13 DIAGNOSIS — Z79891 Long term (current) use of opiate analgesic: Secondary | ICD-10-CM | POA: Diagnosis not present

## 2017-12-27 ENCOUNTER — Other Ambulatory Visit: Payer: Self-pay | Admitting: Pain Medicine

## 2017-12-27 ENCOUNTER — Other Ambulatory Visit (HOSPITAL_COMMUNITY): Payer: Self-pay | Admitting: Pain Medicine

## 2017-12-27 DIAGNOSIS — M545 Low back pain: Secondary | ICD-10-CM

## 2018-01-02 ENCOUNTER — Other Ambulatory Visit: Payer: Self-pay | Admitting: Gastroenterology

## 2018-01-03 ENCOUNTER — Ambulatory Visit
Admission: RE | Admit: 2018-01-03 | Discharge: 2018-01-03 | Disposition: A | Discharge status: Home / Self Care | Payer: Medicare HMO | Source: Ambulatory Visit | Attending: Pain Medicine | Admitting: Pain Medicine

## 2018-01-03 DIAGNOSIS — M5126 Other intervertebral disc displacement, lumbar region: Secondary | ICD-10-CM | POA: Diagnosis not present

## 2018-01-03 DIAGNOSIS — M4807 Spinal stenosis, lumbosacral region: Secondary | ICD-10-CM | POA: Diagnosis not present

## 2018-01-03 DIAGNOSIS — M545 Low back pain: Secondary | ICD-10-CM | POA: Diagnosis not present

## 2018-01-10 DIAGNOSIS — M79609 Pain in unspecified limb: Secondary | ICD-10-CM | POA: Diagnosis not present

## 2018-01-10 DIAGNOSIS — M47816 Spondylosis without myelopathy or radiculopathy, lumbar region: Secondary | ICD-10-CM | POA: Diagnosis not present

## 2018-01-10 DIAGNOSIS — Z79891 Long term (current) use of opiate analgesic: Secondary | ICD-10-CM | POA: Diagnosis not present

## 2018-01-10 DIAGNOSIS — Z79899 Other long term (current) drug therapy: Secondary | ICD-10-CM | POA: Diagnosis not present

## 2018-01-10 DIAGNOSIS — G894 Chronic pain syndrome: Secondary | ICD-10-CM | POA: Diagnosis not present

## 2018-01-11 DIAGNOSIS — I2510 Atherosclerotic heart disease of native coronary artery without angina pectoris: Secondary | ICD-10-CM | POA: Diagnosis not present

## 2018-01-11 DIAGNOSIS — I5022 Chronic systolic (congestive) heart failure: Secondary | ICD-10-CM | POA: Diagnosis not present

## 2018-01-11 DIAGNOSIS — I255 Ischemic cardiomyopathy: Secondary | ICD-10-CM | POA: Diagnosis not present

## 2018-01-11 DIAGNOSIS — E785 Hyperlipidemia, unspecified: Secondary | ICD-10-CM | POA: Diagnosis not present

## 2018-01-11 DIAGNOSIS — I25118 Atherosclerotic heart disease of native coronary artery with other forms of angina pectoris: Secondary | ICD-10-CM | POA: Diagnosis not present

## 2018-01-11 DIAGNOSIS — I10 Essential (primary) hypertension: Secondary | ICD-10-CM | POA: Diagnosis not present

## 2018-01-11 DIAGNOSIS — I209 Angina pectoris, unspecified: Secondary | ICD-10-CM | POA: Diagnosis not present

## 2018-01-14 DIAGNOSIS — I25111 Atherosclerotic heart disease of native coronary artery with angina pectoris with documented spasm: Secondary | ICD-10-CM | POA: Diagnosis not present

## 2018-01-14 DIAGNOSIS — J439 Emphysema, unspecified: Secondary | ICD-10-CM | POA: Diagnosis not present

## 2018-01-14 DIAGNOSIS — I25118 Atherosclerotic heart disease of native coronary artery with other forms of angina pectoris: Secondary | ICD-10-CM | POA: Diagnosis not present

## 2018-01-14 DIAGNOSIS — I252 Old myocardial infarction: Secondary | ICD-10-CM | POA: Diagnosis not present

## 2018-01-14 DIAGNOSIS — Z955 Presence of coronary angioplasty implant and graft: Secondary | ICD-10-CM | POA: Diagnosis not present

## 2018-01-14 DIAGNOSIS — I509 Heart failure, unspecified: Secondary | ICD-10-CM | POA: Diagnosis not present

## 2018-01-14 DIAGNOSIS — E785 Hyperlipidemia, unspecified: Secondary | ICD-10-CM | POA: Diagnosis not present

## 2018-01-14 DIAGNOSIS — Z95810 Presence of automatic (implantable) cardiac defibrillator: Secondary | ICD-10-CM | POA: Diagnosis not present

## 2018-01-14 DIAGNOSIS — I110 Hypertensive heart disease with heart failure: Secondary | ICD-10-CM | POA: Diagnosis not present

## 2018-01-14 DIAGNOSIS — K219 Gastro-esophageal reflux disease without esophagitis: Secondary | ICD-10-CM | POA: Diagnosis not present

## 2018-01-14 DIAGNOSIS — I739 Peripheral vascular disease, unspecified: Secondary | ICD-10-CM | POA: Diagnosis not present

## 2018-01-14 DIAGNOSIS — I255 Ischemic cardiomyopathy: Secondary | ICD-10-CM | POA: Diagnosis not present

## 2018-01-15 ENCOUNTER — Ambulatory Visit (INDEPENDENT_AMBULATORY_CARE_PROVIDER_SITE_OTHER): Payer: Medicare HMO | Admitting: Internal Medicine

## 2018-01-15 ENCOUNTER — Encounter: Payer: Self-pay | Admitting: Internal Medicine

## 2018-01-15 VITALS — BP 108/66 | HR 76 | Temp 98.3°F | Ht 70.0 in | Wt 127.2 lb

## 2018-01-15 DIAGNOSIS — K581 Irritable bowel syndrome with constipation: Secondary | ICD-10-CM | POA: Diagnosis not present

## 2018-01-15 DIAGNOSIS — I25111 Atherosclerotic heart disease of native coronary artery with angina pectoris with documented spasm: Secondary | ICD-10-CM | POA: Diagnosis not present

## 2018-01-15 DIAGNOSIS — G8929 Other chronic pain: Secondary | ICD-10-CM | POA: Insufficient documentation

## 2018-01-15 DIAGNOSIS — R109 Unspecified abdominal pain: Secondary | ICD-10-CM | POA: Diagnosis not present

## 2018-01-15 DIAGNOSIS — H6123 Impacted cerumen, bilateral: Secondary | ICD-10-CM | POA: Diagnosis not present

## 2018-01-15 DIAGNOSIS — K2970 Gastritis, unspecified, without bleeding: Secondary | ICD-10-CM | POA: Insufficient documentation

## 2018-01-15 DIAGNOSIS — J439 Emphysema, unspecified: Secondary | ICD-10-CM | POA: Diagnosis not present

## 2018-01-15 DIAGNOSIS — K219 Gastro-esophageal reflux disease without esophagitis: Secondary | ICD-10-CM | POA: Diagnosis not present

## 2018-01-15 DIAGNOSIS — I252 Old myocardial infarction: Secondary | ICD-10-CM | POA: Diagnosis not present

## 2018-01-15 DIAGNOSIS — I255 Ischemic cardiomyopathy: Secondary | ICD-10-CM | POA: Diagnosis not present

## 2018-01-15 DIAGNOSIS — I2510 Atherosclerotic heart disease of native coronary artery without angina pectoris: Secondary | ICD-10-CM | POA: Diagnosis not present

## 2018-01-15 DIAGNOSIS — R634 Abnormal weight loss: Secondary | ICD-10-CM | POA: Diagnosis not present

## 2018-01-15 DIAGNOSIS — I509 Heart failure, unspecified: Secondary | ICD-10-CM | POA: Diagnosis not present

## 2018-01-15 DIAGNOSIS — M545 Low back pain: Secondary | ICD-10-CM | POA: Diagnosis not present

## 2018-01-15 DIAGNOSIS — E785 Hyperlipidemia, unspecified: Secondary | ICD-10-CM | POA: Diagnosis not present

## 2018-01-15 DIAGNOSIS — H6121 Impacted cerumen, right ear: Secondary | ICD-10-CM

## 2018-01-15 DIAGNOSIS — I739 Peripheral vascular disease, unspecified: Secondary | ICD-10-CM | POA: Diagnosis not present

## 2018-01-15 DIAGNOSIS — I110 Hypertensive heart disease with heart failure: Secondary | ICD-10-CM | POA: Diagnosis not present

## 2018-01-15 DIAGNOSIS — Z95810 Presence of automatic (implantable) cardiac defibrillator: Secondary | ICD-10-CM | POA: Diagnosis not present

## 2018-01-15 HISTORY — DX: Gastritis, unspecified, without bleeding: K29.70

## 2018-01-15 HISTORY — DX: Other chronic pain: G89.29

## 2018-01-15 MED ORDER — PANTOPRAZOLE SODIUM 40 MG PO TBEC: 40.0000 mg | DELAYED_RELEASE_TABLET | Freq: Every day | ORAL | 3 refills | Status: DC

## 2018-01-15 MED ORDER — CARBAMIDE PEROXIDE 6.5 % OT SOLN: 5.0000 [drp] | Freq: Two times a day (BID) | OTIC | 0 refills | Status: DC

## 2018-01-15 MED ORDER — PANTOPRAZOLE SODIUM 40 MG PO TBEC: 40.0000 mg | DELAYED_RELEASE_TABLET | Freq: Every day | ORAL | 0 refills | Status: DC

## 2018-01-15 NOTE — Progress Notes (Signed)
Pre visit review using our clinic review tool, if applicable. No additional management support is needed unless otherwise documented below in the visit note. 

## 2018-01-15 NOTE — Patient Instructions (Addendum)
Stop prilosec and start protonix 40 mg daily in am before food  Try 1 pill of linzess daily for constipation  Use debrox ear drops x 1 week    Heart Attack A heart attack (myocardial infarction, MI) is a condition that occurs when an artery in the heart (coronary artery) becomes narrowed or blocked. The narrowing or blockage cuts off the blood and oxygen supply to the heart, which permanently damages the heart. When one or more of your coronary arteries becomes blocked, that area of your heart begins to die. This causes symptoms felt during a heart attack. A heart attack is a medical emergency. If you think you are having a heart attack, do not wait to see if the symptoms will go away. Get medical help right away. What are the causes? Many conditions can cause a heart attack, including:  Atherosclerosis. This is when a fatty substance (plaque) gradually builds up in the arteries. This buildup can block or reduce blood supply to one or more of the coronary arteries. This is the most common cause of heart attack.  A blood clot. A blood clot can develop suddenly when plaque breaks up (ruptures) within a coronary artery. A blood clot can block the artery, which prevents blood flow to the heart.  Low blood pressure (hypotension).  An abnormal heart beat (arrhythmia).  Severe tightening (spasm) of a coronary artery. This cuts off blood flow through the artery.  Tearing of a coronary artery (spontaneous coronary artery dissection).  What increases the risk? The following factors may make you more likely to develop this condition:  Age. The older a person is the higher the risk.  A history of heart attack or stroke.  Being male.  A family history of chest pain, heart disease, or stroke.  Smoking.  Lack of exercise.  Being overweight or obese.  High blood pressure (hypertension).  High cholesterol.  Diabetes.  Stress.  Drinking too much alcohol.  Using drugs, such as cocaine  or methamphetamine.  What are the signs or symptoms? Symptoms of this condition may vary depending on factors like gender and age. Symptoms may include:  Chest pain. This may feel like crushing, squeezing, tightness, or pressure.  Pain in the arm, neck, jaw, back, or upper body.  Shortness of breath. This may be at rest or with minimal activity.  Heartburn or indigestion.  Nausea.  Sudden cold sweats.  Feeling tired.  Sudden light-headedness.  Older people may have more subtle symptoms, such as general tiredness or not feeling well. How is this diagnosed? This condition may be diagnosed through tests, such as:  Electrocardiogram (ECG) to measure the electrical activity of your heart.  Blood tests in which blood is drawn at scheduled times over several hours to check for the specific proteins or enzymes released by damaged heart muscle (cardiac markers).  Coronary angiogram. This is a procedure in which dye is injected into arteries and then X-rays are taken to evaluate blood flow and heart function.  CT scan of the chest, using dye to make it easy to see the heart.  Echocardiogram to evaluate heart motion and blood flow.  How is this treated? A heart attack must be treated as soon as possible. Treatment may include:  Oxygen therapy.  Medicines, such as: ? Medicine that breaks up or dissolves blood clots in the coronary artery (fibrinolytic therapy). ? Antiplatelet medicines and blood-thinning medicines, such as aspirin. These help prevent blood clots. ? Blood pressure medicines. ? Nitroglycerin. This medicine  improves blood flow to the heart. ? Pain medicine. ? Cholesterol-reducing medicine.  Angioplasty and cardiac stent placement. This is a procedure to widen a blocked coronary artery by placing a small piece of metal that looks like a coil or spring (stent) into the artery. The stent helps keep the artery open by supporting the artery walls.  Open heart surgery  (coronary artery bypass graft, CABG). In this procedure, a section of blood vessel from another part of the body (graft) is removed (harvested) and then inserted where it will allow blood to go around (bypass) the damaged part of the coronary artery.  Cardiac rehabilitation. This is a program that helps improve your health and well-being. It includes exercise training, education, and counseling to help you recover.  Follow these instructions at home: Medicines  Take over-the-counter and prescription medicines only as told by your health care provider. You may need to take medicine after a heart attack to: ? Keep your blood from clotting too easily. ? Control your blood pressure and heart rate. ? Lower your cholesterol. ? Control abnormal heart rhythms.  Do not take the following medicines unless your health care provider says it is okay to take them: ? NSAIDs, such as ibuprofen, naproxen, or celecoxib (COX-2 inhibitors). ? Vitamin supplements that contain vitamin A, vitamin E, or both. ? Hormone replacement therapy that contains estrogen with or without progestin. Lifestyle  Do not use any products that contain nicotine or tobacco, such as cigarettes and e-cigarettes. If you need help quitting, ask your health care provider.  Avoid secondhand smoke.  Exercise regularly. Ask your health care provider about participating in a cardiac rehabilitation program that helps you start exercising safely after a heart attack.  Eat a heart-healthy diet. Consider working with a diet and nutrition specialist (registered dietitian). A dietitian can help you learn healthy eating options.  Maintain a healthy weight.  Reduce your stress level.  Limit alcohol intake to no more than 1 drink a day for non-pregnant women and 2 drinks a day for men. One drink equals 12 oz. of beer, 5 oz. of wine, or 1 oz. of hard liquor. General instructions  Work with your health care provider to manage any other  conditions you have, such as hypertension or diabetes. These conditions affect your heart.  Get screened for depression, and seek treatment if needed.  Keep your vaccinations up to date. Get the flu (influenza) vaccine every year.  Keep all follow-up visits as told by your health care provider. This is important. Get help right away if:  You have sudden, unexplained discomfort in your chest, arms, back, neck, jaw, or upper body.  You have shortness of breath at any time.  You suddenly start to sweat or your skin gets clammy.  You feel nauseous or you vomit.  You suddenly feel light-headed or dizzy.  Your heart starts to beat fast or feels like it is skipping beats.  Your blood pressure is higher than 180/120.  You have thoughts about harming yourself or taking your own life. These symptoms may represent a serious problem that is an emergency. Do not wait to see if the symptoms will go away. Get medical help right away. Call your local emergency services (911 in the [place] Do not drive yourself to the hospital. Summary  A heart attack (myocardial infarction, MI) is a condition that occurs when an artery in the heart (coronary artery) becomes narrowed or blocked. The narrowing or blockage cuts off the blood and oxygen  supply to the heart, which permanently damages the heart.  A heart attack is an emergency. Get help right away if you have sudden pain in your chest, arms, back, jaw, or upper body. Seek help if you have nausea or you vomit, or you become lightheaded or dizzy.  Treatment is a combination of oxygen, medicines and procedures, if needed, to open the blocked artery and restore blood flow to the heart. This information is not intended to replace advice given to you by your health care provider. Make sure you discuss any questions you have with your health care provider. Document Released: 03/27/2005 Document Revised: 05/02/2016 Document Reviewed: 05/02/2016 Elsevier  Interactive Patient Education  [employer].

## 2018-01-15 NOTE — Progress Notes (Signed)
Chief Complaint  Patient presents with  . Follow-up   HFU with wife  1. 01/11/18 went to hospital for Chest pain EKG abnormal with LVH repolarization PVCs cath 01/14/18 with 70-80% RCA blockage and he has 2 stents placed and 1 post PCI mid RCA Dr. Maurine Cane is cardiologist. He has PTCA DES x 2  No current chest pain  2. C/o ear wax b/l ears nothing tried  3. C/o chronic abdominal pain and constipation is not taking linzess given by GI EGD +gastritis and colonoscopy had 2nd time 07/2017 due to f/u with GI  4. Chronic low back pain f/u with pain clinic    Review of Systems  Constitutional: Positive for weight loss.       Down 5 lbs    HENT: Negative for hearing loss.   Eyes: Negative for blurred vision.  Respiratory: Negative for shortness of breath.   Cardiovascular: Negative for chest pain.  Gastrointestinal: Positive for abdominal pain. Negative for vomiting.  Musculoskeletal: Positive for back pain.  Skin: Negative for rash.  Neurological: Negative for headaches.  Psychiatric/Behavioral: The patient is nervous/anxious.    Past Medical History:  Diagnosis Date  . Allergy   . CAD (coronary artery disease)   . Depression   . Frequent headaches   . GERD (gastroesophageal reflux disease)   . Headache   . Hyperlipidemia   . Hypertension   . PAD (peripheral artery disease) (HCC)   . Tobacco abuse    Past Surgical History:  Procedure Laterality Date  . COLONOSCOPY WITH PROPOFOL N/A 06/20/2017   Procedure: COLONOSCOPY WITH PROPOFOL;  Surgeon: Wyline Mood, MD;  Location: [facility] ENDOSCOPY;  Service: Gastroenterology;  Laterality: N/A;  . COLONOSCOPY WITH PROPOFOL N/A 07/24/2017   Procedure: COLONOSCOPY WITH PROPOFOL;  Surgeon: Wyline Mood, MD;  Location: [facility] ENDOSCOPY;  Service: Gastroenterology;  Laterality: N/A;  . ESOPHAGOGASTRODUODENOSCOPY (EGD) WITH PROPOFOL N/A 06/20/2017   Procedure: ESOPHAGOGASTRODUODENOSCOPY (EGD) WITH PROPOFOL;  Surgeon: Wyline Mood, MD;  Location: [facility]  ENDOSCOPY;  Service: Gastroenterology;  Laterality: N/A;  . PERCUTANEOUS CORONARY STENT INTERVENTION (PCI-S)    . STOMACH SURGERY     2009 [facility] per chart review pt had AAA repair   . TONSILLECTOMY     Family History  Problem Relation Age of Onset  . Diabetes Mother   . Heart disease Mother   . Hypertension Mother   . Stroke Mother   . Prostate cancer Father   . Cancer Father        prostate cancer in 39s  . Diabetes Sister   . Heart disease Sister   . Ulcers Other        unknown who had ulcers in family    Social History   Socioeconomic History  . Marital status: Married    Spouse name: Not on file  . Number of children: Not on file  . Years of education: Not on file  . Highest education level: Not on file  Occupational History  . Not on file  Social Needs  . Financial resource strain: Not hard at all  . Food insecurity:    Worry: Never true    Inability: Never true  . Transportation needs:    Medical: No    Non-medical: No  Tobacco Use  . Smoking status: Current Every Day Smoker    Packs/day: 0.25    Types: Cigarettes  . Smokeless tobacco: Never Used  Substance and Sexual Activity  . Alcohol use: No    Alcohol/week: 0.0 standard drinks  Comment: quit 1 year ago  . Drug use: No  . Sexual activity: Not Currently  Lifestyle  . Physical activity:    Days per week: Not on file    Minutes per session: Not on file  . Stress: Not on file  Relationships  . Social connections:    Talks on phone: Not on file    Gets together: Not on file    Attends religious service: Not on file    Active member of club or organization: Not on file    Attends meetings of clubs or organizations: Not on file    Relationship status: Not on file  . Intimate partner violence:    Fear of current or ex partner: No    Emotionally abused: No    Physically abused: No    Forced sexual activity: No  Other Topics Concern  . Not on file  Social History Narrative   Disabled    4 kids 2  living    Used to do mill work    Smoker since age 6 max 2 pk per week now 1 ppd as of 02/2017    Current Meds  Medication Sig  . [START ON 01/16/2018] amLODipine (NORVASC) 2.5 MG tablet Take by mouth.  Marland Kitchen aspirin 81 MG tablet Take 81 mg by mouth daily.  Marland Kitchen atorvastatin (LIPITOR) 80 MG tablet Take 1 tablet (80 mg total) by mouth daily at 6 PM.  . baclofen (LIORESAL) 10 MG tablet Take 0.5 tablets (5 mg total) by mouth 2 (two) times daily.  . Cholecalciferol (VITAMIN D3) 1000 units CAPS Take by mouth.  . citalopram (CELEXA) 10 MG tablet Take 1 tablet (10 mg total) by mouth daily.  . clopidogrel (PLAVIX) 75 MG tablet TAKE ONE (1) TABLET EACH DAY  . [employer] CITRATE OF MAGNESIA SOLN TAKE 296 MLS (1 BOTTLE TOTAL) BY MOUTH ONCE FOR 1 DOSE.  . cyanocobalamin 100 MCG tablet Take 100 mcg by mouth daily.  . folic acid (FOLVITE) 1 MG tablet Take 1 mg by mouth daily.  Marland Kitchen gabapentin (NEURONTIN) 300 MG capsule   . ipratropium (ATROVENT) 0.02 % nebulizer solution Inhale into the lungs.  . isosorbide mononitrate (IMDUR) 30 MG 24 hr tablet Take 30 mg 2 (two) times daily by mouth.   Marland Kitchen lisinopril (PRINIVIL,ZESTRIL) 5 MG tablet TAKE ONE (1) TABLET EACH DAY  . Melatonin 3 MG TABS Take 3 mg by mouth.  . metoprolol succinate (TOPROL-XL) 25 MG 24 hr tablet Take 1 tablet (25 mg total) by mouth daily.  . mirtazapine (REMERON) 30 MG tablet 1 tablet nightly  . nitroGLYCERIN (NITROSTAT) 0.4 MG SL tablet Place 1 tablet (0.4 mg total) under the tongue every 5 (five) minutes as needed for chest pain.  Marland Kitchen oxyCODONE-acetaminophen (PERCOCET) 10-325 MG tablet   . Polyethylene Glycol 3350 (PEG 3350) POWD Please take this medication 1 to 3 times daily so that you have bowel movements everyday.  . pyridoxine (B-6) 100 MG tablet Take 1 tablet (100 mg total) by mouth daily.  Marland Kitchen pyridOXINE (VITAMIN B-6) 100 MG tablet Take 1 tablet (100 mg total) by mouth daily.  Marland Kitchen thiamine 100 MG tablet Take by mouth.  Marland Kitchen ZOHYDRO ER 15 MG C12A   .  zolpidem (AMBIEN) 10 MG tablet Take 1 tablet (10 mg total) by mouth at bedtime as needed for sleep.  . [DISCONTINUED] omeprazole (PRILOSEC) 40 MG capsule Take 1 capsule (40 mg total) by mouth daily.   No Known Allergies No results found for this  or any previous visit (from the past 2160 hour(s)). Objective  Body mass index is 18.25 kg/m. Wt Readings from Last 3 Encounters:  01/15/18 127 lb 3.2 oz (57.7 kg)  11/05/17 125 lb 12.8 oz (57.1 kg)  08/01/17 132 lb 12.8 oz (60.2 kg)   Temp Readings from Last 3 Encounters:  01/15/18 98.3 F (36.8 C) (Oral)  11/05/17 98.2 F (36.8 C) (Oral)  07/24/17 (!) 97.1 F (36.2 C) (Tympanic)   BP Readings from Last 3 Encounters:  01/15/18 108/66  11/05/17 122/74  08/01/17 129/85   Pulse Readings from Last 3 Encounters:  01/15/18 76  11/05/17 79  08/01/17 78    Physical Exam  Constitutional: He is oriented to person, place, and time. Vital signs are normal. He appears well-developed and well-nourished. He is cooperative.  HENT:  Head: Normocephalic and atraumatic.  Mouth/Throat: Oropharynx is clear and moist and mucous membranes are normal.  B/l cerumen impaction right>left    Eyes: Pupils are equal, round, and reactive to light. Conjunctivae are normal.  Cardiovascular: Normal rate, regular rhythm and normal heart sounds.  Pulmonary/Chest: Effort normal and breath sounds normal.  Neurological: He is alert and oriented to person, place, and time. Gait normal.  Skin: Skin is warm, dry and intact.  Psychiatric: He has a normal mood and affect. His speech is normal and behavior is normal. Judgment and thought content normal. Cognition and memory are normal.  Nursing note and vitals reviewed.   Assessment   1. Chest pain CAD s/p DES x 2 mid RCA  2. Cerumen impaction b/l ears  3. Chronic abdominal pain, constipation and weight loss thought by GI to be IBSC didn't try linzess 145 mg qd, h/o gastritis  4. Chronic low back pain  5. HM Plan    1. F/u cardiology Dr. Daylene Katayama due 02/2018 to f/u  No active CP Cont plavix and aspirin  2. Removed ear was left ear with currette  Debrox drops right ear f/u 1 week RN visit to remove  3 .refer back to GI  Given Linzess samples 145 mg qd x 2 boxes  4. F/u pain clinic considering nerve block 5.  Will disc flu shot at f/u  Had 2/3 hep B vaccines Will disc Tdap and shingrix in future  PSA had 02/2017 normal  Colonoscopy 3 and 07/2017 had   Will need to see eye MD in future  Will need to do DRE in future    Provider: Dr. French Ana McLean-Scocuzza-Internal Medicine

## 2018-01-16 ENCOUNTER — Ambulatory Visit: Payer: Medicare HMO | Admitting: Gastroenterology

## 2018-01-23 ENCOUNTER — Encounter: Payer: Self-pay | Admitting: Internal Medicine

## 2018-01-23 ENCOUNTER — Ambulatory Visit (INDEPENDENT_AMBULATORY_CARE_PROVIDER_SITE_OTHER): Payer: Medicare HMO | Admitting: Internal Medicine

## 2018-01-23 VITALS — BP 118/82 | HR 64 | Temp 98.2°F | Ht 69.0 in | Wt 129.4 lb

## 2018-01-23 DIAGNOSIS — H6121 Impacted cerumen, right ear: Secondary | ICD-10-CM

## 2018-01-23 DIAGNOSIS — H6690 Otitis media, unspecified, unspecified ear: Secondary | ICD-10-CM | POA: Diagnosis not present

## 2018-01-23 MED ORDER — AMOXICILLIN-POT CLAVULANATE 875-125 MG PO TABS: 1.0000 | ORAL_TABLET | Freq: Two times a day (BID) | ORAL | 0 refills | Status: DC

## 2018-01-23 MED ORDER — NEOMYCIN-POLYMYXIN-HC 3.5-10000-1 OT SOLN: 4.0000 [drp] | Freq: Four times a day (QID) | OTIC | 0 refills | Status: DC

## 2018-01-23 NOTE — Patient Instructions (Signed)

## 2018-01-23 NOTE — Addendum Note (Signed)
Addended by: Quentin Ore on: 01/23/2018 05:36 PM   Modules accepted: Orders

## 2018-01-23 NOTE — Progress Notes (Signed)
Chief Complaint  Patient presents with  . Cerumen Impaction   F/u  1. Ear wax and right ear pain used debrox drops will clean wax today   Review of Systems  Constitutional: Negative for weight loss.  HENT: Positive for ear pain.        +right ear wax and pain   Respiratory: Negative for shortness of breath.   Cardiovascular: Negative for chest pain.   Past Medical History:  Diagnosis Date  . Allergy   . CAD (coronary artery disease)   . Depression   . Frequent headaches   . GERD (gastroesophageal reflux disease)   . Headache   . Hyperlipidemia   . Hypertension   . PAD (peripheral artery disease) (HCC)   . Tobacco abuse    Past Surgical History:  Procedure Laterality Date  . COLONOSCOPY WITH PROPOFOL N/A 06/20/2017   Procedure: COLONOSCOPY WITH PROPOFOL;  Surgeon: Wyline Mood, MD;  Location: [facility] ENDOSCOPY;  Service: Gastroenterology;  Laterality: N/A;  . COLONOSCOPY WITH PROPOFOL N/A 07/24/2017   Procedure: COLONOSCOPY WITH PROPOFOL;  Surgeon: Wyline Mood, MD;  Location: [facility] ENDOSCOPY;  Service: Gastroenterology;  Laterality: N/A;  . ESOPHAGOGASTRODUODENOSCOPY (EGD) WITH PROPOFOL N/A 06/20/2017   Procedure: ESOPHAGOGASTRODUODENOSCOPY (EGD) WITH PROPOFOL;  Surgeon: Wyline Mood, MD;  Location: [facility] ENDOSCOPY;  Service: Gastroenterology;  Laterality: N/A;  . PERCUTANEOUS CORONARY STENT INTERVENTION (PCI-S)    . STOMACH SURGERY     2009 [facility] per chart review pt had AAA repair   . TONSILLECTOMY     Family History  Problem Relation Age of Onset  . Diabetes Mother   . Heart disease Mother   . Hypertension Mother   . Stroke Mother   . Prostate cancer Father   . Cancer Father        prostate cancer in 5s  . Diabetes Sister   . Heart disease Sister   . Ulcers Other        unknown who had ulcers in family    Social History   Socioeconomic History  . Marital status: Married    Spouse name: Not on file  . Number of children: Not on file  . Years of education: Not on file   . Highest education level: Not on file  Occupational History  . Not on file  Social Needs  . Financial resource strain: Not hard at all  . Food insecurity:    Worry: Never true    Inability: Never true  . Transportation needs:    Medical: No    Non-medical: No  Tobacco Use  . Smoking status: Current Every Day Smoker    Packs/day: 0.25    Types: Cigarettes  . Smokeless tobacco: Never Used  Substance and Sexual Activity  . Alcohol use: No    Alcohol/week: 0.0 standard drinks    Comment: quit 1 year ago  . Drug use: No  . Sexual activity: Not Currently  Lifestyle  . Physical activity:    Days per week: Not on file    Minutes per session: Not on file  . Stress: Not on file  Relationships  . Social connections:    Talks on phone: Not on file    Gets together: Not on file    Attends religious service: Not on file    Active member of club or organization: Not on file    Attends meetings of clubs or organizations: Not on file    Relationship status: Not on file  . Intimate partner violence:  Fear of current or ex partner: No    Emotionally abused: No    Physically abused: No    Forced sexual activity: No  Other Topics Concern  . 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 amLODipine (NORVASC) 2.5 MG tablet Take 2.5 mg by mouth daily.   Marland Kitchen aspirin 81 MG tablet Take 81 mg by mouth daily.  Marland Kitchen atorvastatin (LIPITOR) 80 MG tablet Take 1 tablet (80 mg total) by mouth daily at 6 PM.  . baclofen (LIORESAL) 10 MG tablet Take 0.5 tablets (5 mg total) by mouth 2 (two) times daily.  . carbamide peroxide (DEBROX) 6.5 % OTIC solution Place 5 drops into the right ear 2 (two) times daily. X 1 week  . Cholecalciferol (VITAMIN D3) 1000 units CAPS Take by mouth.  . citalopram (CELEXA) 10 MG tablet Take 1 tablet (10 mg total) by  mouth daily.  . clopidogrel (PLAVIX) 75 MG tablet TAKE ONE (1) TABLET EACH DAY  . [employer] CITRATE OF MAGNESIA SOLN TAKE 296 MLS (1 BOTTLE TOTAL) BY MOUTH ONCE FOR 1 DOSE.  . cyanocobalamin 100 MCG tablet Take 100 mcg by mouth daily.  . folic acid (FOLVITE) 1 MG tablet Take 1 mg by mouth daily.  Marland Kitchen gabapentin (NEURONTIN) 300 MG capsule   . ipratropium (ATROVENT) 0.02 % nebulizer solution Inhale into the lungs.  . isosorbide mononitrate (IMDUR) 30 MG 24 hr tablet Take 30 mg by mouth 4 (four) times daily.   Marland Kitchen lisinopril (PRINIVIL,ZESTRIL) 5 MG tablet TAKE ONE (1) TABLET EACH DAY  . Melatonin 3 MG TABS Take 3 mg by mouth.  . metoprolol succinate (TOPROL-XL) 25 MG 24 hr tablet Take 1 tablet (25 mg total) by mouth daily.  . mirtazapine (REMERON) 30 MG tablet 1 tablet nightly  . nitroGLYCERIN (NITROSTAT) 0.4 MG SL tablet Place 1 tablet (0.4 mg total) under the tongue every 5 (five) minutes as needed for chest pain.  Marland Kitchen oxyCODONE-acetaminophen (PERCOCET) 10-325 MG tablet   . pantoprazole (PROTONIX) 40 MG tablet Take 1 tablet (40 mg total) by mouth daily. 30 minutes before food  . pantoprazole (PROTONIX) 40 MG tablet Take 1 tablet (40 mg total) by mouth daily. 30 minutes before meal  . Polyethylene Glycol 3350 (PEG 3350) POWD Please take this medication 1 to 3 times daily so that you have bowel movements everyday.  . pyridoxine (B-6) 100 MG tablet Take 1 tablet (100 mg total) by mouth daily.  Marland Kitchen pyridOXINE (VITAMIN B-6) 100 MG tablet Take 1 tablet (100 mg total) by mouth daily.  Marland Kitchen thiamine 100 MG tablet Take by mouth.  Marland Kitchen ZOHYDRO ER 15 MG C12A   . zolpidem (AMBIEN) 10 MG tablet Take 1 tablet (10 mg total) by mouth at bedtime as needed for sleep.   No Known Allergies No results found for this or any previous visit (from the past 2160 hour(s)). Objective  Body mass index is 18.56 kg/m. Wt Readings from Last 3 Encounters:  01/23/18 129 lb 6 oz (58.7 kg)  01/15/18 127 lb 3.2 oz (57.7 kg)  11/05/17 125 lb  12.8 oz (57.1 kg)   Temp Readings from Last 3 Encounters:  01/23/18 98.2 F (36.8 C) (Oral)  01/15/18 98.3 F (36.8  C) (Oral)  11/05/17 98.2 F (36.8 C) (Oral)   BP Readings from Last 3 Encounters:  01/23/18 118/82  01/15/18 108/66  11/05/17 122/74   Pulse Readings from Last 3 Encounters:  01/23/18 64  01/15/18 76  11/05/17 79    Physical Exam  Constitutional: He is oriented to person, place, and time. Vital signs are normal. He appears well-developed and well-nourished. He is cooperative.  HENT:  Head: Normocephalic and atraumatic.  Mouth/Throat: Oropharynx is clear and moist and mucous membranes are normal.  Cerumen impaction right ear and bleeding with yellow and bloody drainage and pain    Eyes: Pupils are equal, round, and reactive to light. Conjunctivae are normal.  Cardiovascular: Normal rate, regular rhythm and normal heart sounds.  Pulmonary/Chest: Effort normal and breath sounds normal.  Neurological: He is alert and oriented to person, place, and time. Gait normal.  Skin: Skin is warm, dry and intact.  Psychiatric: He has a normal mood and affect. His speech is normal and behavior is normal. Judgment and thought content normal. Cognition and memory are normal.  Nursing note and vitals reviewed.   Assessment   1. Cerumen impaction, right with c/w otitis media Plan   1. Cleaned b/l ears with currette and Hydrogen peroxide flush  Tx with Augmentin bid x 10 days and Neomycin drops Call back Friday if not better otherwise f/u as sch   Declines lfu shot today   Provider: Dr. French Ana McLean-Scocuzza-Internal Medicine

## 2018-02-06 ENCOUNTER — Other Ambulatory Visit: Payer: Self-pay | Admitting: Internal Medicine

## 2018-02-06 DIAGNOSIS — K2970 Gastritis, unspecified, without bleeding: Secondary | ICD-10-CM

## 2018-02-06 MED ORDER — PANTOPRAZOLE SODIUM 40 MG PO TBEC: 40.0000 mg | DELAYED_RELEASE_TABLET | Freq: Every day | ORAL | 3 refills | Status: DC

## 2018-02-11 DIAGNOSIS — M25551 Pain in right hip: Secondary | ICD-10-CM | POA: Diagnosis not present

## 2018-02-11 DIAGNOSIS — M5136 Other intervertebral disc degeneration, lumbar region: Secondary | ICD-10-CM | POA: Diagnosis not present

## 2018-02-11 DIAGNOSIS — Z79891 Long term (current) use of opiate analgesic: Secondary | ICD-10-CM | POA: Diagnosis not present

## 2018-02-11 DIAGNOSIS — M545 Low back pain: Secondary | ICD-10-CM | POA: Diagnosis not present

## 2018-02-11 DIAGNOSIS — G894 Chronic pain syndrome: Secondary | ICD-10-CM | POA: Diagnosis not present

## 2018-02-11 DIAGNOSIS — Z79899 Other long term (current) drug therapy: Secondary | ICD-10-CM | POA: Diagnosis not present

## 2018-02-11 DIAGNOSIS — M47816 Spondylosis without myelopathy or radiculopathy, lumbar region: Secondary | ICD-10-CM | POA: Diagnosis not present

## 2018-02-11 DIAGNOSIS — M47817 Spondylosis without myelopathy or radiculopathy, lumbosacral region: Secondary | ICD-10-CM | POA: Diagnosis not present

## 2018-02-12 DIAGNOSIS — I5022 Chronic systolic (congestive) heart failure: Secondary | ICD-10-CM | POA: Diagnosis not present

## 2018-02-12 DIAGNOSIS — I2510 Atherosclerotic heart disease of native coronary artery without angina pectoris: Secondary | ICD-10-CM | POA: Diagnosis not present

## 2018-02-12 DIAGNOSIS — I10 Essential (primary) hypertension: Secondary | ICD-10-CM | POA: Diagnosis not present

## 2018-02-12 DIAGNOSIS — I255 Ischemic cardiomyopathy: Secondary | ICD-10-CM | POA: Diagnosis not present

## 2018-02-12 DIAGNOSIS — E785 Hyperlipidemia, unspecified: Secondary | ICD-10-CM | POA: Diagnosis not present

## 2018-02-20 ENCOUNTER — Other Ambulatory Visit: Payer: Self-pay | Admitting: Internal Medicine

## 2018-02-20 DIAGNOSIS — F32A Depression, unspecified: Secondary | ICD-10-CM

## 2018-02-20 DIAGNOSIS — F329 Major depressive disorder, single episode, unspecified: Secondary | ICD-10-CM

## 2018-02-20 DIAGNOSIS — G4700 Insomnia, unspecified: Secondary | ICD-10-CM

## 2018-02-20 DIAGNOSIS — I2510 Atherosclerotic heart disease of native coronary artery without angina pectoris: Secondary | ICD-10-CM

## 2018-02-20 MED ORDER — LISINOPRIL 5 MG PO TABS: ORAL_TABLET | ORAL | 3 refills | Status: DC

## 2018-02-20 MED ORDER — METOPROLOL SUCCINATE ER 25 MG PO TB24: 25.0000 mg | ORAL_TABLET | Freq: Every day | ORAL | 3 refills | Status: DC

## 2018-02-20 MED ORDER — ATORVASTATIN CALCIUM 80 MG PO TABS: 80.0000 mg | ORAL_TABLET | Freq: Every day | ORAL | 3 refills | Status: DC

## 2018-02-20 MED ORDER — MIRTAZAPINE 30 MG PO TABS: ORAL_TABLET | ORAL | 3 refills | Status: DC

## 2018-02-27 ENCOUNTER — Other Ambulatory Visit: Payer: Self-pay | Admitting: Internal Medicine

## 2018-02-27 ENCOUNTER — Encounter: Payer: Self-pay | Admitting: Internal Medicine

## 2018-02-27 ENCOUNTER — Ambulatory Visit (INDEPENDENT_AMBULATORY_CARE_PROVIDER_SITE_OTHER): Payer: Medicare HMO | Admitting: Internal Medicine

## 2018-02-27 VITALS — BP 122/72 | HR 66 | Temp 98.1°F | Ht 69.0 in | Wt 125.0 lb

## 2018-02-27 DIAGNOSIS — Z1329 Encounter for screening for other suspected endocrine disorder: Secondary | ICD-10-CM | POA: Diagnosis not present

## 2018-02-27 DIAGNOSIS — M1990 Unspecified osteoarthritis, unspecified site: Secondary | ICD-10-CM | POA: Diagnosis not present

## 2018-02-27 DIAGNOSIS — R319 Hematuria, unspecified: Secondary | ICD-10-CM | POA: Diagnosis not present

## 2018-02-27 DIAGNOSIS — N529 Male erectile dysfunction, unspecified: Secondary | ICD-10-CM

## 2018-02-27 DIAGNOSIS — H6991 Unspecified Eustachian tube disorder, right ear: Secondary | ICD-10-CM | POA: Diagnosis not present

## 2018-02-27 DIAGNOSIS — I2510 Atherosclerotic heart disease of native coronary artery without angina pectoris: Secondary | ICD-10-CM | POA: Diagnosis not present

## 2018-02-27 DIAGNOSIS — E559 Vitamin D deficiency, unspecified: Secondary | ICD-10-CM

## 2018-02-27 DIAGNOSIS — Z125 Encounter for screening for malignant neoplasm of prostate: Secondary | ICD-10-CM

## 2018-02-27 HISTORY — DX: Unspecified eustachian tube disorder, right ear: H69.91

## 2018-02-27 LAB — VITAMIN D 25 HYDROXY (VIT D DEFICIENCY, FRACTURES): VITD: 18.49 ng/mL — ABNORMAL LOW (ref 30.00–100.00)

## 2018-02-27 LAB — PSA: PSA: 1.67 ng/mL (ref 0.10–4.00)

## 2018-02-27 LAB — TSH: TSH: 1.15 u[IU]/mL (ref 0.35–4.50)

## 2018-02-27 MED ORDER — CHOLECALCIFEROL 1.25 MG (50000 UT) PO CAPS: 50000.0000 [IU] | ORAL_CAPSULE | ORAL | 1 refills | Status: DC

## 2018-02-27 MED ORDER — SILDENAFIL CITRATE 20 MG PO TABS: ORAL_TABLET | ORAL | 11 refills | Status: DC

## 2018-02-27 MED ORDER — FLUTICASONE PROPIONATE 50 MCG/ACT NA SUSP: 1.0000 | Freq: Every day | NASAL | 11 refills | Status: DC

## 2018-02-27 MED ORDER — CETIRIZINE HCL 10 MG PO TABS: 10.0000 mg | ORAL_TABLET | Freq: Every day | ORAL | 3 refills | Status: DC

## 2018-02-27 NOTE — Progress Notes (Signed)
He has appt 03/05/18 with GI   Thanks TMS

## 2018-02-27 NOTE — Patient Instructions (Addendum)
Call [facility] pharmacy and given them your information  Pick up nose spray and [facility] pill from [facility] call back in 2-4 weeks if this is not helping and we will do a CT scan of your sinuses and ears  Follow up in 3 months sooner if needed   Sildenafil tablets (Viagra) What is this medicine? SILDENAFIL (sil DEN a fil) is used to treat erection problems in men. This medicine may be used for other purposes; ask your health care provider or pharmacist if you have questions. COMMON BRAND NAME(S): Viagra What should I tell my health care provider before I take this medicine? They need to know if you have any of these conditions: -bleeding disorders -eye or vision problems, including a rare inherited eye disease called retinitis pigmentosa -anatomical deformation of the penis, Peyronie's disease, or history of priapism (painful and prolonged erection) -heart disease, angina, a history of heart attack, irregular heart beats, or other heart problems -high or low blood pressure -history of blood diseases, like sickle cell anemia or leukemia -history of stomach bleeding -kidney disease -liver disease -stroke -an unusual or allergic reaction to sildenafil, other medicines, foods, dyes, or preservatives -pregnant or trying to get pregnant -breast-feeding How should I use this medicine? Take this medicine by mouth with a glass of water. Follow the directions on the prescription label. The dose is usually taken 1 hour before sexual activity. You should not take the dose more than once per day. Do not take your medicine more often than directed. Talk to your pediatrician regarding the use of this medicine in children. This medicine is not used in children for this condition. Overdosage: If you think you have taken too much of this medicine contact a poison control center or emergency room at once. NOTE: This medicine is only for you. Do not share this medicine with others. What if I miss a dose? This does  not apply. Do not take double or extra doses. What may interact with this medicine? Do not take this medicine with any of the following medications: -cisapride -nitrates like amyl nitrite, isosorbide dinitrate, isosorbide mononitrate, nitroglycerin -riociguat This medicine may also interact with the following medications: -antiviral medicines for HIV or AIDS -bosentan -certain medicines for benign prostatic hyperplasia (BPH) -certain medicines for blood pressure -certain medicines for fungal infections like ketoconazole and itraconazole -cimetidine -erythromycin -rifampin This list may not describe all possible interactions. Give your health care provider a list of all the medicines, herbs, non-prescription drugs, or dietary supplements you use. Also tell them if you smoke, drink alcohol, or use illegal drugs. Some items may interact with your medicine. What should I watch for while using this medicine? If you notice any changes in your vision while taking this drug, call your doctor or health care professional as soon as possible. Stop using this medicine and call your health care provider right away if you have a loss of sight in one or both eyes. Contact your doctor or health care professional right away if you have an erection that lasts longer than 4 hours or if it becomes painful. This may be a sign of a serious problem and must be treated right away to prevent permanent damage. If you experience symptoms of nausea, dizziness, chest pain or arm pain upon initiation of sexual activity after taking this medicine, you should refrain from further activity and call your doctor or health care professional as soon as possible. Do not drink alcohol to excess (examples, 5 glasses of wine  or 5 shots of whiskey) when taking this medicine. When taken in excess, alcohol can increase your chances of getting a headache or getting dizzy, increasing your heart rate or lowering your blood pressure. Using  this medicine does not protect you or your partner against HIV infection (the virus that causes AIDS) or other sexually transmitted diseases. What side effects may I notice from receiving this medicine? Side effects that you should report to your doctor or health care professional as soon as possible: -allergic reactions like skin rash, itching or hives, swelling of the face, lips, or tongue -breathing problems -changes in hearing -changes in vision -chest pain -fast, irregular heartbeat -prolonged or painful erection -seizures Side effects that usually do not require medical attention (report to your doctor or health care professional if they continue or are bothersome): -back pain -dizziness -flushing -headache -indigestion -muscle aches -nausea -stuffy or runny nose This list may not describe all possible side effects. Call your doctor for medical advice about side effects. You may report side effects to FDA at 1-800-FDA-1088. Where should I keep my medicine? Keep out of reach of children. Store at room temperature between 15 and 30 degrees C (59 and 86 degrees F). Throw away any unused medicine after the expiration date. NOTE: This sheet is a summary. It may not cover all possible information. If you have questions about this medicine, talk to your doctor, pharmacist, or health care provider.  2018 Elsevier/Gold Standard (2015-03-10 12:00:25)  Erectile Dysfunction Erectile dysfunction (ED) is the inability to get or keep an erection in order to have sexual intercourse. Erectile dysfunction may include:  Inability to get an erection.  Lack of enough hardness of the erection to allow penetration.  Loss of the erection before sex is finished.  What are the causes? This condition may be caused by:  Certain medicines, such as: ? Pain relievers. ? Antihistamines. ? Antidepressants. ? Blood pressure medicines. ? Water pills (diuretics). ? Ulcer medicines. ? Muscle  relaxants. ? Drugs.  Excessive drinking.  Psychological causes, such as: ? Anxiety. ? Depression. ? Sadness. ? Exhaustion. ? Performance fear. ? Stress.  Physical causes, such as: ? Artery problems. This may include diabetes, smoking, liver disease, or atherosclerosis. ? High blood pressure. ? Hormonal problems, such as low testosterone. ? Obesity. ? Nerve problems. This may include back or pelvic injuries, diabetes mellitus, multiple sclerosis, or Parkinson disease.  What are the signs or symptoms? Symptoms of this condition include:  Inability to get an erection.  Lack of enough hardness of the erection to allow penetration.  Loss of the erection before sex is finished.  Normal erections at some times, but with frequent unsatisfactory episodes.  Low sexual satisfaction in either partner due to erection problems.  A curved penis occurring with erection. The curve may cause pain or the penis may be too curved to allow for intercourse.  Never having nighttime erections.  How is this diagnosed? This condition is often diagnosed by:  Performing a physical exam to find other diseases or specific problems with the penis.  Asking you detailed questions about the problem.  Performing blood tests to check for diabetes mellitus or to measure hormone levels.  Performing other tests to check for underlying health conditions.  Performing an ultrasound exam to check for scarring.  Performing a test to check blood flow to the penis.  Doing a sleep study at home to measure nighttime erections.  How is this treated? This condition may be treated by:  Medicine taken by mouth to help you achieve an erection (oral medicine).  Hormone replacement therapy to replace low testosterone levels.  Medicine that is injected into the penis. Your health care provider may instruct you how to give yourself these injections at home.  Vacuum pump. This is a pump with a ring on it. The  pump and ring are placed on the penis and used to create pressure that helps the penis become erect.  Penile implant surgery. In this procedure, you may receive: ? An inflatable implant. This consists of cylinders, a pump, and a reservoir. The cylinders can be inflated with a fluid that helps to create an erection, and they can be deflated after intercourse. ? A semi-rigid implant. This consists of two silicone rubber rods. The rods provide some rigidity. They are also flexible, so the penis can both curve downward in its normal position and become straight for sexual intercourse.  Blood vessel surgery, to improve blood flow to the penis. During this procedure, a blood vessel from a different part of the body is placed into the penis to allow blood to flow around (bypass) damaged or blocked blood vessels.  Lifestyle changes, such as exercising more, losing weight, and quitting smoking.  Follow these instructions at home: Medicines  Take over-the-counter and prescription medicines only as told by your health care provider. Do not increase the dosage without first discussing it with your health care provider.  If you are using self-injections, perform injections as directed by your health care provider. Make sure to avoid any veins that are on the surface of the penis. After giving an injection, apply pressure to the injection site for 5 minutes. General instructions  Exercise regularly, as directed by your health care provider. Work with your health care provider to lose weight, if needed.  Do not use any products that contain nicotine or tobacco, such as cigarettes and e-cigarettes. If you need help quitting, ask your health care provider.  Before using a vacuum pump, read the instructions that come with the pump and discuss any questions with your health care provider.  Keep all follow-up visits as told by your health care provider. This is important. Contact a health care provider  if:  You feel nauseous.  You vomit. Get help right away if:  You are taking oral or injectable medicines and you have an erection that lasts longer than 4 hours. If your health care provider is unavailable, go to the nearest emergency room for evaluation. An erection that lasts much longer than 4 hours can result in permanent damage to your penis.  You have severe pain in your groin or abdomen.  You develop redness or severe swelling of your penis.  You have redness spreading up into your groin or lower abdomen.  You are unable to urinate.  You experience chest pain or a rapid heart beat (palpitations) after taking oral medicines. Summary  Erectile dysfunction (ED) is the inability to get or keep an erection during sexual intercourse. This problem can usually be treated successfully.  This condition is diagnosed based on a physical exam, your symptoms, and tests to determine the cause. Treatment varies depending on the cause, and may include medicines, hormone therapy, surgery, or vacuum pump.  You may need follow-up visits to make sure that you are using your medicines or devices correctly.  Get help right away if you are taking or injecting medicines and you have an erection that lasts longer than 4 hours. This information is  not intended to replace advice given to you by your health care provider. Make sure you discuss any questions you have with your health care provider. Document Released: 03/24/2000 Document Revised: 04/12/2016 Document Reviewed: 04/12/2016 Elsevier Interactive Patient Education  2017 [employer].

## 2018-02-27 NOTE — Progress Notes (Signed)
Pre visit review using our clinic review tool, if applicable. No additional management support is needed unless otherwise documented below in the visit note. 

## 2018-02-27 NOTE — Progress Notes (Signed)
Chief Complaint  Patient presents with  . Follow-up   F/u  1. HTN controlled on norvsac 2.5, lis 5 mg and toprol XL 25 mg qd  2. CAD s/p PCI no chest pain today now on imdur 60 mg bid per cardiology  3. C/o right ear feeling stopped up after augmentin use he does have allergies but does not take any medication and did not use ear drops due to cost  4. ED x 8 months and wants medication for erection  5. 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  HENT:       Right ear stopped up   Eyes: Negative for blurred vision.  Respiratory: Negative for shortness of breath.   Cardiovascular: Negative for chest pain.  Gastrointestinal: Negative for abdominal pain.  Genitourinary:       +ED  Musculoskeletal: Positive for joint pain.  Skin: Negative for rash.  Neurological: Negative for headaches.  Psychiatric/Behavioral: Negative for depression.   Past Medical History:  Diagnosis Date  . Allergy   . CAD (coronary artery disease)   . Depression   . Frequent headaches   . GERD (gastroesophageal reflux disease)   . Headache   . Hyperlipidemia   . Hypertension   . PAD (peripheral artery disease) (HCC)   . Tobacco abuse    Past Surgical History:  Procedure Laterality Date  . COLONOSCOPY WITH PROPOFOL N/A 06/20/2017   Procedure: COLONOSCOPY WITH PROPOFOL;  Surgeon: Wyline MoodAnna, Kiran, MD;  [facility];  Service: Gastroenterology;  Laterality: N/A;  . COLONOSCOPY WITH PROPOFOL N/A 07/24/2017   Procedure: COLONOSCOPY WITH PROPOFOL;  Surgeon: Wyline MoodAnna, Kiran, MD;  [facility];  Service: Gastroenterology;  Laterality: N/A;  . ESOPHAGOGASTRODUODENOSCOPY (EGD) WITH PROPOFOL N/A 06/20/2017   Procedure: ESOPHAGOGASTRODUODENOSCOPY (EGD) WITH PROPOFOL;  Surgeon: Wyline MoodAnna, Kiran, MD;  [facility];  Service: Gastroenterology;  Laterality: N/A;  . PERCUTANEOUS CORONARY STENT INTERVENTION (PCI-S)    . STOMACH  SURGERY     [facility] review pt had AAA repair   . TONSILLECTOMY     Family History  Problem Relation Age of Onset  . Diabetes Mother   . Heart disease Mother   . Hypertension Mother   . Stroke Mother   . Prostate cancer Father   . Cancer Father        prostate cancer in 9050s  . Diabetes Sister   . Heart disease Sister   . Ulcers Other        unknown who had ulcers in family    Social History   Socioeconomic History  . Marital status: Married    Spouse name: Not on file  . Number of children: Not on file  . Years of education: Not on file  . Highest education level: Not on file  Occupational History  . Not on file  Social Needs  . Financial resource strain: Not hard at all  . Food insecurity:    Worry: Never true    Inability: Never true  . Transportation needs:    Medical: No    Non-medical: No  Tobacco Use  . Smoking status: Current Every Day Smoker    Packs/day: 0.25    Types: Cigarettes  . Smokeless tobacco: Never Used  Substance and Sexual Activity  . Alcohol use: No    Alcohol/week: 0.0 standard drinks    Comment: quit 1 year ago  . Drug use: No  .  Sexual activity: Not Currently  Lifestyle  . Physical activity:    Days per week: Not on file    Minutes per session: Not on file  . Stress: Not on file  Relationships  . Social connections:    Talks on phone: Not on file    Gets together: Not on file    Attends religious service: Not on file    Active member of club or organization: Not on file    Attends meetings of clubs or organizations: Not on file    Relationship status: Not on file  . Intimate partner violence:    Fear of current or ex partner: No    Emotionally abused: No    Physically abused: No    Forced sexual activity: No  Other Topics Concern  . Not on file  Social History Narrative   Disabled    4 kids 2 living    Used to do mill work    Smoker since age 69 max 2 pk per week now 1 ppd as of 02/2017    Married    Current  Meds  Medication Sig  . amLODipine (NORVASC) 2.5 MG tablet Take 2.5 mg by mouth daily.   Marland Kitchen aspirin 81 MG tablet Take 81 mg by mouth daily.  Marland Kitchen atorvastatin (LIPITOR) 80 MG tablet Take 1 tablet (80 mg total) by mouth daily at 6 PM.  . baclofen (LIORESAL) 10 MG tablet Take 0.5 tablets (5 mg total) by mouth 2 (two) times daily.  . carbamide peroxide (DEBROX) 6.5 % OTIC solution Place 5 drops into the right ear 2 (two) times daily. X 1 week  . Cholecalciferol (VITAMIN D3) 1000 units CAPS Take by mouth.  . citalopram (CELEXA) 10 MG tablet Take 1 tablet (10 mg total) by mouth daily.  . clopidogrel (PLAVIX) 75 MG tablet TAKE ONE (1) TABLET EACH DAY  . [facility] CITRATE OF MAGNESIA SOLN TAKE 296 MLS (1 BOTTLE TOTAL) BY MOUTH ONCE FOR 1 DOSE.  . cyanocobalamin 100 MCG tablet Take 100 mcg by mouth daily.  . folic acid (FOLVITE) 1 MG tablet Take 1 mg by mouth daily.  Marland Kitchen gabapentin (NEURONTIN) 300 MG capsule   . ipratropium (ATROVENT) 0.02 % nebulizer solution Inhale into the lungs.  . isosorbide mononitrate (IMDUR) 30 MG 24 hr tablet Take 60 mg by mouth 2 (two) times daily.   Marland Kitchen lisinopril (PRINIVIL,ZESTRIL) 5 MG tablet 1 pill daily  . Melatonin 3 MG TABS Take 3 mg by mouth.  . metoprolol succinate (TOPROL-XL) 25 MG 24 hr tablet Take 1 tablet (25 mg total) by mouth daily.  . mirtazapine (REMERON) 30 MG tablet 1 pill nightly  . neomycin-polymyxin-hydrocortisone (CORTISPORIN) OTIC solution Place 4 drops into the right ear 4 (four) times daily. X 7-10 days  . nitroGLYCERIN (NITROSTAT) 0.4 MG SL tablet Place 1 tablet (0.4 mg total) under the tongue every 5 (five) minutes as needed for chest pain.  Marland Kitchen oxyCODONE-acetaminophen (PERCOCET) 10-325 MG tablet   . pantoprazole (PROTONIX) 40 MG tablet Take 1 tablet (40 mg total) by mouth daily. 30 minutes before food  . Polyethylene Glycol 3350 (PEG 3350) POWD Please take this medication 1 to 3 times daily so that you have bowel movements everyday.  . pyridOXINE (VITAMIN B-6)  100 MG tablet Take 1 tablet (100 mg total) by mouth daily.  Marland Kitchen thiamine 100 MG tablet Take by mouth.  Marland Kitchen ZOHYDRO ER 15 MG C12A   . zolpidem (AMBIEN) 10 MG tablet Take 1 tablet (10  mg total) by mouth at bedtime as needed for sleep.   No Known Allergies No results found for this or any previous visit (from the past 2160 hour(s)). Objective  Body mass index is 18.46 kg/m. Wt Readings from Last 3 Encounters:  02/27/18 125 lb (56.7 kg)  01/23/18 129 lb 6 oz (58.7 kg)  01/15/18 127 lb 3.2 oz (57.7 kg)   Temp Readings from Last 3 Encounters:  02/27/18 98.1 F (36.7 C) (Oral)  01/23/18 98.2 F (36.8 C) (Oral)  01/15/18 98.3 F (36.8 C) (Oral)   BP Readings from Last 3 Encounters:  02/27/18 122/72  01/23/18 118/82  01/15/18 108/66   Pulse Readings from Last 3 Encounters:  02/27/18 66  01/23/18 64  01/15/18 76    Physical Exam  Constitutional: He is oriented to person, place, and time. Vital signs are normal. He appears well-developed and well-nourished. He is cooperative.  HENT:  Head: Normocephalic and atraumatic.  Mouth/Throat: Oropharynx is clear and moist and mucous membranes are normal.  Eyes: Pupils are equal, round, and reactive to light. Conjunctivae are normal.  Cardiovascular: Normal rate, regular rhythm and normal heart sounds.  Pulmonary/Chest: Effort normal and breath sounds normal.  Neurological: He is alert and oriented to person, place, and time. Gait normal.  Skin: Skin is warm, dry and intact.  Psychiatric: He has a normal mood and affect. His speech is normal and behavior is normal. Judgment and thought content normal. Cognition and memory are normal.  Nursing note and vitals reviewed.   Assessment   1. BP controlled, h/o CAD s/p PCI 2. Eustachian tube dysfunction right no cerumen in right ear today as cause of ear sxs 3. ED 4. 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 HM Plan  1. Cont meds and f/u cards  2. Add flonase and zyrtec if nott better consider CT  sinus/ears Defibrillator cant do MRI  3. viagra generic 20 mg 1-5 pills prn not with NTG explained today  Sent [facility]  4. Hand arthritis compression gloves  5.  Declines flu shot   Had 2/3 hep B vaccines will need another at f/u  Will disc Tdap and shingrix in future  PSA had 02/2017 normal recheck today with UA, TSH, vitamin d, urine culture  -h/o hematuria CT 08/2017 with left kidney stone could be etiology   Colonoscopy 3 and 07/2017 had poor prep diverticulosis  -f/u GI 03/05/18 will ask if will order CT liver protocol for liver lesion  Other labs CMET, CBC, lipid had 01/15/18 sl anemic 10.4/34.5     Will need to see eye MD in future  Will need to do DRE in future  Provider: Dr. French Ana McLean-Scocuzza-Internal Medicine

## 2018-02-28 ENCOUNTER — Telehealth: Payer: Self-pay

## 2018-02-28 ENCOUNTER — Other Ambulatory Visit: Payer: Self-pay

## 2018-02-28 DIAGNOSIS — R933 Abnormal findings on diagnostic imaging of other parts of digestive tract: Secondary | ICD-10-CM

## 2018-02-28 DIAGNOSIS — Z1211 Encounter for screening for malignant neoplasm of colon: Secondary | ICD-10-CM

## 2018-02-28 DIAGNOSIS — K581 Irritable bowel syndrome with constipation: Secondary | ICD-10-CM

## 2018-02-28 DIAGNOSIS — R634 Abnormal weight loss: Secondary | ICD-10-CM

## 2018-02-28 LAB — URINALYSIS, ROUTINE W REFLEX MICROSCOPIC
Bilirubin Urine: NEGATIVE
Hgb urine dipstick: NEGATIVE
Ketones, ur: NEGATIVE
Leukocytes, UA: NEGATIVE
Nitrite: NEGATIVE
Protein, ur: NEGATIVE
Specific Gravity, Urine: 1.017 (ref 1.001–1.03)
pH: 5.5 (ref 5.0–8.0)

## 2018-02-28 LAB — URINE CULTURE
MICRO NUMBER:: 91398946
RESULT: NO GROWTH
SPECIMEN QUALITY:: ADEQUATE

## 2018-02-28 NOTE — Progress Notes (Signed)
Called in pt prescription for 2 doses of [facility] [address] [place]  Pt CT Abdomen/Pelvis [facility] 12- 2-19 at 3:30pm

## 2018-02-28 NOTE — Telephone Encounter (Signed)
-----   Message from Kiran Anna, MD sent at 02/27/2018 10:14 AM EST ----- Laurice Iglesia   Inform patient he has lost more weight   1. Needs CT chest/abdomen and pelvis to evaluate 2. Colonoscopy x 2 attempts poor prep- suggest 2 day prep with 2 gallons golytely.   Since he has failed 2 attempts, schedule him for a Friday last case of the morning time slot   Kiran    ----- Message ----- From: McLean-Scocuzza, Tracy N, MD Sent: 02/27/2018  10:02 AM EST To: Kiran Anna, MD  Will you do a repeat colonoscopy?  Will you do CT liver protocol to work up liver lesion seen on last CT?   Thanks tMS  

## 2018-02-28 NOTE — Telephone Encounter (Signed)
-----   Message from Wyline MoodKiran Anna, MD sent at 02/27/2018 10:14 AM EST ----- Denton MeekJadijah   Inform patient he has lost more weight   1. Needs CT chest/abdomen and pelvis to evaluate 2. Colonoscopy x 2 attempts poor prep- suggest 2 day prep with 2 gallons golytely.   Since he has failed 2 attempts, schedule him for a Friday last case of the morning time slot   Kiran    ----- Message ----- From: McLean-Scocuzza, Pasty Spillersracy N, MD Sent: 02/27/2018  10:02 AM EST To: Wyline MoodKiran Anna, MD  Will you do a repeat colonoscopy?  Will you do CT liver protocol to work up liver lesion seen on last CT?   Thanks tMS

## 2018-02-28 NOTE — Telephone Encounter (Signed)
Spoke with pt regarding cardiac clearance and blood thinner holding instructions. I informed pt that we received a response from his cardiologist that pt is not cleared for the colonoscopy procedure due to recent cardiac procedure. Pt is not cleared to hold Plavix until April 2020.

## 2018-02-28 NOTE — Telephone Encounter (Signed)
Spoke with pt and informed him of Dr. Johnney KillianAnna's instructions to schedule a repeat colonoscopy following a 2-day prep with 2 gallons of Golytely and to schedule a repeat CT scan. I explained to pt that we will send the prescripton for the [facility]ely [facility]. Pt is aware that we are sending printed procedure prep instructions via mail. Both appointments have been scheduled. Pt is also aware that we are waiting for cardiac clearance and Plavix holding instruction from his cardiologist Dr. Maurine CaneStiber.

## 2018-03-04 ENCOUNTER — Telehealth: Payer: Self-pay | Admitting: Gastroenterology

## 2018-03-04 NOTE — Telephone Encounter (Signed)
Vicky  E from [employer] left vm regarding pt case being Pending  Please call [phone] ref# [id number]

## 2018-03-04 NOTE — Telephone Encounter (Signed)
Pt called to cancel his office visit 03/05/2018 due to heart complications  He is also scheduled for 03/15/2018 for procedure

## 2018-03-05 ENCOUNTER — Ambulatory Visit: Payer: Medicare HMO | Admitting: Gastroenterology

## 2018-03-05 ENCOUNTER — Other Ambulatory Visit: Payer: Self-pay

## 2018-03-05 DIAGNOSIS — R933 Abnormal findings on diagnostic imaging of other parts of digestive tract: Secondary | ICD-10-CM

## 2018-03-08 DIAGNOSIS — Z95810 Presence of automatic (implantable) cardiac defibrillator: Secondary | ICD-10-CM | POA: Diagnosis not present

## 2018-03-08 DIAGNOSIS — Z4502 Encounter for adjustment and management of automatic implantable cardiac defibrillator: Secondary | ICD-10-CM | POA: Diagnosis not present

## 2018-03-11 ENCOUNTER — Ambulatory Visit: Admission: RE | Admit: 2018-03-11 | Payer: Medicare HMO | Source: Ambulatory Visit

## 2018-03-13 DIAGNOSIS — M47816 Spondylosis without myelopathy or radiculopathy, lumbar region: Secondary | ICD-10-CM | POA: Diagnosis not present

## 2018-03-13 DIAGNOSIS — Z79891 Long term (current) use of opiate analgesic: Secondary | ICD-10-CM | POA: Diagnosis not present

## 2018-03-13 DIAGNOSIS — M545 Low back pain: Secondary | ICD-10-CM | POA: Diagnosis not present

## 2018-03-13 DIAGNOSIS — Z79899 Other long term (current) drug therapy: Secondary | ICD-10-CM | POA: Diagnosis not present

## 2018-03-13 DIAGNOSIS — G894 Chronic pain syndrome: Secondary | ICD-10-CM | POA: Diagnosis not present

## 2018-03-13 DIAGNOSIS — M79609 Pain in unspecified limb: Secondary | ICD-10-CM | POA: Diagnosis not present

## 2018-03-15 ENCOUNTER — Ambulatory Visit: Admit: 2018-03-15 | Payer: Medicare HMO | Admitting: Gastroenterology

## 2018-03-15 SURGERY — COLONOSCOPY WITH PROPOFOL
Anesthesia: General

## 2018-04-17 DIAGNOSIS — M25551 Pain in right hip: Secondary | ICD-10-CM | POA: Diagnosis not present

## 2018-04-17 DIAGNOSIS — M47816 Spondylosis without myelopathy or radiculopathy, lumbar region: Secondary | ICD-10-CM | POA: Diagnosis not present

## 2018-04-17 DIAGNOSIS — M79609 Pain in unspecified limb: Secondary | ICD-10-CM | POA: Diagnosis not present

## 2018-04-17 DIAGNOSIS — G894 Chronic pain syndrome: Secondary | ICD-10-CM | POA: Diagnosis not present

## 2018-04-17 DIAGNOSIS — M47817 Spondylosis without myelopathy or radiculopathy, lumbosacral region: Secondary | ICD-10-CM | POA: Diagnosis not present

## 2018-04-18 ENCOUNTER — Emergency Department: Payer: Medicare HMO

## 2018-04-18 ENCOUNTER — Inpatient Hospital Stay
Admission: EM | Admit: 2018-04-18 | Discharge: 2018-04-23 | DRG: 872 | Disposition: A | Discharge status: Home / Self Care | Payer: Medicare HMO | Attending: Internal Medicine | Admitting: Internal Medicine

## 2018-04-18 ENCOUNTER — Encounter: Payer: Self-pay | Admitting: *Deleted

## 2018-04-18 ENCOUNTER — Other Ambulatory Visit: Payer: Self-pay

## 2018-04-18 DIAGNOSIS — I959 Hypotension, unspecified: Secondary | ICD-10-CM | POA: Diagnosis present

## 2018-04-18 DIAGNOSIS — M9689 Other intraoperative and postprocedural complications and disorders of the musculoskeletal system: Secondary | ICD-10-CM | POA: Diagnosis not present

## 2018-04-18 DIAGNOSIS — I252 Old myocardial infarction: Secondary | ICD-10-CM

## 2018-04-18 DIAGNOSIS — Z8042 Family history of malignant neoplasm of prostate: Secondary | ICD-10-CM

## 2018-04-18 DIAGNOSIS — F17210 Nicotine dependence, cigarettes, uncomplicated: Secondary | ICD-10-CM | POA: Diagnosis present

## 2018-04-18 DIAGNOSIS — Z8249 Family history of ischemic heart disease and other diseases of the circulatory system: Secondary | ICD-10-CM | POA: Diagnosis not present

## 2018-04-18 DIAGNOSIS — R110 Nausea: Secondary | ICD-10-CM | POA: Diagnosis not present

## 2018-04-18 DIAGNOSIS — Z7982 Long term (current) use of aspirin: Secondary | ICD-10-CM | POA: Diagnosis not present

## 2018-04-18 DIAGNOSIS — E785 Hyperlipidemia, unspecified: Secondary | ICD-10-CM | POA: Diagnosis not present

## 2018-04-18 DIAGNOSIS — R079 Chest pain, unspecified: Secondary | ICD-10-CM | POA: Diagnosis not present

## 2018-04-18 DIAGNOSIS — I2510 Atherosclerotic heart disease of native coronary artery without angina pectoris: Secondary | ICD-10-CM | POA: Diagnosis not present

## 2018-04-18 DIAGNOSIS — R5082 Postprocedural fever: Secondary | ICD-10-CM | POA: Diagnosis not present

## 2018-04-18 DIAGNOSIS — T380X5A Adverse effect of glucocorticoids and synthetic analogues, initial encounter: Secondary | ICD-10-CM | POA: Diagnosis present

## 2018-04-18 DIAGNOSIS — E876 Hypokalemia: Secondary | ICD-10-CM | POA: Diagnosis present

## 2018-04-18 DIAGNOSIS — Z8679 Personal history of other diseases of the circulatory system: Secondary | ICD-10-CM | POA: Diagnosis not present

## 2018-04-18 DIAGNOSIS — R0902 Hypoxemia: Secondary | ICD-10-CM | POA: Diagnosis not present

## 2018-04-18 DIAGNOSIS — M545 Low back pain, unspecified: Secondary | ICD-10-CM

## 2018-04-18 DIAGNOSIS — R69 Illness, unspecified: Secondary | ICD-10-CM | POA: Diagnosis not present

## 2018-04-18 DIAGNOSIS — A419 Sepsis, unspecified organism: Secondary | ICD-10-CM | POA: Diagnosis not present

## 2018-04-18 DIAGNOSIS — Z765 Malingerer [conscious simulation]: Secondary | ICD-10-CM

## 2018-04-18 DIAGNOSIS — G8929 Other chronic pain: Secondary | ICD-10-CM | POA: Diagnosis present

## 2018-04-18 DIAGNOSIS — I700 Atherosclerosis of aorta: Secondary | ICD-10-CM | POA: Diagnosis present

## 2018-04-18 DIAGNOSIS — K219 Gastro-esophageal reflux disease without esophagitis: Secondary | ICD-10-CM | POA: Diagnosis not present

## 2018-04-18 DIAGNOSIS — Z833 Family history of diabetes mellitus: Secondary | ICD-10-CM | POA: Diagnosis not present

## 2018-04-18 DIAGNOSIS — I25110 Atherosclerotic heart disease of native coronary artery with unstable angina pectoris: Secondary | ICD-10-CM | POA: Diagnosis not present

## 2018-04-18 DIAGNOSIS — Z950 Presence of cardiac pacemaker: Secondary | ICD-10-CM

## 2018-04-18 DIAGNOSIS — Z7902 Long term (current) use of antithrombotics/antiplatelets: Secondary | ICD-10-CM

## 2018-04-18 DIAGNOSIS — Z955 Presence of coronary angioplasty implant and graft: Secondary | ICD-10-CM | POA: Diagnosis not present

## 2018-04-18 DIAGNOSIS — I509 Heart failure, unspecified: Secondary | ICD-10-CM | POA: Diagnosis not present

## 2018-04-18 DIAGNOSIS — Z823 Family history of stroke: Secondary | ICD-10-CM | POA: Diagnosis not present

## 2018-04-18 DIAGNOSIS — M549 Dorsalgia, unspecified: Secondary | ICD-10-CM | POA: Diagnosis not present

## 2018-04-18 DIAGNOSIS — I444 Left anterior fascicular block: Secondary | ICD-10-CM | POA: Diagnosis not present

## 2018-04-18 DIAGNOSIS — I255 Ischemic cardiomyopathy: Secondary | ICD-10-CM | POA: Diagnosis not present

## 2018-04-18 DIAGNOSIS — R112 Nausea with vomiting, unspecified: Secondary | ICD-10-CM | POA: Diagnosis not present

## 2018-04-18 DIAGNOSIS — Z79891 Long term (current) use of opiate analgesic: Secondary | ICD-10-CM

## 2018-04-18 DIAGNOSIS — Z95810 Presence of automatic (implantable) cardiac defibrillator: Secondary | ICD-10-CM | POA: Diagnosis not present

## 2018-04-18 DIAGNOSIS — I714 Abdominal aortic aneurysm, without rupture: Secondary | ICD-10-CM | POA: Diagnosis not present

## 2018-04-18 DIAGNOSIS — Z79899 Other long term (current) drug therapy: Secondary | ICD-10-CM | POA: Diagnosis not present

## 2018-04-18 DIAGNOSIS — Z01810 Encounter for preprocedural cardiovascular examination: Secondary | ICD-10-CM | POA: Diagnosis not present

## 2018-04-18 DIAGNOSIS — I110 Hypertensive heart disease with heart failure: Secondary | ICD-10-CM | POA: Diagnosis present

## 2018-04-18 DIAGNOSIS — R05 Cough: Secondary | ICD-10-CM | POA: Diagnosis not present

## 2018-04-18 DIAGNOSIS — M5136 Other intervertebral disc degeneration, lumbar region: Secondary | ICD-10-CM | POA: Diagnosis not present

## 2018-04-18 DIAGNOSIS — R509 Fever, unspecified: Secondary | ICD-10-CM | POA: Diagnosis not present

## 2018-04-18 DIAGNOSIS — I5022 Chronic systolic (congestive) heart failure: Secondary | ICD-10-CM | POA: Diagnosis present

## 2018-04-18 DIAGNOSIS — Z7951 Long term (current) use of inhaled steroids: Secondary | ICD-10-CM

## 2018-04-18 LAB — URINALYSIS, COMPLETE (UACMP) WITH MICROSCOPIC
Bacteria, UA: NONE SEEN
Bilirubin Urine: NEGATIVE
GLUCOSE, UA: NEGATIVE mg/dL
Ketones, ur: NEGATIVE mg/dL
Leukocytes, UA: NEGATIVE
Nitrite: NEGATIVE
PROTEIN: NEGATIVE mg/dL
Specific Gravity, Urine: 1.018 (ref 1.005–1.030)
pH: 5 (ref 5.0–8.0)

## 2018-04-18 LAB — INFLUENZA PANEL BY PCR (TYPE A & B)
Influenza A By PCR: NEGATIVE
Influenza B By PCR: NEGATIVE

## 2018-04-18 LAB — COMPREHENSIVE METABOLIC PANEL
ALT: 17 U/L (ref 0–44)
ANION GAP: 10 (ref 5–15)
AST: 27 U/L (ref 15–41)
Albumin: 4.7 g/dL (ref 3.5–5.0)
Alkaline Phosphatase: 51 U/L (ref 38–126)
BUN: 18 mg/dL (ref 8–23)
CO2: 23 mmol/L (ref 22–32)
Calcium: 9 mg/dL (ref 8.9–10.3)
Chloride: 104 mmol/L (ref 98–111)
Creatinine, Ser: 0.72 mg/dL (ref 0.61–1.24)
GFR calc Af Amer: >60 mL/min (ref >60)
GFR calc non Af Amer: >60 mL/min (ref >60)
GLUCOSE: 86 mg/dL (ref 70–99)
Potassium: 3.2 mmol/L — ABNORMAL LOW (ref 3.5–5.1)
Sodium: 137 mmol/L (ref 135–145)
TOTAL PROTEIN: 7.6 g/dL (ref 6.5–8.1)
Total Bilirubin: 0.5 mg/dL (ref 0.3–1.2)

## 2018-04-18 LAB — CBC
HCT: 44.1 % (ref 39.0–52.0)
Hemoglobin: 14.2 g/dL (ref 13.0–17.0)
MCH: 29.9 pg (ref 26.0–34.0)
MCHC: 32.2 g/dL (ref 30.0–36.0)
MCV: 92.8 fL (ref 80.0–100.0)
Platelets: 235 10*3/uL (ref 150–400)
RBC: 4.75 MIL/uL (ref 4.22–5.81)
RDW: 15.5 % (ref 11.5–15.5)
WBC: 16.4 10*3/uL — ABNORMAL HIGH (ref 4.0–10.5)
nRBC: 0 % (ref 0.0–0.2)

## 2018-04-18 LAB — PROCALCITONIN: Procalcitonin: 15.42 ng/mL

## 2018-04-18 LAB — CG4 I-STAT (LACTIC ACID): Lactic Acid, Venous: 1.38 mmol/L (ref 0.5–1.9)

## 2018-04-18 LAB — TROPONIN I: Troponin I: <0.03 ng/mL (ref <0.03)

## 2018-04-18 LAB — LIPASE, BLOOD: Lipase: 20 U/L (ref 11–51)

## 2018-04-18 MED ORDER — LISINOPRIL 5 MG PO TABS
5.0000 mg | ORAL_TABLET | Freq: Every day | ORAL | Status: DC
  Filled 2018-04-18: qty 1

## 2018-04-18 MED ORDER — OXYCODONE HCL 5 MG PO TABS
5.0000 mg | ORAL_TABLET | Freq: Four times a day (QID) | ORAL | Status: DC | PRN
  Administered 2018-04-19 – 2018-04-23 (×15): 5 mg via ORAL
  Filled 2018-04-18 (×15): qty 1

## 2018-04-18 MED ORDER — ACETAMINOPHEN 650 MG RE SUPP: 650.0000 mg | Freq: Four times a day (QID) | RECTAL | Status: DC | PRN

## 2018-04-18 MED ORDER — PANTOPRAZOLE SODIUM 40 MG PO TBEC
40.0000 mg | DELAYED_RELEASE_TABLET | Freq: Every day | ORAL | Status: DC
  Administered 2018-04-19 – 2018-04-23 (×5): 40 mg via ORAL
  Filled 2018-04-18 (×5): qty 1

## 2018-04-18 MED ORDER — ACETAMINOPHEN 325 MG PO TABS: 650.0000 mg | ORAL_TABLET | Freq: Four times a day (QID) | ORAL | Status: DC | PRN

## 2018-04-18 MED ORDER — VITAMIN B-12 100 MCG PO TABS
100.0000 ug | ORAL_TABLET | Freq: Every day | ORAL | Status: DC
  Administered 2018-04-19 – 2018-04-23 (×5): 100 ug via ORAL
  Filled 2018-04-18 (×5): qty 1

## 2018-04-18 MED ORDER — ASPIRIN EC 81 MG PO TBEC
81.0000 mg | DELAYED_RELEASE_TABLET | Freq: Every day | ORAL | Status: DC
  Administered 2018-04-19 – 2018-04-23 (×5): 81 mg via ORAL
  Filled 2018-04-18 (×5): qty 1

## 2018-04-18 MED ORDER — METRONIDAZOLE IN NACL 5-0.79 MG/ML-% IV SOLN
500.0000 mg | Freq: Three times a day (TID) | INTRAVENOUS | Status: DC
  Administered 2018-04-18: 500 mg via INTRAVENOUS
  Filled 2018-04-18 (×3): qty 100

## 2018-04-18 MED ORDER — MORPHINE SULFATE (PF) 4 MG/ML IV SOLN
4.0000 mg | Freq: Once | INTRAVENOUS | Status: AC
  Administered 2018-04-18: 4 mg via INTRAVENOUS
  Filled 2018-04-18: qty 1

## 2018-04-18 MED ORDER — OXYCODONE-ACETAMINOPHEN 5-325 MG PO TABS
1.0000 | ORAL_TABLET | Freq: Four times a day (QID) | ORAL | Status: DC | PRN
  Administered 2018-04-19 – 2018-04-23 (×14): 1 via ORAL
  Filled 2018-04-18 (×14): qty 1

## 2018-04-18 MED ORDER — POLYETHYLENE GLYCOL 3350 17 G PO PACK
17.0000 g | PACK | Freq: Every day | ORAL | Status: DC | PRN
  Administered 2018-04-20 – 2018-04-21 (×2): 17 g via ORAL
  Filled 2018-04-18 (×2): qty 1

## 2018-04-18 MED ORDER — MIRTAZAPINE 15 MG PO TABS
30.0000 mg | ORAL_TABLET | Freq: Every day | ORAL | Status: DC
  Administered 2018-04-19 – 2018-04-22 (×5): 30 mg via ORAL
  Filled 2018-04-18 (×5): qty 2

## 2018-04-18 MED ORDER — POTASSIUM CHLORIDE CRYS ER 20 MEQ PO TBCR
40.0000 meq | EXTENDED_RELEASE_TABLET | Freq: Once | ORAL | Status: AC
  Administered 2018-04-19: 40 meq via ORAL
  Filled 2018-04-18: qty 2

## 2018-04-18 MED ORDER — VANCOMYCIN HCL 500 MG IV SOLR
500.0000 mg | Freq: Once | INTRAVENOUS | Status: AC
  Administered 2018-04-19: 500 mg via INTRAVENOUS
  Filled 2018-04-18 (×2): qty 500

## 2018-04-18 MED ORDER — ONDANSETRON HCL 4 MG/2ML IJ SOLN: 4.0000 mg | Freq: Four times a day (QID) | INTRAMUSCULAR | Status: DC | PRN

## 2018-04-18 MED ORDER — SODIUM CHLORIDE 0.9 % IV SOLN
2.0000 g | Freq: Once | INTRAVENOUS | Status: AC
  Administered 2018-04-18: 2 g via INTRAVENOUS
  Filled 2018-04-18: qty 2

## 2018-04-18 MED ORDER — OXYCODONE-ACETAMINOPHEN 10-325 MG PO TABS: 1.0000 | ORAL_TABLET | Freq: Four times a day (QID) | ORAL | Status: DC | PRN

## 2018-04-18 MED ORDER — ENOXAPARIN SODIUM 40 MG/0.4ML ~~LOC~~ SOLN
40.0000 mg | SUBCUTANEOUS | Status: DC
  Administered 2018-04-19 – 2018-04-22 (×4): 40 mg via SUBCUTANEOUS
  Filled 2018-04-18 (×4): qty 0.4

## 2018-04-18 MED ORDER — MELATONIN 5 MG PO TABS
2.5000 mg | ORAL_TABLET | Freq: Every evening | ORAL | Status: DC | PRN
  Administered 2018-04-20: 2.5 mg via ORAL
  Filled 2018-04-18 (×2): qty 0.5

## 2018-04-18 MED ORDER — GABAPENTIN 300 MG PO CAPS
300.0000 mg | ORAL_CAPSULE | Freq: Three times a day (TID) | ORAL | Status: DC | PRN
  Administered 2018-04-19 – 2018-04-22 (×4): 300 mg via ORAL
  Filled 2018-04-18 (×4): qty 1

## 2018-04-18 MED ORDER — ONDANSETRON HCL 4 MG/2ML IJ SOLN
4.0000 mg | Freq: Once | INTRAMUSCULAR | Status: AC | PRN
  Administered 2018-04-18: 4 mg via INTRAVENOUS
  Filled 2018-04-18: qty 2

## 2018-04-18 MED ORDER — MELATONIN 3 MG PO TABS: 3.0000 mg | ORAL_TABLET | Freq: Every evening | ORAL | Status: DC | PRN

## 2018-04-18 MED ORDER — SODIUM CHLORIDE 0.9 % IV BOLUS
500.0000 mL | Freq: Once | INTRAVENOUS | Status: AC
  Administered 2018-04-18: 500 mL via INTRAVENOUS

## 2018-04-18 MED ORDER — BACLOFEN 10 MG PO TABS
5.0000 mg | ORAL_TABLET | Freq: Two times a day (BID) | ORAL | Status: DC
  Administered 2018-04-18 – 2018-04-23 (×10): 5 mg via ORAL
  Filled 2018-04-18 (×11): qty 0.5

## 2018-04-18 MED ORDER — ONDANSETRON HCL 4 MG PO TABS: 4.0000 mg | ORAL_TABLET | Freq: Four times a day (QID) | ORAL | Status: DC | PRN

## 2018-04-18 MED ORDER — CITALOPRAM HYDROBROMIDE 20 MG PO TABS
10.0000 mg | ORAL_TABLET | Freq: Every day | ORAL | Status: DC
  Administered 2018-04-19: 10 mg via ORAL
  Filled 2018-04-18: qty 1

## 2018-04-18 MED ORDER — LORATADINE 10 MG PO TABS
10.0000 mg | ORAL_TABLET | Freq: Every day | ORAL | Status: DC
  Administered 2018-04-19 – 2018-04-23 (×5): 10 mg via ORAL
  Filled 2018-04-18 (×5): qty 1

## 2018-04-18 MED ORDER — ACETAMINOPHEN 325 MG PO TABS
650.0000 mg | ORAL_TABLET | Freq: Once | ORAL | Status: AC
  Administered 2018-04-18: 650 mg via ORAL
  Filled 2018-04-18: qty 2

## 2018-04-18 MED ORDER — AMLODIPINE BESYLATE 5 MG PO TABS
2.5000 mg | ORAL_TABLET | Freq: Every day | ORAL | Status: DC
  Filled 2018-04-18: qty 1

## 2018-04-18 MED ORDER — IPRATROPIUM BROMIDE 0.02 % IN SOLN: 0.5000 mg | Freq: Three times a day (TID) | RESPIRATORY_TRACT | Status: DC | PRN

## 2018-04-18 MED ORDER — VITAMIN B-1 100 MG PO TABS
100.0000 mg | ORAL_TABLET | Freq: Every day | ORAL | Status: DC
  Administered 2018-04-19 – 2018-04-23 (×5): 100 mg via ORAL
  Filled 2018-04-18 (×5): qty 1

## 2018-04-18 MED ORDER — VANCOMYCIN HCL IN DEXTROSE 1-5 GM/200ML-% IV SOLN
1000.0000 mg | Freq: Two times a day (BID) | INTRAVENOUS | Status: DC
  Administered 2018-04-19: 1000 mg via INTRAVENOUS
  Filled 2018-04-18 (×2): qty 200

## 2018-04-18 MED ORDER — HYDROMORPHONE HCL 1 MG/ML IJ SOLN
1.0000 mg | INTRAMUSCULAR | Status: DC | PRN
  Administered 2018-04-18: 1 mg via INTRAVENOUS
  Filled 2018-04-18: qty 1

## 2018-04-18 MED ORDER — METOPROLOL SUCCINATE ER 25 MG PO TB24
25.0000 mg | ORAL_TABLET | Freq: Every day | ORAL | Status: DC
  Administered 2018-04-20 – 2018-04-23 (×4): 25 mg via ORAL
  Filled 2018-04-18 (×5): qty 1

## 2018-04-18 MED ORDER — FOLIC ACID 1 MG PO TABS
1.0000 mg | ORAL_TABLET | Freq: Every day | ORAL | Status: DC
  Administered 2018-04-19 – 2018-04-23 (×5): 1 mg via ORAL
  Filled 2018-04-18 (×5): qty 1

## 2018-04-18 MED ORDER — FLUTICASONE PROPIONATE 50 MCG/ACT NA SUSP
1.0000 | Freq: Every day | NASAL | Status: DC
  Administered 2018-04-19 – 2018-04-23 (×3): 2 via NASAL
  Filled 2018-04-18: qty 16

## 2018-04-18 MED ORDER — CLOPIDOGREL BISULFATE 75 MG PO TABS
75.0000 mg | ORAL_TABLET | Freq: Every day | ORAL | Status: DC
  Administered 2018-04-19 – 2018-04-23 (×5): 75 mg via ORAL
  Filled 2018-04-18 (×5): qty 1

## 2018-04-18 MED ORDER — ATORVASTATIN CALCIUM 20 MG PO TABS
80.0000 mg | ORAL_TABLET | Freq: Every day | ORAL | Status: DC
  Administered 2018-04-19 – 2018-04-22 (×4): 80 mg via ORAL
  Filled 2018-04-18 (×4): qty 4

## 2018-04-18 MED ORDER — VANCOMYCIN HCL IN DEXTROSE 1-5 GM/200ML-% IV SOLN
1000.0000 mg | Freq: Once | INTRAVENOUS | Status: AC
  Administered 2018-04-18: 1000 mg via INTRAVENOUS
  Filled 2018-04-18: qty 200

## 2018-04-18 MED ORDER — ISOSORBIDE MONONITRATE ER 30 MG PO TB24
60.0000 mg | ORAL_TABLET | Freq: Two times a day (BID) | ORAL | Status: DC
  Administered 2018-04-19: 60 mg via ORAL
  Filled 2018-04-18 (×2): qty 2

## 2018-04-18 MED ORDER — ZOLPIDEM TARTRATE 5 MG PO TABS: 10.0000 mg | ORAL_TABLET | Freq: Every evening | ORAL | Status: DC | PRN

## 2018-04-18 NOTE — ED Notes (Signed)
Pt is resting in bed talking on his cell phone. NADN at this time.

## 2018-04-18 NOTE — ED Notes (Signed)
EMS IV not drawing back blood at this time. PT given nausea medications before chest Xray. Tylenol is held at this time per pt request for nausea medications to take effect.

## 2018-04-18 NOTE — Consult Note (Signed)
Referring Physician:  No referring provider defined for this encounter.  Primary Physician:  McLean-Scocuzza, Pasty Spillers, MD  Chief Complaint: Fever and back pain  History of Present Illness: Martin Hernandez is a 63 y.o. male who presents as a consult from the emergency department for fever and back pain.  Patient is under the care of a pain clinic in [place] and received epidural steroid injection yesterday for back pain and lower extremity symptoms.  Last night he began noticing concerning symptoms in relation to infection, but this morning he began running very high fever.  He has contacted the pain clinic and made them aware of potential side effects to [facility].  Has received ESI in the past without any issue.  Complains of significant back pain at this time.  He has persistent posterior lower extremity tingling (L>R) which he has been experiencing prior to recent injection.  Denies bladder/bowel dysfunction, saddle paresthesia, recent falls, lower extremity pain/numbness/weakness.  Review of Systems:  A 10 point review of systems is negative, except for the pertinent positives and negatives detailed in the HPI.  Past Medical History: Past Medical History:  Diagnosis Date  . Allergy   . CAD (coronary artery disease)   . Depression   . Frequent headaches   . GERD (gastroesophageal reflux disease)   . Headache   . Hyperlipidemia   . Hypertension   . PAD (peripheral artery disease) (HCC)   . Tobacco abuse     Past Surgical History: Past Surgical History:  Procedure Laterality Date  . COLONOSCOPY WITH PROPOFOL N/A 06/20/2017   Procedure: COLONOSCOPY WITH PROPOFOL;  Surgeon: Wyline Mood, MD;  Location: [facility] ENDOSCOPY;  Service: Gastroenterology;  Laterality: N/A;  . COLONOSCOPY WITH PROPOFOL N/A 07/24/2017   Procedure: COLONOSCOPY WITH PROPOFOL;  Surgeon: Wyline Mood, MD;  Location: [facility] ENDOSCOPY;  Service: Gastroenterology;  Laterality: N/A;  . ESOPHAGOGASTRODUODENOSCOPY (EGD)  WITH PROPOFOL N/A 06/20/2017   Procedure: ESOPHAGOGASTRODUODENOSCOPY (EGD) WITH PROPOFOL;  Surgeon: Wyline Mood, MD;  Location: [facility] ENDOSCOPY;  Service: Gastroenterology;  Laterality: N/A;  . PERCUTANEOUS CORONARY STENT INTERVENTION (PCI-S)    . STOMACH SURGERY     2009 [facility] per chart review pt had AAA repair   . TONSILLECTOMY      Allergies: Allergies as of 04/18/2018  . (No Known Allergies)    Medications:  Current Facility-Administered Medications:  .  HYDROmorphone (DILAUDID) injection 1 mg, 1 mg, Intravenous, Q3H PRN, Mayo, Allyn Kenner, MD, 1 mg at 04/18/18 1822 .  metroNIDAZOLE (FLAGYL) IVPB 500 mg, 500 mg, Intravenous, Q8H, Schaevitz, Myra Rude, MD, Stopped at 04/18/18 1726  Current Outpatient Medications:  .  amLODipine (NORVASC) 2.5 MG tablet, Take 2.5 mg by mouth daily. , Disp: , Rfl:  .  aspirin 81 MG tablet, Take 81 mg by mouth daily., Disp: , Rfl:  .  atorvastatin (LIPITOR) 80 MG tablet, Take 1 tablet (80 mg total) by mouth daily at 6 PM., Disp: 90 tablet, Rfl: 3 .  baclofen (LIORESAL) 10 MG tablet, Take 0.5 tablets (5 mg total) by mouth 2 (two) times daily., Disp: 60 tablet, Rfl: 5 .  carbamide peroxide (DEBROX) 6.5 % OTIC solution, Place 5 drops into the right ear 2 (two) times daily. X 1 week, Disp: 15 mL, Rfl: 0 .  cetirizine (ZYRTEC) 10 MG tablet, Take 1 tablet (10 mg total) by mouth daily. At night, Disp: 90 tablet, Rfl: 3 .  Cholecalciferol (VITAMIN D3) 1000 units CAPS, Take by mouth., Disp: , Rfl:  .  Cholecalciferol 1.25  MG (50000 UT) capsule, Take 1 capsule (50,000 Units total) by mouth once a week., Disp: 13 capsule, Rfl: 1 .  citalopram (CELEXA) 10 MG tablet, Take 1 tablet (10 mg total) by mouth daily., Disp: 90 tablet, Rfl: 0 .  clopidogrel (PLAVIX) 75 MG tablet, TAKE ONE (1) TABLET EACH DAY, Disp: 90 tablet, Rfl: 2 .  [employer] CITRATE OF MAGNESIA SOLN, TAKE 296 MLS (1 BOTTLE TOTAL) BY MOUTH ONCE FOR 1 DOSE., Disp: , Rfl: 0 .  cyanocobalamin 100 MCG tablet, Take  100 mcg by mouth daily., Disp: , Rfl:  .  fluticasone (FLONASE) 50 MCG/ACT nasal spray, Place 1-2 sprays into both nostrils daily., Disp: 16 g, Rfl: 11 .  folic acid (FOLVITE) 1 MG tablet, Take 1 mg by mouth daily., Disp: , Rfl:  .  gabapentin (NEURONTIN) 300 MG capsule, , Disp: , Rfl:  .  ipratropium (ATROVENT) 0.02 % nebulizer solution, Inhale into the lungs., Disp: , Rfl:  .  isosorbide mononitrate (IMDUR) 30 MG 24 hr tablet, Take 60 mg by mouth 2 (two) times daily. , Disp: , Rfl:  .  linaclotide (LINZESS) 145 MCG CAPS capsule, Take 1 capsule (145 mcg total) by mouth daily before breakfast., Disp: 61 capsule, Rfl: 1 .  lisinopril (PRINIVIL,ZESTRIL) 5 MG tablet, 1 pill daily, Disp: 90 tablet, Rfl: 3 .  Melatonin 3 MG TABS, Take 3 mg by mouth., Disp: , Rfl:  .  metoprolol succinate (TOPROL-XL) 25 MG 24 hr tablet, Take 1 tablet (25 mg total) by mouth daily., Disp: 90 tablet, Rfl: 3 .  mirtazapine (REMERON) 30 MG tablet, 1 pill nightly, Disp: 90 tablet, Rfl: 3 .  nitroGLYCERIN (NITROSTAT) 0.4 MG SL tablet, Place 1 tablet (0.4 mg total) under the tongue every 5 (five) minutes as needed for chest pain., Disp: 90 tablet, Rfl: 3 .  oxyCODONE-acetaminophen (PERCOCET) 10-325 MG tablet, , Disp: , Rfl:  .  pantoprazole (PROTONIX) 40 MG tablet, Take 1 tablet (40 mg total) by mouth daily. 30 minutes before food, Disp: 30 tablet, Rfl: 0 .  Polyethylene Glycol 3350 (PEG 3350) POWD, Please take this medication 1 to 3 times daily so that you have bowel movements everyday., Disp: , Rfl:  .  pyridOXINE (VITAMIN B-6) 100 MG tablet, Take 1 tablet (100 mg total) by mouth daily., Disp: 90 tablet, Rfl: 3 .  sildenafil (REVATIO) 20 MG tablet, 1-5 pills as needed before sex 30 minutes to 4 hours, Disp: 30 tablet, Rfl: 11 .  thiamine 100 MG tablet, Take by mouth., Disp: , Rfl:  .  ZOHYDRO ER 15 MG C12A, , Disp: , Rfl:  .  zolpidem (AMBIEN) 10 MG tablet, Take 1 tablet (10 mg total) by mouth at bedtime as needed for sleep.,  Disp: 30 tablet, Rfl: 2   Social History: Social History   Tobacco Use  . Smoking status: Current Every Day Smoker    Packs/day: 0.25    Types: Cigarettes  . Smokeless tobacco: Never Used  Substance Use Topics  . Alcohol use: No    Alcohol/week: 0.0 standard drinks    Comment: quit 1 year ago  . Drug use: No    Family Medical History: Family History  Problem Relation Age of Onset  . Diabetes Mother   . Heart disease Mother   . Hypertension Mother   . Stroke Mother   . Prostate cancer Father   . Cancer Father        prostate cancer in 2950s  . Diabetes Sister   .  Heart disease Sister   . Ulcers Other        unknown who had ulcers in family     Physical Examination: Vitals:   04/18/18 1530 04/18/18 1700  BP: 106/78 109/72  Pulse: (!) 105 99  Resp: 16 18  Temp:    SpO2: 94% 93%     General: Patient is well developed, well nourished, calm, collected, and in moderate distress.  Psychiatric: Patient is non-anxious.  Head:  Pupils equal, round, and reactive to light.  ENT:  Oral mucosa appears well hydrated.  Respiratory: Patient is breathing without any difficulty.  Extremities: No edema.  Skin:   On exposed skin, there are no abnormal skin lesions.  NEUROLOGICAL:  General: In no acute distress.   Awake, alert, oriented to person, place, and time.  Pupils equal round and reactive to light.  Facial tone is symmetric.   ROM of spine: Not assessed. palpation of spine: Significant tenderness even to light touch left lumbar to mid thoracic spine.  Strength: Side Biceps Triceps Deltoid Interossei Grip Wrist Ext. Wrist Flex.  R 5 5 5 5 5 5 5   L 5 5 5 5 5 5 5    Side Iliopsoas Quads Hamstring PF DF EHL  R 5 5 5 5 5 5   L 5 5 5 5 5 5     Imaging: EXAM: CT LUMBAR SPINE WITHOUT CONTRAST  TECHNIQUE: Multidetector CT imaging of the lumbar spine was performed without intravenous contrast administration. Multiplanar CT image reconstructions were also  generated.  COMPARISON:  01/03/2018  FINDINGS: Segmentation: Standard.  Alignment: Mild chronic lumbar spine straightening. No significant listhesis.  Vertebrae: No acute fracture or suspicious osseous lesion. No interval osseous erosion to suggest discitis-osteomyelitis.  Paraspinal and other soft tissues: Small locules of gas in the soft tissues posterior to the left L5 lamina, possibly related to the recent injection. No paraspinal fluid collection is evident on this unenhanced study. Left larger than right nonobstructing renal calculi. Extensive atherosclerosis of the abdominal aorta and its major branch vessels. Ectatic of infrarenal abdominal aorta. 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  Disc levels:  T12-L1 and L1-2: Negative.  L2-3: Disc bulging and facet hypertrophy result in at most mild bilateral neural foraminal stenosis without significant spinal stenosis, unchanged.  L3-4: Disc bulging and moderate facet hypertrophy result in mild bilateral neural foraminal stenosis and borderline spinal stenosis, unchanged.  L4-5: Chronic vacuum disc and disc space narrowing. Circumferential disc bulging, a broad right paracentral and subarticular disc protrusion, and mild facet hypertrophy result in asymmetric right lateral recess stenosis and mild-to-moderate bilateral neural foraminal stenosis without significant generalized spinal stenosis. The disc protrusion may be slightly smaller with slightly improved right lateral recess patency. A new small focus of ventral epidural gas in the left lateral recess likely arises from the disc without a sizable soft tissue component identified.  L5-S1: Mild disc bulging and asymmetric moderate right facet arthrosis with similar appearance of soft tissue anterior to the right facet joint which  contributes to moderate right neural foraminal stenosis with right L5 nerve root contact, possibly a synovial cyst. No spinal stenosis.  IMPRESSION: 1. No evidence of acute osseous abnormality or infection in the lumbar spine. 2. Similar appearance of lumbar disc and facet degeneration, perhaps with slightly decreased size of right-sided disc protrusion at L4-5. 3. Unchanged soft tissue anterior to the right L5-S1  facet joint, possibly a synovial cyst and resulting in right neural foraminal stenosis and potential right L5 nerve root impingement. 4.  Aortic Atherosclerosis (ICD10-I70.0).   Assessment and Plan: Mr. Mosey is a pleasant 63 y.o. male with significant back pain and complaints of fever that began 1 day after receiving an epidural steroid injection for back pain and lumbar radiculopathy.  No worsening of radiculopathy symptoms or presentation of weakness; no symptoms concerning for cauda equina syndrome.  Surgical intervention not recommended at this time.  Agree with admission for medical treatment.  Ivar Drape, PA-C Dept. of Neurosurgery

## 2018-04-18 NOTE — ED Notes (Signed)
Spouse came to the desk and stated that pt was in so much pain and needed his BP cuff adjusted. Upon entering room pt is resting in bed with his eye closed.

## 2018-04-18 NOTE — ED Notes (Signed)
Brittany RN, aware of bed assigned  

## 2018-04-18 NOTE — ED Notes (Signed)
Pt's wife to nurses station states that pt is crying and needs something for pain, "and ya'll just gave him some plain ole tylenol".  Explained to family that until pt is evaluated by a physician that we could not provide other medications.  "Then you need to get a doctor in here now."  Discussed same with Dr. Mayford Knife and verbal orders received.  Upon entering room, pt is noted texting on phone and is having a normal conversation with wife.  No crying noted and pt appears comfortable in bed.

## 2018-04-18 NOTE — ED Triage Notes (Signed)
PT to ED reporting new onset of NV today with cold chills and temperature with EMS of 99.8. Pts temp in triage is 102.6.   Pt has a cardiac hx but EMS reports the 12 lead was unremarkable. PT reporting an injection in his back yesterday to "kill the nerves" that he has not felt well since. PT unaware of what that medications name is . Pt denies having had the flu shot.

## 2018-04-18 NOTE — H&P (Addendum)
[employer] - [place] [facility]   PATIENT NAME: Martin Hernandez    MR#:  [id number]  DATE OF BIRTH:  May 31, 1955  DATE OF ADMISSION:  04/18/2018  PRIMARY CARE PHYSICIAN: McLean-Scocuzza, Pasty Spillersracy N, MD   REQUESTING/REFERRING PHYSICIAN: Gladstone Pihavid Schaevitz, MD  CHIEF COMPLAINT:   Chief Complaint  Patient presents with  . Emesis  . Fever    HISTORY OF PRESENT ILLNESS:  Martin Hernandez  is a 63 y.o. male with a known history of hypertension, hyperlipidemia, CAD, depression, PAD, chronic back pain who presented to the ED with low back pain and fevers.  He just had lumbar spinal injections yesterday at pain clinic.  He developed a fever last night.  He did not check his temperature.  He then had significant low back pain this morning.  The pain was unbearable.  He denies any numbness or tingling in his lower extremities.  In the ED, he was febrile to 102.35F and mildly tachycardic with heart rates in the high 90s to low 100s.  Labs are significant for K 3.2, WBC 16.4.  Lactic acid within normal limits.  CT lumbar spine performed (patient unable to get MRI due to pacemaker), which showed lumbar disc and facet degeneration.  He was started on broad-spectrum antibiotics.  Hospitalists were called for admission.  PAST MEDICAL HISTORY:   Past Medical History:  Diagnosis Date  . Allergy   . CAD (coronary artery disease)   . Depression   . Frequent headaches   . GERD (gastroesophageal reflux disease)   . Headache   . Hyperlipidemia   . Hypertension   . PAD (peripheral artery disease) (HCC)   . Tobacco abuse     PAST SURGICAL HISTORY:   Past Surgical History:  Procedure Laterality Date  . COLONOSCOPY WITH PROPOFOL N/A 06/20/2017   Procedure: COLONOSCOPY WITH PROPOFOL;  Surgeon: Wyline MoodAnna, Kiran, MD;  [facility];  Service: Gastroenterology;  Laterality: N/A;  . COLONOSCOPY WITH PROPOFOL N/A 07/24/2017   Procedure: COLONOSCOPY WITH PROPOFOL;  Surgeon: Wyline MoodAnna,  Kiran, MD;  [facility];  Service: Gastroenterology;  Laterality: N/A;  . ESOPHAGOGASTRODUODENOSCOPY (EGD) WITH PROPOFOL N/A 06/20/2017   Procedure: ESOPHAGOGASTRODUODENOSCOPY (EGD) WITH PROPOFOL;  Surgeon: Wyline MoodAnna, Kiran, MD;  [facility];  Service: Gastroenterology;  Laterality: N/A;  . PERCUTANEOUS CORONARY STENT INTERVENTION (PCI-S)    . STOMACH SURGERY     [facility] pt had AAA repair   . TONSILLECTOMY      SOCIAL HISTORY:   Social History   Tobacco Use  . Smoking status: Current Every Day Smoker    Packs/day: 0.25    Types: Cigarettes  . Smokeless tobacco: Never Used  Substance Use Topics  . Alcohol use: No    Alcohol/week: 0.0 standard drinks    Comment: quit 1 year ago    FAMILY HISTORY:   Family History  Problem Relation Age of Onset  . Diabetes Mother   . Heart disease Mother   . Hypertension Mother   . Stroke Mother   . Prostate cancer Father   . Cancer Father        prostate cancer in 6150s  . Diabetes Sister   . Heart disease Sister   . Ulcers Other        unknown who had ulcers in family     DRUG ALLERGIES:  No Known Allergies  REVIEW OF SYSTEMS:   Review of Systems  Constitutional: Positive for chills and fever.  HENT: Negative for  congestion and sore throat.   Eyes: Negative for blurred vision and double vision.  Respiratory: Negative for cough and shortness of breath.   Cardiovascular: Negative for chest pain, palpitations and leg swelling.  Gastrointestinal: Negative for abdominal pain, nausea and vomiting.  Genitourinary: Negative for dysuria and urgency.  Musculoskeletal: Positive for back pain. Negative for myalgias.  Neurological: Negative for dizziness and headaches.  Psychiatric/Behavioral: Negative for depression. The patient is not nervous/anxious.     MEDICATIONS AT HOME:   Prior to Admission medications   Medication Sig Start Date End Date Taking? Authorizing Provider  amLODipine (NORVASC)  2.5 MG tablet Take 2.5 mg by mouth daily.  01/16/18 01/16/19  [provider]  aspirin 81 MG tablet Take 81 mg by mouth daily.    [provider]  atorvastatin (LIPITOR) 80 MG tablet Take 1 tablet (80 mg total) by mouth daily at 6 PM. 02/20/18   McLean-Scocuzza, Pasty Spillers, MD  baclofen (LIORESAL) 10 MG tablet Take 0.5 tablets (5 mg total) by mouth 2 (two) times daily. 08/29/17   McLean-Scocuzza, Pasty Spillers, MD  carbamide peroxide (DEBROX) 6.5 % OTIC solution Place 5 drops into the right ear 2 (two) times daily. X 1 week 01/15/18   McLean-Scocuzza, Pasty Spillers, MD  cetirizine (ZYRTEC) 10 MG tablet Take 1 tablet (10 mg total) by mouth daily. At night 02/27/18   McLean-Scocuzza, Pasty Spillers, MD  Cholecalciferol (VITAMIN D3) 1000 units CAPS Take by mouth.    [provider]  Cholecalciferol 1.25 MG (50000 UT) capsule Take 1 capsule (50,000 Units total) by mouth once a week. 02/27/18   McLean-Scocuzza, Pasty Spillers, MD  citalopram (CELEXA) 10 MG tablet Take 1 tablet (10 mg total) by mouth daily. 08/08/16   Tommie Sams, DO  clopidogrel (PLAVIX) 75 MG tablet TAKE ONE (1) TABLET EACH DAY 07/30/17   McLean-Scocuzza, Pasty Spillers, MD  [employer] CITRATE OF MAGNESIA SOLN TAKE 296 MLS (1 BOTTLE TOTAL) BY MOUTH ONCE FOR 1 DOSE. 07/09/17   [provider]  cyanocobalamin 100 MCG tablet Take 100 mcg by mouth daily.    [provider]  fluticasone (FLONASE) 50 MCG/ACT nasal spray Place 1-2 sprays into both nostrils daily. 02/27/18   McLean-Scocuzza, Pasty Spillers, MD  folic acid (FOLVITE) 1 MG tablet Take 1 mg by mouth daily.    [provider]  gabapentin (NEURONTIN) 300 MG capsule  03/12/17   [provider]  ipratropium (ATROVENT) 0.02 % nebulizer solution Inhale into the lungs. 12/07/14   [provider]  isosorbide mononitrate (IMDUR) 30 MG 24 hr tablet Take 60 mg by mouth 2 (two) times daily.     [provider]  linaclotide Karlene Einstein) 145 MCG CAPS capsule Take 1 capsule  (145 mcg total) by mouth daily before breakfast. 08/01/17 10/01/17  Wyline Mood, MD  lisinopril (PRINIVIL,ZESTRIL) 5 MG tablet 1 pill daily 02/20/18   McLean-Scocuzza, Pasty Spillers, MD  Melatonin 3 MG TABS Take 3 mg by mouth.    [provider]  metoprolol succinate (TOPROL-XL) 25 MG 24 hr tablet Take 1 tablet (25 mg total) by mouth daily. 02/20/18   McLean-Scocuzza, Pasty Spillers, MD  mirtazapine (REMERON) 30 MG tablet 1 pill nightly 02/20/18   McLean-Scocuzza, Pasty Spillers, MD  nitroGLYCERIN (NITROSTAT) 0.4 MG SL tablet Place 1 tablet (0.4 mg total) under the tongue every 5 (five) minutes as needed for chest pain. 11/09/17   McLean-Scocuzza, Pasty Spillers, MD  oxyCODONE-acetaminophen (PERCOCET) 10-325 MG tablet  05/18/16   [provider]  pantoprazole (PROTONIX) 40 MG tablet Take 1 tablet (40 mg total) by mouth daily. 30 minutes before food 01/15/18   McLean-Scocuzza, Pasty Spillers, MD  Polyethylene Glycol 3350 (PEG 3350) POWD Please take this medication 1 to 3 times daily so that you have bowel movements everyday. 03/31/15   [provider]  pyridOXINE (VITAMIN B-6) 100 MG tablet Take 1 tablet (100 mg total) by mouth daily. 08/29/17   McLean-Scocuzza, Pasty Spillers, MD  sildenafil (REVATIO) 20 MG tablet 1-5 pills as needed before sex 30 minutes to 4 hours 02/27/18   McLean-Scocuzza, Pasty Spillers, MD  thiamine 100 MG tablet Take by mouth.    [provider]  ZOHYDRO ER 15 MG C12A  02/13/17   [provider]  zolpidem (AMBIEN) 10 MG tablet Take 1 tablet (10 mg total) by mouth at bedtime as needed for sleep. 12/05/17   McLean-Scocuzza, Pasty Spillers, MD      VITAL SIGNS:  Blood pressure 109/72, pulse 99, temperature (!) 102.6 F (39.2 C), temperature source Oral, resp. rate 18, height 5\' 7"  (1.702 m), weight 64.9 kg, SpO2 93 %.  PHYSICAL EXAMINATION:  Physical Exam  GENERAL:  63 y.o.-year-old patient lying in the bed with no acute distress.  EYES: Pupils equal, round, reactive to light and  accommodation. No scleral icterus. Extraocular muscles intact.  HEENT: Head atraumatic, normocephalic. Oropharynx and nasopharynx clear.  NECK:  Supple, no jugular venous distention. No thyroid enlargement, no tenderness.  LUNGS: Normal breath sounds bilaterally, no wheezing, rales,rhonchi or crepitation. No use of accessory muscles of respiration.  CARDIOVASCULAR: RRR, S1, S2 normal. No murmurs, rubs, or gallops.  ABDOMEN: Soft, nontender, nondistended. Bowel sounds present. No organomegaly or mass.  EXTREMITIES: No pedal edema, cyanosis, or clubbing.  NEUROLOGIC: Cranial nerves II through XII are intact. Limited exam of lower extremity strength secondary to pain. Sensation intact. Gait not checked.  PSYCHIATRIC: The patient is alert and oriented x 3.  SKIN: No obvious rash, lesion, or ulcer.   LABORATORY PANEL:   CBC Recent Labs  Lab 04/18/18 1332  WBC 16.4*  HGB 14.2  HCT 44.1  PLT 235   ------------------------------------------------------------------------------------------------------------------  Chemistries  Recent Labs  Lab 04/18/18 1332  NA 137  K 3.2*  CL 104  CO2 23  GLUCOSE 86  BUN 18  CREATININE 0.72  CALCIUM 9.0  AST 27  ALT 17  ALKPHOS 51  BILITOT 0.5   ------------------------------------------------------------------------------------------------------------------  Cardiac Enzymes Recent Labs  Lab 04/18/18 1332  TROPONINI <0.03   ------------------------------------------------------------------------------------------------------------------  RADIOLOGY:  Dg Chest 2 View  Result Date: 04/18/2018 CLINICAL DATA:  Cough.  Fever. EXAM: CHEST - 2 VIEW COMPARISON:  CT 08/16/2017. FINDINGS: AICD noted with tip over right ventricle. Cardiomegaly. No pulmonary venous congestion. No focal infiltrate. No pleural effusion or pneumothorax. Degenerative change thoracic spine. IMPRESSION: 1. AICD noted with tip over right atrium. Cardiomegaly. No pulmonary  venous congestion. 2.  No acute pulmonary disease. Electronically Signed   By: Maisie Fus  Register   On: 04/18/2018 13:46   Ct Lumbar Spine Wo Contrast  Result Date: 04/18/2018 CLINICAL DATA:  Fever and back pain. Steroid or other back injection yesterday. EXAM: CT LUMBAR SPINE WITHOUT CONTRAST TECHNIQUE: Multidetector CT imaging of the lumbar spine was performed without intravenous contrast administration. Multiplanar CT image reconstructions were also generated. COMPARISON:  01/03/2018 FINDINGS: Segmentation: Standard. Alignment: Mild chronic lumbar spine straightening. No significant listhesis. Vertebrae: No acute fracture or suspicious osseous lesion. No interval osseous erosion to suggest discitis-osteomyelitis. Paraspinal  and other soft tissues: Small locules of gas in the soft tissues posterior to the left L5 lamina, possibly related to the recent injection. No paraspinal fluid collection is evident on this unenhanced study. Left larger than right nonobstructing renal calculi. Extensive atherosclerosis of the abdominal aorta and its major branch vessels. Ectatic of infrarenal abdominal aorta. 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 Disc levels: T12-L1 and L1-2: Negative. L2-3: Disc bulging and facet hypertrophy result in at most mild bilateral neural foraminal stenosis without significant spinal stenosis, unchanged. L3-4: Disc bulging and moderate facet hypertrophy result in mild bilateral neural foraminal stenosis and borderline spinal stenosis, unchanged. L4-5: Chronic vacuum disc and disc space narrowing. Circumferential disc bulging, a broad right paracentral and subarticular disc protrusion, and mild facet hypertrophy result in asymmetric right lateral recess stenosis and mild-to-moderate bilateral neural foraminal stenosis without significant generalized spinal stenosis. The disc  protrusion may be slightly smaller with slightly improved right lateral recess patency. A new small focus of ventral epidural gas in the left lateral recess likely arises from the disc without a sizable soft tissue component identified. L5-S1: Mild disc bulging and asymmetric moderate right facet arthrosis with similar appearance of soft tissue anterior to the right facet joint which contributes to moderate right neural foraminal stenosis with right L5 nerve root contact, possibly a synovial cyst. No spinal stenosis. IMPRESSION: 1. No evidence of acute osseous abnormality or infection in the lumbar spine. 2. Similar appearance of lumbar disc and facet degeneration, perhaps with slightly decreased size of right-sided disc protrusion at L4-5. 3. Unchanged soft tissue anterior to the right L5-S1 facet joint, possibly a synovial cyst and resulting in right neural foraminal stenosis and potential right L5 nerve root impingement. 4.  Aortic Atherosclerosis (ICD10-I70.0). Electronically Signed   By: Sebastian Ache M.D.   On: 04/18/2018 16:13      IMPRESSION AND PLAN:   Sepsis due to unknown etiology-patient meeting sepsis criteria on admission with fever to 102.24F, tachycardia, and leukocytosis.  He just had lumbar spinal injections yesterday.  CT lumbar spine without any evidence of infection (unable to get MRI due to pacemaker).  UA unremarkable.  Chest x-ray without pneumonia. This may just be a steroid-induced fever/leukocytosis with tachycardia due to pain. Lactic acid normal. -Blood and urine cultures pending -Continue IV antibiotics -Neurosurgery consulted -Check procalcitonin  Chronic low back pain- s/p epidural lumbar steroid injections 04/17/2018 at pain clinic. Worsening low back pain starting this morning. May be steroid flare. -Neurosurgery consulted -Pain control  Hypokalemia- K 3.2 -Replete and recheck   Chronic systolic congestive heart failure/ischemic cardiomyopathy- most recent EF 25%.   Received a 500 cc bolus in the ED. No signs of volume overload -Will need to monitor volume status closely  CAD with history of multiple stents- stable, no active chest pain -Continue aspirin, plavix, metoprolol  Hypertension- normotensive in the ED -Continue home Norvasc, lisinopril, metoprolol  Hyperlipidemia-stable -Continue home Lipitor  Patient would like transfer to [facility], as he receives all his care there. EDP called [facility] and he has been placed on their wait list.  All the records are reviewed and case discussed with ED provider. Management plans discussed with the patient, family and they are in agreement.  CODE STATUS: Full  TOTAL TIME TAKING CARE OF THIS PATIENT: 45 minutes.    Jinny Blossom Anhad Sheeley M.D on 04/18/2018 at 5:09 PM  Between 7am to 6pm - Pager - [phone]  After 6pm go to [email or web address] - Social research officer, governmentpassword EPAS [facility]  [employer] [facility]  Office  [phone]  CC: Primary care physician; McLean-Scocuzza, Pasty Spillersracy N, MD   Note: This dictation was prepared with Dragon dictation along with smaller phrase technology. Any transcriptional errors that result from this process are unintentional.

## 2018-04-18 NOTE — ED Provider Notes (Addendum)
[facility] Emergency Department Provider Note  ____________________________________________   First MD Initiated Contact with Patient 04/18/18 1500     (approximate)  I have reviewed the triage vital signs and the nursing notes.   HISTORY  Chief Complaint Emesis and Fever   HPI Martin Hernandez is a 63 y.o. male with a history of CAD status post pacemaker, hyperlipidemia and headache was presented emergency department today complaining of low back pain as well as a fever.  He says that he is also had nausea and vomiting.  Denies abdominal pain.  Denies any cough or runny nose.  Denies any throat pain.  He states that he had injections into his lumbar spine yesterday to "kill nerves."  Ever since then he has been having severe pain especially with movement.  He says that the pain is sharp and radiating down his legs, bilaterally.  Denies any numbness.  Does not report a loss of bowel or bladder continence.  Says that usually he does not have any pain after the injections.  He states that when the pain becomes severe he also becomes short of breath.   Past Medical History:  Diagnosis Date  . Allergy   . CAD (coronary artery disease)   . Depression   . Frequent headaches   . GERD (gastroesophageal reflux disease)   . Headache   . Hyperlipidemia   . Hypertension   . PAD (peripheral artery disease) (HCC)   . Tobacco abuse     Patient Active Problem List   Diagnosis Date Noted  . Erectile dysfunction 02/27/2018  . Arthritis 02/27/2018  . Hematuria 02/27/2018  . Disorder of right eustachian tube 02/27/2018  . Vitamin D deficiency 02/27/2018  . Gastritis without bleeding 01/15/2018  . Chronic abdominal pain 01/15/2018  . Abnormal CT of the abdomen 02/25/2017  . Early satiety 02/25/2017  . Vision decreased 02/25/2017  . Cramping of hands 08/28/2016  . Prediabetes 06/12/2016  . Anxiety and depression 02/28/2016  . Bilateral lower abdominal pain  02/15/2016  . Change in bowel habits 02/15/2016  . Poor appetite 02/15/2016  . Unstable angina (HCC) 11/03/2015  . Essential hypertension 09/28/2015  . Weight loss 09/24/2015  . Chronic, continuous use of opioids 09/24/2015  . Irritable bowel syndrome with constipation 03/29/2015  . Status post abdominal aortic aneurysm repair 03/29/2015  . Urinary retention 03/29/2015  . Insomnia 12/07/2014  . Encounter for fitting or adjustment of automatic implantable cardioverter-defibrillator 06/30/2013  . Automatic implantable cardioverter-defibrillator in situ 03/10/2013  . Heart failure (HCC) 03/05/2013  . CHF (congestive heart failure), NYHA class III (HCC) 03/05/2013  . Non-sustained ventricular tachycardia (HCC) 02/20/2013  . Chronic low back pain 02/19/2012  . HLD (hyperlipidemia) 09/12/2011  . Current tobacco use 06/29/2011  . AAA (abdominal aortic aneurysm) (HCC) 06/27/2011  . Coronary artery disease involving native coronary artery of native heart without angina pectoris 06/27/2011  . GERD (gastroesophageal reflux disease) 06/27/2011  . Cardiomyopathy, ischemic 06/27/2011  . PAD (peripheral artery disease) (HCC) 06/27/2011  . Peripheral arterial occlusive disease (HCC) 06/27/2011    Past Surgical History:  Procedure Laterality Date  . COLONOSCOPY WITH PROPOFOL N/A 06/20/2017   Procedure: COLONOSCOPY WITH PROPOFOL;  Surgeon: Wyline MoodAnna, Kiran, MD;  Location: [facility] ENDOSCOPY;  Service: Gastroenterology;  Laterality: N/A;  . COLONOSCOPY WITH PROPOFOL N/A 07/24/2017   Procedure: COLONOSCOPY WITH PROPOFOL;  Surgeon: Wyline MoodAnna, Kiran, MD;  Location[facility] ENDOSCOPY;  Service: Gastroenterology;  Laterality: N/A;  . ESOPHAGOGASTRODUODENOSCOPY (EGD) WITH PROPOFOL N/A 06/20/2017  Procedure: ESOPHAGOGASTRODUODENOSCOPY (EGD) WITH PROPOFOL;  Surgeon: Wyline Mood, MD;  Location: [facility] ENDOSCOPY;  Service: Gastroenterology;  Laterality: N/A;  . PERCUTANEOUS CORONARY STENT INTERVENTION (PCI-S)    . STOMACH SURGERY      2009 [facility] per chart review pt had AAA repair   . TONSILLECTOMY      Prior to Admission medications   Medication Sig Start Date End Date Taking? Authorizing Provider  amLODipine (NORVASC) 2.5 MG tablet Take 2.5 mg by mouth daily.  01/16/18 01/16/19  [provider]  aspirin 81 MG tablet Take 81 mg by mouth daily.    [provider]  atorvastatin (LIPITOR) 80 MG tablet Take 1 tablet (80 mg total) by mouth daily at 6 PM. 02/20/18   McLean-Scocuzza, Pasty Spillers, MD  baclofen (LIORESAL) 10 MG tablet Take 0.5 tablets (5 mg total) by mouth 2 (two) times daily. 08/29/17   McLean-Scocuzza, Pasty Spillers, MD  carbamide peroxide (DEBROX) 6.5 % OTIC solution Place 5 drops into the right ear 2 (two) times daily. X 1 week 01/15/18   McLean-Scocuzza, Pasty Spillers, MD  cetirizine (ZYRTEC) 10 MG tablet Take 1 tablet (10 mg total) by mouth daily. At night 02/27/18   McLean-Scocuzza, Pasty Spillers, MD  Cholecalciferol (VITAMIN D3) 1000 units CAPS Take by mouth.    [provider]  Cholecalciferol 1.25 MG (50000 UT) capsule Take 1 capsule (50,000 Units total) by mouth once a week. 02/27/18   McLean-Scocuzza, Pasty Spillers, MD  citalopram (CELEXA) 10 MG tablet Take 1 tablet (10 mg total) by mouth daily. 08/08/16   Tommie Sams, DO  clopidogrel (PLAVIX) 75 MG tablet TAKE ONE (1) TABLET EACH DAY 07/30/17   McLean-Scocuzza, Pasty Spillers, MD  [facility] CITRATE OF MAGNESIA SOLN TAKE 296 MLS (1 BOTTLE TOTAL) BY MOUTH ONCE FOR 1 DOSE. 07/09/17   [provider]  cyanocobalamin 100 MCG tablet Take 100 mcg by mouth daily.    [provider]  fluticasone (FLONASE) 50 MCG/ACT nasal spray Place 1-2 sprays into both nostrils daily. 02/27/18   McLean-Scocuzza, Pasty Spillers, MD  folic acid (FOLVITE) 1 MG tablet Take 1 mg by mouth daily.    [provider]  gabapentin (NEURONTIN) 300 MG capsule  03/12/17   [provider]  ipratropium (ATROVENT) 0.02 % nebulizer solution Inhale into the lungs. 12/07/14   [provider]  isosorbide mononitrate (IMDUR) 30 MG 24 hr tablet Take 60 mg by mouth 2 (two) times daily.     [provider]  linaclotide Karlene Einstein) 145 MCG CAPS capsule Take 1 capsule (145 mcg total) by mouth daily before breakfast. 08/01/17 10/01/17  Wyline Mood, MD  lisinopril (PRINIVIL,ZESTRIL) 5 MG tablet 1 pill daily 02/20/18   McLean-Scocuzza, Pasty Spillers, MD  Melatonin 3 MG TABS Take 3 mg by mouth.    [provider]  metoprolol succinate (TOPROL-XL) 25 MG 24 hr tablet Take 1 tablet (25 mg total) by mouth daily. 02/20/18   McLean-Scocuzza, Pasty Spillers, MD  mirtazapine (REMERON) 30 MG tablet 1 pill nightly 02/20/18   McLean-Scocuzza, Pasty Spillers, MD  nitroGLYCERIN (NITROSTAT) 0.4 MG SL tablet Place 1 tablet (0.4 mg total) under the tongue every 5 (five) minutes as needed for chest pain. 11/09/17   McLean-Scocuzza, Pasty Spillers, MD  oxyCODONE-acetaminophen (PERCOCET) 10-325 MG tablet  05/18/16   [provider]  pantoprazole (PROTONIX) 40 MG tablet Take 1 tablet (40 mg total) by mouth daily. 30 minutes before food 01/15/18   McLean-Scocuzza, Pasty Spillers, MD  Polyethylene Glycol  3350 (PEG 3350) POWD Please take this medication 1 to 3 times daily so that you have bowel movements everyday. 03/31/15   [provider]  pyridOXINE (VITAMIN B-6) 100 MG tablet Take 1 tablet (100 mg total) by mouth daily. 08/29/17   McLean-Scocuzza, Pasty Spillersracy N, MD  sildenafil (REVATIO) 20 MG tablet 1-5 pills as needed before sex 30 minutes to 4 hours 02/27/18   McLean-Scocuzza, Pasty Spillersracy N, MD  thiamine 100 MG tablet Take by mouth.    [provider]  ZOHYDRO ER 15 MG C12A  02/13/17   [provider]  zolpidem (AMBIEN) 10 MG tablet Take 1 tablet (10 mg total) by mouth at bedtime as needed for sleep. 12/05/17   McLean-Scocuzza, Pasty Spillersracy N, MD    Allergies Patient has no known allergies.  Family History  Problem Relation Age of Onset  . Diabetes Mother   . Heart disease Mother   . Hypertension  Mother   . Stroke Mother   . Prostate cancer Father   . Cancer Father        prostate cancer in 8550s  . Diabetes Sister   . Heart disease Sister   . Ulcers Other        unknown who had ulcers in family     Social History Social History   Tobacco Use  . Smoking status: Current Every Day Smoker    Packs/day: 0.25    Types: Cigarettes  . Smokeless tobacco: Never Used  Substance Use Topics  . Alcohol use: No    Alcohol/week: 0.0 standard drinks    Comment: quit 1 year ago  . Drug use: No    Review of Systems  Constitutional: As above Eyes: No visual changes. ENT: No sore throat. Cardiovascular: Denies chest pain. Respiratory: As above Gastrointestinal: No abdominal pain.  No diarrhea.  No constipation. Genitourinary: Negative for dysuria. Musculoskeletal: As above Skin: Negative for rash. Neurological: Negative for headaches, focal weakness or numbness.   ____________________________________________   PHYSICAL EXAM:  VITAL SIGNS: ED Triage Vitals [04/18/18 1313]  Enc Vitals Group     BP 131/74     Pulse Rate 98     Resp 16     Temp (!) 102.6 F (39.2 C)     Temp Source Oral     SpO2 92 %     Weight 143 lb (64.9 kg)     Height 5\' 7"  (1.702 m)     Head Circumference      Peak Flow      Pain Score 7     Pain Loc      Pain Edu?      Excl. in GC?     Constitutional: Alert and oriented.  Patient appears uncomfortable, especially with movement. Eyes: Conjunctivae are normal.  Head: Atraumatic. Nose: No congestion/rhinnorhea. Mouth/Throat: Mucous membranes are moist.  Neck: No stridor.   Cardiovascular: Tachycardic, regular rhythm. Grossly normal heart sounds.   Respiratory: Normal respiratory effort.  No retractions. Lungs CTAB. Gastrointestinal: Soft and nontender. No distention.  Musculoskeletal: No lower extremity tenderness nor edema.  No joint effusions.  Severe tenderness to the lumbar region of the area of the injections.  There are punctate  marks, bilaterally that appear to be the injection sites.  Therefore these marks.  The patient is exquisitely tender over this region but without any fluctuance, erythema or exudate or swelling.  4-5 strength bilateral lower extremities which is limited to pain, bilaterally.  No saddle anesthesia.  Neurologic:  Normal speech  and language. No gross focal neurologic deficits are appreciated. Skin:  Skin is warm, dry and intact. No rash noted. Psychiatric: Mood and affect are normal. Speech and behavior are normal.  ____________________________________________   LABS (all labs ordered are listed, but only abnormal results are displayed)  Labs Reviewed  COMPREHENSIVE METABOLIC PANEL - Abnormal; Notable for the following components:      Result Value   Potassium 3.2 (*)    All other components within normal limits  CBC - Abnormal; Notable for the following components:   WBC 16.4 (*)    All other components within normal limits  URINALYSIS, COMPLETE (UACMP) WITH MICROSCOPIC - Abnormal; Notable for the following components:   Color, Urine YELLOW (*)    APPearance CLEAR (*)    Hgb urine dipstick SMALL (*)    All other components within normal limits  CULTURE, BLOOD (ROUTINE X 2)  CULTURE, BLOOD (ROUTINE X 2)  URINE CULTURE  LIPASE, BLOOD  TROPONIN I  INFLUENZA PANEL BY PCR (TYPE A & B)  I-STAT CG4 LACTIC ACID, ED  CG4 I-STAT (LACTIC ACID)   ____________________________________________  EKG  ED ECG REPORT I, Arelia Longest, the attending physician, personally viewed and interpreted this ECG.   Date: 04/18/2018  EKG Time: 1409  Rate: 97  Rhythm: normal sinus rhythm  Axis: Normal  Intervals:Left anterior fascicular block  ST&T Change: No ST segment elevation.  T wave inversions in V4 through 6 with 1 mm of depression in V4 and V5.  T wave inversions in 2, 3 and aVL.  EKG is changed from previous.  However, last EKG showed a  STEMI. ____________________________________________  RADIOLOGY  Chest x-ray with AICD with tip over the right atrium.  Cardiomegaly without pulmonary venous congestion.  CT of the lumbar spine without evidence of acute osseous abnormality or infection.  Similar appearance of lumbar disc and facet degeneration.  Possibly slightly decreased size of right-sided disc protrusion at L4-L5.  Unchanged soft tissue anterior to the right L5-S1 facet joint.  Possible synovial cyst and resulting in right neural foraminal stenosis. ____________________________________________   PROCEDURES  Procedure(s) performed:   .Critical Care Performed by: Myrna Blazer, MD Authorized by: Myrna Blazer, MD   Critical care provider statement:    Critical care time (minutes):  35   Critical care time was exclusive of:  Separately billable procedures and treating other patients   Critical care was necessary to treat or prevent imminent or life-threatening deterioration of the following conditions:  Sepsis   Critical care was time spent personally by me on the following activities:  Development of treatment plan with patient or surrogate, discussions with consultants, evaluation of patient's response to treatment, examination of patient, obtaining history from patient or surrogate, ordering and performing treatments and interventions, ordering and review of laboratory studies, ordering and review of radiographic studies, pulse oximetry, re-evaluation of patient's condition and review of old charts   Critical Care performed:    ____________________________________________   INITIAL IMPRESSION / ASSESSMENT AND PLAN / ED COURSE  Pertinent labs & imaging results that were available during my care of the patient were reviewed by me and considered in my medical decision making (see chart for details).  DDX: UTI, sepsis, cellulitis, epidural abscess, influenza, bacteremia As part of my medical  decision making, I reviewed the following data within the electronic MEDICAL RECORD NUMBER Notes from prior ED visits  ----------------------------------------- 4:16 PM on 04/18/2018 -----------------------------------------  Ordered lumbar spine noncontrast CT preliminarily.  Discussed  the case with the MRI tech and the patient will not be able to have an MRI here [facility] emergency department because of his pacemaker.  ----------------------------------------- 5:10 PM on 04/18/2018 -----------------------------------------  I discussed the case with Dr. Marcell Barlow of neurosurgery who reviewed the images and says that he does not see a cause for neurosurgical intervention at the patient should likely be treated as a case of a fever with unknown origin.  However, he did agree to evaluate the patient at the bedside.  Pending evaluation at this time.  Family, because of patient's cardiac history and cardiac care at [facility], is requesting transfer to [facility].  I discussed the case with the transfer center.  However, they stated they were on diversion and that they have quite a lengthy wait list.  They are paging out Dr. IV of the internal medicine service who may accept the patient onto the wait list.  Family advised that probably the best course of action will be to admit the patient to [facility] at this time and I will do what I can to get the patient accepted to the wait list at [facility].  They are agreeable with this plan.  Patient has received 2 doses of morphine at this time but he says it has not affected/decreased his pain.  However, the patient has a borderline blood pressure in the low 100s.  I will hold further medication at this time.  Patient receiving a fluid bolus of 500 cc currently.  Signed out to Dr. Elenore Rota.   ----------------------------------------- 6:04 PM on 04/18/2018 -----------------------------------------  Discussed the case with Dr. Cherrie Gauze, the admitting doctor at [facility] who  states that they have no beds at [facility] but there are beds at [facility].  Discussed this with the patient and family and the patient is opting to stay here at [facility].  Patient also evaluated by Dr. Marcell Barlow who does not see any indication for neurosurgical intervention.  We will continue with current plan. ____________________________________________   FINAL CLINICAL IMPRESSION(S) / ED DIAGNOSES  Back pain.  Fever.  Sepsis.  NEW MEDICATIONS STARTED DURING THIS VISIT:  New Prescriptions   No medications on file     Note:  This document was prepared using Dragon voice recognition software and may include unintentional dictation errors.     Myrna Blazer, MD 04/18/18 1712    Myrna Blazer, MD 04/18/18 [phone]

## 2018-04-18 NOTE — Progress Notes (Signed)
Pharmacy Antibiotic Note  Martin Hernandez is a 63 y.o. male admitted on 04/18/2018 with Unknown Source and fevers.  Pharmacy has been consulted for cefepime and vancomycin dosing.  Plan: Will start cefepime 2 g q12H.   Patient received vancomycin 1000 mg x 1 in the ED. Will give vancomycin 500 mg x 1 to make a total of 1500 mg.  Will start a maintenance dose of 1000 mg q12H. Predicted AUC 550. Goal AUC 400-550. Used Scr 0.8 for calculations. Plan to order levels on 4th or 5th day of therapy.   Vd 46.7, Ke 0.078, t1/2 8.9.   Height: 5\' 7"  (170.2 cm) Weight: 143 lb (64.9 kg) IBW/kg (Calculated) : 66.1  Temp (24hrs), Avg:102.6 F (39.2 C), Min:102.6 F (39.2 C), Max:102.6 F (39.2 C)  Recent Labs  Lab 04/18/18 1332 04/18/18 1644  WBC 16.4*  --   CREATININE 0.72  --   LATICACIDVEN  --  1.38    Estimated Creatinine Clearance: 86.8 mL/min (by C-G formula based on SCr of 0.72 mg/dL).    No Known Allergies  Antimicrobials this admission: 1/9 cefepime >>  1/9 vancomycin >>   Dose adjustments this admission: none  Microbiology results: 1/9 BCx: pending 1/9 UCx: pending   Thank you for allowing pharmacy to be a part of this patient's care.  Ronnald Ramp, PharmD, BCPS Clinical Pharmacist 04/18/2018 8:01 PM

## 2018-04-18 NOTE — Progress Notes (Signed)
CODE SEPSIS - PHARMACY COMMUNICATION  **Broad Spectrum Antibiotics should be administered within 1 hour of Sepsis diagnosis**  Time Code Sepsis Called/Page Received: 04/18/18 @1540   Antibiotics Ordered: cefepime and vancomycin  Time of 1st antibiotic administration: 04/18/2018 @1653    Additional action taken by pharmacy: Contacted RN at 1 hour mark   If necessary, Name of Provider/Nurse Contacted: Toney SangJennifer Whitley    Ronnald RampKishan S Rmani Kapusta ,PharmD, BCPS Clinical Pharmacist  04/18/2018  4:14 PM

## 2018-04-19 DIAGNOSIS — Z95810 Presence of automatic (implantable) cardiac defibrillator: Secondary | ICD-10-CM

## 2018-04-19 DIAGNOSIS — F17210 Nicotine dependence, cigarettes, uncomplicated: Secondary | ICD-10-CM

## 2018-04-19 DIAGNOSIS — I110 Hypertensive heart disease with heart failure: Secondary | ICD-10-CM

## 2018-04-19 DIAGNOSIS — I2510 Atherosclerotic heart disease of native coronary artery without angina pectoris: Secondary | ICD-10-CM

## 2018-04-19 DIAGNOSIS — M5136 Other intervertebral disc degeneration, lumbar region: Secondary | ICD-10-CM

## 2018-04-19 DIAGNOSIS — Z955 Presence of coronary angioplasty implant and graft: Secondary | ICD-10-CM

## 2018-04-19 DIAGNOSIS — I509 Heart failure, unspecified: Secondary | ICD-10-CM

## 2018-04-19 DIAGNOSIS — I714 Abdominal aortic aneurysm, without rupture: Secondary | ICD-10-CM

## 2018-04-19 DIAGNOSIS — R5082 Postprocedural fever: Secondary | ICD-10-CM

## 2018-04-19 DIAGNOSIS — Z7902 Long term (current) use of antithrombotics/antiplatelets: Secondary | ICD-10-CM

## 2018-04-19 DIAGNOSIS — A419 Sepsis, unspecified organism: Principal | ICD-10-CM

## 2018-04-19 LAB — CBC
HEMATOCRIT: 40.9 % (ref 39.0–52.0)
Hemoglobin: 13.1 g/dL (ref 13.0–17.0)
MCH: 29.4 pg (ref 26.0–34.0)
MCHC: 32 g/dL (ref 30.0–36.0)
MCV: 91.9 fL (ref 80.0–100.0)
Platelets: 218 10*3/uL (ref 150–400)
RBC: 4.45 MIL/uL (ref 4.22–5.81)
RDW: 15.8 % — ABNORMAL HIGH (ref 11.5–15.5)
WBC: 23.5 10*3/uL — ABNORMAL HIGH (ref 4.0–10.5)
nRBC: 0 % (ref 0.0–0.2)

## 2018-04-19 LAB — PROCALCITONIN: Procalcitonin: 13.56 ng/mL

## 2018-04-19 LAB — BASIC METABOLIC PANEL
Anion gap: 7 (ref 5–15)
BUN: 18 mg/dL (ref 8–23)
CO2: 26 mmol/L (ref 22–32)
Calcium: 8.3 mg/dL — ABNORMAL LOW (ref 8.9–10.3)
Chloride: 106 mmol/L (ref 98–111)
Creatinine, Ser: 0.94 mg/dL (ref 0.61–1.24)
GFR calc Af Amer: >60 mL/min (ref >60)
GFR calc non Af Amer: >60 mL/min (ref >60)
Glucose, Bld: 101 mg/dL — ABNORMAL HIGH (ref 70–99)
Potassium: 3.6 mmol/L (ref 3.5–5.1)
Sodium: 139 mmol/L (ref 135–145)

## 2018-04-19 MED ORDER — ISOSORBIDE MONONITRATE ER 30 MG PO TB24
30.0000 mg | ORAL_TABLET | Freq: Every day | ORAL | Status: DC
  Administered 2018-04-19: 30 mg via ORAL
  Filled 2018-04-19: qty 1

## 2018-04-19 MED ORDER — SODIUM CHLORIDE 0.9 % IV SOLN
INTRAVENOUS | Status: DC
  Administered 2018-04-19: 14:00:00 via INTRAVENOUS

## 2018-04-19 MED ORDER — VANCOMYCIN HCL 10 G IV SOLR
1500.0000 mg | INTRAVENOUS | Status: DC
  Administered 2018-04-20 – 2018-04-23 (×4): 1500 mg via INTRAVENOUS
  Filled 2018-04-19 (×4): qty 1500

## 2018-04-19 MED ORDER — SODIUM CHLORIDE 0.9 % IV SOLN
INTRAVENOUS | Status: DC | PRN
  Administered 2018-04-19: 250 mL via INTRAVENOUS

## 2018-04-19 MED ORDER — SODIUM CHLORIDE 0.9 % IV SOLN
2.0000 g | Freq: Two times a day (BID) | INTRAVENOUS | Status: DC
  Administered 2018-04-19 – 2018-04-23 (×9): 2 g via INTRAVENOUS
  Filled 2018-04-19 (×12): qty 2

## 2018-04-19 NOTE — Consult Note (Signed)
NAME: Martin Hernandez  DOB: March 01, 1956  MRN: [id number]  Date/Time: 04/19/2018 1:35 PM  Luberta Mutter Subjective:  REASON FOR CONSULT: fever after epidural injection ? Martin Hernandez is a 63 y.o. male with a history of coronary artery disease, is post DES to RCA and LCx hypertension, CHF, AICD in place AAA repair , DDD center to the ED on 04/18/2018 with with chills and temperature and low back pain.  Patient had an epidural steroid injection on 04/17/2017 at [facility] in [place].  That night he had severe pain on the left side at the site of the injection and then he started having chills and fever and he came to the ED. Had a temp of 102.6, BP 131/74, WBC was 16.4 Blood culture sent and started on vanco and cefepime. Ct lumbar spine without contrast did not show any acute changes. He was seen by neurosurgeon who asked , that he be worked up as FUO. Flu test was neg Pt denies any cough, sob, abdominal pain and diarrhea. He has no dysuria or difficulty in passing urine  Past Medical History:  Diagnosis Date  . Allergy   . CAD (coronary artery disease)   . Depression   . Frequent headaches   . GERD (gastroesophageal reflux disease)   . Headache   . Hyperlipidemia   . Hypertension   . PAD (peripheral artery disease) (HCC)   . Tobacco abuse   Surgical history AAA repair in 2009 Tonsillectomy   AICD  SH Lives with his wife Smoker 1/2 PPD  Family History  Problem Relation Age of Onset  . Diabetes Mother   . Heart disease Mother   . Hypertension Mother   . Stroke Mother   . Prostate cancer Father   . Cancer Father        prostate cancer in 36s  . Diabetes Sister   . Heart disease Sister   . Ulcers Other        unknown who had ulcers in family    No Known Allergies  ? Current Facility-Administered Medications  Medication Dose Route Frequency Provider Last Rate Last Dose  . 0.9 %  sodium chloride infusion   Intravenous Continuous Katha Hamming, MD      .  acetaminophen (TYLENOL) tablet 650 mg  650 mg Oral Q6H PRN Mayo, Allyn Kenner, MD       Or  . acetaminophen (TYLENOL) suppository 650 mg  650 mg Rectal Q6H PRN Mayo, Allyn Kenner, MD      . aspirin EC tablet 81 mg  81 mg Oral Daily Mayo, Allyn Kenner, MD   81 mg at 04/19/18 0948  . atorvastatin (LIPITOR) tablet 80 mg  80 mg Oral q1800 Mayo, Allyn Kenner, MD      . baclofen (LIORESAL) tablet 5 mg  5 mg Oral BID Mayo, Allyn Kenner, MD   5 mg at 04/19/18 0951  . ceFEPIme (MAXIPIME) 2 g in sodium chloride 0.9 % 100 mL IVPB  2 g Intravenous Q12H Albina Billet, RPH 200 mL/hr at 04/19/18 1111 2 g at 04/19/18 1111  . citalopram (CELEXA) tablet 10 mg  10 mg Oral Daily Mayo, Allyn Kenner, MD   10 mg at 04/19/18 0948  . clopidogrel (PLAVIX) tablet 75 mg  75 mg Oral Daily Mayo, Allyn Kenner, MD   75 mg at 04/19/18 0949  . enoxaparin (LOVENOX) injection 40 mg  40 mg Subcutaneous Q24H Mayo, Allyn Kenner, MD      . fluticasone [facility]) 50  MCG/ACT nasal spray 1-2 spray  1-2 spray Each Nare Daily Mayo, Allyn Kenner, MD   2 spray at 04/19/18 0950  . folic acid (FOLVITE) tablet 1 mg  1 mg Oral Daily Mayo, Allyn Kenner, MD   1 mg at 04/19/18 0949  . gabapentin (NEURONTIN) capsule 300 mg  300 mg Oral TID PRN Mayo, Allyn Kenner, MD      . HYDROmorphone (DILAUDID) injection 1 mg  1 mg Intravenous Q3H PRN Mayo, Allyn Kenner, MD   1 mg at 04/18/18 1822  . ipratropium (ATROVENT) nebulizer solution 0.5 mg  0.5 mg Nebulization TID PRN Mayo, Allyn Kenner, MD      . loratadine (CLARITIN) tablet 10 mg  10 mg Oral Daily Mayo, Allyn Kenner, MD   10 mg at 04/19/18 0947  . Melatonin TABS 2.5 mg  2.5 mg Oral QHS PRN Mayo, Allyn Kenner, MD      . metoprolol succinate (TOPROL-XL) 24 hr tablet 25 mg  25 mg Oral Daily Mayo, Allyn Kenner, MD      . mirtazapine (REMERON) tablet 30 mg  30 mg Oral QHS Mayo, Allyn Kenner, MD   30 mg at 04/19/18 0002  . ondansetron (ZOFRAN) tablet 4 mg  4 mg Oral Q6H PRN Mayo, Allyn Kenner, MD       Or  . ondansetron [facility]) injection 4 mg  4 mg  Intravenous Q6H PRN Mayo, Allyn Kenner, MD      . oxyCODONE-acetaminophen (PERCOCET/ROXICET) 5-325 MG per tablet 1 tablet  1 tablet Oral Q6H PRN Mayo, Allyn Kenner, MD   1 tablet at 04/19/18 0947   And  . oxyCODONE (Oxy IR/ROXICODONE) immediate release tablet 5 mg  5 mg Oral Q6H PRN Mayo, Allyn Kenner, MD   5 mg at 04/19/18 [phone]  . pantoprazole (PROTONIX) EC tablet 40 mg  40 mg Oral Daily Mayo, Allyn Kenner, MD   40 mg at 04/19/18 0948  . polyethylene glycol (MIRALAX / GLYCOLAX) packet 17 g  17 g Oral Daily PRN Mayo, Allyn Kenner, MD      . thiamine (VITAMIN B-1) tablet 100 mg  100 mg Oral Daily Mayo, Allyn Kenner, MD   100 mg at 04/19/18 0949  . vancomycin (VANCOCIN) IVPB 1000 mg/200 mL premix  1,000 mg Intravenous Q12H Ronnald Ramp, RPH 200 mL/hr at 04/19/18 1006 1,000 mg at 04/19/18 1006  . vitamin B-12 (CYANOCOBALAMIN) tablet 100 mcg  100 mcg Oral Daily Mayo, Allyn Kenner, MD   100 mcg at 04/19/18 9604  . zolpidem (AMBIEN) tablet 10 mg  10 mg Oral QHS PRN Mayo, Allyn Kenner, MD         Abtx:  Anti-infectives (From admission, onward)   Start     Dose/Rate Route Frequency Ordered Stop   04/19/18 1000  ceFEPIme (MAXIPIME) 2 g in sodium chloride 0.9 % 100 mL IVPB     2 g 200 mL/hr over 30 Minutes Intravenous Every 12 hours 04/19/18 0811     04/19/18 0900  vancomycin (VANCOCIN) IVPB 1000 mg/200 mL premix     1,000 mg 200 mL/hr over 60 Minutes Intravenous Every 12 hours 04/18/18 2010     04/18/18 2100  vancomycin (VANCOCIN) 500 mg in sodium chloride 0.9 % 100 mL IVPB     500 mg 100 mL/hr over 60 Minutes Intravenous  Once 04/18/18 2005 04/19/18 0314   04/18/18 1545  ceFEPIme (MAXIPIME) 2 g in sodium chloride 0.9 % 100 mL IVPB     2 g 200 mL/hr over  30 Minutes Intravenous  Once 04/18/18 1534 04/18/18 1725   04/18/18 1545  metroNIDAZOLE (FLAGYL) IVPB 500 mg  Status:  Discontinued     500 mg 100 mL/hr over 60 Minutes Intravenous Every 8 hours 04/18/18 1534 04/18/18 2234   04/18/18 1545  vancomycin (VANCOCIN) IVPB  1000 mg/200 mL premix     1,000 mg 200 mL/hr over 60 Minutes Intravenous  Once 04/18/18 1534 04/18/18 1904      REVIEW OF SYSTEMS:  Const:  fever,  chills, negative weight loss Eyes: negative diplopia or visual changes, negative eye pain ENT: negative coryza, negative sore throat Resp: negative cough, hemoptysis, dyspnea Cards: negative for chest pain, palpitations, lower extremity edema GU: negative for frequency, dysuria and hematuria GI: Negative for abdominal pain, diarrhea, bleeding, constipation Skin: negative for rash and pruritus Heme: negative for easy bruising and gum/nose bleeding VH:QIONGEXBS:myalgias,, has  back pain and muscle weakness Neurolo:negative for headaches, dizziness, vertigo, memory problems  Psych: negative for feelings of anxiety, depression  Endocrine: no polyuria or polydipsia Allergy/Immunology- none Objective:  VITALS:  BP 94/66 (BP Location: Right Arm)   Pulse 90   Temp 98.8 F (37.1 C) (Oral)   Resp 14   Ht 5\' 7"  (1.702 m)   Wt 64.9 kg   SpO2 94%   BMI 22.40 kg/m  PHYSICAL EXAM:  General: Alert, cooperative, no distress, chronically ill , pale appears stated age.  Head: Normocephalic, without obvious abnormality, atraumatic. Eyes: Conjunctivae clear, anicteric sclerae. Pupils are equal ENT Nares normal. No drainage or sinus tenderness. Lips, mucosa, and tongue normal. No Thrush Neck: Supple, symmetrical, no adenopathy, thyroid: non tender no carotid bruit and no JVD. Back: severe tenderness at the site of the injection on the left side of the lumbar spine Lungs:b/l air entry Heart: Regular rate and rhythm, no murmur, rub or gallop. Abdomen: lap scar,Soft, non-tender,not distended. Bowel sounds normal. No masses Extremities: atraumatic, no cyanosis. No edema. No clubbing Skin: No rashes or lesions. Or bruising Lymph: Cervical, supraclavicular normal. Neurologic: Grossly non-focal Pertinent Labs Lab Results CBC CBC Latest Ref Rng & Units  04/19/2018 04/18/2018 05/22/2017  WBC 4.0 - 10.5 K/uL 23.5(H) 16.4(H) 6.0  Hemoglobin 13.0 - 17.0 g/dL 28.413.1 13.214.2 44.014.0  Hematocrit 39.0 - 52.0 % 40.9 44.1 43.1  Platelets 150 - 400 K/uL 218 235 295.0      Component Value Date/Time   WBC 23.5 (H) 04/19/2018 0321   RBC 4.45 04/19/2018 0321   HGB 13.1 04/19/2018 0321   HCT 40.9 04/19/2018 0321   PLT 218 04/19/2018 0321   MCV 91.9 04/19/2018 0321   MCH 29.4 04/19/2018 0321   MCHC 32.0 04/19/2018 0321   RDW 15.8 (H) 04/19/2018 0321   LYMPHSABS 1.9 05/22/2017 0836   MONOABS 0.6 05/22/2017 0836   EOSABS 0.1 05/22/2017 0836   BASOSABS 0.0 05/22/2017 0836    CMP Latest Ref Rng & Units 04/19/2018 04/18/2018 08/01/2017  Glucose 70 - 99 mg/dL 102(V101(H) 86 25(D60(L)  BUN 8 - 23 mg/dL 18 18 17   Creatinine 0.61 - 1.24 mg/dL 6.640.94 4.030.72 4.740.96  Sodium 135 - 145 mmol/L 139 137 142  Potassium 3.5 - 5.1 mmol/L 3.6 3.2(L) 3.4(L)  Chloride 98 - 111 mmol/L 106 104 108  CO2 22 - 32 mmol/L 26 23 28   Calcium 8.9 - 10.3 mg/dL 8.3(L) 9.0 8.5(L)  Total Protein 6.5 - 8.1 g/dL - 7.6 -  Total Bilirubin 0.3 - 1.2 mg/dL - 0.5 -  Alkaline Phos 38 - 126 U/L - 51 -  AST 15 - 41 U/L - 27 -  ALT 0 - 44 U/L - 17 -      Microbiology: Recent Results (from the past 240 hour(s))  Blood Culture (routine x 2)     Status: None (Preliminary result)   Collection Time: 04/18/18  4:35 PM  Result Value Ref Range Status   Specimen Description BLOOD BLOOD RIGHT FOREARM  Final   Special Requests   Final    BOTTLES DRAWN AEROBIC AND ANAEROBIC Blood Culture results may not be optimal due to an inadequate volume of blood received in culture bottles   Culture   Final    NO GROWTH < 12 HOURS Performed at [facility], [address], [place], [place] [address]    Report Status PENDING  Incomplete  Blood Culture (routine x 2)     Status: None (Preliminary result)   Collection Time: 04/18/18  4:45 PM  Result Value Ref Range Status   Specimen Description BLOOD LEFT ANTECUBITAL   Final   Special Requests   Final    BOTTLES DRAWN AEROBIC AND ANAEROBIC Blood Culture adequate volume   Culture   Final    NO GROWTH < 12 HOURS Performed at [facility], [address], [place], [place] [address]    Report Status PENDING  Incomplete   IMAGING RESULTS: ?  CT lumbar spine No evidence of acute osseous abnormality or infection in the lumbar spine. 2. Similar appearance of lumbar disc and facet degeneration, perhaps with slightly decreased size of right-sided disc protrusion at L4-5   Impression/Recommendation ?63 y.o. male with a history of coronary artery disease, is post DES to RCA and LCx hypertension, CHF, AICD in place AAA repair , DDD center to the ED on 04/18/2018 with with chills and temperature and low back pain.  Patient had an epidural steroid injection on 04/17/2017 at [facility] in [place].  That night he had severe pain on the left side at the site of the injection and then he started having chills and fever and he came to the ED.   Fever with sepsis : high procal and leuccytosis within 12 hours of epidural injection- cocnern for infection secondary to the procedure.R/o bacterial infection with staph, strep or skin organisms-  eventhought CT was okay it was without contrast-will need contrast CT or MRI to look for early discitis, abscess collection  Continue vanco and cefepime and follow blood culture No evidence of other source for fever like flu, UTI, pneumonia Has AICD and no evidence of POCKET infection- will need 2 d echo  ? ?CAD s/p stent on plavix   ___________________________________________________ Discussed with patient,and his wife

## 2018-04-19 NOTE — Progress Notes (Addendum)
Pharmacy Antibiotic Note  Martin Hernandez is a 63 y.o. male admitted on 04/18/2018 with Unknown Source and fevers.  Pharmacy has been consulted for cefepime and vancomycin dosing.  Plan: Continue cefepime 2 g q12H.    Changing vancomycin dosing based on changing renal function (Scr 0.72>>0.94)  Previous dosing 1g q12 with predicted AUC 550 based on Scr 0.8 (Last dose 1/10 @ 1000)  Goal AUC 400-550   New dose 1500mg  q24h with predicted AUC 487 based on Scr 0.94 (First dose 1/11 @ 0600)  Continue to monitor cultures for antibiotic de-escalation and Scr (0.8>>0.94) for acute kidney function changes that may warrant further dosing adjustment.   Height: 5\' 7"  (170.2 cm) Weight: 143 lb (64.9 kg) IBW/kg (Calculated) : 66.1  Temp (24hrs), Avg:99.9 F (37.7 C), Min:97.5 F (36.4 C), Max:102.6 F (39.2 C)  Recent Labs  Lab 04/18/18 1332 04/18/18 1644 04/19/18 0321  WBC 16.4*  --  23.5*  CREATININE 0.72  --  0.94  LATICACIDVEN  --  1.38  --     Estimated Creatinine Clearance: 73.8 mL/min (by C-G formula based on SCr of 0.94 mg/dL).    No Known Allergies  Antimicrobials this admission: 1/9 cefepime >>  1/9 vancomycin >>   Dose adjustments this admission: 1/10 Dose adjusted from 1g q12 to 1500mg  q24 (per decreased renal fxn Scr 0.72 >>0.94)  Microbiology results: 1/9 BCx: pending 1/9 UCx: pending   Thank you for allowing pharmacy to be a part of this patient's care.  Albina Billet, PharmD, BCPS Clinical Pharmacist 04/19/2018 7:36 AM

## 2018-04-19 NOTE — Progress Notes (Signed)
[facility] [place] [facility]   PATIENT NAME: Martin LatinoLindsay Hernandez    MR#:  [id number]  DATE OF BIRTH:  [birth year]  SUBJECTIVE: Admitted for fever, sepsis.  Patient developed a fever, severe back pain after epidural steroid injection that he got yesterday.  Has low-grade fever, significant back pain on the left side.  Has some cough but no shortness of breath.  [facility] and would like to stay here.  CHIEF COMPLAINT:   Chief Complaint  Patient presents with  . Emesis  . Fever  Ill has low-grade fever up to 100 Fahrenheit, has significant low back pain  REVIEW OF SYSTEMS:   ROS CONSTITUTIONAL:  low-grade temperature, low back pain eYES: No blurred or double vision.  EARS, NOSE, AND THROAT: No tinnitus or ear pain.  RESPIRATORY: Some cough but no shortness of breath or wheezing. CARDIOVASCULAR: No chest pain, orthopnea, edema.  GASTROINTESTINAL: No nausea, vomiting, diarrhea or abdominal pain.  GENITOURINARY: No dysuria, hematuria.  ENDOCRINE: No polyuria, nocturia,  HEMATOLOGY: No anemia, easy bruising or bleeding SKIN: No rash or lesion. MUSCULOSKELETAL: Low back pain. NEUROLOGIC: No tingling, numbness, weakness.  PSYCHIATRY: No anxiety or depression.   DRUG ALLERGIES:  No Known Allergies  VITALS:  Blood pressure 94/66, pulse 90, temperature 98.8 F (37.1 C), temperature source Oral, resp. rate 14, height 5\' 7"  (1.702 m), weight 64.9 kg, SpO2 94 %.  PHYSICAL EXAMINATION:  GENERAL:  63 y.o.-year-old patient lying in the bed with no acute distress.  EYES: Pupils equal, round, reactive to light and accommodation. No scleral icterus. Extraocular muscles intact.  HEENT: Head atraumatic, normocephalic. Oropharynx and nasopharynx clear.  NECK:  Supple, no jugular venous distention. No thyroid enlargement, no tenderness.  LUNGS: Normal breath sounds bilaterally, no wheezing, rales,rhonchi or crepitation. No use of  accessory muscles of respiration.  CARDIOVASCULAR: S1, S2 normal. No murmurs, rubs, or gallops.  ABDOMEN: Soft, nontender, nondistended. Bowel sounds present. No organomegaly or mass.  EXTREMITIES: No pedal edema, cyanosis, or clubbing.  NEUROLOGIC: Cranial nerves II through XII are intact. Muscle strength 5/5 in all extremities. Sensation intact. Gait not checked.  Patient has tenderness to palpation right lumbar area. PSYCHIATRIC: The patient is alert and oriented x 3.  SKIN: No obvious rash, lesion, or ulcer.    LABORATORY PANEL:   CBC Recent Labs  Lab 04/19/18 0321  WBC 23.5*  HGB 13.1  HCT 40.9  PLT 218   ------------------------------------------------------------------------------------------------------------------  Chemistries  Recent Labs  Lab 04/18/18 1332 04/19/18 0321  NA 137 139  K 3.2* 3.6  CL 104 106  CO2 23 26  GLUCOSE 86 101*  BUN 18 18  CREATININE 0.72 0.94  CALCIUM 9.0 8.3*  AST 27  --   ALT 17  --   ALKPHOS 51  --   BILITOT 0.5  --    ------------------------------------------------------------------------------------------------------------------  Cardiac Enzymes Recent Labs  Lab 04/18/18 1332  TROPONINI <0.03   ------------------------------------------------------------------------------------------------------------------  RADIOLOGY:  Dg Chest 2 View  Result Date: 04/18/2018 CLINICAL DATA:  Cough.  Fever. EXAM: CHEST - 2 VIEW COMPARISON:  CT 08/16/2017. FINDINGS: AICD noted with tip over right ventricle. Cardiomegaly. No pulmonary venous congestion. No focal infiltrate. No pleural effusion or pneumothorax. Degenerative change thoracic spine. IMPRESSION: 1. AICD noted with tip over right atrium. Cardiomegaly. No pulmonary venous congestion. 2.  No acute pulmonary disease. Electronically Signed   By: Maisie Fushomas  Register   On: 04/18/2018 13:46   Ct Lumbar Spine Wo  Contrast  Result Date: 04/18/2018 CLINICAL DATA:  Fever and back pain. Steroid  or other back injection yesterday. EXAM: CT LUMBAR SPINE WITHOUT CONTRAST TECHNIQUE: Multidetector CT imaging of the lumbar spine was performed without intravenous contrast administration. Multiplanar CT image reconstructions were also generated. COMPARISON:  01/03/2018 FINDINGS: Segmentation: Standard. Alignment: Mild chronic lumbar spine straightening. No significant listhesis. Vertebrae: No acute fracture or suspicious osseous lesion. No interval osseous erosion to suggest discitis-osteomyelitis. Paraspinal and other soft tissues: Small locules of gas in the soft tissues posterior to the left L5 lamina, possibly related to the recent injection. No paraspinal fluid collection is evident on this unenhanced study. Left larger than right nonobstructing renal calculi. Extensive atherosclerosis of the abdominal aorta and its major branch vessels. Ectatic of infrarenal abdominal aorta. 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 Disc levels: T12-L1 and L1-2: Negative. L2-3: Disc bulging and facet hypertrophy result in at most mild bilateral neural foraminal stenosis without significant spinal stenosis, unchanged. L3-4: Disc bulging and moderate facet hypertrophy result in mild bilateral neural foraminal stenosis and borderline spinal stenosis, unchanged. L4-5: Chronic vacuum disc and disc space narrowing. Circumferential disc bulging, a broad right paracentral and subarticular disc protrusion, and mild facet hypertrophy result in asymmetric right lateral recess stenosis and mild-to-moderate bilateral neural foraminal stenosis without significant generalized spinal stenosis. The disc protrusion may be slightly smaller with slightly improved right lateral recess patency. A new small focus of ventral epidural gas in the left lateral recess likely arises from the disc without a sizable soft tissue component  identified. L5-S1: Mild disc bulging and asymmetric moderate right facet arthrosis with similar appearance of soft tissue anterior to the right facet joint which contributes to moderate right neural foraminal stenosis with right L5 nerve root contact, possibly a synovial cyst. No spinal stenosis. IMPRESSION: 1. No evidence of acute osseous abnormality or infection in the lumbar spine. 2. Similar appearance of lumbar disc and facet degeneration, perhaps with slightly decreased size of right-sided disc protrusion at L4-5. 3. Unchanged soft tissue anterior to the right L5-S1 facet joint, possibly a synovial cyst and resulting in right neural foraminal stenosis and potential right L5 nerve root impingement. 4.  Aortic Atherosclerosis (ICD10-I70.0). Electronically Signed   By: Sebastian Ache M.D.   On: 04/18/2018 16:13    EKG:   Orders placed or performed during the hospital encounter of 04/18/18  . ED EKG  . ED EKG  . ED EKG 12-Lead  . ED EKG 12-Lead    ASSESSMENT AND PLAN:   63 year old male patient with history of hypertension, CAD, PCI, history of AICD, pacemaker placement for history of ischemic cardiomyopathy, follows with [facility] cardiology comes in with fever which developed after general steroid injection. 1.  Sepsis present on admission with elevated lactic acid, elevated procalcitonin, unclear source at this time, chest x-ray negative, UA negative for infection, blood cultures are pending, get ID consult for evaluation of sepsis.  Patient has elevated white count, elevated procalcitonin, continue Vanco and cefepime, severe low back pain, fever after epidural steroid injection, CT of lumbosacral spine on admission did not show acute infection.  #2 history of CAD, NSVT, ischemic cardiomyopathy with EF 20%, history of PCI, stent placement, follows up with [facility] cardiology, patient has history of AAA repair in 2016, has history of pacemaker, defibrillator placement, patient refused transfer to [facility], get ID consult  here  Continue aspirin, Plavix, hold lisinopril, metoprolol at this time because of hypotension.  Continue high-dose statins. #3/history of ischemic cardiomyopathy, patient started on isosorbide 60 mg twice daily recently 3 months ago by cardiology to help with coronary vasospasm.  #4 GERD: Continue PPI 5.  Chronic congestive heart failure, right now he has sepsis so continue gentle hydration.  Hold Lasix.  Appears dry. 6.  Chronic pain, narcotic seeking behavior as per the charts: Patient is already on Dilaudid 1 mg every 3 hours, Percocet every 6 hours, unable to increase or adjust pain medicine at this time. All the records are reviewed and case discussed with Care Management/Social Workerr. Management plans discussed with the patient, family and they are in agreement.  CODE STATUS: Full code  TOTAL TIME TAKING CARE OF THIS PATIENT: 38 minutes.   POSSIBLE D/C IN3-4 DAYS, DEPENDING ON CLINICAL CONDITION. More than 50% time spent in counseling, coordination of care  Katha Hamming M.D on 04/19/2018 at 12:32 PM  Between 7am to 6pm - Pager - [phone]  After 6pm go to [email or web address] - password [email or web address]  [facility]  Office  [phone]  CC: Primary care physician; McLean-Scocuzza, Pasty Spillers, MD   Note: This dictation was prepared with Dragon dictation along with smaller phrase technology. Any transcriptional errors that result from this process are unintentional.

## 2018-04-19 NOTE — Progress Notes (Signed)
Pt refused transfer to [facility]

## 2018-04-20 LAB — URINE CULTURE: Culture: NO GROWTH

## 2018-04-20 LAB — TROPONIN I
TROPONIN I: 0.03 ng/mL — AB (ref <0.03)
Troponin I: 0.03 ng/mL (ref <0.03)
Troponin I: 0.04 ng/mL (ref <0.03)

## 2018-04-20 LAB — CBC
HCT: 39.7 % (ref 39.0–52.0)
Hemoglobin: 12.5 g/dL — ABNORMAL LOW (ref 13.0–17.0)
MCH: 29.2 pg (ref 26.0–34.0)
MCHC: 31.5 g/dL (ref 30.0–36.0)
MCV: 92.8 fL (ref 80.0–100.0)
Platelets: 208 10*3/uL (ref 150–400)
RBC: 4.28 MIL/uL (ref 4.22–5.81)
RDW: 15.9 % — AB (ref 11.5–15.5)
WBC: 15.9 10*3/uL — ABNORMAL HIGH (ref 4.0–10.5)
nRBC: 0 % (ref 0.0–0.2)

## 2018-04-20 LAB — CREATININE, SERUM
Creatinine, Ser: 0.83 mg/dL (ref 0.61–1.24)
GFR calc Af Amer: >60 mL/min (ref >60)
GFR calc non Af Amer: >60 mL/min (ref >60)

## 2018-04-20 MED ORDER — NITROGLYCERIN 0.4 MG SL SUBL
0.4000 mg | SUBLINGUAL_TABLET | SUBLINGUAL | Status: DC | PRN
  Administered 2018-04-20: 0.4 mg via SUBLINGUAL
  Filled 2018-04-20: qty 1

## 2018-04-20 MED ORDER — VITAMIN B-6 50 MG PO TABS
100.0000 mg | ORAL_TABLET | Freq: Every day | ORAL | Status: DC
  Administered 2018-04-21 – 2018-04-23 (×3): 100 mg via ORAL
  Filled 2018-04-20 (×4): qty 2

## 2018-04-20 MED ORDER — ISOSORBIDE MONONITRATE ER 30 MG PO TB24
60.0000 mg | ORAL_TABLET | Freq: Two times a day (BID) | ORAL | Status: DC
  Administered 2018-04-20 – 2018-04-23 (×6): 60 mg via ORAL
  Filled 2018-04-20 (×6): qty 2

## 2018-04-20 MED ORDER — ISOSORBIDE MONONITRATE ER 30 MG PO TB24
60.0000 mg | ORAL_TABLET | Freq: Every day | ORAL | Status: DC
  Administered 2018-04-20: 60 mg via ORAL
  Filled 2018-04-20: qty 2

## 2018-04-20 NOTE — Progress Notes (Addendum)
Pt and wife requesting transfer to [facility]. Pt and wife unsatisfied with care here. Offered AMA. Wife states EMS should have taken them to [facility]. Educated that EMS has to take to nearest facility. Wife adamant that is a lie. Attempted to explain transfer process. Wife and pt are belligerent and rude to staff. Offered pt AMA form. Pt and wife refused.  EKG ordered overnight. Pt now c/o chest pain. EKG performed overnight. Paged MD.

## 2018-04-20 NOTE — Progress Notes (Signed)
Patient's BP has been low intermittently therefore his MD held some of his cardiac meds as well as decreased his Imdur. Patient called @ about [phone] requesting his cardiac meds because he said he had began to sweat, and his cardiaologist said when that happens, he should take his Imdur. This Clinical research associate asked patient if he was experiencing chest pain and he replied no.O2 was applied anyway, vital signs performed as well as EKG was done.  Dr. Sheryle Hail was notified and cardiologist consult was ordered for this AM. Patient was again asked if he had any pain and he stated he had bilateral hip pain.Oxy 5 mg was given @ 0401.

## 2018-04-20 NOTE — Progress Notes (Signed)
[facility] [place] at [facility]   PATIENT NAME: Martin Hernandez    MR#:  [id number]  DATE OF BIRTH:  Sep 01, 1955  Admitted for sepsis, WBC improving, developed  fever after spinal steroid injection.  Started on vancomycin, cefepime, WBC this morning better.  But developed chest pain last night with sweating, EKG showed more pronounced T wave inversions in V2, V3, .  .  No further chest pain after getting Imdur this morning.  wanted to go to [facility] but [facility] declined to take the patient back.  CHIEF COMPLAINT:   Chief Complaint  Patient presents with  . Emesis  . Fever  Ill has low-grade fever up to 100 Fahrenheit, has significant low back pa same explained that patient in  REVIEW OF SYSTEMS:   ROS CONSTITUTIONAL:  no fever, back pain improved. eYES: No blurred or double vision.  EARS, NOSE, AND THROAT: No tinnitus or ear pain.  RESPIRATORY: Some cough but no shortness of breath or wheezing. CARDIOVASCULAR: Chest pain, sweating last night GASTROINTESTINAL: No nausea, vomiting, diarrhea or abdominal pain.  GENITOURINARY: No dysuria, hematuria.  ENDOCRINE: No polyuria, nocturia,  HEMATOLOGY: No anemia, easy bruising or bleeding SKIN: No rash or lesion. MUSCULOSKELETAL: Low back pain. NEUROLOGIC: No tingling, numbness, weakness.  PSYCHIATRY: No anxiety or depression.   DRUG ALLERGIES:  No Known Allergies  VITALS:  Blood pressure 120/79, pulse 65, temperature 97.8 F (36.6 C), temperature source Oral, resp. rate (!) 24, height 5\' 7"  (1.702 m), weight 64.9 kg, SpO2 99 %.  PHYSICAL EXAMINATION:  GENERAL:  63 y.o.-year-old patient lying in the bed with no acute distress.  EYES: Pupils equal, round, reactive to light and accommodation. No scleral icterus. Extraocular muscles intact.  HEENT: Head atraumatic, normocephalic. Oropharynx and nasopharynx clear.  NECK:  Supple, no jugular venous distention. No thyroid enlargement, no tenderness.  LUNGS: Normal  breath sounds bilaterally, no wheezing, rales,rhonchi or crepitation. No use of accessory muscles of respiration.  CARDIOVASCULAR: S1, S2 normal. No murmurs, rubs, or gallops.  ABDOMEN: Soft, nontender, nondistended. Bowel sounds present. No organomegaly or mass.  EXTREMITIES: No pedal edema, cyanosis, or clubbing.  NEUROLOGIC: Cranial nerves II through XII are intact. Muscle strength 5/5 in all extremities. Sensation intact. Gait not checked.  Patient has tenderness to palpation right lumbar area. PSYCHIATRIC: The patient is alert and oriented x 3.  SKIN: No obvious rash, lesion, or ulcer.    LABORATORY PANEL:   CBC Recent Labs  Lab 04/20/18 0337  WBC 15.9*  HGB 12.5*  HCT 39.7  PLT 208   ------------------------------------------------------------------------------------------------------------------  Chemistries  Recent Labs  Lab 04/18/18 1332 04/19/18 0321 04/20/18 0337  NA 137 139  --   K 3.2* 3.6  --   CL 104 106  --   CO2 23 26  --   GLUCOSE 86 101*  --   BUN 18 18  --   CREATININE 0.72 0.94 0.83  CALCIUM 9.0 8.3*  --   AST 27  --   --   ALT 17  --   --   ALKPHOS 51  --   --   BILITOT 0.5  --   --    ------------------------------------------------------------------------------------------------------------------  Cardiac Enzymes Recent Labs  Lab 04/20/18 0849  TROPONINI 0.03*   ------------------------------------------------------------------------------------------------------------------  RADIOLOGY:  Dg Chest 2 View  Result Date: 04/18/2018 CLINICAL DATA:  Cough.  Fever. EXAM: CHEST - 2 VIEW COMPARISON:  CT 08/16/2017. FINDINGS: AICD noted with tip over right ventricle. Cardiomegaly. No pulmonary  venous congestion. No focal infiltrate. No pleural effusion or pneumothorax. Degenerative change thoracic spine. IMPRESSION: 1. AICD noted with tip over right atrium. Cardiomegaly. No pulmonary venous congestion. 2.  No acute pulmonary disease. Electronically  Signed   By: Maisie Fus  Register   On: 04/18/2018 13:46   Ct Lumbar Spine Wo Contrast  Result Date: 04/18/2018 CLINICAL DATA:  Fever and back pain. Steroid or other back injection yesterday. EXAM: CT LUMBAR SPINE WITHOUT CONTRAST TECHNIQUE: Multidetector CT imaging of the lumbar spine was performed without intravenous contrast administration. Multiplanar CT image reconstructions were also generated. COMPARISON:  01/03/2018 FINDINGS: Segmentation: Standard. Alignment: Mild chronic lumbar spine straightening. No significant listhesis. Vertebrae: No acute fracture or suspicious osseous lesion. No interval osseous erosion to suggest discitis-osteomyelitis. Paraspinal and other soft tissues: Small locules of gas in the soft tissues posterior to the left L5 lamina, possibly related to the recent injection. No paraspinal fluid collection is evident on this unenhanced study. Left larger than right nonobstructing renal calculi. Extensive atherosclerosis of the abdominal aorta and its major branch vessels. Ectatic of infrarenal abdominal aorta. 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 Disc levels: T12-L1 and L1-2: Negative. L2-3: Disc bulging and facet hypertrophy result in at most mild bilateral neural foraminal stenosis without significant spinal stenosis, unchanged. L3-4: Disc bulging and moderate facet hypertrophy result in mild bilateral neural foraminal stenosis and borderline spinal stenosis, unchanged. L4-5: Chronic vacuum disc and disc space narrowing. Circumferential disc bulging, a broad right paracentral and subarticular disc protrusion, and mild facet hypertrophy result in asymmetric right lateral recess stenosis and mild-to-moderate bilateral neural foraminal stenosis without significant generalized spinal stenosis. The disc protrusion may be slightly smaller with slightly improved right lateral  recess patency. A new small focus of ventral epidural gas in the left lateral recess likely arises from the disc without a sizable soft tissue component identified. L5-S1: Mild disc bulging and asymmetric moderate right facet arthrosis with similar appearance of soft tissue anterior to the right facet joint which contributes to moderate right neural foraminal stenosis with right L5 nerve root contact, possibly a synovial cyst. No spinal stenosis. IMPRESSION: 1. No evidence of acute osseous abnormality or infection in the lumbar spine. 2. Similar appearance of lumbar disc and facet degeneration, perhaps with slightly decreased size of right-sided disc protrusion at L4-5. 3. Unchanged soft tissue anterior to the right L5-S1 facet joint, possibly a synovial cyst and resulting in right neural foraminal stenosis and potential right L5 nerve root impingement. 4.  Aortic Atherosclerosis (ICD10-I70.0). Electronically Signed   By: Sebastian Ache M.D.   On: 04/18/2018 16:13    EKG:   Orders placed or performed during the hospital encounter of 04/18/18  . ED EKG  . ED EKG  . ED EKG 12-Lead  . ED EKG 12-Lead  EKG last night showed more pronounced T wave inversions in lead V2 V2, V3 compared to admission EKG.  ASSESSMENT AND PLAN:   63 year old male patient with history of hypertension, CAD, PCI, history of AICD, pacemaker placement for history of ischemic cardiomyopathy, follows with [facility] cardiology comes in with fever which developed after general steroid injection. 1.  Sepsis present on admission with elevated lactic acid, elevated procalcitonin, likely discitis, his back pain improved today, WBC also decreased to 15.9 from 23.5.  Oday.  Continue vancomycin, cefepime, pain is less today.  Hypotensive yesterday but improved today.   #  2 l.eft-sided chest pain, sweating last night, EKG changes with more pronounced T wave inversions in leads V2, V3, obtain cardiology consult, text message Dr. Lady GaryFath, continue  aspirin, Imdur, cycle the troponins.  history of CAD, NSVT, ischemic cardiomyopathy with EF 20%, history of PCI, stent placement, follows up with [facility] cardiology, patient has history of AAA repair in 2016, has history of pacemaker, defibrillator placement, [facility],  #3/history of ischemic cardiomyopathy, patient started on isosorbide 60 mg twice daily recently 3 months ago by cardiology to help with coronary vasospasm.  Continue Imdur today.  Hypotension improved.  #4 GERD: Continue PPI 5.  Chronic congestive heart failure, right now he has sepsis so continue gentle hydration.  Discontinue IV fluids, resume Lasix, check echocardiogram. 6.  Chronic pain, narcotic seeking behavior as per the charts: Patient is already on Dilaudid 1 mg every 3 hours, Percocet every 6 hours, unable to increase or adjust pain medicine at this time.  Discussed with patient daughter Kateri [facility] declined the transfer.  Spoke with Dr. Thedore MinsSingh [facility] refused [facility] the patient, patient is aware of this. All the records are reviewed and case discussed with Care Management/Social Workerr. Management plans discussed with the patient, family and they are in agreement.  CODE STATUS: Full code  TOTAL TIME TAKING CARE OF THIS PATIENT: 38 minutes.   POSSIBLE D/C IN3-4 DAYS, DEPENDING ON CLINICAL CONDITION. More than 50% time spent in counseling, coordination of care  Katha HammingSnehalatha Kacper Cartlidge M.D on 04/20/2018 at 9:39 AM  Between 7am to 6pm - Pager - [phone]  After 6pm go to [email or web address] - password [email or web address]  Fabio Ne[facility]  Office  [phone]  CC: Primary care physician; McLean-Scocuzza, Pasty Spillersracy N, MD   Note: This dictation was prepared with Dragon dictation along with smaller phrase technology. Any transcriptional errors that result from this process are unintentional.

## 2018-04-20 NOTE — Consult Note (Signed)
Cardiology Consultation Note    Patient ID: Martin Hernandez Thurow, MRN: [id number], DOB/AGE: 64-Mar-1957 64 y.o. Admit date: 04/18/2018   Date of Consult: 04/20/2018 Primary Physician: McLean-Scocuzza, Pasty Spillersracy N, MD Primary Cardiologist: Jeralene PetersLisa Povsic, [facility]  Chief Complaint: fever Reason for Consultation: cad Requesting MD: Dr. Luberta MutterKonidena  HPI: Martin Hernandez Ferdinand is a 63 y.o. male with history of coronary artery disease, history of ischemic cardiomyopathy with ejection fraction of 20% who was admitted with a fever for an epidural injection.  He complains of some chest discomfort today.  Patient has extensive cardiac disease with a history of an ST elevation myocardial infarction in 2013 and underwent placement of a bare-metal stent to his ramus intermedius.  Later that year in 2013, he was noted to have an 8% mid left circumflex stenosis underwent PCI with a drug-eluting stent.  In 2014 he had an ICD placed for primary prevention.  Ejection fraction in 2016 was 20%.  He has a history of peripheral vascular disease status post graft a AAA.  He had a left heart cath in 2016 showing no significant progression of his coronary disease.  Repeat cardiac cath in 2017 showed a 99% ostial stenosis in the right coronary artery as and underwent placement of a drug-eluting stent in this distribution.  Cardiac cath in July 2017 revealed patent stents with no progression of disease treated medically.  He continues to smoke 1 to 2 packs of cigarettes a day.  Functional study in 2018 showed anterolateral and inferolateral infarct with apical ischemia.  These were unchanged from previously.  He does report frequent chest discomfort.  He has been taken isosorbide mononitrate twice daily.  Cardiac cath in 2018 revealed some progression of his mid RCA stenosis and underwent IVIS guided PCI of the mid RCA.  He continues to have fairly frequent chest discomfort.  He does continue to smoke at least a pack of cigarettes a day.  He had  epidural injections at the pain clinic on day of admission developed fever post injection.  He then complained of severe back pain and presented to the emergency room.  He was febrile to 102.6 Fahrenheit.  He had tachycardia with heart rates in the 90s to low 100s.  He was hypokalemic and had a leukocytosis.  He was started on broad-spectrum antibiotics.  EKG on mission showed no ischemia.  He complained of some chest pain this morning.  Troponin this morning was 0.03.  He is currently pain-free.  Past Medical History:  Diagnosis Date  . Allergy   . CAD (coronary artery disease)   . Depression   . Frequent headaches   . GERD (gastroesophageal reflux disease)   . Headache   . Hyperlipidemia   . Hypertension   . PAD (peripheral artery disease) (HCC)   . Tobacco abuse       Surgical History:  Past Surgical History:  Procedure Laterality Date  . COLONOSCOPY WITH PROPOFOL N/A 06/20/2017   Procedure: COLONOSCOPY WITH PROPOFOL;  Surgeon: Wyline MoodAnna, Kiran, MD;  [facility];  Service: Gastroenterology;  Laterality: N/A;  . COLONOSCOPY WITH PROPOFOL N/A 07/24/2017   Procedure: COLONOSCOPY WITH PROPOFOL;  Surgeon: Wyline MoodAnna, Kiran, MD;  [facility];  Service: Gastroenterology;  Laterality: N/A;  . ESOPHAGOGASTRODUODENOSCOPY (EGD) WITH PROPOFOL N/A 06/20/2017   Procedure: ESOPHAGOGASTRODUODENOSCOPY (EGD) WITH PROPOFOL;  Surgeon: Wyline MoodAnna, Kiran, MD;  [facility];  Service: Gastroenterology;  Laterality: N/A;  . PERCUTANEOUS CORONARY STENT INTERVENTION (PCI-S)    . STOMACH SURGERY  2009 [facility] per chart review pt had AAA repair   . TONSILLECTOMY       Home Meds: Prior to Admission medications   Medication Sig Start Date End Date Taking? Authorizing Provider  amLODipine (NORVASC) 2.5 MG tablet Take 2.5 mg by mouth daily.  01/16/18 01/16/19 Yes [provider]  aspirin 81 MG tablet Take 81 mg by mouth daily.   Yes [provider]  atorvastatin (LIPITOR) 80  MG tablet Take 1 tablet (80 mg total) by mouth daily at 6 PM. 02/20/18  Yes McLean-Scocuzza, Pasty Spillers, MD  baclofen (LIORESAL) 10 MG tablet Take 0.5 tablets (5 mg total) by mouth 2 (two) times daily. 08/29/17  Yes McLean-Scocuzza, Pasty Spillers, MD  Cholecalciferol 1.25 MG (50000 UT) capsule Take 1 capsule (50,000 Units total) by mouth once a week. 02/27/18  Yes McLean-Scocuzza, Pasty Spillers, MD  clopidogrel (PLAVIX) 75 MG tablet TAKE ONE (1) TABLET EACH DAY 07/30/17  Yes McLean-Scocuzza, Pasty Spillers, MD  isosorbide mononitrate (IMDUR) 30 MG 24 hr tablet Take 60 mg by mouth 2 (two) times daily.    Yes [provider]  lisinopril (PRINIVIL,ZESTRIL) 5 MG tablet 1 pill daily 02/20/18  Yes McLean-Scocuzza, Pasty Spillers, MD  metoprolol succinate (TOPROL-XL) 25 MG 24 hr tablet Take 1 tablet (25 mg total) by mouth daily. 02/20/18  Yes McLean-Scocuzza, Pasty Spillers, MD  mirtazapine (REMERON) 30 MG tablet 1 pill nightly 02/20/18  Yes McLean-Scocuzza, Pasty Spillers, MD  pantoprazole (PROTONIX) 40 MG tablet Take 1 tablet (40 mg total) by mouth daily. 30 minutes before food 01/15/18  Yes McLean-Scocuzza, Pasty Spillers, MD  pyridOXINE (VITAMIN B-6) 100 MG tablet Take 1 tablet (100 mg total) by mouth daily. 08/29/17  Yes McLean-Scocuzza, Pasty Spillers, MD  zolpidem (AMBIEN) 10 MG tablet Take 1 tablet (10 mg total) by mouth at bedtime as needed for sleep. 12/05/17  Yes McLean-Scocuzza, Pasty Spillers, MD  carbamide peroxide (DEBROX) 6.5 % OTIC solution Place 5 drops into the right ear 2 (two) times daily. X 1 week 01/15/18   McLean-Scocuzza, Pasty Spillers, MD  cetirizine (ZYRTEC) 10 MG tablet Take 1 tablet (10 mg total) by mouth daily. At night 02/27/18   McLean-Scocuzza, Pasty Spillers, MD  citalopram (CELEXA) 10 MG tablet Take 1 tablet (10 mg total) by mouth daily. Patient not taking: Reported on 04/18/2018 08/08/16   Tommie Sams, DO  fluticasone [facility]) 50 MCG/ACT nasal spray Place 1-2 sprays into both nostrils daily. 02/27/18   McLean-Scocuzza, Pasty Spillers, MD  ipratropium  (ATROVENT) 0.02 % nebulizer solution Inhale into the lungs. 12/07/14   [provider]  linaclotide Karlene Einstein) 145 MCG CAPS capsule Take 1 capsule (145 mcg total) by mouth daily before breakfast. 08/01/17 10/01/17  Wyline Mood, MD  nitroGLYCERIN (NITROSTAT) 0.4 MG SL tablet Place 1 tablet (0.4 mg total) under the tongue every 5 (five) minutes as needed for chest pain. 11/09/17   McLean-Scocuzza, Pasty Spillers, MD  oxyCODONE-acetaminophen (PERCOCET) 10-325 MG tablet  05/18/16   [provider]  Polyethylene Glycol 3350 (PEG 3350) POWD Please take this medication 1 to 3 times daily so that you have bowel movements everyday. 03/31/15   [provider]  sildenafil (REVATIO) 20 MG tablet 1-5 pills as needed before sex 30 minutes to 4 hours 02/27/18   McLean-Scocuzza, Pasty Spillers, MD    Inpatient Medications:  . aspirin EC  81 mg Oral Daily  . atorvastatin  80 mg Oral q1800  . baclofen  5 mg Oral BID  . clopidogrel  75 mg Oral Daily  . enoxaparin (LOVENOX) injection  40 mg Subcutaneous Q24H  . fluticasone  1-2 spray Each Nare Daily  . folic acid  1 mg Oral Daily  . isosorbide mononitrate  60 mg Oral Daily  . loratadine  10 mg Oral Daily  . metoprolol succinate  25 mg Oral Daily  . mirtazapine  30 mg Oral QHS  . pantoprazole  40 mg Oral Daily  . pyridOXINE  100 mg Oral Daily  . thiamine  100 mg Oral Daily  . cyanocobalamin  100 mcg Oral Daily   . ceFEPime (MAXIPIME) IV 2 g (04/20/18 1035)  . vancomycin 1,500 mg (04/20/18 4128)    Allergies: No Known Allergies  Social History   Socioeconomic History  . Marital status: Married    Spouse name: Not on file  . Number of children: Not on file  . Years of education: Not on file  . Highest education level: Not on file  Occupational History  . Not on file  Social Needs  . Financial resource strain: Not hard at all  . Food insecurity:    Worry: Never true    Inability: Never true  . Transportation needs:    Medical: No     Non-medical: No  Tobacco Use  . Smoking status: Current Every Day Smoker    Packs/day: 0.25    Types: Cigarettes  . Smokeless tobacco: Never Used  Substance and Sexual Activity  . Alcohol use: No    Alcohol/week: 0.0 standard drinks    Comment: quit 1 year ago  . Drug use: No  . Sexual activity: Not Currently  Lifestyle  . Physical activity:    Days per week: Not on file    Minutes per session: Not on file  . Stress: Not on file  Relationships  . Social connections:    Talks on phone: Not on file    Gets together: Not on file    Attends religious service: Not on file    Active member of club or organization: Not on file    Attends meetings of clubs or organizations: Not on file    Relationship status: Not on file  . Intimate partner violence:    Fear of current or ex partner: No    Emotionally abused: No    Physically abused: No    Forced sexual activity: No  Other Topics Concern  . Not on file  Social History Narrative   Disabled    4 kids 2 living    Used to do mill work    Smoker since age 20 max 2 pk per week now 1 ppd as of 02/2017    Married      Family History  Problem Relation Age of Onset  . Diabetes Mother   . Heart disease Mother   . Hypertension Mother   . Stroke Mother   . Prostate cancer Father   . Cancer Father        prostate cancer in 79s  . Diabetes Sister   . Heart disease Sister   . Ulcers Other        unknown who had ulcers in family      Review of Systems: A 12-system review of systems was performed and is negative except as noted in the HPI.  Labs: Recent Labs    04/18/18 1332 04/20/18 0849  TROPONINI <0.03 0.03*   Lab Results  Component Value Date   WBC 15.9 (H) 04/20/2018   HGB  12.5 (L) 04/20/2018   HCT 39.7 04/20/2018   MCV 92.8 04/20/2018   PLT 208 04/20/2018    Recent Labs  Lab 04/18/18 1332 04/19/18 0321 04/20/18 0337  NA 137 139  --   K 3.2* 3.6  --   CL 104 106  --   CO2 23 26  --   BUN 18 18  --    CREATININE 0.72 0.94 0.83  CALCIUM 9.0 8.3*  --   PROT 7.6  --   --   BILITOT 0.5  --   --   ALKPHOS 51  --   --   ALT 17  --   --   AST 27  --   --   GLUCOSE 86 101*  --    Lab Results  Component Value Date   CHOL 108 02/23/2017   HDL 34.20 (L) 02/23/2017   LDLCALC 57 02/23/2017   TRIG 80.0 02/23/2017   No results found for: DDIMER  Radiology/Studies:  Dg Chest 2 View  Result Date: 04/18/2018 CLINICAL DATA:  Cough.  Fever. EXAM: CHEST - 2 VIEW COMPARISON:  CT 08/16/2017. FINDINGS: AICD noted with tip over right ventricle. Cardiomegaly. No pulmonary venous congestion. No focal infiltrate. No pleural effusion or pneumothorax. Degenerative change thoracic spine. IMPRESSION: 1. AICD noted with tip over right atrium. Cardiomegaly. No pulmonary venous congestion. 2.  No acute pulmonary disease. Electronically Signed   By: Maisie Fus  Register   On: 04/18/2018 13:46   Ct Lumbar Spine Wo Contrast  Result Date: 04/18/2018 CLINICAL DATA:  Fever and back pain. Steroid or other back injection yesterday. EXAM: CT LUMBAR SPINE WITHOUT CONTRAST TECHNIQUE: Multidetector CT imaging of the lumbar spine was performed without intravenous contrast administration. Multiplanar CT image reconstructions were also generated. COMPARISON:  01/03/2018 FINDINGS: Segmentation: Standard. Alignment: Mild chronic lumbar spine straightening. No significant listhesis. Vertebrae: No acute fracture or suspicious osseous lesion. No interval osseous erosion to suggest discitis-osteomyelitis. Paraspinal and other soft tissues: Small locules of gas in the soft tissues posterior to the left L5 lamina, possibly related to the recent injection. No paraspinal fluid collection is evident on this unenhanced study. Left larger than right nonobstructing renal calculi. Extensive atherosclerosis of the abdominal aorta and its major branch vessels. Ectatic of infrarenal abdominal aorta. 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 Disc levels: T12-L1 and L1-2: Negative. L2-3: Disc bulging and facet hypertrophy result in at most mild bilateral neural foraminal stenosis without significant spinal stenosis, unchanged. L3-4: Disc bulging and moderate facet hypertrophy result in mild bilateral neural foraminal stenosis and borderline spinal stenosis, unchanged. L4-5: Chronic vacuum disc and disc space narrowing. Circumferential disc bulging, a broad right paracentral and subarticular disc protrusion, and mild facet hypertrophy result in asymmetric right lateral recess stenosis and mild-to-moderate bilateral neural foraminal stenosis without significant generalized spinal stenosis. The disc protrusion may be slightly smaller with slightly improved right lateral recess patency. A new small focus of ventral epidural gas in the left lateral recess likely arises from the disc without a sizable soft tissue component identified. L5-S1: Mild disc bulging and asymmetric moderate right facet arthrosis with similar appearance of soft tissue anterior to the right facet joint which contributes to moderate right neural foraminal stenosis with right L5 nerve root contact, possibly a synovial cyst. No spinal stenosis. IMPRESSION: 1. No evidence of acute osseous abnormality or infection in the lumbar spine.  2. Similar appearance of lumbar disc and facet degeneration, perhaps with slightly decreased size of right-sided disc protrusion at L4-5. 3. Unchanged soft tissue anterior to the right L5-S1 facet joint, possibly a synovial cyst and resulting in right neural foraminal stenosis and potential right L5 nerve root impingement. 4.  Aortic Atherosclerosis (ICD10-I70.0). Electronically Signed   By: Sebastian AcheAllen  Grady M.D.   On: 04/18/2018 16:13    Wt Readings from Last 3 Encounters:  04/18/18 64.9 kg  02/27/18 56.7 kg  01/23/18 58.7 kg    EKG: Sinus rhythm left  ventricular fascicular block no change from baseline electrocardiogram  Physical Exam:  Blood pressure 120/79, pulse 65, temperature 97.8 Hernandez (36.6 C), temperature source Oral, resp. rate (!) 24, height 5\' 7"  (1.702 m), weight 64.9 kg, SpO2 99 %. Body mass index is 22.4 kg/m. General: Well developed, well nourished, in no acute distress. Head: Normocephalic, atraumatic, sclera non-icteric, no xanthomas, nares are without discharge.  Neck: Negative for carotid bruits. JVD not elevated. Lungs: Clear bilaterally to auscultation without wheezes, rales, or rhonchi. Breathing is unlabored. Heart: RRR with S1 S2. No murmurs, rubs, or gallops appreciated. Abdomen: Soft, non-tender, non-distended with normoactive bowel sounds. No hepatomegaly. No rebound/guarding. No obvious abdominal masses. Msk:  Strength and tone appear normal for age. Extremities: No clubbing or cyanosis. No edema.  Distal pedal pulses are 2+ and equal bilaterally. Neuro: Alert and oriented X 3. No facial asymmetry. No focal deficit. Moves all extremities spontaneously. Psych:  Responds to questions appropriately with a normal affect.     Assessment and Plan  63 year old male with  coronary artery disease status post multiple PCI's.  Who was admitted with fever and back pain.  This is being treated with broad-spectrum antibiotics.  No obvious ischemia on electrocardiogram.  Had some atypical chest pain this morning which has resolved with resumption of his outpatient antianginal regimen.  Does not appear to be ischemic at present.  Would continue with current regimen of antianginals to include dual antiplatelet Therapy with aspirin and Plavix.  Also continue with metoprolol succinate 25 mg daily, isosorbide mononitrate 60 mg daily, continue with empiric antibiotics.  Will follow for evidence of ischemia.  Signed, Dalia HeadingKenneth A Niana Martorana MD 04/20/2018, 2:38 PM Pager: [phone]

## 2018-04-20 NOTE — Progress Notes (Signed)
CRITICAL VALUE ALERT  Critical Value:  Troponin 0.03  Date & Time Notied: 04/20/2018 @0944  Provider Notified:Dr Luberta Mutter  Orders Received/Actions taken: awaiting orders

## 2018-04-20 NOTE — Progress Notes (Signed)
Pt experienced some more chest pain. The chest pain was relieved with 1 Nitro tab. The pt states that he if does not receive his imdur x2 a day then he has chest pains all night. Spoke with Dr. Anne Hahn MD to place orders.

## 2018-04-20 NOTE — Progress Notes (Signed)
Pt resting in bed, NAD, c/o 10/10 pain. Pt smiling and talking on telephone, joking with caller. Provided PRN pain medication.

## 2018-04-20 NOTE — Progress Notes (Signed)
Dr Lady Gary paged for troponin of 0.04. VO given to notify cardiology only troponin over 1.0

## 2018-04-20 NOTE — Progress Notes (Signed)
Pt and wife adamant that they be transferred. Attempted to explain to both that they refused transfer yesterday and MD was aware of the request. Both yelling at RN, stating that RN was lying.  Again notified them of their options to stay and be treated or leave AMA. Dr Luberta Mutter on the floor and aware of pt's request. Updated MD over events from last night per nightshift report.

## 2018-04-20 NOTE — Progress Notes (Signed)
Pt provided with scheduled medication. Pt appears in NAD. Pt asking about pain medication. Pt stated he needed his imdur due to his "heart feeling funny". Notified MD of same. MD gave VO to check stat troponin levels for now.

## 2018-04-20 NOTE — Progress Notes (Signed)
Called [facility] transfer center again this morning at 8 AM, waiting for reply.  Patient wants to go to [facility], the main hospital.

## 2018-04-20 NOTE — Progress Notes (Signed)
Refused transfer yesterday, nurse Irving Burtonmily witnessed it yesterday..Marland Kitchen

## 2018-04-21 ENCOUNTER — Inpatient Hospital Stay: Admit: 2018-04-21 | Payer: Medicare HMO

## 2018-04-21 LAB — CBC
HCT: 37.8 % — ABNORMAL LOW (ref 39.0–52.0)
Hemoglobin: 11.9 g/dL — ABNORMAL LOW (ref 13.0–17.0)
MCH: 29.5 pg (ref 26.0–34.0)
MCHC: 31.5 g/dL (ref 30.0–36.0)
MCV: 93.8 fL (ref 80.0–100.0)
Platelets: 202 10*3/uL (ref 150–400)
RBC: 4.03 MIL/uL — ABNORMAL LOW (ref 4.22–5.81)
RDW: 15.8 % — ABNORMAL HIGH (ref 11.5–15.5)
WBC: 6.2 10*3/uL (ref 4.0–10.5)
nRBC: 0 % (ref 0.0–0.2)

## 2018-04-21 LAB — TROPONIN I: Troponin I: <0.03 ng/mL (ref <0.03)

## 2018-04-21 NOTE — Plan of Care (Signed)
  Problem: Education: Goal: Knowledge of General Education information will improve Description Including pain rating scale, medication(s)/side effects and non-pharmacologic comfort measures Outcome: Progressing   Problem: Education: Goal: Knowledge of General Education information will improve Description Including pain rating scale, medication(s)/side effects and non-pharmacologic comfort measures Outcome: Progressing   Problem: Health Behavior/Discharge Planning: Goal: Ability to manage health-related needs will improve Outcome: Progressing   Problem: Clinical Measurements: Goal: Ability to maintain clinical measurements within normal limits will improve Outcome: Progressing Goal: Will remain free from infection Outcome: Progressing Goal: Diagnostic test results will improve Outcome: Progressing   Problem: Activity: Goal: Risk for activity intolerance will decrease Outcome: Progressing

## 2018-04-21 NOTE — Progress Notes (Signed)
[facility] [place] [facility]   PATIENT NAME: Martin LatinoLindsay Hernandez    MR#:  [id number]  DATE OF BIRTH:  07/02/1955  No further fever, back pain improved, admitted cardiology because of chest pain, troponins are not elevated.  Feels much better today, had episode of chest pain last night got better with Imdur.  Wife is happy with the care that he is receiving here.  And [facility] but [facility] refused [facility].  CHIEF COMPLAINT:   Chief Complaint  Patient presents with  . Emesis  . Fever  Ill has low-grade fever up to 100 Fahrenheit, has significant low back pa same explained that patient in  REVIEW OF SYSTEMS:   ROS CONSTITUTIONAL:  no fever, back pain improved. eYES: No blurred or double vision.  EARS, NOSE, AND THROAT: No tinnitus or ear pain.  RESPIRATORY: Some cough but no shortness of breath or wheezing. CARDIOVASCULAR: Chest pain, sweating last night GASTROINTESTINAL: No nausea, vomiting, diarrhea or abdominal pain.  GENITOURINARY: No dysuria, hematuria.  ENDOCRINE: No polyuria, nocturia,  HEMATOLOGY: No anemia, easy bruising or bleeding SKIN: No rash or lesion. MUSCULOSKELETAL: Low back pain. NEUROLOGIC: No tingling, numbness, weakness.  PSYCHIATRY: No anxiety or depression.   DRUG ALLERGIES:  No Known Allergies  VITALS:  Blood pressure 114/84, pulse 66, temperature 98.4 F (36.9 C), temperature source Oral, resp. rate 18, height 5\' 7"  (1.702 m), weight 64.9 kg, SpO2 97 %.  PHYSICAL EXAMINATION:  GENERAL:  63 y.o.-year-old patient lying in the bed with no acute distress.  EYES: Pupils equal, round, reactive to light and accommodation. No scleral icterus. Extraocular muscles intact.  HEENT: Head atraumatic, normocephalic. Oropharynx and nasopharynx clear.  NECK:  Supple, no jugular venous distention. No thyroid enlargement, no tenderness.  LUNGS: Normal breath sounds bilaterally, no wheezing,  rales,rhonchi or crepitation. No use of accessory muscles of respiration.  CARDIOVASCULAR: S1, S2 normal. No murmurs, rubs, or gallops.  ABDOMEN: Soft, nontender, nondistended. Bowel sounds present. No organomegaly or mass.  EXTREMITIES: No pedal edema, cyanosis, or clubbing.  NEUROLOGIC: Cranial nerves II through XII are intact. Muscle strength 5/5 in all extremities. Sensation intact. Gait not checked.  Patient has tenderness to palpation right lumbar area. PSYCHIATRIC: The patient is alert and oriented x 3.  SKIN: No obvious rash, lesion, or ulcer.    LABORATORY PANEL:   CBC Recent Labs  Lab 04/21/18 0854  WBC 6.2  HGB 11.9*  HCT 37.8*  PLT 202   ------------------------------------------------------------------------------------------------------------------  Chemistries  Recent Labs  Lab 04/18/18 1332 04/19/18 0321 04/20/18 0337  NA 137 139  --   K 3.2* 3.6  --   CL 104 106  --   CO2 23 26  --   GLUCOSE 86 101*  --   BUN 18 18  --   CREATININE 0.72 0.94 0.83  CALCIUM 9.0 8.3*  --   AST 27  --   --   ALT 17  --   --   ALKPHOS 51  --   --   BILITOT 0.5  --   --    ------------------------------------------------------------------------------------------------------------------  Cardiac Enzymes Recent Labs  Lab 04/21/18 0229  TROPONINI <0.03   ------------------------------------------------------------------------------------------------------------------  RADIOLOGY:  No results found.  EKG:   Orders placed or performed during the hospital encounter of 04/18/18  . ED EKG  . ED EKG  . ED EKG 12-Lead  . ED EKG 12-Lead  EKG last night showed more pronounced T  wave inversions in lead V2 V2, V3 compared to admission EKG.  ASSESSMENT AND PLAN:   63 year old male patient with history of hypertension, CAD, PCI, history of AICD, pacemaker placement for history of ischemic cardiomyopathy, follows with [facility]ology comes in with fever which developed after  general steroid injection. 1.  Sepsis present on admission with elevated lactic acid, elevated procalcitonin, fever started after getting epidural steroid injection likely discitis, his back pain improved today, WBC also decreased to 15.9 from 23.5.  Oday.  Continue vancomycin, cefepime, WBC also improved, now leukocytosis improved and WBC normal today.  Appreciate ID following, patient can get CT with contrast tomorrow just to evaluate for possible discitis/abscess. #2  Chronic angina: Improved, troponins are negative, seen by Dr. Lady GaryFath, continue Imdur, echocardiogram done but results are pending.,  history of CAD, NSVT, ischemic cardiomyopathy with EF 20%, history of PCI, stent placement, follows up with [facility]ology, patient has history of AAA repair in 2016, has history of pacemaker, defibrillator placement, [facility],  #3/history of ischemic cardiomyopathy, patient started on isosorbide 60 mg twice daily recently 3 months ago by cardiology to help with coronary vasospasm.  Continue Imdur today.  Hypotension improved.  #4 GERD: Continue PPI 5.  Chronic congestive heart failure, right now he has sepsis so continue gentle hydration.  Discontinue IV fluids, resume Lasix, check echocardiogram. 6.  Chronic pain, narcotic seeking behavior as per the charts: Patient is already on Dilaudid 1 mg every 3 hours, Percocet every 6 hours, unable to increase or adjust pain medicine at this time.  Appreciate Dr. Rudene Andaavi Shanker seeing the patient, continue IV antibiotics, CT of LS spine with contrast tomorrow. All the records are reviewed and case discussed with Care Management/Social Workerr. Management plans discussed with the patient, family and they are in agreement.  CODE STATUS: Full code  TOTAL TIME TAKING CARE OF THIS PATIENT: 38 minutes.   POSSIBLE D/C IN3-4 DAYS, DEPENDING ON CLINICAL CONDITION. More than 50% time spent in counseling, coordination of  care  Katha HammingSnehalatha Suprena Travaglini M.D on 04/21/2018 at 11:08 AM  Between 7am to 6pm - Pager - [phone]  After 6pm go to [email or web address] - password [email or web address]  [facility]  Office  [phone]  CC: Primary care physician; McLean-Scocuzza, Pasty Spillersracy N, MD   Note: This dictation was prepared with Dragon dictation along with smaller phrase technology. Any transcriptional errors that result from this process are unintentional.

## 2018-04-21 NOTE — Progress Notes (Signed)
Date of Admission:  04/18/2018   *   ID: Geralyn FlashLindsay F Villafranca is a 63 y.o. male  Active Problems:   Sepsis (HCC)    Subjective: Doing better No fever Back pain better  Medications:  . aspirin EC  81 mg Oral Daily  . atorvastatin  80 mg Oral q1800  . baclofen  5 mg Oral BID  . clopidogrel  75 mg Oral Daily  . enoxaparin (LOVENOX) injection  40 mg Subcutaneous Q24H  . fluticasone  1-2 spray Each Nare Daily  . folic acid  1 mg Oral Daily  . isosorbide mononitrate  60 mg Oral BID  . loratadine  10 mg Oral Daily  . metoprolol succinate  25 mg Oral Daily  . mirtazapine  30 mg Oral QHS  . pantoprazole  40 mg Oral Daily  . pyridOXINE  100 mg Oral Daily  . thiamine  100 mg Oral Daily  . cyanocobalamin  100 mcg Oral Daily    Objective: Vital signs in last 24 hours: Temp:  [98.3 F (36.8 C)-98.4 F (36.9 C)] 98.4 F (36.9 C) (01/12 0756) Pulse Rate:  [66-68] 66 (01/12 0756) Resp:  [18] 18 (01/11 2244) BP: (103-131)/(76-85) 114/84 (01/12 0756) SpO2:  [95 %-97 %] 97 % (01/12 0756)  PHYSICAL EXAM:  General: Alert, cooperative, no distress, appears stated age.  Head: Normocephalic, without obvious abnormality, atraumatic. Eyes: Conjunctivae clear, anicteric sclerae. Pupils are equal ENT Nares normal. No drainage or sinus tenderness. Lips, mucosa, and tongue normal. No Thrush Neck: Supple, symmetrical, no adenopathy, thyroid: non tender no carotid bruit and no JVD. Back:tenderness over the left paraspinal area better Lungs: Clear to auscultation bilaterally. No Wheezing or Rhonchi. No rales. Heart: Regular rate and rhythm, no murmur, rub or gallop. Abdomen: Soft, non-tender,not distended. Bowel sounds normal. No masses Extremities: atraumatic, no cyanosis. No edema. No clubbing Skin: No rashes or lesions. Or bruising Lymph: Cervical, supraclavicular normal. Neurologic: Grossly non-focal  Lab Results Recent Labs    04/18/18 1332 04/19/18 0321 04/20/18 0337  WBC  16.4* 23.5* 15.9*  HGB 14.2 13.1 12.5*  HCT 44.1 40.9 39.7  NA 137 139  --   K 3.2* 3.6  --   CL 104 106  --   CO2 23 26  --   BUN 18 18  --   CREATININE 0.72 0.94 0.83   Liver Panel Recent Labs    04/18/18 1332  PROT 7.6  ALBUMIN 4.7  AST 27  ALT 17  ALKPHOS 51  BILITOT 0.5   Sedimentation Rate No results for input(s): ESRSEDRATE in the last 72 hours. C-Reactive Protein No results for input(s): CRP in the last 72 hours.  Microbiology:  Studies/Results: No results found.   Impression/Recommendation ?63 y.o. male with a history of coronary artery disease, is post DES to RCA and LCx hypertension, CHF, AICD in place AAA repair , DDD center to the ED on 04/18/2018 with with chills and temperature and low back pain.  Patient had an epidural steroid injection on [facility]/17/[facility] at [facility] [place].  That night he had severe pain on the left side at the site of the injection and then he started having chills and fever and he came to the ED.   Fever with sepsis : high procal and leucocytosis-much better within 12 hours of epidural injection- conern for infection secondary to the procedure.R/o bacterial infection with staph, strep or skin organisms-  eventhough CT was okay it was without contrast-will need contrast CT  or MRI ( has AICD-so will have to discuss with radiologist)to look for early discitis, abscess collection -can do it tomorrow 04/22/18  Continue vanco and cefepime and follow blood culture- duration of antibiotic and route of antibiotic depends on the CT result No evidence of other source for fever like flu, UTI, pneumonia Has AICD and no evidence of POCKET infection- 2 d echo done and pending  ? ?CAD with h/o multiple stents- seen by cardiologist for chest pain this admission.   ___________________________________________________ Discussed with patient,and his wife

## 2018-04-22 ENCOUNTER — Inpatient Hospital Stay: Payer: Medicare HMO

## 2018-04-22 ENCOUNTER — Inpatient Hospital Stay
Admit: 2018-04-22 | Discharge: 2018-04-22 | Disposition: A | Discharge status: Home / Self Care | Payer: Medicare HMO | Attending: Internal Medicine | Admitting: Internal Medicine

## 2018-04-22 ENCOUNTER — Other Ambulatory Visit (HOSPITAL_COMMUNITY): Payer: Medicare HMO

## 2018-04-22 ENCOUNTER — Encounter: Payer: Self-pay | Admitting: Radiology

## 2018-04-22 LAB — CBC WITH DIFFERENTIAL/PLATELET
ABS IMMATURE GRANULOCYTES: 0.01 10*3/uL (ref 0.00–0.07)
BASOS ABS: 0 10*3/uL (ref 0.0–0.1)
Basophils Relative: 0 %
Eosinophils Absolute: 0.1 10*3/uL (ref 0.0–0.5)
Eosinophils Relative: 2 %
HCT: 38.1 % — ABNORMAL LOW (ref 39.0–52.0)
Hemoglobin: 12.2 g/dL — ABNORMAL LOW (ref 13.0–17.0)
Immature Granulocytes: 0 %
Lymphocytes Relative: 37 %
Lymphs Abs: 2 10*3/uL (ref 0.7–4.0)
MCH: 29.3 pg (ref 26.0–34.0)
MCHC: 32 g/dL (ref 30.0–36.0)
MCV: 91.4 fL (ref 80.0–100.0)
Monocytes Absolute: 0.7 10*3/uL (ref 0.1–1.0)
Monocytes Relative: 13 %
NRBC: 0 % (ref 0.0–0.2)
Neutro Abs: 2.5 10*3/uL (ref 1.7–7.7)
Neutrophils Relative %: 48 %
Platelets: 240 10*3/uL (ref 150–400)
RBC: 4.17 MIL/uL — ABNORMAL LOW (ref 4.22–5.81)
RDW: 15.3 % (ref 11.5–15.5)
WBC: 5.3 10*3/uL (ref 4.0–10.5)

## 2018-04-22 LAB — COMPREHENSIVE METABOLIC PANEL
ALT: 24 U/L (ref 0–44)
AST: 47 U/L — ABNORMAL HIGH (ref 15–41)
Albumin: 3.6 g/dL (ref 3.5–5.0)
Alkaline Phosphatase: 47 U/L (ref 38–126)
Anion gap: 8 (ref 5–15)
BUN: 13 mg/dL (ref 8–23)
CO2: 26 mmol/L (ref 22–32)
Calcium: 8.6 mg/dL — ABNORMAL LOW (ref 8.9–10.3)
Chloride: 108 mmol/L (ref 98–111)
Creatinine, Ser: 0.68 mg/dL (ref 0.61–1.24)
GFR calc Af Amer: >60 mL/min (ref >60)
GFR calc non Af Amer: >60 mL/min (ref >60)
Glucose, Bld: 90 mg/dL (ref 70–99)
Potassium: 3.7 mmol/L (ref 3.5–5.1)
Sodium: 142 mmol/L (ref 135–145)
Total Bilirubin: 0.6 mg/dL (ref 0.3–1.2)
Total Protein: 6.7 g/dL (ref 6.5–8.1)

## 2018-04-22 LAB — ECHOCARDIOGRAM COMPLETE
Height: 67 in
WEIGHTICAEL: 2288 [oz_av]

## 2018-04-22 LAB — C-REACTIVE PROTEIN: CRP: 3.3 mg/dL — ABNORMAL HIGH (ref <1.0)

## 2018-04-22 LAB — VANCOMYCIN, PEAK: Vancomycin Pk: 23 ug/mL — ABNORMAL LOW (ref 30–40)

## 2018-04-22 MED ORDER — IOPAMIDOL (ISOVUE-300) INJECTION 61%: 100.0000 mL | Freq: Once | INTRAVENOUS | Status: DC | PRN

## 2018-04-22 MED ORDER — IOHEXOL 300 MG/ML  SOLN
100.0000 mL | Freq: Once | INTRAMUSCULAR | Status: AC | PRN
  Administered 2018-04-22: 100 mL via INTRAVENOUS

## 2018-04-22 MED ORDER — GABAPENTIN 300 MG PO CAPS: 300.0000 mg | ORAL_CAPSULE | Freq: Three times a day (TID) | ORAL | 0 refills | Status: DC | PRN

## 2018-04-22 NOTE — Care Management Important Message (Signed)
Important Message  Patient Details  Name: ROBERTS LARRALDE MRN: [id number] Date of Birth: 1955-04-17   Medicare Important Message Given:  Yes    Olegario Messier A Khalon Cansler 04/22/2018, 11:08 AM

## 2018-04-22 NOTE — Progress Notes (Signed)
*  PRELIMINARY RESULTS* Echocardiogram 2D Echocardiogram has been performed.  Martin Hernandez 04/22/2018, 8:28 AM

## 2018-04-22 NOTE — Plan of Care (Signed)

## 2018-04-22 NOTE — Care Management Note (Signed)
Case Management Note  Patient Details  Name: Martin Hernandez MRN: [id number] Date of Birth: 1955/07/29  Subjective/Objective:                  Met with Patient and wife Martin Hernandez to discuss Discharge plans and needs The patient is independent at home and walks with no assistance, still drives.  Patient states that he needs a BP cuff and a thermometer to check his temp. Patient uses [facility] on [address] Patient can afford medications Patient uses Dr. Algernon Huxley as PCP Patient has transportation in place and wife is the back up driver  Action/Plan: Check into getting a BP cuff and Thermometer for the patient    Expected Discharge Date:                  Expected Discharge Plan:  Home/Self Care  In-House Referral:     Discharge planning Services  CM Consult  Post Acute Care Choice:    Choice offered to:     DME Arranged:    DME Agency:     HH Arranged:    [place] Agency:     Status of Service:  Completed, signed off  If discussed at [employer] of Stay Meetings, dates discussed:    Additional Comments:  Su Hilt, RN 04/22/2018, 3:56 PM

## 2018-04-22 NOTE — Progress Notes (Signed)
Date of Admission:  04/18/2018     ID: Martin Hernandez is a 63 y.o. male  Active Problems:   Sepsis (HCC) leucocytosis Back pain   Subjective: Doing much better No fever Back pain much better  [facility] and he said on 1/8 pt received radiofrequency nerve lysis of lumbar dorsal nerves on the rt side L1-L4. Pt did not receive any epidural injections. But patient says he received the 4 injections on the left  side and the pain was centered around the needle entry site Repeat Ct lumbar spine with contrast done today   Medications:  . aspirin EC  81 mg Oral Daily  . atorvastatin  80 mg Oral q1800  . baclofen  5 mg Oral BID  . clopidogrel  75 mg Oral Daily  . enoxaparin (LOVENOX) injection  40 mg Subcutaneous Q24H  . fluticasone  1-2 spray Each Nare Daily  . folic acid  1 mg Oral Daily  . isosorbide mononitrate  60 mg Oral BID  . loratadine  10 mg Oral Daily  . metoprolol succinate  25 mg Oral Daily  . mirtazapine  30 mg Oral QHS  . pantoprazole  40 mg Oral Daily  . pyridOXINE  100 mg Oral Daily  . thiamine  100 mg Oral Daily  . cyanocobalamin  100 mcg Oral Daily    Objective: Vital signs in last 24 hours: Temp:  [98.2 F (36.8 C)-98.7 F (37.1 C)] 98.4 F (36.9 C) (01/13 1720) Pulse Rate:  [66-70] 66 (01/13 1720) Resp:  [18] 18 (01/13 1720) BP: (127-157)/(86-98) 148/98 (01/13 1720) SpO2:  [96 %-100 %] 100 % (01/13 1720)  PHYSICAL EXAM:  General: Alert, cooperative, no distress, appears stated age.  Head: Normocephalic, without obvious abnormality, atraumatic. Eyes: Conjunctivae clear, anicteric sclerae. Pupils are equal ENT Nares normal. No drainage or sinus tenderness. Lips, mucosa, and tongue normal. No Thrush Neck: Supple, symmetrical, no adenopathy, thyroid: non tender no carotid bruit and no JVD. Back: No lumbar tenderness    Lungs: Clear to auscultation bilaterally. No Wheezing or Rhonchi. No  rales. Heart: Regular rate and rhythm, no murmur, rub or gallop. Abdomen: Soft, non-tender,not distended. Bowel sounds normal. No masses Extremities: atraumatic, no cyanosis. No edema. No clubbing Skin: No rashes or lesions. Or bruising Lymph: Cervical, supraclavicular normal. Neurologic: Grossly non-focal  Lab Results Recent Labs    04/20/18 0337 04/21/18 0854 04/22/18 0320  WBC 15.9* 6.2 5.3  HGB 12.5* 11.9* 12.2*  HCT 39.7 37.8* 38.1*  NA  --   --  142  K  --   --  3.7  CL  --   --  108  CO2  --   --  26  BUN  --   --  13  CREATININE 0.83  --  0.68   Liver Panel Recent Labs    04/22/18 0320  PROT 6.7  ALBUMIN 3.6  AST 47*  ALT 24  ALKPHOS 47  BILITOT 0.6   Sedimentation Rate No results for input(s): ESRSEDRATE in the last 72 hours. C-Reactive Protein Recent Labs    04/22/18 0320  CRP 3.3*    Microbiology:  Studies/Results: Ct Lumbar Spine W Contrast  Result Date: 04/22/2018 CLINICAL DATA:  Continued back pain and fever following an injection last week. Resolved leukocytosis. EXAM: CT LUMBAR SPINE WITH CONTRAST TECHNIQUE: Multidetector CT imaging of the lumbar spine was performed with intravenous contrast administration. CONTRAST:  OMNIPAQUE IOHEXOL 300 MG/ML  SOLN  COMPARISON:  04/18/2018 FINDINGS: Segmentation: Standard. Alignment: Mild chronic straightening of the lumbar spine. No significant listhesis. Vertebrae: No acute fracture or suspicious osseous lesion. No interval endplate erosion or other aggressive osseous changes to suggest discitis-osteomyelitis. Small lucencies in the posterior left ilium, chronic based on a 2009 CT and likely benign. Paraspinal and other soft tissues: No paraspinal fluid collection or significant inflammatory changes. Left larger than right nonobstructing renal calculi. Partially visualize small low-density left renal lesions, likely cysts. 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 Colonic diverticulosis. Disc levels: Unchanged appearance of disc and facet degeneration resulting in up to moderate neural foraminal stenosis as described in detail on the recent prior CT. No high-grade spinal stenosis. IMPRESSION: 1. No evidence of acute infection or other acute osseous abnormality in the lumbar spine. 2. Unchanged appearance of disc and facet degeneration compared to the recent prior CT. 3. Nonobstructing bilateral nephrolithiasis. 4.  Aortic Atherosclerosis (ICD10-I70.0). Electronically Signed   By: Sebastian Ache M.D.   On: 04/22/2018 10:23     Assessment/Plan:  63 y.o.malewith a history ofcoronary artery disease, is post DES to RCA and LCx hypertension,CHF, AICD in place AAA repair , [facility] on 04/18/2018 with with chills and temperature and low back pain. Patient had an epidural steroid injection on [facility]at [facility] [place].  That night he had severe pain on the left side at the site of the injection and then he started having chills and fever and he came to the ED.   Fever with sepsis : resolved -high procal and leucocytosis-much better within 12 hours of radiofrequency treatment for dorsal lumbar nerves -it was not an epidural steroid injection-as per the physician Dr.Spivey the procedure was done on the rt side but patient insist that it was done on the left side and it was painful at the needle entry point and there are 4 needle marks on the left side. Fortunately the repeat CT scan with contrast done today do not show any collection either superficial or deep or evidence of discitis Blood culture neg so far. Leucocytosis has resolved- this could very well have  Been a superficial infection with either strep or staph possibly after the proceudre  Continue vanco and cefepime X 24 hrs ( 6 days) and will then switch to augmentin for another week- will not give  levaquin as high risk for side effects including prolonged QT   No evidence of other source for infection  like flu, UTI, pneumonia Has AICD and no evidence of POCKET infection- 2 d echo N ? ?CAD with h/o multiple stents- seen by cardiologist for chest pain this admission  Discussed the management with patient and his wife and Dr.Konidena

## 2018-04-22 NOTE — Progress Notes (Signed)
[facility] - [place] at [facility] [place]   PATIENT NAME: Martin Hernandez    MR#:  [id number]  DATE OF BIRTH:  [birth year]  Patient is feeling better, WBC normalized, no fever, no chest pain, has chronic back pain.  Patient CT of lumbosacral spine did not show any discitis.  CHIEF COMPLAINT:   Chief Complaint  Patient presents with  . Emesis  . Fever  Ill has low-grade fever up to 100 Fahrenheit, has significant low back pa same explained that patient in  REVIEW OF SYSTEMS:   ROS CONSTITUTIONAL:  no fever, back pain improved. eYES: No blurred or double vision.  EARS, NOSE, AND THROAT: No tinnitus or ear pain.  RESPIRATORY: Some cough but no shortness of breath or wheezing. CARDIOVASCULAR: Chest pain, sweating last night GASTROINTESTINAL: No nausea, vomiting, diarrhea or abdominal pain.  GENITOURINARY: No dysuria, hematuria.  ENDOCRINE: No polyuria, nocturia,  HEMATOLOGY: No anemia, easy bruising or bleeding SKIN: No rash or lesion. MUSCULOSKELETAL: Low back pain. NEUROLOGIC: No tingling, numbness, weakness.  PSYCHIATRY: No anxiety or depression.   DRUG ALLERGIES:  No Known Allergies  VITALS:  Blood pressure (!) 157/93, pulse 70, temperature 98.7 F (37.1 C), temperature source Oral, resp. rate 18, height 5\' 7"  (1.702 m), weight 64.9 kg, SpO2 99 %.  PHYSICAL EXAMINATION:  GENERAL:  63 y.o.-year-old patient lying in the bed with no acute distress.  EYES: Pupils equal, round, reactive to light and accommodation. No scleral icterus. Extraocular muscles intact.  HEENT: Head atraumatic, normocephalic. Oropharynx and nasopharynx clear.  NECK:  Supple, no jugular venous distention. No thyroid enlargement, no tenderness.  LUNGS: Normal breath sounds bilaterally, no wheezing, rales,rhonchi or crepitation. No use of accessory muscles of respiration.  CARDIOVASCULAR: S1, S2 normal. No murmurs, rubs, or gallops.  ABDOMEN: Soft, nontender, nondistended. Bowel  sounds present. No organomegaly or mass.  EXTREMITIES: No pedal edema, cyanosis, or clubbing.  NEUROLOGIC: Cranial nerves II through XII are intact. Muscle strength 5/5 in all extremities. Sensation intact. Gait not checked.  Patient has tenderness to palpation right lumbar area. PSYCHIATRIC: The patient is alert and oriented x 3.  SKIN: No obvious rash, lesion, or ulcer.    LABORATORY PANEL:   CBC Recent Labs  Lab 04/22/18 0320  WBC 5.3  HGB 12.2*  HCT 38.1*  PLT 240   ------------------------------------------------------------------------------------------------------------------  Chemistries  Recent Labs  Lab 04/22/18 0320  NA 142  K 3.7  CL 108  CO2 26  GLUCOSE 90  BUN 13  CREATININE 0.68  CALCIUM 8.6*  AST 47*  ALT 24  ALKPHOS 47  BILITOT 0.6   ------------------------------------------------------------------------------------------------------------------  Cardiac Enzymes Recent Labs  Lab 04/21/18 0229  TROPONINI <0.03   ------------------------------------------------------------------------------------------------------------------  RADIOLOGY:  Ct Lumbar Spine W Contrast  Result Date: 04/22/2018 CLINICAL DATA:  Continued back pain and fever following an injection last week. Resolved leukocytosis. EXAM: CT LUMBAR SPINE WITH CONTRAST TECHNIQUE: Multidetector CT imaging of the lumbar spine was performed with intravenous contrast administration. CONTRAST:  OMNIPAQUE IOHEXOL 300 MG/ML  SOLN COMPARISON:  04/18/2018 FINDINGS: Segmentation: Standard. Alignment: Mild chronic straightening of the lumbar spine. No significant listhesis. Vertebrae: No acute fracture or suspicious osseous lesion. No interval endplate erosion or other aggressive osseous changes to suggest discitis-osteomyelitis. Small lucencies in the posterior left ilium, chronic based on a 2009 CT and likely benign. Paraspinal and other soft tissues: No paraspinal fluid collection or significant  inflammatory changes. Left larger than right nonobstructing renal calculi. Partially visualize small low-density left renal  lesions, likely cysts. 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 Colonic diverticulosis. Disc levels: Unchanged appearance of disc and facet degeneration resulting in up to moderate neural foraminal stenosis as described in detail on the recent prior CT. No high-grade spinal stenosis. IMPRESSION: 1. No evidence of acute infection or other acute osseous abnormality in the lumbar spine. 2. Unchanged appearance of disc and facet degeneration compared to the recent prior CT. 3. Nonobstructing bilateral nephrolithiasis. 4.  Aortic Atherosclerosis (ICD10-I70.0). Electronically Signed   By: Sebastian AcheAllen  Grady M.D.   On: 04/22/2018 10:23    EKG:   Orders placed or performed during the hospital encounter of 04/18/18  . ED EKG  . ED EKG  . ED EKG 12-Lead  . ED EKG 12-Lead  EKG last night showed more pronounced T wave inversions in lead V2 V2, V3 compared to admission EKG.  ASSESSMENT AND PLAN:   63 year old male patient with history of hypertension, CAD, PCI, history of AICD, pacemaker placement for history of ischemic cardiomyopathy, follows with [facility] cardiology comes in with fever which developed after general steroid injection. 1.  Sepsis present on admission with elevated lactic acid, elevated procalcitonin, fever started after getting epidural steroid injection likely transient bacteremia, improved  CT of lumbosacral spine again today, blood cultures have been negative, patient has no further leukocytosis or fever and he is doing well, tolerating the diet, continue IV antibiotics for another 24 hours, likely discharge home tomorrow with Augmentin.  Discussed with ID.   #2  Chronic angina: Improved, troponins are negative, seen by Dr. Lady GaryFath, continue Imdur,  echocardiogram done but results are pending.,  history of CAD, NSVT, ischemic cardiomyopathy with EF 20%, history of PCI, stent placement, follows up with [facility] cardiology, patient has history of AAA repair in 2016, has history of pacemaker, defibrillator placement, #3/history of ischemic cardiomyopathy, patient started on isosorbide 60 mg twice daily recently 3 months ago by cardiology to help with coronary vasospasm.  Continue Imdur today.  Hypotension improved.  #4 GERD: Continue PPI 5.  Chronic congestive heart failure, right now he has sepsis so continue gentle hydration.  Discontinue IV fluids, resume Lasix, check echocardiogram. 6.  Chronic pain, narcotic seeking behavior as per the charts: Patient is already on Dilaudid 1 mg every 3 hours, Percocet every 6 hours, unable to increase or adjust pain medicine at this time.  Appreciate Dr. Rudene Andaavi Shanker seeing the patient, continue IV antibiotics All the records are reviewed and case discussed with Care Management/Social Workerr. Management plans discussed with the patient, family and they are in agreement.  CODE STATUS: Full code  TOTAL TIME TAKING CARE OF THIS PATIENT: 38 minutes.   POSSIBLE D/C IN3-4 DAYS, DEPENDING ON CLINICAL CONDITION. More than 50% time spent in counseling, coordination of care  Katha HammingSnehalatha Netty Sullivant M.D on 04/22/2018 at 5:18 PM  Between 7am to 6pm - Pager - [phone]  After 6pm go to [email or web address] - password [email or web address]  [facility]  Office  [phone]  CC: Primary care physician; McLean-Scocuzza, Pasty Spillersracy N, MD   Note: This dictation was prepared with Dragon dictation along with smaller phrase technology. Any transcriptional errors that result from this process are unintentional.

## 2018-04-23 ENCOUNTER — Telehealth: Payer: Self-pay | Admitting: Infectious Diseases

## 2018-04-23 LAB — CULTURE, BLOOD (ROUTINE X 2)
Culture: NO GROWTH
Culture: NO GROWTH
Special Requests: ADEQUATE

## 2018-04-23 LAB — VANCOMYCIN, TROUGH: Vancomycin Tr: 5 ug/mL — ABNORMAL LOW (ref 15–20)

## 2018-04-23 LAB — PROCALCITONIN: Procalcitonin: 1.28 ng/mL

## 2018-04-23 MED ORDER — AMOXICILLIN-POT CLAVULANATE 875-125 MG PO TABS: 1.0000 | ORAL_TABLET | Freq: Two times a day (BID) | ORAL | 0 refills | Status: AC

## 2018-04-23 NOTE — Telephone Encounter (Signed)
Ok per Dr. Rivka Safer to discharge patient since last dose of Cefepime was given at 9:45 am this morning.

## 2018-04-23 NOTE — Care Management (Signed)
Notified Barbara Cower with [facility] that this patient will most likely go Home on IV ABX.

## 2018-04-23 NOTE — Discharge Summary (Signed)
Martin Hernandez, is a 63 y.o. male  DOB 04-29-1955  MRN [id number].  Admission date:  04/18/2018  Admitting Physician  Campbell Stall, MD  Discharge Date:  04/23/2018   Primary MD  McLean-Scocuzza, Pasty Spillers, MD  Recommendations for primary care physician for things to follow:   Follow-up with PCP in 1 week Follow-up with primary cardiologist from [facility] as scheduled.   Admission Diagnosis  Acute bilateral low back pain, unspecified whether sciatica present [M54.5] Sepsis, due to unspecified organism, unspecified whether acute organ dysfunction present [facility]) [A41.9]   Discharge Diagnosis  Acute bilateral low back pain, unspecified whether sciatica present [M54.5] Sepsis, due to unspecified organism, unspecified whether acute organ dysfunction present [facility]) [A41.9]    Active Problems:   Sepsis [facility])      Past Medical History:  Diagnosis Date  . Allergy   . CAD (coronary artery disease)   . Depression   . Frequent headaches   . GERD (gastroesophageal reflux disease)   . Headache   . Hyperlipidemia   . Hypertension   . PAD (peripheral artery disease) (HCC)   . Tobacco abuse     Past Surgical History:  Procedure Laterality Date  . COLONOSCOPY WITH PROPOFOL N/A 06/20/2017   Procedure: COLONOSCOPY WITH PROPOFOL;  Surgeon: Wyline Mood, MD;  Location: [facility] ENDOSCOPY;  Service: Gastroenterology;  Laterality: N/A;  . COLONOSCOPY WITH PROPOFOL N/A 07/24/2017   Procedure: COLONOSCOPY WITH PROPOFOL;  Surgeon: Wyline Mood, MD;  Location: [facility] ENDOSCOPY;  Service: Gastroenterology;  Laterality: N/A;  . ESOPHAGOGASTRODUODENOSCOPY (EGD) WITH PROPOFOL N/A 06/20/2017   Procedure: ESOPHAGOGASTRODUODENOSCOPY (EGD) WITH PROPOFOL;  Surgeon: Wyline Mood, MD;  Location: [facility] ENDOSCOPY;  Service: Gastroenterology;  Laterality: N/A;  .  PERCUTANEOUS CORONARY STENT INTERVENTION (PCI-S)    . STOMACH SURGERY     2009 [facility] per chart review pt had AAA repair   . TONSILLECTOMY         History of present illness and  Hospital Course:     Kindly see H&P for history of present illness and admission details, please review complete Labs, Consult reports and Test reports for all details in brief  HPI  from the history and physical done on the day of admission 63 year old male patient admitted for severe back pain, fever started after epidural steroid injection, admitted for sepsis.   Hospital Course   Sepsis present on admission with elevated lactic acid, elevated procalcitonin, fever started after getting epidural steroid injection, patient gets injections to the back every 6 months, patient had severe pain, fever followed by injection, patient CT of lumbosacral spine admission did not show discitis, patient received IV vancomycin, cefepime in the hospital.  Seen by Dr. Joylene Draft from ID, leukocytosis up to 23.5, elevated procalcitonin up to 15.4, patient procalcitonin decreased, leukocytosis also improved with IV antibiotics, WBC normalized, patient WBC decreased to 5.3 yesterday, seen by Dr. Rudene Anda, patient had CT lumbosacral spine, did not show any discitis, thought to have transient bacteremia, patient blood cultures have been negative, afebrile, so ID recommended Vanco and cefepime for total of 6 days, discharging with Augmentin for 7 days.  Patient is agreeable for this plan. 2.  History of chronic systolic heart failure, EF 20%.  Echo done on this admission on January 13 showed EF 50 to 55%. Has   Ischemic cardiomyopathy, patient has AICD, echocardiogram did not show any AICD pocket infection, seen by cardiology from Dr. Lady Gary, 3.  History of CAD, continue to have chest pain in the  hospital, troponins have been negative, EKG did not show acute changes, patient continued on aspirin, beta-blockers, nitrates, seen by cardiology  Dr. Lady GaryFath did not think any acute cardiac intervention is needed.  Patient can continue aspirin, Plavix, beta-blockers, nitrates that he was taking before. 4.  GERD: Continue PPIs  6.  History of chronic back pain, narcotic asking behaviors as per patient's charts in epic, patient can continue his chronic pain medicines that he was taking before  #7,history of nonsustained V. tach, patient had AICD placed and [facility].  He is to keep the appointment and follow-up with him as scheduled. Discharge Condition: Stable   Follow UP  Follow-up Information    McLean-Scocuzza, Pasty Spillersracy N, MD. Schedule an appointment as soon as possible for a visit in 1 week(s).   Specialty:  Internal Medicine Contact information: [address] [place] [id number] [phone]             Discharge Instructions  and  Discharge Medications      Allergies as of 04/23/2018   No Known Allergies     Medication List    TAKE these medications   amLODipine 2.5 MG tablet Commonly known as:  NORVASC Take 2.5 mg by mouth daily.   amoxicillin-clavulanate 875-125 MG tablet Commonly known as:  AUGMENTIN Take 1 tablet by mouth 2 (two) times daily for 7 days.   aspirin 81 MG tablet Take 81 mg by mouth daily.   atorvastatin 80 MG tablet Commonly known as:  LIPITOR Take 1 tablet (80 mg total) by mouth daily at 6 PM.   baclofen 10 MG tablet Commonly known as:  LIORESAL Take 0.5 tablets (5 mg total) by mouth 2 (two) times daily.   carbamide peroxide 6.5 % OTIC solution Commonly known as:  DEBROX Place 5 drops into the right ear 2 (two) times daily. X 1 week   cetirizine 10 MG tablet Commonly known as:  ZYRTEC Take 1 tablet (10 mg total) by mouth daily. At night   Cholecalciferol 1.25 MG (50000 UT) capsule Take 1 capsule (50,000 Units total) by mouth once a week.   citalopram 10 MG tablet Commonly known as:  CELEXA Take 1 tablet (10 mg total) by mouth daily.   clopidogrel 75  MG tablet Commonly known as:  PLAVIX TAKE ONE (1) TABLET EACH DAY   fluticasone 50 MCG/ACT nasal spray Commonly known as:  FLONASE Place 1-2 sprays into both nostrils daily.   gabapentin 300 MG capsule Commonly known as:  NEURONTIN Take 1 capsule (300 mg total) by mouth 3 (three) times daily as needed (neuropathic pain). What changed:    how much to take  how to take this  when to take this  reasons to take this   ipratropium 0.02 % nebulizer solution Commonly known as:  ATROVENT Inhale into the lungs.   isosorbide mononitrate 30 MG 24 hr tablet Commonly known as:  IMDUR Take 60 mg by mouth 2 (two) times daily.   linaclotide 145 MCG Caps capsule Commonly known as:  LINZESS Take 1 capsule (145 mcg total) by mouth daily before breakfast.   lisinopril 5 MG tablet Commonly known as:  PRINIVIL,ZESTRIL 1 pill daily   metoprolol succinate 25 MG 24 hr tablet Commonly known as:  TOPROL-XL Take 1 tablet (25 mg total) by mouth daily.   mirtazapine 30 MG tablet Commonly known as:  REMERON 1 pill nightly   nitroGLYCERIN 0.4 MG SL tablet Commonly known as:  NITROSTAT Place 1 tablet (  0.4 mg total) under the tongue every 5 (five) minutes as needed for chest pain.   oxyCODONE-acetaminophen 10-325 MG tablet Commonly known as:  PERCOCET   pantoprazole 40 MG tablet Commonly known as:  PROTONIX Take 1 tablet (40 mg total) by mouth daily. 30 minutes before food   PEG 3350 Powd Please take this medication 1 to 3 times daily so that you have bowel movements everyday.   pyridOXINE 100 MG tablet Commonly known as:  VITAMIN B-6 Take 1 tablet (100 mg total) by mouth daily.   sildenafil 20 MG tablet Commonly known as:  REVATIO 1-5 pills as needed before sex 30 minutes to 4 hours   zolpidem 10 MG tablet Commonly known as:  AMBIEN Take 1 tablet (10 mg total) by mouth at bedtime as needed for sleep.         Diet and Activity recommendation: See Discharge Instructions  above   Consults obtained -cardiology, ID   Major procedures and Radiology Reports - PLEASE review detailed and final reports for all details, in brief -      Dg Chest 2 View  Result Date: 04/18/2018 CLINICAL DATA:  Cough.  Fever. EXAM: CHEST - 2 VIEW COMPARISON:  CT 08/16/2017. FINDINGS: AICD noted with tip over right ventricle. Cardiomegaly. No pulmonary venous congestion. No focal infiltrate. No pleural effusion or pneumothorax. Degenerative change thoracic spine. IMPRESSION: 1. AICD noted with tip over right atrium. Cardiomegaly. No pulmonary venous congestion. 2.  No acute pulmonary disease. Electronically Signed   By: Maisie Fushomas  Register   On: 04/18/2018 13:46   Ct Lumbar Spine Wo Contrast  Result Date: 04/18/2018 CLINICAL DATA:  Fever and back pain. Steroid or other back injection yesterday. EXAM: CT LUMBAR SPINE WITHOUT CONTRAST TECHNIQUE: Multidetector CT imaging of the lumbar spine was performed without intravenous contrast administration. Multiplanar CT image reconstructions were also generated. COMPARISON:  01/03/2018 FINDINGS: Segmentation: Standard. Alignment: Mild chronic lumbar spine straightening. No significant listhesis. Vertebrae: No acute fracture or suspicious osseous lesion. No interval osseous erosion to suggest discitis-osteomyelitis. Paraspinal and other soft tissues: Small locules of gas in the soft tissues posterior to the left L5 lamina, possibly related to the recent injection. No paraspinal fluid collection is evident on this unenhanced study. Left larger than right nonobstructing renal calculi. Extensive atherosclerosis of the abdominal aorta and its major branch vessels. Ectatic of infrarenal abdominal aorta. 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 Disc levels: T12-L1 and L1-2: Negative. L2-3: Disc bulging and facet hypertrophy result in at most mild  bilateral neural foraminal stenosis without significant spinal stenosis, unchanged. L3-4: Disc bulging and moderate facet hypertrophy result in mild bilateral neural foraminal stenosis and borderline spinal stenosis, unchanged. L4-5: Chronic vacuum disc and disc space narrowing. Circumferential disc bulging, a broad right paracentral and subarticular disc protrusion, and mild facet hypertrophy result in asymmetric right lateral recess stenosis and mild-to-moderate bilateral neural foraminal stenosis without significant generalized spinal stenosis. The disc protrusion may be slightly smaller with slightly improved right lateral recess patency. A new small focus of ventral epidural gas in the left lateral recess likely arises from the disc without a sizable soft tissue component identified. L5-S1: Mild disc bulging and asymmetric moderate right facet arthrosis with similar appearance of soft tissue anterior to the right facet joint which contributes to moderate right neural foraminal stenosis with right L5 nerve root contact, possibly a synovial cyst.  No spinal stenosis. IMPRESSION: 1. No evidence of acute osseous abnormality or infection in the lumbar spine. 2. Similar appearance of lumbar disc and facet degeneration, perhaps with slightly decreased size of right-sided disc protrusion at L4-5. 3. Unchanged soft tissue anterior to the right L5-S1 facet joint, possibly a synovial cyst and resulting in right neural foraminal stenosis and potential right L5 nerve root impingement. 4.  Aortic Atherosclerosis (ICD10-I70.0). Electronically Signed   By: Sebastian Ache M.D.   On: 04/18/2018 16:13   Ct Lumbar Spine W Contrast  Result Date: 04/22/2018 CLINICAL DATA:  Continued back pain and fever following an injection last week. Resolved leukocytosis. EXAM: CT LUMBAR SPINE WITH CONTRAST TECHNIQUE: Multidetector CT imaging of the lumbar spine was performed with intravenous contrast administration. CONTRAST:  OMNIPAQUE  IOHEXOL 300 MG/ML  SOLN COMPARISON:  04/18/2018 FINDINGS: Segmentation: Standard. Alignment: Mild chronic straightening of the lumbar spine. No significant listhesis. Vertebrae: No acute fracture or suspicious osseous lesion. No interval endplate erosion or other aggressive osseous changes to suggest discitis-osteomyelitis. Small lucencies in the posterior left ilium, chronic based on a 2009 CT and likely benign. Paraspinal and other soft tissues: No paraspinal fluid collection or significant inflammatory changes. Left larger than right nonobstructing renal calculi. Partially visualize small low-density left renal lesions, likely cysts. 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 Colonic diverticulosis. Disc levels: Unchanged appearance of disc and facet degeneration resulting in up to moderate neural foraminal stenosis as described in detail on the recent prior CT. No high-grade spinal stenosis. IMPRESSION: 1. No evidence of acute infection or other acute osseous abnormality in the lumbar spine. 2. Unchanged appearance of disc and facet degeneration compared to the recent prior CT. 3. Nonobstructing bilateral nephrolithiasis. 4.  Aortic Atherosclerosis (ICD10-I70.0). Electronically Signed   By: Sebastian Ache M.D.   On: 04/22/2018 10:23    Micro Results     Recent Results (from the past 240 hour(s))  Blood Culture (routine x 2)     Status: None   Collection Time: 04/18/18  4:35 PM  Result Value Ref Range Status   Specimen Description BLOOD BLOOD RIGHT FOREARM  Final   Special Requests   Final    BOTTLES DRAWN AEROBIC AND ANAEROBIC Blood Culture results may not be optimal due to an inadequate volume of blood received in culture bottles   Culture   Final    NO GROWTH 5 DAYS Performed at [facility], [address], [place], [place] [address]    Report Status  04/23/2018 FINAL  Final  Blood Culture (routine x 2)     Status: None   Collection Time: 04/18/18  4:45 PM  Result Value Ref Range Status   Specimen Description BLOOD LEFT ANTECUBITAL  Final   Special Requests   Final    BOTTLES DRAWN AEROBIC AND ANAEROBIC Blood Culture adequate volume   Culture   Final    NO GROWTH 5 DAYS Performed at [facility], [address], [place], [place] [address]    Report Status 04/23/2018 FINAL  Final  Urine Culture     Status: None   Collection Time: 04/18/18  5:58 PM  Result Value Ref Range Status   Specimen Description URINE, RANDOM  Final   Special Requests   Final    NONE Performed at [facility], [address], [place], [place] [address]    Culture  NO GROWTH  Final   Report Status 04/20/2018 FINAL  Final       Today   Subjective:   Martin Hernandez today has no headache,no chest abdominal pain,no new weakness tingling or numbness, feels much better wants to go home today.   Objective:   Blood pressure 123/85, pulse 62, temperature 97.9 F (36.6 C), temperature source Oral, resp. rate 17, height 5\' 7"  (1.702 m), weight 64.9 kg, SpO2 97 %.   Intake/Output Summary (Last 24 hours) at 04/23/2018 0943 Last data filed at 04/23/2018 0800 Gross per 24 hour  Intake 1478.69 ml  Output 675 ml  Net 803.69 ml    Exam Awake Alert, Oriented x 3, No new F.N deficits, Normal affect [place].AT,PERRAL Supple Neck,No JVD, No cervical lymphadenopathy appriciated.  Symmetrical Chest wall movement, Good air movement bilaterally, CTAB RRR,No Gallops,Rubs or new Murmurs, No Parasternal Heave +ve B.Sounds, Abd Soft, Non tender, No organomegaly appriciated, No rebound -guarding or rigidity. No Cyanosis, Clubbing or edema, No new Rash or bruise  Data Review   CBC w Diff:  Lab Results  Component Value Date   WBC 5.3 04/22/2018   HGB 12.2 (L) 04/22/2018   HCT 38.1 (L) 04/22/2018   PLT 240 04/22/2018   LYMPHOPCT 37 04/22/2018    MONOPCT 13 04/22/2018   EOSPCT 2 04/22/2018   BASOPCT 0 04/22/2018    CMP:  Lab Results  Component Value Date   NA 142 04/22/2018   K 3.7 04/22/2018   CL 108 04/22/2018   CO2 26 04/22/2018   BUN 13 04/22/2018   CREATININE 0.68 04/22/2018   PROT 6.7 04/22/2018   ALBUMIN 3.6 04/22/2018   BILITOT 0.6 04/22/2018   ALKPHOS 47 04/22/2018   AST 47 (H) 04/22/2018   ALT 24 04/22/2018  .   Total Time in preparing paper work, data evaluation and todays exam - 35 minutes  Katha Hamming M.D on 04/23/2018 at 9:43 AM    Note: This dictation was prepared with Dragon dictation along with smaller phrase technology. Any transcriptional errors that result from this process are unintentional.

## 2018-04-23 NOTE — Telephone Encounter (Signed)
Nurse called from [facility] regarding patient [id number]. Wants the OK to discharge patient after last dose of Cefephne.

## 2018-04-23 NOTE — Care Management (Signed)
The foundation has agreed to purchase the BP cuff for the patient and ship it to him, Notified the patient

## 2018-04-23 NOTE — Progress Notes (Signed)
Pt ready for discharge home today per MD. Patient assessment unchanged from this morning. Cleared by Dr. Rivka Safer to discharge after 10am cefepime dose. Reviewed discharge instructions and prescriptions with pt and his wife; all questions answered and pt verbalized understanding. PIV removed, VSS. Pt assisted to car via [employer].   Shelbie Ammons

## 2018-04-24 ENCOUNTER — Other Ambulatory Visit: Payer: Self-pay | Admitting: Internal Medicine

## 2018-04-24 DIAGNOSIS — I2510 Atherosclerotic heart disease of native coronary artery without angina pectoris: Secondary | ICD-10-CM

## 2018-04-24 MED ORDER — CLOPIDOGREL BISULFATE 75 MG PO TABS: ORAL_TABLET | ORAL | 3 refills | Status: DC

## 2018-04-30 ENCOUNTER — Encounter: Payer: Self-pay | Admitting: Internal Medicine

## 2018-04-30 ENCOUNTER — Ambulatory Visit (INDEPENDENT_AMBULATORY_CARE_PROVIDER_SITE_OTHER): Payer: Medicare HMO | Admitting: Internal Medicine

## 2018-04-30 VITALS — BP 120/88 | HR 80 | Temp 97.6°F | Ht 67.0 in | Wt 136.0 lb

## 2018-04-30 DIAGNOSIS — R5383 Other fatigue: Secondary | ICD-10-CM

## 2018-04-30 DIAGNOSIS — R112 Nausea with vomiting, unspecified: Secondary | ICD-10-CM | POA: Diagnosis not present

## 2018-04-30 DIAGNOSIS — E876 Hypokalemia: Secondary | ICD-10-CM | POA: Diagnosis not present

## 2018-04-30 DIAGNOSIS — R531 Weakness: Secondary | ICD-10-CM | POA: Diagnosis not present

## 2018-04-30 DIAGNOSIS — A419 Sepsis, unspecified organism: Secondary | ICD-10-CM | POA: Diagnosis not present

## 2018-04-30 LAB — CBC WITH DIFFERENTIAL/PLATELET
Basophils Absolute: 0 10*3/uL (ref 0.0–0.1)
Basophils Relative: 0.5 % (ref 0.0–3.0)
Eosinophils Absolute: 0.1 10*3/uL (ref 0.0–0.7)
Eosinophils Relative: 1.1 % (ref 0.0–5.0)
HCT: 42.8 % (ref 39.0–52.0)
Hemoglobin: 14 g/dL (ref 13.0–17.0)
Lymphocytes Relative: 34.4 % (ref 12.0–46.0)
Lymphs Abs: 2.3 10*3/uL (ref 0.7–4.0)
MCHC: 32.8 g/dL (ref 30.0–36.0)
MCV: 91.5 fl (ref 78.0–100.0)
MONOS PCT: 10.4 % (ref 3.0–12.0)
Monocytes Absolute: 0.7 10*3/uL (ref 0.1–1.0)
Neutro Abs: 3.6 10*3/uL (ref 1.4–7.7)
Neutrophils Relative %: 53.6 % (ref 43.0–77.0)
Platelets: 395 10*3/uL (ref 150.0–400.0)
RBC: 4.68 Mil/uL (ref 4.22–5.81)
RDW: 15.8 % — AB (ref 11.5–15.5)
WBC: 6.7 10*3/uL (ref 4.0–10.5)

## 2018-04-30 LAB — COMPREHENSIVE METABOLIC PANEL
ALT: 17 U/L (ref 0–53)
AST: 25 U/L (ref 0–37)
Albumin: 4.4 g/dL (ref 3.5–5.2)
Alkaline Phosphatase: 53 U/L (ref 39–117)
BUN: 20 mg/dL (ref 6–23)
CO2: 29 meq/L (ref 19–32)
Calcium: 9.5 mg/dL (ref 8.4–10.5)
Chloride: 104 mEq/L (ref 96–112)
Creatinine, Ser: 0.83 mg/dL (ref 0.40–1.50)
GFR: 113.11 mL/min (ref >60.00)
Glucose, Bld: 91 mg/dL (ref 70–99)
Potassium: 4.1 mEq/L (ref 3.5–5.1)
Sodium: 140 mEq/L (ref 135–145)
Total Bilirubin: 0.3 mg/dL (ref 0.2–1.2)
Total Protein: 7.7 g/dL (ref 6.0–8.3)

## 2018-04-30 LAB — MAGNESIUM: Magnesium: 1.8 mg/dL (ref 1.5–2.5)

## 2018-04-30 MED ORDER — ONDANSETRON HCL 4 MG PO TABS: 4.0000 mg | ORAL_TABLET | Freq: Three times a day (TID) | ORAL | 1 refills | Status: DC | PRN

## 2018-04-30 NOTE — Patient Instructions (Signed)
Weakness °Weakness is a lack of strength. You may feel weak all over your body (generalized), or you may feel weak in one specific part of your body (focal). Common causes of weakness include: °· Infection and immune system disorders. °· Physical exhaustion. °· Internal bleeding or other blood loss that results in a lack of red blood cells (anemia). °· Dehydration. °· An imbalance in mineral (electrolyte) levels, such as potassium. °· Heart disease, circulation problems, or stroke. °Other causes include: °· Some medicines or cancer treatment. °· Stress, anxiety, or depression. °· Nervous system disorders. °· Thyroid disorders. °· Loss of muscle strength because of age or inactivity. °· Poor sleep quality or sleep disorders. °The cause of your weakness may not be known. Some causes of weakness can be serious, so it is important to see your health care provider. °Follow these instructions at home: °Activity °· Rest as needed. °· Try to get enough sleep. Most adults need 7-8 hours of quality sleep each night. Talk to your health care provider about how much sleep you need each night. °· Do exercises, such as arm curls and leg raises, for 30 minutes at least 2 days a week or as told by your health care provider. This helps build muscle strength. °· Consider working with a physical therapist or trainer who can develop an exercise plan to help you gain muscle strength. °General instructions ° °· Take over-the-counter and prescription medicines only as told by your health care provider. °· Eat a healthy, well-balanced diet. This includes: °? Proteins to build muscles, such as lean meats and fish. °? Fresh fruits and vegetables. °? Carbohydrates to boost energy, such as whole grains. °· Drink enough fluid to keep your urine pale yellow. °· Keep all follow-up visits as told by your health care provider. This is important. °Contact a health care provider if your weakness: °· Does not improve or gets worse. °· Affects your  ability to think clearly. °· Affects your ability to do your normal daily activities. °Get help right away if you: °· Develop sudden weakness, especially on one side of your face or body. °· Have chest pain. °· Have trouble breathing or shortness of breath. °· Have problems with your vision. °· Have trouble talking or swallowing. °· Have trouble standing or walking. °· Are light-headed or lose consciousness. °Summary °· Weakness is a lack of strength. You may feel weak all over your body or just in one specific part of your body. °· Weakness can be caused by a variety of things. In some cases, the cause may be unknown. °· Rest as needed, and try to get enough sleep. Most adults need 7-8 hours of quality sleep each night. °· Eat a healthy, well-balanced diet. °This information is not intended to replace advice given to you by your health care provider. Make sure you discuss any questions you have with your health care provider. °Document Released: 03/27/2005 Document Revised: 10/31/2017 Document Reviewed: 10/31/2017 °Elsevier Interactive Patient Education © 2019 [employer] ° °

## 2018-04-30 NOTE — Progress Notes (Signed)
Chief Complaint  Patient presents with  . Hospitalization Follow-up   HFU for rigors, hypotension, sweating, n/v, falls s/p injection 04/17/2018 with preferred pain management and spine care in [place] c/w sepsis s/p injection and CT lumbar negative for OM/discitis + gas at injection site on the left where pt states he had injection. He was hosp 1/9/-04/23/2018 and has appt with pain clinic tomorrow but does not want any further injections. He was given Vanc and Cefepime and was on 7 days of Augmentin and still feels week last day of Augmentin is today. He still feels unstable with gait has a cane though not walking with it today  Review of Systems  Constitutional: Positive for chills and weight loss. Negative for fever.  HENT: Negative for hearing loss.   Eyes: Negative for blurred vision.  Respiratory: Negative for shortness of breath.   Cardiovascular: Negative for chest pain.  Gastrointestinal: Positive for nausea. Negative for vomiting.  Musculoskeletal: Positive for back pain and joint pain. Negative for falls.  Skin: Negative for rash.  Neurological: Positive for weakness.  Psychiatric/Behavioral: Negative for memory loss.   Past Medical History:  Diagnosis Date  . Allergy   . CAD (coronary artery disease)   . Depression   . Frequent headaches   . GERD (gastroesophageal reflux disease)   . Headache   . Hyperlipidemia   . Hypertension   . PAD (peripheral artery disease) (HCC)   . Tobacco abuse    Past Surgical History:  Procedure Laterality Date  . COLONOSCOPY WITH PROPOFOL N/A 06/20/2017   Procedure: COLONOSCOPY WITH PROPOFOL;  Surgeon: Wyline Mood, MD;  Location: [facility] ENDOSCOPY;  Service: Gastroenterology;  Laterality: N/A;  . COLONOSCOPY WITH PROPOFOL N/A 07/24/2017   Procedure: COLONOSCOPY WITH PROPOFOL;  Surgeon: Wyline Mood, MD;  Location: [facility] ENDOSCOPY;  Service: Gastroenterology;  Laterality: N/A;  . ESOPHAGOGASTRODUODENOSCOPY (EGD) WITH PROPOFOL N/A 06/20/2017   Procedure:  ESOPHAGOGASTRODUODENOSCOPY (EGD) WITH PROPOFOL;  Surgeon: Wyline Mood, MD;  Location: [facility] ENDOSCOPY;  Service: Gastroenterology;  Laterality: N/A;  . PERCUTANEOUS CORONARY STENT INTERVENTION (PCI-S)    . STOMACH SURGERY     2009 [facility] per chart review pt had AAA repair   . TONSILLECTOMY     Family History  Problem Relation Age of Onset  . Diabetes Mother   . Heart disease Mother   . Hypertension Mother   . Stroke Mother   . Prostate cancer Father   . Cancer Father        prostate cancer in 38s  . Diabetes Sister   . Heart disease Sister   . Ulcers Other        unknown who had ulcers in family    Social History   Socioeconomic History  . Marital status: Married    Spouse name: Not on file  . Number of children: Not on file  . Years of education: Not on file  . Highest education level: Not on file  Occupational History  . Not on file  Social Needs  . Financial resource strain: Not hard at all  . Food insecurity:    Worry: Never true    Inability: Never true  . Transportation needs:    Medical: No    Non-medical: No  Tobacco Use  . Smoking status: Current Every Day Smoker    Packs/day: 0.25    Types: Cigarettes  . Smokeless tobacco: Never Used  Substance and Sexual Activity  . Alcohol use: No    Alcohol/week: 0.0 standard drinks  Comment: quit 1 year ago  . Drug use: No  . Sexual activity: Not Currently  Lifestyle  . Physical activity:    Days per week: Not on file    Minutes per session: Not on file  . Stress: Not on file  Relationships  . Social connections:    Talks on phone: Not on file    Gets together: Not on file    Attends religious service: Not on file    Active member of club or organization: Not on file    Attends meetings of clubs or organizations: Not on file    Relationship status: Not on file  . Intimate partner violence:    Fear of current or ex partner: No    Emotionally abused: No    Physically abused: No    Forced sexual activity: No   Other Topics Concern  . Not on file  Social History Narrative   Disabled    4 kids 2 living    Used to do mill work    Smoker since age 89 max 2 pk per week now 1 ppd as of 02/2017    Married    Current Meds  Medication Sig  . amLODipine (NORVASC) 2.5 MG tablet Take 2.5 mg by mouth daily.   Marland Kitchen amoxicillin-clavulanate (AUGMENTIN) 875-125 MG tablet Take 1 tablet by mouth 2 (two) times daily for 7 days.  Marland Kitchen aspirin 81 MG tablet Take 81 mg by mouth daily.  Marland Kitchen atorvastatin (LIPITOR) 80 MG tablet Take 1 tablet (80 mg total) by mouth daily at 6 PM.  . baclofen (LIORESAL) 10 MG tablet Take 0.5 tablets (5 mg total) by mouth 2 (two) times daily.  . carbamide peroxide (DEBROX) 6.5 % OTIC solution Place 5 drops into the right ear 2 (two) times daily. X 1 week  . cetirizine (ZYRTEC) 10 MG tablet Take 1 tablet (10 mg total) by mouth daily. At night  . Cholecalciferol 1.25 MG (50000 UT) capsule Take 1 capsule (50,000 Units total) by mouth once a week.  . citalopram (CELEXA) 10 MG tablet Take 1 tablet (10 mg total) by mouth daily.  . clopidogrel (PLAVIX) 75 MG tablet 1 pill daily  . fluticasone (FLONASE) 50 MCG/ACT nasal spray Place 1-2 sprays into both nostrils daily.  Marland Kitchen gabapentin (NEURONTIN) 300 MG capsule Take 1 capsule (300 mg total) by mouth 3 (three) times daily as needed (neuropathic pain).  Marland Kitchen ipratropium (ATROVENT) 0.02 % nebulizer solution Inhale into the lungs.  . isosorbide mononitrate (IMDUR) 30 MG 24 hr tablet Take 60 mg by mouth 2 (two) times daily.   Marland Kitchen lisinopril (PRINIVIL,ZESTRIL) 5 MG tablet 1 pill daily  . metoprolol succinate (TOPROL-XL) 25 MG 24 hr tablet Take 1 tablet (25 mg total) by mouth daily.  . mirtazapine (REMERON) 30 MG tablet 1 pill nightly  . nitroGLYCERIN (NITROSTAT) 0.4 MG SL tablet Place 1 tablet (0.4 mg total) under the tongue every 5 (five) minutes as needed for chest pain.  Marland Kitchen oxyCODONE-acetaminophen (PERCOCET) 10-325 MG tablet   . pantoprazole (PROTONIX) 40 MG  tablet Take 1 tablet (40 mg total) by mouth daily. 30 minutes before food  . Polyethylene Glycol 3350 (PEG 3350) POWD Please take this medication 1 to 3 times daily so that you have bowel movements everyday.  . pyridOXINE (VITAMIN B-6) 100 MG tablet Take 1 tablet (100 mg total) by mouth daily.  . sildenafil (REVATIO) 20 MG tablet 1-5 pills as needed before sex 30 minutes to 4 hours  .  zolpidem (AMBIEN) 10 MG tablet Take 1 tablet (10 mg total) by mouth at bedtime as needed for sleep.   No Known Allergies Recent Results (from the past 2160 hour(s))  TSH     Status: None   Collection Time: 02/27/18  9:42 AM  Result Value Ref Range   TSH 1.15 0.35 - 4.50 uIU/mL  PSA     Status: None   Collection Time: 02/27/18  9:42 AM  Result Value Ref Range   PSA 1.67 0.10 - 4.00 ng/mL    Comment: Test performed using Access Hybritech PSA Assay, a parmagnetic partical, chemiluminecent immunoassay.  Urinalysis, Routine w reflex microscopic     Status: Abnormal   Collection Time: 02/27/18  9:42 AM  Result Value Ref Range   Color, Urine YELLOW YELLOW   APPearance CLEAR CLEAR   Specific Gravity, Urine 1.017 1.001 - 1.03   pH 5.5 5.0 - 8.0   Glucose, UA TRACE (A) NEGATIVE   Bilirubin Urine NEGATIVE NEGATIVE   Ketones, ur NEGATIVE NEGATIVE   Hgb urine dipstick NEGATIVE NEGATIVE   Protein, ur NEGATIVE NEGATIVE   Nitrite NEGATIVE NEGATIVE   Leukocytes, UA NEGATIVE NEGATIVE  Vitamin D (25 hydroxy)     Status: Abnormal   Collection Time: 02/27/18  9:42 AM  Result Value Ref Range   VITD 18.49 (L) 30.00 - 100.00 ng/mL  Urine Culture     Status: None   Collection Time: 02/27/18  9:42 AM  Result Value Ref Range   MICRO NUMBER: [id number]    SPECIMEN QUALITY: Adequate    Sample Source NOT GIVEN    STATUS: FINAL    Result: No Growth   Lipase, blood     Status: None   Collection Time: 04/18/18  1:32 PM  Result Value Ref Range   Lipase 20 11 - 51 U/L    Comment: Performed at [facility], [address] [place], [place] [address]  Comprehensive metabolic panel     Status: Abnormal   Collection Time: 04/18/18  1:32 PM  Result Value Ref Range   Sodium 137 135 - 145 mmol/L   Potassium 3.2 (L) 3.5 - 5.1 mmol/L   Chloride 104 98 - 111 mmol/L   CO2 23 22 - 32 mmol/L   Glucose, Bld 86 70 - 99 mg/dL   BUN 18 8 - 23 mg/dL   Creatinine, Ser 1.190.72 0.61 - 1.24 mg/dL   Calcium 9.0 8.9 - 14.710.3 mg/dL   Total Protein 7.6 6.5 - 8.1 g/dL   Albumin 4.7 3.5 - 5.0 g/dL   AST 27 15 - 41 U/L   ALT 17 0 - 44 U/L   Alkaline Phosphatase 51 38 - 126 U/L   Total Bilirubin 0.5 0.3 - 1.2 mg/dL   GFR calc non Af Amer >60 >60 mL/min   GFR calc Af Amer >60 >60 mL/min   Anion gap 10 5 - 15    Comment: Performed at [facility], [address] [place], [place] [address]  CBC     Status: Abnormal   Collection Time: 04/18/18  1:32 PM  Result Value Ref Range   WBC 16.4 (H) 4.0 - 10.5 K/uL   RBC 4.75 4.22 - 5.81 MIL/uL   Hemoglobin 14.2 13.0 - 17.0 g/dL   HCT 21.344.1 08.639.0 - 57.852.0 %   MCV 92.8 80.0 - 100.0 fL   MCH 29.9 26.0 - 34.0 pg   MCHC 32.2 30.0 - 36.0 g/dL   RDW 46.915.5 62.911.5 -  15.5 %   Platelets 235 150 - 400 K/uL   nRBC 0.0 0.0 - 0.2 %    Comment: Performed at [facility], [address] [place], [place] [address]  Urinalysis, Complete w Microscopic     Status: Abnormal   Collection Time: 04/18/18  1:32 PM  Result Value Ref Range   Color, Urine YELLOW (A) YELLOW   APPearance CLEAR (A) CLEAR   Specific Gravity, Urine 1.018 1.005 - 1.030   pH 5.0 5.0 - 8.0   Glucose, UA NEGATIVE NEGATIVE mg/dL   Hgb urine dipstick SMALL (A) NEGATIVE   Bilirubin Urine NEGATIVE NEGATIVE   Ketones, ur NEGATIVE NEGATIVE mg/dL   Protein, ur NEGATIVE NEGATIVE mg/dL   Nitrite NEGATIVE NEGATIVE   Leukocytes, UA NEGATIVE NEGATIVE   RBC / HPF 0-5 0 - 5 RBC/hpf   WBC, UA 0-5 0 - 5 WBC/hpf   Bacteria, UA NONE SEEN NONE SEEN   Squamous Epithelial / LPF 0-5 0 - 5   Mucus PRESENT     Comment:  Performed at [facility], [address] [place], [place] [address]  Troponin I - ONCE - STAT     Status: None   Collection Time: 04/18/18  1:32 PM  Result Value Ref Range   Troponin I <0.03 <0.03 ng/mL    Comment: Performed at [facility], [address] [place], [place] [address]  Influenza panel by PCR (type A & B)     Status: None   Collection Time: 04/18/18  1:32 PM  Result Value Ref Range   Influenza A By PCR NEGATIVE NEGATIVE   Influenza B By PCR NEGATIVE NEGATIVE    Comment: (NOTE) The Xpert Xpress Flu assay is intended as an aid in the diagnosis of  influenza and should not be used as a sole basis for treatment.  This  assay is FDA approved for nasopharyngeal swab specimens only. Nasal  washings and aspirates are unacceptable for Xpert Xpress Flu testing. Performed at [facility], [address] [place], [place] [address]   Blood Culture (routine x 2)     Status: None   Collection Time: 04/18/18  4:35 PM  Result Value Ref Range   Specimen Description BLOOD BLOOD RIGHT FOREARM    Special Requests      BOTTLES DRAWN AEROBIC AND ANAEROBIC Blood Culture results may not be optimal due to an inadequate volume of blood received in culture bottles   Culture      NO GROWTH 5 DAYS Performed at [facility], [address], [place], [place] [address]    Report Status 04/23/2018 FINAL   CG4 I-STAT (Lactic acid)     Status: None   Collection Time: 04/18/18  4:44 PM  Result Value Ref Range   Lactic Acid, Venous 1.38 0.5 - 1.9 mmol/L  Blood Culture (routine x 2)     Status: None   Collection Time: 04/18/18  4:45 PM  Result Value Ref Range   Specimen Description BLOOD LEFT ANTECUBITAL    Special Requests      BOTTLES DRAWN AEROBIC AND ANAEROBIC Blood Culture adequate volume   Culture      NO GROWTH 5 DAYS Performed at [facility], [address] [place], [place] [address]    Report Status 04/23/2018 FINAL   Urine Culture      Status: None   Collection Time: 04/18/18  5:58 PM  Result Value Ref Range   Specimen Description URINE, RANDOM  Special Requests      NONE Performed at [facility], [address] [place], [place] [address]    Culture NO GROWTH    Report Status 04/20/2018 FINAL   Procalcitonin - Baseline     Status: None   Collection Time: 04/18/18 10:46 PM  Result Value Ref Range   Procalcitonin 15.42 ng/mL    Comment:        Interpretation: PCT >= 10 ng/mL: Important systemic inflammatory response, almost exclusively due to severe bacterial sepsis or septic shock. (NOTE)       Sepsis PCT Algorithm           Lower Respiratory Tract                                      Infection PCT Algorithm    ----------------------------     ----------------------------         PCT < 0.25 ng/mL                PCT < 0.10 ng/mL         Strongly encourage             Strongly discourage   discontinuation of antibiotics    initiation of antibiotics    ----------------------------     -----------------------------       PCT 0.25 - 0.50 ng/mL            PCT 0.10 - 0.25 ng/mL               OR       >80% decrease in PCT            Discourage initiation of                                            antibiotics      Encourage discontinuation           of antibiotics    ----------------------------     -----------------------------         PCT >= 0.50 ng/mL              PCT 0.26 - 0.50 ng/mL                AND       <80% decrease in PCT             Encourage initiation of                                             antibiotics       Encourage continuation           of antibiotics    ----------------------------     -----------------------------        PCT >= 0.50 ng/mL                  PCT > 0.50 ng/mL               AND         increase in PCT                  Strongly encourage  initiation of antibiotics    Strongly encourage escalation           of  antibiotics                                     -----------------------------                                           PCT <= 0.25 ng/mL                                                 OR                                        > 80% decrease in PCT                                     Discontinue / Do not initiate                                             antibiotics Performed at [facility] [place] [facility], [address] [place], [place] [address]   Basic metabolic panel     Status: Abnormal   Collection Time: 04/19/18  3:21 AM  Result Value Ref Range   Sodium 139 135 - 145 mmol/L   Potassium 3.6 3.5 - 5.1 mmol/L   Chloride 106 98 - 111 mmol/L   CO2 26 22 - 32 mmol/L   Glucose, Bld 101 (H) 70 - 99 mg/dL   BUN 18 8 - 23 mg/dL   Creatinine, Ser 6.04 0.61 - 1.24 mg/dL   Calcium 8.3 (L) 8.9 - 10.3 mg/dL   GFR calc non Af Amer >60 >60 mL/min   GFR calc Af Amer >60 >60 mL/min   Anion gap 7 5 - 15    Comment: Performed at [facility], [address] [place], [place] [address]  CBC     Status: Abnormal   Collection Time: 04/19/18  3:21 AM  Result Value Ref Range   WBC 23.5 (H) 4.0 - 10.5 K/uL   RBC 4.45 4.22 - 5.81 MIL/uL   Hemoglobin 13.1 13.0 - 17.0 g/dL   HCT 11.9 14.7 - 82.9 %   MCV 91.9 80.0 - 100.0 fL   MCH 29.4 26.0 - 34.0 pg   MCHC 32.0 30.0 - 36.0 g/dL   RDW 56.2 (H) 13.0 - 86.5 %   Platelets 218 150 - 400 K/uL   nRBC 0.0 0.0 - 0.2 %    Comment: Performed at [facility], [address] [place], [place] [address]  Procalcitonin     Status: None   Collection Time: 04/19/18  3:21 AM  Result Value Ref Range   Procalcitonin 13.56 ng/mL    Comment:        Interpretation: PCT >= 10 ng/mL: Important systemic inflammatory response, almost exclusively due to  severe bacterial sepsis or septic shock. (NOTE)       Sepsis PCT Algorithm           Lower Respiratory Tract                                      Infection PCT Algorithm     ----------------------------     ----------------------------         PCT < 0.25 ng/mL                PCT < 0.10 ng/mL         Strongly encourage             Strongly discourage   discontinuation of antibiotics    initiation of antibiotics    ----------------------------     -----------------------------       PCT 0.25 - 0.50 ng/mL            PCT 0.10 - 0.25 ng/mL               OR       >80% decrease in PCT            Discourage initiation of                                            antibiotics      Encourage discontinuation           of antibiotics    ----------------------------     -----------------------------         PCT >= 0.50 ng/mL              PCT 0.26 - 0.50 ng/mL                AND       <80% decrease in PCT             Encourage initiation of                                             antibiotics       Encourage continuation           of antibiotics    ----------------------------     -----------------------------        PCT >= 0.50 ng/mL                  PCT > 0.50 ng/mL               AND         increase in PCT                  Strongly encourage                                      initiation of antibiotics    Strongly encourage escalation           of antibiotics                                     -----------------------------  PCT <= 0.25 ng/mL                                                 OR                                        > 80% decrease in PCT                                     Discontinue / Do not initiate                                             antibiotics Performed at [facility], [address] [place], [place] [address]   Creatinine, serum     Status: None   Collection Time: 04/20/18  3:37 AM  Result Value Ref Range   Creatinine, Ser 0.83 0.61 - 1.24 mg/dL   GFR calc non Af Amer >60 >60 mL/min   GFR calc Af Amer >60 >60 mL/min    Comment: Performed at [facility], [address] [place], [place] [address]  CBC     Status: Abnormal   Collection Time: 04/20/18  3:37 AM  Result Value Ref Range   WBC 15.9 (H) 4.0 - 10.5 K/uL   RBC 4.28 4.22 - 5.81 MIL/uL   Hemoglobin 12.5 (L) 13.0 - 17.0 g/dL   HCT 82.9 56.2 - 13.0 %   MCV 92.8 80.0 - 100.0 fL   MCH 29.2 26.0 - 34.0 pg   MCHC 31.5 30.0 - 36.0 g/dL   RDW 86.5 (H) 78.4 - 69.6 %   Platelets 208 150 - 400 K/uL   nRBC 0.0 0.0 - 0.2 %    Comment: Performed at [facility], [address] [place], [place] [address]  Troponin I - ONCE - STAT     Status: Abnormal   Collection Time: 04/20/18  8:49 AM  Result Value Ref Range   Troponin I 0.03 (HH) <0.03 ng/mL    Comment: CRITICAL RESULT CALLED TO, READ BACK BY AND VERIFIED WITH LIZ HAYS 04/20/18 0933 KBH Performed at [facility], [address] [place], [place] [address]   Troponin I - Now Then Q6H     Status: Abnormal   Collection Time: 04/20/18  2:48 PM  Result Value Ref Range   Troponin I 0.04 (HH) <0.03 ng/mL    Comment: CRITICAL VALUE NOTED. VALUE IS CONSISTENT WITH PREVIOUSLY REPORTED/CALLED VALUE.MSS Performed at [facility], [address] [place], [place] [address]   Troponin I - Now Then Q6H     Status: Abnormal   Collection Time: 04/20/18  8:43 PM  Result Value Ref Range   Troponin I 0.03 (HH) <0.03 ng/mL    Comment: CRITICAL VALUE NOTED. VALUE IS CONSISTENT WITH PREVIOUSLY REPORTED/CALLED VALUE. QSD Performed at [facility], [address] [place], [place] [address]   Troponin I - Now Then Q6H     Status: None   Collection Time: 04/21/18  2:29 AM  Result Value Ref Range  Troponin I <0.03 <0.03 ng/mL    Comment: Performed at [facility], [address] [place], [place] [address]  CBC     Status: Abnormal   Collection Time: 04/21/18  8:54 AM  Result Value Ref Range   WBC 6.2 4.0 - 10.5 K/uL   RBC 4.03 (L) 4.22 - 5.81 MIL/uL   Hemoglobin 11.9 (L) 13.0 - 17.0 g/dL   HCT 60.4 (L)  54.0 - 52.0 %   MCV 93.8 80.0 - 100.0 fL   MCH 29.5 26.0 - 34.0 pg   MCHC 31.5 30.0 - 36.0 g/dL   RDW 98.1 (H) 19.1 - 47.8 %   Platelets 202 150 - 400 K/uL   nRBC 0.0 0.0 - 0.2 %    Comment: Performed at [facility], [address] [place], [place] [address]  C-reactive protein     Status: Abnormal   Collection Time: 04/22/18  3:20 AM  Result Value Ref Range   CRP 3.3 (H) <1.0 mg/dL    Comment: Performed at [facility], [address] [place], [place] [address]  CBC with Differential/Platelet     Status: Abnormal   Collection Time: 04/22/18  3:20 AM  Result Value Ref Range   WBC 5.3 4.0 - 10.5 K/uL   RBC 4.17 (L) 4.22 - 5.81 MIL/uL   Hemoglobin 12.2 (L) 13.0 - 17.0 g/dL   HCT 57.8 (L) 46.9 - 62.9 %   MCV 91.4 80.0 - 100.0 fL   MCH 29.3 26.0 - 34.0 pg   MCHC 32.0 30.0 - 36.0 g/dL   RDW 52.8 41.3 - 24.4 %   Platelets 240 150 - 400 K/uL   nRBC 0.0 0.0 - 0.2 %   Neutrophils Relative % 48 %   Neutro Abs 2.5 1.7 - 7.7 K/uL   Lymphocytes Relative 37 %   Lymphs Abs 2.0 0.7 - 4.0 K/uL   Monocytes Relative 13 %   Monocytes Absolute 0.7 0.1 - 1.0 K/uL   Eosinophils Relative 2 %   Eosinophils Absolute 0.1 0.0 - 0.5 K/uL   Basophils Relative 0 %   Basophils Absolute 0.0 0.0 - 0.1 K/uL   Immature Granulocytes 0 %   Abs Immature Granulocytes 0.01 0.00 - 0.07 K/uL    Comment: Performed at [facility], [address] [place], [place] [address]  Comprehensive metabolic panel     Status: Abnormal   Collection Time: 04/22/18  3:20 AM  Result Value Ref Range   Sodium 142 135 - 145 mmol/L   Potassium 3.7 3.5 - 5.1 mmol/L   Chloride 108 98 - 111 mmol/L   CO2 26 22 - 32 mmol/L   Glucose, Bld 90 70 - 99 mg/dL   BUN 13 8 - 23 mg/dL   Creatinine, Ser 2.53 0.61 - 1.24 mg/dL   Calcium 8.6 (L) 8.9 - 10.3 mg/dL   Total Protein 6.7 6.5 - 8.1 g/dL   Albumin 3.6 3.5 - 5.0 g/dL   AST 47 (H) 15 - 41 U/L   ALT 24 0 - 44 U/L   Alkaline Phosphatase 47 38 - 126 U/L    Total Bilirubin 0.6 0.3 - 1.2 mg/dL   GFR calc non Af Amer >60 >60 mL/min   GFR calc Af Amer >60 >60 mL/min   Anion gap 8 5 - 15    Comment: Performed at [facility], [address], [place], [place] [address]  ECHOCARDIOGRAM COMPLETE     Status: None  Collection Time: 04/22/18  8:27 AM  Result Value Ref Range   Weight 2,288 oz   Height 67 in   BP 127/86 mmHg  Vancomycin, peak     Status: Abnormal   Collection Time: 04/22/18  9:34 AM  Result Value Ref Range   Vancomycin Pk 23 (L) 30 - 40 ug/mL    Comment: Performed at [facility], [address] [place], [place] [address]  Vancomycin, trough     Status: Abnormal   Collection Time: 04/23/18  5:35 AM  Result Value Ref Range   Vancomycin Tr 5 (L) 15 - 20 ug/mL    Comment: Performed at [facility], [address] [place], [place] [address]  Procalcitonin - Baseline     Status: None   Collection Time: 04/23/18  5:35 AM  Result Value Ref Range   Procalcitonin 1.28 ng/mL    Comment:        Interpretation: PCT > 0.5 ng/mL and <= 2 ng/mL: Systemic infection (sepsis) is possible, but other conditions are known to elevate PCT as well. (NOTE)       Sepsis PCT Algorithm           Lower Respiratory Tract                                      Infection PCT Algorithm    ----------------------------     ----------------------------         PCT < 0.25 ng/mL                PCT < 0.10 ng/mL         Strongly encourage             Strongly discourage   discontinuation of antibiotics    initiation of antibiotics    ----------------------------     -----------------------------       PCT 0.25 - 0.50 ng/mL            PCT 0.10 - 0.25 ng/mL               OR       >80% decrease in PCT            Discourage initiation of                                            antibiotics      Encourage discontinuation           of antibiotics    ----------------------------     -----------------------------         PCT >= 0.50  ng/mL              PCT 0.26 - 0.50 ng/mL                AND       <80% decrease in PCT             Encourage initiation of                                             antibiotics       Encourage continuation  of antibiotics    ----------------------------     -----------------------------        PCT >= 0.50 ng/mL                  PCT > 0.50 ng/mL               AND         increase in PCT                  Strongly encourage                                      initiation of antibiotics    Strongly encourage escalation           of antibiotics                                     -----------------------------                                           PCT <= 0.25 ng/mL                                                 OR                                        > 80% decrease in PCT                                     Discontinue / Do not initiate                                             antibiotics Performed at [facility], [address] [place], [place] [address]    Objective  Body mass index is 21.3 kg/m. Wt Readings from Last 3 Encounters:  04/30/18 136 lb (61.7 kg)  04/18/18 143 lb (64.9 kg)  02/27/18 125 lb (56.7 kg)   Temp Readings from Last 3 Encounters:  04/30/18 97.6 F (36.4 C) (Oral)  04/23/18 98.3 F (36.8 C) (Oral)  02/27/18 98.1 F (36.7 C) (Oral)   BP Readings from Last 3 Encounters:  04/30/18 120/88  04/23/18 114/85  02/27/18 122/72   Pulse Readings from Last 3 Encounters:  04/30/18 80  04/23/18 82  02/27/18 66    Physical Exam Vitals signs and nursing note reviewed.  Constitutional:      Appearance: Normal appearance. He is well-developed and well-groomed.  HENT:     Head: Normocephalic and atraumatic.     Nose: Nose normal.     Mouth/Throat:     Mouth: Mucous membranes are moist.     Pharynx: Oropharynx is clear.  Eyes:     Conjunctiva/sclera: Conjunctivae normal.     Pupils: Pupils are equal,  round, and reactive to  light.  Cardiovascular:     Rate and Rhythm: Normal rate and regular rhythm.     Heart sounds: Normal heart sounds.  Pulmonary:     Effort: Pulmonary effort is normal.     Breath sounds: Normal breath sounds.  Skin:    General: Skin is warm and dry.  Neurological:     General: No focal deficit present.     Mental Status: He is alert and oriented to person, place, and time. Mental status is at baseline.     Gait: Gait normal.  Psychiatric:        Attention and Perception: Attention and perception normal.        Mood and Affect: Mood and affect normal.        Speech: Speech normal.        Behavior: Behavior normal. Behavior is cooperative.        Thought Content: Thought content normal.        Cognition and Memory: Cognition and memory normal.        Judgment: Judgment normal.     Assessment   1. HFU for sepsis and +gas on CT left s/p injection reaction from injection on 04/17/2018  Plan   1. Pt prefers not to have further injections for chronic pain will let pain clinic know  Labs today CMET, CBC, mag  F/u as sch 05/2018  rec walk with cane  Still having nausea prn zofran  Blood cultures and urine cultures neg   Provider: Dr. French Ana McLean-Scocuzza-Internal Medicine

## 2018-04-30 NOTE — Progress Notes (Signed)
Pre visit review using our clinic review tool, if applicable. No additional management support is needed unless otherwise documented below in the visit note. 

## 2018-05-01 DIAGNOSIS — M47817 Spondylosis without myelopathy or radiculopathy, lumbosacral region: Secondary | ICD-10-CM | POA: Diagnosis not present

## 2018-05-01 DIAGNOSIS — M5136 Other intervertebral disc degeneration, lumbar region: Secondary | ICD-10-CM | POA: Diagnosis not present

## 2018-05-01 DIAGNOSIS — G894 Chronic pain syndrome: Secondary | ICD-10-CM | POA: Diagnosis not present

## 2018-05-06 NOTE — Progress Notes (Signed)
Note sent to Pain management

## 2018-05-20 ENCOUNTER — Telehealth: Payer: Self-pay

## 2018-05-20 NOTE — Telephone Encounter (Signed)
Copied from CRM [id number]. Topic: General - Other >> May 20, 2018  8:38 AM Jay Schlichter wrote: Reason for CRM: Lawanna Kobus from [facility] called - she received medical records request, and has questions about it. Please call [phone]

## 2018-05-20 NOTE — Telephone Encounter (Signed)
Spoken to angel, she wanted to know if that was the only thing on ROI request.

## 2018-05-22 DIAGNOSIS — Z79899 Other long term (current) drug therapy: Secondary | ICD-10-CM | POA: Diagnosis not present

## 2018-05-22 DIAGNOSIS — M545 Low back pain: Secondary | ICD-10-CM | POA: Diagnosis not present

## 2018-05-22 DIAGNOSIS — G894 Chronic pain syndrome: Secondary | ICD-10-CM | POA: Diagnosis not present

## 2018-05-22 DIAGNOSIS — Z79891 Long term (current) use of opiate analgesic: Secondary | ICD-10-CM | POA: Diagnosis not present

## 2018-05-22 DIAGNOSIS — M47816 Spondylosis without myelopathy or radiculopathy, lumbar region: Secondary | ICD-10-CM | POA: Diagnosis not present

## 2018-05-26 ENCOUNTER — Other Ambulatory Visit: Payer: Self-pay | Admitting: Internal Medicine

## 2018-05-26 DIAGNOSIS — G4700 Insomnia, unspecified: Secondary | ICD-10-CM

## 2018-05-26 MED ORDER — ZOLPIDEM TARTRATE 10 MG PO TABS: 10.0000 mg | ORAL_TABLET | Freq: Every evening | ORAL | 5 refills | Status: DC | PRN

## 2018-05-30 ENCOUNTER — Encounter: Payer: Self-pay | Admitting: Internal Medicine

## 2018-05-30 ENCOUNTER — Ambulatory Visit (INDEPENDENT_AMBULATORY_CARE_PROVIDER_SITE_OTHER): Payer: Medicare HMO | Admitting: Internal Medicine

## 2018-05-30 VITALS — BP 134/72 | HR 78 | Temp 98.2°F | Ht 67.0 in | Wt 134.4 lb

## 2018-05-30 DIAGNOSIS — K056 Periodontal disease, unspecified: Secondary | ICD-10-CM

## 2018-05-30 DIAGNOSIS — K047 Periapical abscess without sinus: Secondary | ICD-10-CM

## 2018-05-30 DIAGNOSIS — R110 Nausea: Secondary | ICD-10-CM | POA: Diagnosis not present

## 2018-05-30 DIAGNOSIS — L299 Pruritus, unspecified: Secondary | ICD-10-CM

## 2018-05-30 DIAGNOSIS — Z23 Encounter for immunization: Secondary | ICD-10-CM | POA: Diagnosis not present

## 2018-05-30 HISTORY — DX: Periapical abscess without sinus: K04.7

## 2018-05-30 HISTORY — DX: Periodontal disease, unspecified: K05.6

## 2018-05-30 MED ORDER — ONDANSETRON HCL 4 MG PO TABS: 4.0000 mg | ORAL_TABLET | Freq: Two times a day (BID) | ORAL | 2 refills | Status: DC | PRN

## 2018-05-30 MED ORDER — AMOXICILLIN-POT CLAVULANATE 875-125 MG PO TABS: 1.0000 | ORAL_TABLET | Freq: Two times a day (BID) | ORAL | 0 refills | Status: DC

## 2018-05-30 NOTE — Patient Instructions (Addendum)
[employer] [place] [phone]  [facility] [place] [phone] [facility] [employer] call for scholarship info dental [phone]      l  Dental Abscess  A dental abscess is a collection of pus in or around a tooth that results from an infection. An abscess can cause pain in the affected area as well as other symptoms. Treatment is important to help with symptoms and to prevent the infection from spreading. What are the causes? This condition is caused by a bacterial infection around the root of the tooth that involves the inner part of the tooth (pulp). It may result from:  Severe tooth decay.  Trauma to the tooth, such as a broken or chipped tooth, that allows bacteria to enter into the pulp.  Severe gum disease around a tooth. What increases the risk? This condition is more likely to develop in males. It is also more likely to develop in people who:  Have dental decay (cavities).  Eat sugary snacks between meals.  Use tobacco products.  Have diabetes.  Have a weakened disease-fighting system (immune system).  Do not brush and care for their teeth regularly. What are the signs or symptoms? Symptoms of this condition include:  Severe pain in and around the infected tooth.  Swelling and redness around the infected tooth, in the mouth, or in the face.  Tenderness.  Pus drainage.  Bad breath.  Bitter taste in the mouth.  Difficulty swallowing.  Difficulty opening the mouth.  Nausea.  Vomiting.  Chills.  Swollen neck glands.  Fever. How is this diagnosed? This condition is diagnosed based on:  Your symptoms and your medical and dental history.  An examination of the infected tooth. During the exam, your dentist may tap on the infected tooth. You may also have X-rays of the affected area. How is this treated? This condition is treated by getting rid of the infection. This may be done with:  Incision and drainage. This procedure is done by  making an incision in the abscess to drain out the pus. Removing pus is the first priority in treating an abscess.  Antibiotic medicines. These may be used in certain situations.  Antibacterial mouth rinse.  A root canal. This may be performed to save the tooth. Your dentist accesses the visible part of your tooth (crown) with a drill and removes any damaged pulp. Then the space is filled and sealed off.  Tooth extraction. The tooth is pulled out if it cannot be saved by other treatment. You may also receive treatment for pain, such as:  Acetaminophen or NSAIDs.  Gels that contain a numbing medicine.  An injection to block the pain near your nerve. Follow these instructions at home: Medicines  Take over-the-counter and prescription medicines only as told by your dentist.  If you were prescribed an antibiotic, take it as told by your dentist. Do not stop taking the antibiotic even if you start to feel better.  If you were prescribed a gel that contains a numbing medicine, use it exactly as told in the directions. Do not use these gels for children who are younger than 25 years of age.  Do not drive or use heavy machinery while taking prescription pain medicine. General instructions  Rinse out your mouth often with salt water to relieve pain or swelling. To make a salt-water mixture, completely dissolve -1 tsp of salt in 1 cup of warm water.  Eat a soft diet while your abscess is healing.  Drink enough fluid  to keep your urine pale yellow.  Do not apply heat to the outside of your mouth.  Do not use any products that contain nicotine or tobacco, such as cigarettes and e-cigarettes. If you need help quitting, ask your health care provider.  Keep all follow-up visits as told by your dentist. This is important. How is this prevented?  Brush your teeth every morning and night with fluoride toothpaste. Floss one time each day.  Get regularly scheduled dental cleanings.  Consider  having a dental sealant applied on teeth that have deep holes (caries).  Drink fluoridated water regularly. This includes most tap water. Check the label on bottled water to see if it contains fluoride.  Drink water instead of sugary drinks.  Eat healthy meals and snacks.  Wear a mouth guard or face shield to protect your teeth while playing sports. Contact a health care provider if:  Your pain is worse and is not helped by medicine. Get help right away if:  You have a fever or chills.  Your symptoms suddenly get worse.  You have a very bad headache.  You have problems breathing or swallowing.  You have trouble opening your mouth.  You have swelling in your neck or around your eye. Summary  A dental abscess is a collection of pus in or around a tooth that results from an infection.  A dental abscess may result from severe tooth decay, trauma to the tooth, or severe gum disease around a tooth.  Symptoms include severe pain, swelling, redness, and drainage of pus in and around the infected tooth.  The first priority in treating a dental abscess is to drain out the pus. Treatment may also involve removing damage inside the tooth (root canal) or pulling out (extracting) the tooth. This information is not intended to replace advice given to you by your health care provider. Make sure you discuss any questions you have with your health care provider. Document Released: 03/27/2005 Document Revised: 11/27/2016 Document Reviewed: 11/27/2016 Elsevier Interactive Patient Education  2019 [employer].

## 2018-05-30 NOTE — Progress Notes (Signed)
Pre visit review using our clinic review tool, if applicable. No additional management support is needed unless otherwise documented below in the visit note. 

## 2018-05-30 NOTE — Progress Notes (Signed)
Chief Complaint  Patient presents with  . Follow-up   F/u  1. Chronic nausea wants refill of zofran taking now from bid prn to qd prn for nausea and helps  2. Right ear itching intermittently w/o pain  3. C/o poor teeth and c/w abscess but cant afford dentist currently has to pay out of pocket given lists of dentists in the area for low income    Review of Systems  Constitutional: Negative for weight loss.  HENT:       +poor teeth    Eyes: Negative for blurred vision.  Respiratory: Negative for shortness of breath.   Cardiovascular: Negative for chest pain.  Gastrointestinal: Negative for abdominal pain.  Musculoskeletal: Negative for falls.  Skin: Negative for rash.  Neurological: Negative for headaches.  Psychiatric/Behavioral: Negative for depression.   Past Medical History:  Diagnosis Date  . Allergy   . CAD (coronary artery disease)   . Depression   . Frequent headaches   . GERD (gastroesophageal reflux disease)   . Headache   . Hyperlipidemia   . Hypertension   . PAD (peripheral artery disease) (HCC)   . Tobacco abuse    Past Surgical History:  Procedure Laterality Date  . COLONOSCOPY WITH PROPOFOL N/A 06/20/2017   Procedure: COLONOSCOPY WITH PROPOFOL;  Surgeon: Wyline Mood, MD;  Location: [facility] ENDOSCOPY;  Service: Gastroenterology;  Laterality: N/A;  . COLONOSCOPY WITH PROPOFOL N/A 07/24/2017   Procedure: COLONOSCOPY WITH PROPOFOL;  Surgeon: Wyline Mood, MD;  Location: [facility] ENDOSCOPY;  Service: Gastroenterology;  Laterality: N/A;  . ESOPHAGOGASTRODUODENOSCOPY (EGD) WITH PROPOFOL N/A 06/20/2017   Procedure: ESOPHAGOGASTRODUODENOSCOPY (EGD) WITH PROPOFOL;  Surgeon: Wyline Mood, MD;  Location: [facility] ENDOSCOPY;  Service: Gastroenterology;  Laterality: N/A;  . PERCUTANEOUS CORONARY STENT INTERVENTION (PCI-S)    . STOMACH SURGERY     [facility] per chart review pt had AAA repair   . TONSILLECTOMY     Family History  Problem Relation Age of Onset  . Diabetes Mother   .  Heart disease Mother   . Hypertension Mother   . Stroke Mother   . Prostate cancer Father   . Cancer Father        prostate cancer in 45s  . Diabetes Sister   . Heart disease Sister   . Ulcers Other        unknown who had ulcers in family    Social History   Socioeconomic History  . Marital status: Married    Spouse name: Not on file  . Number of children: Not on file  . Years of education: Not on file  . Highest education level: Not on file  Occupational History  . Not on file  Social Needs  . Financial resource strain: Not hard at all  . Food insecurity:    Worry: Never true    Inability: Never true  . Transportation needs:    Medical: No    Non-medical: No  Tobacco Use  . Smoking status: Current Every Day Smoker    Packs/day: 0.25    Types: Cigarettes  . Smokeless tobacco: Never Used  Substance and Sexual Activity  . Alcohol use: No    Alcohol/week: 0.0 standard drinks    Comment: quit 1 year ago  . Drug use: No  . Sexual activity: Not Currently  Lifestyle  . Physical activity:    Days per week: Not on file    Minutes per session: Not on file  . Stress: Not on file  Relationships  .  Social connections:    Talks on phone: Not on file    Gets together: Not on file    Attends religious service: Not on file    Active member of club or organization: Not on file    Attends meetings of clubs or organizations: Not on file    Relationship status: Not on file  . Intimate partner violence:    Fear of current or ex partner: No    Emotionally abused: No    Physically abused: No    Forced sexual activity: No  Other Topics Concern  . Not on file  Social History Narrative   Disabled    4 kids 2 living    Used to do mill work    Smoker since age 89 max 2 pk per week now 1 ppd as of 02/2017    Married    Current Meds  Medication Sig  . amLODipine (NORVASC) 2.5 MG tablet Take 2.5 mg by mouth daily.   Marland Kitchen aspirin 81 MG tablet Take 81 mg by mouth daily.  Marland Kitchen  atorvastatin (LIPITOR) 80 MG tablet Take 1 tablet (80 mg total) by mouth daily at 6 PM.  . baclofen (LIORESAL) 10 MG tablet Take 0.5 tablets (5 mg total) by mouth 2 (two) times daily.  . carbamide peroxide (DEBROX) 6.5 % OTIC solution Place 5 drops into the right ear 2 (two) times daily. X 1 week  . cetirizine (ZYRTEC) 10 MG tablet Take 1 tablet (10 mg total) by mouth daily. At night  . Cholecalciferol 1.25 MG (50000 UT) capsule Take 1 capsule (50,000 Units total) by mouth once a week.  . citalopram (CELEXA) 10 MG tablet Take 1 tablet (10 mg total) by mouth daily.  . clopidogrel (PLAVIX) 75 MG tablet 1 pill daily  . fluticasone (FLONASE) 50 MCG/ACT nasal spray Place 1-2 sprays into both nostrils daily.  Marland Kitchen gabapentin (NEURONTIN) 300 MG capsule Take 1 capsule (300 mg total) by mouth 3 (three) times daily as needed (neuropathic pain).  Marland Kitchen ipratropium (ATROVENT) 0.02 % nebulizer solution Inhale into the lungs.  . isosorbide mononitrate (IMDUR) 30 MG 24 hr tablet Take 60 mg by mouth 2 (two) times daily.   Marland Kitchen lisinopril (PRINIVIL,ZESTRIL) 5 MG tablet 1 pill daily  . metoprolol succinate (TOPROL-XL) 25 MG 24 hr tablet Take 1 tablet (25 mg total) by mouth daily.  . mirtazapine (REMERON) 30 MG tablet 1 pill nightly  . nitroGLYCERIN (NITROSTAT) 0.4 MG SL tablet Place 1 tablet (0.4 mg total) under the tongue every 5 (five) minutes as needed for chest pain.  Marland Kitchen ondansetron (ZOFRAN) 4 MG tablet Take 1 tablet (4 mg total) by mouth 2 (two) times daily as needed for nausea or vomiting.  Marland Kitchen oxyCODONE-acetaminophen (PERCOCET) 10-325 MG tablet   . pantoprazole (PROTONIX) 40 MG tablet Take 1 tablet (40 mg total) by mouth daily. 30 minutes before food  . Polyethylene Glycol 3350 (PEG 3350) POWD Please take this medication 1 to 3 times daily so that you have bowel movements everyday.  . pyridOXINE (VITAMIN B-6) 100 MG tablet Take 1 tablet (100 mg total) by mouth daily.  . sildenafil (REVATIO) 20 MG tablet 1-5 pills as  needed before sex 30 minutes to 4 hours  . zolpidem (AMBIEN) 10 MG tablet Take 1 tablet (10 mg total) by mouth at bedtime as needed for sleep.  . [DISCONTINUED] ondansetron (ZOFRAN) 4 MG tablet Take 1 tablet (4 mg total) by mouth every 8 (eight) hours as needed for nausea or  vomiting.   No Known Allergies Recent Results (from the past 2160 hour(s))  Lipase, blood     Status: None   Collection Time: 04/18/18  1:32 PM  Result Value Ref Range   Lipase 20 11 - 51 U/L    Comment: Performed at [facility], [address] [place], [place] [address]  Comprehensive metabolic panel     Status: Abnormal   Collection Time: 04/18/18  1:32 PM  Result Value Ref Range   Sodium 137 135 - 145 mmol/L   Potassium 3.2 (L) 3.5 - 5.1 mmol/L   Chloride 104 98 - 111 mmol/L   CO2 23 22 - 32 mmol/L   Glucose, Bld 86 70 - 99 mg/dL   BUN 18 8 - 23 mg/dL   Creatinine, Ser 0.860.72 0.61 - 1.24 mg/dL   Calcium 9.0 8.9 - 57.810.3 mg/dL   Total Protein 7.6 6.5 - 8.1 g/dL   Albumin 4.7 3.5 - 5.0 g/dL   AST 27 15 - 41 U/L   ALT 17 0 - 44 U/L   Alkaline Phosphatase 51 38 - 126 U/L   Total Bilirubin 0.5 0.3 - 1.2 mg/dL   GFR calc non Af Amer >60 >60 mL/min   GFR calc Af Amer >60 >60 mL/min   Anion gap 10 5 - 15    Comment: Performed at [facility], [address] [place], [place] [address]  CBC     Status: Abnormal   Collection Time: 04/18/18  1:32 PM  Result Value Ref Range   WBC 16.4 (H) 4.0 - 10.5 K/uL   RBC 4.75 4.22 - 5.81 MIL/uL   Hemoglobin 14.2 13.0 - 17.0 g/dL   HCT 95.244.1 84.139.0 - 32.452.0 %   MCV 92.8 80.0 - 100.0 fL   MCH 29.9 26.0 - 34.0 pg   MCHC 32.2 30.0 - 36.0 g/dL   RDW 40.115.5 02.711.5 - 25.315.5 %   Platelets 235 150 - 400 K/uL   nRBC 0.0 0.0 - 0.2 %    Comment: Performed at [facility], [address] [place], [place] [address]  Urinalysis, Complete w Microscopic     Status: Abnormal   Collection Time: 04/18/18  1:32 PM  Result Value Ref Range   Color, Urine YELLOW (A) YELLOW    APPearance CLEAR (A) CLEAR   Specific Gravity, Urine 1.018 1.005 - 1.030   pH 5.0 5.0 - 8.0   Glucose, UA NEGATIVE NEGATIVE mg/dL   Hgb urine dipstick SMALL (A) NEGATIVE   Bilirubin Urine NEGATIVE NEGATIVE   Ketones, ur NEGATIVE NEGATIVE mg/dL   Protein, ur NEGATIVE NEGATIVE mg/dL   Nitrite NEGATIVE NEGATIVE   Leukocytes, UA NEGATIVE NEGATIVE   RBC / HPF 0-5 0 - 5 RBC/hpf   WBC, UA 0-5 0 - 5 WBC/hpf   Bacteria, UA NONE SEEN NONE SEEN   Squamous Epithelial / LPF 0-5 0 - 5   Mucus PRESENT     Comment: Performed at [facility], [address] [place], [place] [address]  Troponin I - ONCE - STAT     Status: None   Collection Time: 04/18/18  1:32 PM  Result Value Ref Range   Troponin I <0.03 <0.03 ng/mL    Comment: Performed at [facility], [address] [place], [place] [address]  Influenza panel by PCR (type A & B)     Status: None   Collection Time: 04/18/18  1:32 PM  Result Value Ref Range   Influenza A  By PCR NEGATIVE NEGATIVE   Influenza B By PCR NEGATIVE NEGATIVE    Comment: (NOTE) The Xpert Xpress Flu assay is intended as an aid in the diagnosis of  influenza and should not be used as a sole basis for treatment.  This  assay is FDA approved for nasopharyngeal swab specimens only. Nasal  washings and aspirates are unacceptable for Xpert Xpress Flu testing. Performed at [facility], [address] [place], [place] [address]   Blood Culture (routine x 2)     Status: None   Collection Time: 04/18/18  4:35 PM  Result Value Ref Range   Specimen Description BLOOD BLOOD RIGHT FOREARM    Special Requests      BOTTLES DRAWN AEROBIC AND ANAEROBIC Blood Culture results may not be optimal due to an inadequate volume of blood received in culture bottles   Culture      NO GROWTH 5 DAYS Performed at [facility], [address], [place], [place] [address]    Report Status 04/23/2018 FINAL   CG4 I-STAT (Lactic acid)     Status: None    Collection Time: 04/18/18  4:44 PM  Result Value Ref Range   Lactic Acid, Venous 1.38 0.5 - 1.9 mmol/L  Blood Culture (routine x 2)     Status: None   Collection Time: 04/18/18  4:45 PM  Result Value Ref Range   Specimen Description BLOOD LEFT ANTECUBITAL    Special Requests      BOTTLES DRAWN AEROBIC AND ANAEROBIC Blood Culture adequate volume   Culture      NO GROWTH 5 DAYS Performed at [facility], [address] [place], [place] [address]    Report Status 04/23/2018 FINAL   Urine Culture     Status: None   Collection Time: 04/18/18  5:58 PM  Result Value Ref Range   Specimen Description URINE, RANDOM    Special Requests      NONE Performed at [facility], [address] [place], [place] [address]    Culture NO GROWTH    Report Status 04/20/2018 FINAL   Procalcitonin - Baseline     Status: None   Collection Time: 04/18/18 10:46 PM  Result Value Ref Range   Procalcitonin 15.42 ng/mL    Comment:        Interpretation: PCT >= 10 ng/mL: Important systemic inflammatory response, almost exclusively due to severe bacterial sepsis or septic shock. (NOTE)       Sepsis PCT Algorithm           Lower Respiratory Tract                                      Infection PCT Algorithm    ----------------------------     ----------------------------         PCT < 0.25 ng/mL                PCT < 0.10 ng/mL         Strongly encourage             Strongly discourage   discontinuation of antibiotics    initiation of antibiotics    ----------------------------     -----------------------------       PCT 0.25 - 0.50 ng/mL            PCT 0.10 - 0.25 ng/mL  OR       >80% decrease in PCT            Discourage initiation of                                            antibiotics      Encourage discontinuation           of antibiotics    ----------------------------     -----------------------------         PCT >= 0.50 ng/mL              PCT 0.26 - 0.50  ng/mL                AND       <80% decrease in PCT             Encourage initiation of                                             antibiotics       Encourage continuation           of antibiotics    ----------------------------     -----------------------------        PCT >= 0.50 ng/mL                  PCT > 0.50 ng/mL               AND         increase in PCT                  Strongly encourage                                      initiation of antibiotics    Strongly encourage escalation           of antibiotics                                     -----------------------------                                           PCT <= 0.25 ng/mL                                                 OR                                        > 80% decrease in PCT                                     Discontinue / Do not initiate  antibiotics Performed at [facility], [address] [place], [place] [address]   Basic metabolic panel     Status: Abnormal   Collection Time: 04/19/18  3:21 AM  Result Value Ref Range   Sodium 139 135 - 145 mmol/L   Potassium 3.6 3.5 - 5.1 mmol/L   Chloride 106 98 - 111 mmol/L   CO2 26 22 - 32 mmol/L   Glucose, Bld 101 (H) 70 - 99 mg/dL   BUN 18 8 - 23 mg/dL   Creatinine, Ser 6.210.94 0.61 - 1.24 mg/dL   Calcium 8.3 (L) 8.9 - 10.3 mg/dL   GFR calc non Af Amer >60 >60 mL/min   GFR calc Af Amer >60 >60 mL/min   Anion gap 7 5 - 15    Comment: Performed at [facility], [address] [place], [place] [address]  CBC     Status: Abnormal   Collection Time: 04/19/18  3:21 AM  Result Value Ref Range   WBC 23.5 (H) 4.0 - 10.5 K/uL   RBC 4.45 4.22 - 5.81 MIL/uL   Hemoglobin 13.1 13.0 - 17.0 g/dL   HCT 78.440.9 69.639.0 - 29.552.0 %   MCV 91.9 80.0 - 100.0 fL   MCH 29.4 26.0 - 34.0 pg   MCHC 32.0 30.0 - 36.0 g/dL   RDW 28.415.8 (H) 13.211.5 - 44.015.5 %   Platelets 218 150 - 400 K/uL   nRBC 0.0 0.0 - 0.2 %    Comment:  Performed at [facility], [address] [place], [place] [address]  Procalcitonin     Status: None   Collection Time: 04/19/18  3:21 AM  Result Value Ref Range   Procalcitonin 13.56 ng/mL    Comment:        Interpretation: PCT >= 10 ng/mL: Important systemic inflammatory response, almost exclusively due to severe bacterial sepsis or septic shock. (NOTE)       Sepsis PCT Algorithm           Lower Respiratory Tract                                      Infection PCT Algorithm    ----------------------------     ----------------------------         PCT < 0.25 ng/mL                PCT < 0.10 ng/mL         Strongly encourage             Strongly discourage   discontinuation of antibiotics    initiation of antibiotics    ----------------------------     -----------------------------       PCT 0.25 - 0.50 ng/mL            PCT 0.10 - 0.25 ng/mL               OR       >80% decrease in PCT            Discourage initiation of                                            antibiotics      Encourage discontinuation           of antibiotics    ----------------------------     -----------------------------  PCT >= 0.50 ng/mL              PCT 0.26 - 0.50 ng/mL                AND       <80% decrease in PCT             Encourage initiation of                                             antibiotics       Encourage continuation           of antibiotics    ----------------------------     -----------------------------        PCT >= 0.50 ng/mL                  PCT > 0.50 ng/mL               AND         increase in PCT                  Strongly encourage                                      initiation of antibiotics    Strongly encourage escalation           of antibiotics                                     -----------------------------                                           PCT <= 0.25 ng/mL                                                 OR                                         > 80% decrease in PCT                                     Discontinue / Do not initiate                                             antibiotics Performed at [facility], [address] [place], [place] [address]   Creatinine, serum     Status: None   Collection Time: 04/20/18  3:37 AM  Result Value Ref Range   Creatinine, Ser 0.83 0.61 - 1.24 mg/dL   GFR calc non Af Amer >60 >60 mL/min   GFR calc Af Amer >60 >60 mL/min  Comment: Performed at [facility], [address] [place], [place] [address]  CBC     Status: Abnormal   Collection Time: 04/20/18  3:37 AM  Result Value Ref Range   WBC 15.9 (H) 4.0 - 10.5 K/uL   RBC 4.28 4.22 - 5.81 MIL/uL   Hemoglobin 12.5 (L) 13.0 - 17.0 g/dL   HCT 60.4 54.0 - 98.1 %   MCV 92.8 80.0 - 100.0 fL   MCH 29.2 26.0 - 34.0 pg   MCHC 31.5 30.0 - 36.0 g/dL   RDW 19.1 (H) 47.8 - 29.5 %   Platelets 208 150 - 400 K/uL   nRBC 0.0 0.0 - 0.2 %    Comment: Performed at [facility], [address] [place], [place] [address]  Troponin I - ONCE - STAT     Status: Abnormal   Collection Time: 04/20/18  8:49 AM  Result Value Ref Range   Troponin I 0.03 (HH) <0.03 ng/mL    Comment: CRITICAL RESULT CALLED TO, READ BACK BY AND VERIFIED WITH LIZ HAYS 04/20/18 0933 KBH Performed at [facility], [address] [place], [place] [address]   Troponin I - Now Then Q6H     Status: Abnormal   Collection Time: 04/20/18  2:48 PM  Result Value Ref Range   Troponin I 0.04 (HH) <0.03 ng/mL    Comment: CRITICAL VALUE NOTED. VALUE IS CONSISTENT WITH PREVIOUSLY REPORTED/CALLED VALUE.MSS Performed at [facility], [address] [place], [place] [address]   Troponin I - Now Then Q6H     Status: Abnormal   Collection Time: 04/20/18  8:43 PM  Result Value Ref Range   Troponin I 0.03 (HH) <0.03 ng/mL    Comment: CRITICAL VALUE NOTED. VALUE IS CONSISTENT WITH PREVIOUSLY REPORTED/CALLED VALUE. QSD Performed at  [facility], [address] [place], [place] [address]   Troponin I - Now Then Q6H     Status: None   Collection Time: 04/21/18  2:29 AM  Result Value Ref Range   Troponin I <0.03 <0.03 ng/mL    Comment: Performed at [facility], [address] [place], [place] [address]  CBC     Status: Abnormal   Collection Time: 04/21/18  8:54 AM  Result Value Ref Range   WBC 6.2 4.0 - 10.5 K/uL   RBC 4.03 (L) 4.22 - 5.81 MIL/uL   Hemoglobin 11.9 (L) 13.0 - 17.0 g/dL   HCT 01.0 (L) 27.2 - 53.6 %   MCV 93.8 80.0 - 100.0 fL   MCH 29.5 26.0 - 34.0 pg   MCHC 31.5 30.0 - 36.0 g/dL   RDW 64.4 (H) 03.4 - 74.2 %   Platelets 202 150 - 400 K/uL   nRBC 0.0 0.0 - 0.2 %    Comment: Performed at [facility], [address] [place], [place] [address]  C-reactive protein     Status: Abnormal   Collection Time: 04/22/18  3:20 AM  Result Value Ref Range   CRP 3.3 (H) <1.0 mg/dL    Comment: Performed at [facility], [address] [place], [place] [address]  CBC with Differential/Platelet     Status: Abnormal   Collection Time: 04/22/18  3:20 AM  Result Value Ref Range   WBC 5.3 4.0 - 10.5 K/uL   RBC 4.17 (L) 4.22 - 5.81 MIL/uL   Hemoglobin 12.2 (L) 13.0 - 17.0 g/dL   HCT 33.2 (L) 95.1 - 88.4 %   MCV  91.4 80.0 - 100.0 fL   MCH 29.3 26.0 - 34.0 pg   MCHC 32.0 30.0 - 36.0 g/dL   RDW 35.4 65.6 - 81.2 %   Platelets 240 150 - 400 K/uL   nRBC 0.0 0.0 - 0.2 %   Neutrophils Relative % 48 %   Neutro Abs 2.5 1.7 - 7.7 K/uL   Lymphocytes Relative 37 %   Lymphs Abs 2.0 0.7 - 4.0 K/uL   Monocytes Relative 13 %   Monocytes Absolute 0.7 0.1 - 1.0 K/uL   Eosinophils Relative 2 %   Eosinophils Absolute 0.1 0.0 - 0.5 K/uL   Basophils Relative 0 %   Basophils Absolute 0.0 0.0 - 0.1 K/uL   Immature Granulocytes 0 %   Abs Immature Granulocytes 0.01 0.00 - 0.07 K/uL    Comment: Performed at [facility], [address] [place], [place] [address]  Comprehensive  metabolic panel     Status: Abnormal   Collection Time: 04/22/18  3:20 AM  Result Value Ref Range   Sodium 142 135 - 145 mmol/L   Potassium 3.7 3.5 - 5.1 mmol/L   Chloride 108 98 - 111 mmol/L   CO2 26 22 - 32 mmol/L   Glucose, Bld 90 70 - 99 mg/dL   BUN 13 8 - 23 mg/dL   Creatinine, Ser 0.17 0.61 - 1.24 mg/dL   Calcium 8.6 (L) 8.9 - 10.3 mg/dL   Total Protein 6.7 6.5 - 8.1 g/dL   Albumin 3.6 3.5 - 5.0 g/dL   AST 47 (H) 15 - 41 U/L   ALT 24 0 - 44 U/L   Alkaline Phosphatase 47 38 - 126 U/L   Total Bilirubin 0.6 0.3 - 1.2 mg/dL   GFR calc non Af Amer >60 >60 mL/min   GFR calc Af Amer >60 >60 mL/min   Anion gap 8 5 - 15    Comment: Performed at [facility], [address] [place], [place] [address]  ECHOCARDIOGRAM COMPLETE     Status: None   Collection Time: 04/22/18  8:27 AM  Result Value Ref Range   Weight 2,288 oz   Height 67 in   BP 127/86 mmHg  Vancomycin, peak     Status: Abnormal   Collection Time: 04/22/18  9:34 AM  Result Value Ref Range   Vancomycin Pk 23 (L) 30 - 40 ug/mL    Comment: Performed at [facility], [address] [place], [place] [address]  Vancomycin, trough     Status: Abnormal   Collection Time: 04/23/18  5:35 AM  Result Value Ref Range   Vancomycin Tr 5 (L) 15 - 20 ug/mL    Comment: Performed at [facility], [address] [place], [place] [address]  Procalcitonin - Baseline     Status: None   Collection Time: 04/23/18  5:35 AM  Result Value Ref Range   Procalcitonin 1.28 ng/mL    Comment:        Interpretation: PCT > 0.5 ng/mL and <= 2 ng/mL: Systemic infection (sepsis) is possible, but other conditions are known to elevate PCT as well. (NOTE)       Sepsis PCT Algorithm           Lower Respiratory Tract                                      Infection PCT Algorithm    ----------------------------     ----------------------------  PCT < 0.25 ng/mL                PCT < 0.10 ng/mL         Strongly  encourage             Strongly discourage   discontinuation of antibiotics    initiation of antibiotics    ----------------------------     -----------------------------       PCT 0.25 - 0.50 ng/mL            PCT 0.10 - 0.25 ng/mL               OR       >80% decrease in PCT            Discourage initiation of                                            antibiotics      Encourage discontinuation           of antibiotics    ----------------------------     -----------------------------         PCT >= 0.50 ng/mL              PCT 0.26 - 0.50 ng/mL                AND       <80% decrease in PCT             Encourage initiation of                                             antibiotics       Encourage continuation           of antibiotics    ----------------------------     -----------------------------        PCT >= 0.50 ng/mL                  PCT > 0.50 ng/mL               AND         increase in PCT                  Strongly encourage                                      initiation of antibiotics    Strongly encourage escalation           of antibiotics                                     -----------------------------                                           PCT <= 0.25 ng/mL  OR                                        > 80% decrease in PCT                                     Discontinue / Do not initiate                                             antibiotics Performed at [facility], [address] [place], [place] [address]   Comprehensive metabolic panel     Status: None   Collection Time: 04/30/18 10:48 AM  Result Value Ref Range   Sodium 140 135 - 145 mEq/L   Potassium 4.1 3.5 - 5.1 mEq/L   Chloride 104 96 - 112 mEq/L   CO2 29 19 - 32 mEq/L   Glucose, Bld 91 70 - 99 mg/dL   BUN 20 6 - 23 mg/dL   Creatinine, Ser 6.04 0.40 - 1.50 mg/dL   Total Bilirubin 0.3 0.2 - 1.2 mg/dL   Alkaline Phosphatase 53 39 - 117 U/L    AST 25 0 - 37 U/L   ALT 17 0 - 53 U/L   Total Protein 7.7 6.0 - 8.3 g/dL   Albumin 4.4 3.5 - 5.2 g/dL   Calcium 9.5 8.4 - 54.0 mg/dL   GFR 981.19 >14.78 mL/min  CBC with Differential/Platelet     Status: Abnormal   Collection Time: 04/30/18 10:48 AM  Result Value Ref Range   WBC 6.7 4.0 - 10.5 K/uL   RBC 4.68 4.22 - 5.81 Mil/uL   Hemoglobin 14.0 13.0 - 17.0 g/dL   HCT 29.5 62.1 - 30.8 %   MCV 91.5 78.0 - 100.0 fl   MCHC 32.8 30.0 - 36.0 g/dL   RDW 65.7 (H) 84.6 - 96.2 %   Platelets 395.0 150.0 - 400.0 K/uL   Neutrophils Relative % 53.6 43.0 - 77.0 %   Lymphocytes Relative 34.4 12.0 - 46.0 %   Monocytes Relative 10.4 3.0 - 12.0 %   Eosinophils Relative 1.1 0.0 - 5.0 %   Basophils Relative 0.5 0.0 - 3.0 %   Neutro Abs 3.6 1.4 - 7.7 K/uL   Lymphs Abs 2.3 0.7 - 4.0 K/uL   Monocytes Absolute 0.7 0.1 - 1.0 K/uL   Eosinophils Absolute 0.1 0.0 - 0.7 K/uL   Basophils Absolute 0.0 0.0 - 0.1 K/uL  Magnesium     Status: None   Collection Time: 04/30/18 10:48 AM  Result Value Ref Range   Magnesium 1.8 1.5 - 2.5 mg/dL   Objective  Body mass index is 21.05 kg/m. Wt Readings from Last 3 Encounters:  05/30/18 134 lb 6.4 oz (61 kg)  04/30/18 136 lb (61.7 kg)  04/18/18 143 lb (64.9 kg)   Temp Readings from Last 3 Encounters:  05/30/18 98.2 F (36.8 C) (Oral)  04/30/18 97.6 F (36.4 C) (Oral)  04/23/18 98.3 F (36.8 C) (Oral)   BP Readings from Last 3 Encounters:  05/30/18 134/72  04/30/18 120/88  04/23/18 114/85   Pulse Readings from Last 3 Encounters:  05/30/18 78  04/30/18 80  04/23/18 82    Physical Exam  Vitals signs and nursing note reviewed.  Constitutional:      Appearance: Normal appearance. He is well-developed and well-groomed.  HENT:     Head: Normocephalic and atraumatic.     Nose: Nose normal.     Mouth/Throat:     Mouth: Mucous membranes are moist.     Pharynx: Oropharynx is clear.  Eyes:     Conjunctiva/sclera: Conjunctivae normal.     Pupils: Pupils  are equal, round, and reactive to light.  Cardiovascular:     Rate and Rhythm: Normal rate and regular rhythm.     Heart sounds: Normal heart sounds.  Pulmonary:     Effort: Pulmonary effort is normal.     Breath sounds: Normal breath sounds.  Skin:    General: Skin is warm and dry.  Neurological:     General: No focal deficit present.     Mental Status: He is alert and oriented to person, place, and time. Mental status is at baseline.     Gait: Gait normal.  Psychiatric:        Attention and Perception: Attention and perception normal.        Mood and Affect: Mood and affect normal.        Speech: Speech normal.        Behavior: Behavior normal. Behavior is cooperative.        Thought Content: Thought content normal.        Cognition and Memory: Cognition and memory normal.        Judgment: Judgment normal.     Assessment   1. Chronic nausea  2. Right ear itching  3. Poor dentition c/w abscess  4. HM Plan   1. Prn zofran  2. Ear wax min b/l ears 3. Given info dental clinics to call [facility], [facility]  augmentin bid x 1 week  4.  Declines flu shot   Had2/3 hep B vaccines given another today last dose  Will disc Tdap and shingrix in future PSA had 02/27/18 1.67 normal DRE in future  -h/o hematuria CT 08/2017 with left kidney stone could be etiology   Colonoscopy 3 and 07/2017 had poor prep diverticulosis  -f/u GI 03/05/18 will ask if will order CT liver protocol for liver lesion  -of note will [facility] never had CT ab/pelvis sch 11 or 03/2018   Other labs CMET, CBC, lipid had 01/15/18 sl anemic 10.4/34.5    Will need to see eye MD in future   Provider: Dr. French Ana McLean-Scocuzza-Internal Medicine

## 2018-06-03 ENCOUNTER — Telehealth: Payer: Self-pay

## 2018-06-03 NOTE — Telephone Encounter (Signed)
-----   Message from Wyline Mood, MD sent at 06/02/2018  1:36 PM EST ----- Denton Meek please schedule follow up   Kiran  ----- Message ----- From: McLean-Scocuzza, Pasty Spillers, MD Sent: 05/30/2018   8:58 AM EST To: Wyline Mood, MD  Appears this pt was lost to f/u and never had repeat CT ab/pelvis was supposed to be done 11 or 03/2018 scheduled for 03/11/18   Thanks tMS

## 2018-06-03 NOTE — Telephone Encounter (Signed)
Called pt to offer a follow up appointment with Dr. Robbie Lis to contact LVM to return call

## 2018-06-19 DIAGNOSIS — G894 Chronic pain syndrome: Secondary | ICD-10-CM | POA: Diagnosis not present

## 2018-06-19 DIAGNOSIS — M47816 Spondylosis without myelopathy or radiculopathy, lumbar region: Secondary | ICD-10-CM | POA: Diagnosis not present

## 2018-06-19 DIAGNOSIS — M79609 Pain in unspecified limb: Secondary | ICD-10-CM | POA: Diagnosis not present

## 2018-07-04 DIAGNOSIS — G8929 Other chronic pain: Secondary | ICD-10-CM | POA: Diagnosis not present

## 2018-07-04 DIAGNOSIS — G629 Polyneuropathy, unspecified: Secondary | ICD-10-CM | POA: Diagnosis not present

## 2018-07-04 DIAGNOSIS — G4700 Insomnia, unspecified: Secondary | ICD-10-CM | POA: Diagnosis not present

## 2018-07-04 DIAGNOSIS — E785 Hyperlipidemia, unspecified: Secondary | ICD-10-CM | POA: Diagnosis not present

## 2018-07-04 DIAGNOSIS — I509 Heart failure, unspecified: Secondary | ICD-10-CM | POA: Diagnosis not present

## 2018-07-04 DIAGNOSIS — I110 Hypertensive heart disease with heart failure: Secondary | ICD-10-CM | POA: Diagnosis not present

## 2018-07-04 DIAGNOSIS — I739 Peripheral vascular disease, unspecified: Secondary | ICD-10-CM | POA: Diagnosis not present

## 2018-07-04 DIAGNOSIS — I25119 Atherosclerotic heart disease of native coronary artery with unspecified angina pectoris: Secondary | ICD-10-CM | POA: Diagnosis not present

## 2018-07-04 DIAGNOSIS — R69 Illness, unspecified: Secondary | ICD-10-CM | POA: Diagnosis not present

## 2018-07-04 DIAGNOSIS — Z008 Encounter for other general examination: Secondary | ICD-10-CM | POA: Diagnosis not present

## 2018-07-22 DIAGNOSIS — M47817 Spondylosis without myelopathy or radiculopathy, lumbosacral region: Secondary | ICD-10-CM | POA: Diagnosis not present

## 2018-07-22 DIAGNOSIS — G894 Chronic pain syndrome: Secondary | ICD-10-CM | POA: Diagnosis not present

## 2018-07-22 DIAGNOSIS — M5136 Other intervertebral disc degeneration, lumbar region: Secondary | ICD-10-CM | POA: Diagnosis not present

## 2018-07-22 DIAGNOSIS — Z79899 Other long term (current) drug therapy: Secondary | ICD-10-CM | POA: Diagnosis not present

## 2018-07-22 DIAGNOSIS — Z79891 Long term (current) use of opiate analgesic: Secondary | ICD-10-CM | POA: Diagnosis not present

## 2018-07-22 DIAGNOSIS — M25551 Pain in right hip: Secondary | ICD-10-CM | POA: Diagnosis not present

## 2018-07-22 DIAGNOSIS — M47816 Spondylosis without myelopathy or radiculopathy, lumbar region: Secondary | ICD-10-CM | POA: Diagnosis not present

## 2018-07-22 DIAGNOSIS — M79606 Pain in leg, unspecified: Secondary | ICD-10-CM | POA: Diagnosis not present

## 2018-07-23 ENCOUNTER — Other Ambulatory Visit: Payer: Self-pay | Admitting: Internal Medicine

## 2018-07-23 DIAGNOSIS — G8929 Other chronic pain: Secondary | ICD-10-CM

## 2018-07-23 DIAGNOSIS — M62838 Other muscle spasm: Secondary | ICD-10-CM

## 2018-07-23 DIAGNOSIS — M545 Low back pain, unspecified: Secondary | ICD-10-CM

## 2018-07-23 DIAGNOSIS — E531 Pyridoxine deficiency: Secondary | ICD-10-CM

## 2018-07-23 MED ORDER — VITAMIN B-6 100 MG PO TABS: 100.0000 mg | ORAL_TABLET | Freq: Every day | ORAL | 3 refills | Status: DC

## 2018-07-23 MED ORDER — BACLOFEN 5 MG PO TABS: 5.0000 mg | ORAL_TABLET | Freq: Two times a day (BID) | ORAL | 12 refills | Status: DC

## 2018-08-19 DIAGNOSIS — G894 Chronic pain syndrome: Secondary | ICD-10-CM | POA: Diagnosis not present

## 2018-08-19 DIAGNOSIS — M47816 Spondylosis without myelopathy or radiculopathy, lumbar region: Secondary | ICD-10-CM | POA: Diagnosis not present

## 2018-08-19 DIAGNOSIS — M25551 Pain in right hip: Secondary | ICD-10-CM | POA: Diagnosis not present

## 2018-08-19 DIAGNOSIS — M79606 Pain in leg, unspecified: Secondary | ICD-10-CM | POA: Diagnosis not present

## 2018-09-17 DIAGNOSIS — M25551 Pain in right hip: Secondary | ICD-10-CM | POA: Diagnosis not present

## 2018-09-17 DIAGNOSIS — M47816 Spondylosis without myelopathy or radiculopathy, lumbar region: Secondary | ICD-10-CM | POA: Diagnosis not present

## 2018-09-17 DIAGNOSIS — G894 Chronic pain syndrome: Secondary | ICD-10-CM | POA: Diagnosis not present

## 2018-09-17 DIAGNOSIS — Z79899 Other long term (current) drug therapy: Secondary | ICD-10-CM | POA: Diagnosis not present

## 2018-09-17 DIAGNOSIS — M79606 Pain in leg, unspecified: Secondary | ICD-10-CM | POA: Diagnosis not present

## 2018-09-17 DIAGNOSIS — Z79891 Long term (current) use of opiate analgesic: Secondary | ICD-10-CM | POA: Diagnosis not present

## 2018-09-20 ENCOUNTER — Other Ambulatory Visit: Payer: Self-pay | Admitting: Internal Medicine

## 2018-09-20 DIAGNOSIS — Z95810 Presence of automatic (implantable) cardiac defibrillator: Secondary | ICD-10-CM | POA: Diagnosis not present

## 2018-09-20 DIAGNOSIS — Z4502 Encounter for adjustment and management of automatic implantable cardiac defibrillator: Secondary | ICD-10-CM | POA: Diagnosis not present

## 2018-09-20 DIAGNOSIS — I472 Ventricular tachycardia: Secondary | ICD-10-CM | POA: Diagnosis not present

## 2018-09-20 DIAGNOSIS — R69 Illness, unspecified: Secondary | ICD-10-CM | POA: Diagnosis not present

## 2018-09-20 DIAGNOSIS — R110 Nausea: Secondary | ICD-10-CM

## 2018-09-20 MED ORDER — ONDANSETRON HCL 4 MG PO TABS: 4.0000 mg | ORAL_TABLET | Freq: Two times a day (BID) | ORAL | 2 refills | Status: DC | PRN

## 2018-10-04 ENCOUNTER — Ambulatory Visit (INDEPENDENT_AMBULATORY_CARE_PROVIDER_SITE_OTHER): Payer: Medicare HMO | Admitting: Internal Medicine

## 2018-10-04 ENCOUNTER — Encounter: Payer: Self-pay | Admitting: Internal Medicine

## 2018-10-04 ENCOUNTER — Other Ambulatory Visit: Payer: Self-pay

## 2018-10-04 DIAGNOSIS — R079 Chest pain, unspecified: Secondary | ICD-10-CM | POA: Diagnosis not present

## 2018-10-04 DIAGNOSIS — G4700 Insomnia, unspecified: Secondary | ICD-10-CM

## 2018-10-04 DIAGNOSIS — R7303 Prediabetes: Secondary | ICD-10-CM

## 2018-10-04 DIAGNOSIS — Z1159 Encounter for screening for other viral diseases: Secondary | ICD-10-CM | POA: Diagnosis not present

## 2018-10-04 DIAGNOSIS — G8929 Other chronic pain: Secondary | ICD-10-CM | POA: Diagnosis not present

## 2018-10-04 DIAGNOSIS — E559 Vitamin D deficiency, unspecified: Secondary | ICD-10-CM

## 2018-10-04 DIAGNOSIS — I10 Essential (primary) hypertension: Secondary | ICD-10-CM | POA: Diagnosis not present

## 2018-10-04 DIAGNOSIS — I2510 Atherosclerotic heart disease of native coronary artery without angina pectoris: Secondary | ICD-10-CM | POA: Diagnosis not present

## 2018-10-04 DIAGNOSIS — Z125 Encounter for screening for malignant neoplasm of prostate: Secondary | ICD-10-CM

## 2018-10-04 DIAGNOSIS — Z1329 Encounter for screening for other suspected endocrine disorder: Secondary | ICD-10-CM | POA: Diagnosis not present

## 2018-10-04 DIAGNOSIS — Z0184 Encounter for antibody response examination: Secondary | ICD-10-CM

## 2018-10-04 MED ORDER — GABAPENTIN 300 MG PO CAPS: 300.0000 mg | ORAL_CAPSULE | Freq: Two times a day (BID) | ORAL | 0 refills | Status: DC

## 2018-10-04 MED ORDER — ZOLPIDEM TARTRATE 10 MG PO TABS: 10.0000 mg | ORAL_TABLET | Freq: Every evening | ORAL | 5 refills | Status: DC | PRN

## 2018-10-04 MED ORDER — NITROGLYCERIN 0.4 MG SL SUBL: 0.4000 mg | SUBLINGUAL_TABLET | SUBLINGUAL | 3 refills | Status: AC | PRN

## 2018-10-04 NOTE — Progress Notes (Signed)
Virtual Visit via Video Note  I connected with Martin MooreLindsay F Hernandez  on 10/04/18 at  8:15 AM EDT by a video enabled telemedicine application and verified that I am speaking with the correct person using two identifiers.  Location patient: home Location provider:work  Persons participating in the virtual visit: patient, provider, pts wife and son  I discussed the limitations of evaluation and management by telemedicine and the availability of in person appointments. The patient expressed understanding and agreed to proceed.   HPI: 1. CAD s/p BMS with AICD cards f/u 09/20/18 with 4 runs SVT due to f/u in 1 year no medication changes. Pt denies chest pain per wife cardiology rec no further colonoscopies due to his heart. He is taking imdur 1 whole tablet in am and 1/2 mg bid  2. Right ear still itching and feels clogged and like bumps in it he uses qtip with alcohol to help and currently does not want to see ENT  3. 4 bottom teeth pulled 3-4 weeks ago due to infection  4. Chronic pain doing well he had an injection 2 months ago with the pain clinic     ROS: See pertinent positives and negatives per HPI. General: wt as of 09/20/18 124 lbs per pt different scales different but he reports his pants feel tight GI: denies GIB, constipation not taking Linzess 145, no dysphagia, normal appetite   Past Medical History:  Diagnosis Date  . Allergy   . CAD (coronary artery disease)   . Depression   . Frequent headaches   . GERD (gastroesophageal reflux disease)   . Headache   . Hyperlipidemia   . Hypertension   . PAD (peripheral artery disease) (HCC)   . Tobacco abuse     Past Surgical History:  Procedure Laterality Date  . COLONOSCOPY WITH PROPOFOL N/A 06/20/2017   Procedure: COLONOSCOPY WITH PROPOFOL;  Surgeon: Wyline MoodAnna, Kiran, MD;  [facility] ENDOSCOPY;  Service: Gastroenterology;  Laterality: N/A;  . COLONOSCOPY WITH PROPOFOL N/A 07/24/2017   Procedure: COLONOSCOPY WITH PROPOFOL;   Surgeon: Wyline MoodAnna, Kiran, MD;  [facility];  Service: Gastroenterology;  Laterality: N/A;  . ESOPHAGOGASTRODUODENOSCOPY (EGD) WITH PROPOFOL N/A 06/20/2017   Procedure: ESOPHAGOGASTRODUODENOSCOPY (EGD) WITH PROPOFOL;  Surgeon: Wyline MoodAnna, Kiran, MD;  [facility];  Service: Gastroenterology;  Laterality: N/A;  . PERCUTANEOUS CORONARY STENT INTERVENTION (PCI-S)    . STOMACH SURGERY     2009 [facility] per chart review pt had AAA repair   . TONSILLECTOMY      Family History  Problem Relation Age of Onset  . Diabetes Mother   . Heart disease Mother   . Hypertension Mother   . Stroke Mother   . Prostate cancer Father   . Cancer Father        prostate cancer in 7650s  . Diabetes Sister   . Heart disease Sister   . Ulcers Other        unknown who had ulcers in family     SOCIAL HX: lives with wife at home   Current Outpatient Medications:  .  amLODipine (NORVASC) 2.5 MG tablet, Take 2.5 mg by mouth daily. , Disp: , Rfl:  .  aspirin 81 MG tablet, Take 81 mg by mouth daily., Disp: , Rfl:  .  atorvastatin (LIPITOR) 80 MG tablet, Take 1 tablet (80 mg total) by mouth daily at 6 PM., Disp: 90 tablet, Rfl: 3 .  Cholecalciferol 1.25 MG (50000 UT) capsule, Take 1 capsule (50,000 Units total) by mouth once  a week., Disp: 13 capsule, Rfl: 1 .  clopidogrel (PLAVIX) 75 MG tablet, 1 pill daily, Disp: 90 tablet, Rfl: 3 .  fluticasone (FLONASE) 50 MCG/ACT nasal spray, Place 1-2 sprays into both nostrils daily., Disp: 16 g, Rfl: 11 .  gabapentin (NEURONTIN) 300 MG capsule, Take 1 capsule (300 mg total) by mouth 2 (two) times daily., Disp: 30 capsule, Rfl: 0 .  ipratropium (ATROVENT) 0.02 % nebulizer solution, Inhale into the lungs., Disp: , Rfl:  .  isosorbide mononitrate (IMDUR) 30 MG 24 hr tablet, Take 60 mg by mouth 2 (two) times daily. , Disp: , Rfl:  .  lisinopril (PRINIVIL,ZESTRIL) 5 MG tablet, 1 pill daily, Disp: 90 tablet, Rfl: 3 .  metoprolol succinate (TOPROL-XL) 25 MG 24 hr tablet,  Take 1 tablet (25 mg total) by mouth daily., Disp: 90 tablet, Rfl: 3 .  mirtazapine (REMERON) 30 MG tablet, 1 pill nightly, Disp: 90 tablet, Rfl: 3 .  nitroGLYCERIN (NITROSTAT) 0.4 MG SL tablet, Place 1 tablet (0.4 mg total) under the tongue every 5 (five) minutes as needed for chest pain., Disp: 90 tablet, Rfl: 3 .  ondansetron (ZOFRAN) 4 MG tablet, Take 1 tablet (4 mg total) by mouth 2 (two) times daily as needed for nausea or vomiting., Disp: 60 tablet, Rfl: 2 .  oxyCODONE-acetaminophen (PERCOCET) 10-325 MG tablet, , Disp: , Rfl:  .  pantoprazole (PROTONIX) 40 MG tablet, Take 1 tablet (40 mg total) by mouth daily. 30 minutes before food, Disp: 30 tablet, Rfl: 0 .  pyridOXINE (VITAMIN B-6) 100 MG tablet, Take 1 tablet (100 mg total) by mouth daily., Disp: 90 tablet, Rfl: 3 .  sildenafil (REVATIO) 20 MG tablet, 1-5 pills as needed before sex 30 minutes to 4 hours, Disp: 30 tablet, Rfl: 11 .  zolpidem (AMBIEN) 10 MG tablet, Take 1 tablet (10 mg total) by mouth at bedtime as needed for sleep., Disp: 30 tablet, Rfl: 5  EXAM:  VITALS per patient if applicable:  GENERAL: alert, oriented, appears well and in no acute distress  HEENT: atraumatic, conjunttiva clear, no obvious abnormalities on inspection of external nose and ears  NECK: normal movements of the head and neck  LUNGS: on inspection no signs of respiratory distress, breathing rate appears normal, no obvious gross SOB, gasping or wheezing  CV: no obvious cyanosis  MS: moves all visible extremities without noticeable abnormality  PSYCH/NEURO: pleasant and cooperative, no obvious depression or anxiety, speech and thought processing grossly intact  ASSESSMENT AND PLAN:  Discussed the following assessment and plan:  Essential hypertension - Fasting labs 03/01/19  Cont meds   Other chronic pain - Plan: gabapentin (NEURONTIN) 300 MG capsule bid f/u with pain clinic   Chest pain with h/o CAD s/p BMS, unspecified type - Plan:  nitroGLYCERIN (NITROSTAT) 0.4 MG SL tablet -f/u with cards 09/2019 sooner if needed   Insomnia, unspecified type - Plan: zolpidem (AMBIEN) 10 MG tablet  Prediabetes - Plan: Hemoglobin A1c  HM  Declines flu shot Had3/3 doses hep B check titer  Will disc Tdap and shingrix in future PSA had 02/27/18 1.67 normal DRE in future check PSA upcoming Hep C negative  -h/o hematuria CT 08/2017 with left kidney stone could be etiology HIV neg 03/29/15  Colonoscopy 3 and 07/2017 hadpoor prep diverticulosis  -f/u GI 03/05/18 will ask if will order CT liver protocol for liver lesion  -of note will [facility] never had CT ab/pelvis sch 11 or 03/2018  -per pts wife cardiology does not want  further procedures for now and especially without their approval  sch fasting labs 02/28/19   Will need to see eye MD in future   I discussed the assessment and treatment plan with the patient. The patient was provided an opportunity to ask questions and all were answered. The patient agreed with the plan and demonstrated an understanding of the instructions.   The patient was advised to call back or seek an in-person evaluation if the symptoms worsen or if the condition fails to improve as anticipated.  Time spent 15 minutes  Bevelyn Bucklesracy N McLean-Scocuzza, MD

## 2018-10-15 DIAGNOSIS — M47816 Spondylosis without myelopathy or radiculopathy, lumbar region: Secondary | ICD-10-CM | POA: Diagnosis not present

## 2018-10-15 DIAGNOSIS — M79606 Pain in leg, unspecified: Secondary | ICD-10-CM | POA: Diagnosis not present

## 2018-10-15 DIAGNOSIS — M25551 Pain in right hip: Secondary | ICD-10-CM | POA: Diagnosis not present

## 2018-10-15 DIAGNOSIS — G894 Chronic pain syndrome: Secondary | ICD-10-CM | POA: Diagnosis not present

## 2018-11-14 DIAGNOSIS — M79604 Pain in right leg: Secondary | ICD-10-CM | POA: Diagnosis not present

## 2018-11-14 DIAGNOSIS — M47817 Spondylosis without myelopathy or radiculopathy, lumbosacral region: Secondary | ICD-10-CM | POA: Diagnosis not present

## 2018-11-14 DIAGNOSIS — M5136 Other intervertebral disc degeneration, lumbar region: Secondary | ICD-10-CM | POA: Diagnosis not present

## 2018-11-28 DIAGNOSIS — M79604 Pain in right leg: Secondary | ICD-10-CM | POA: Diagnosis not present

## 2018-11-28 DIAGNOSIS — I5022 Chronic systolic (congestive) heart failure: Secondary | ICD-10-CM | POA: Diagnosis not present

## 2018-11-28 DIAGNOSIS — I255 Ischemic cardiomyopathy: Secondary | ICD-10-CM | POA: Diagnosis not present

## 2018-11-28 DIAGNOSIS — I2510 Atherosclerotic heart disease of native coronary artery without angina pectoris: Secondary | ICD-10-CM | POA: Diagnosis not present

## 2018-11-28 DIAGNOSIS — M79605 Pain in left leg: Secondary | ICD-10-CM | POA: Diagnosis not present

## 2018-12-12 DIAGNOSIS — Z79891 Long term (current) use of opiate analgesic: Secondary | ICD-10-CM | POA: Diagnosis not present

## 2018-12-12 DIAGNOSIS — G894 Chronic pain syndrome: Secondary | ICD-10-CM | POA: Diagnosis not present

## 2018-12-12 DIAGNOSIS — Z79899 Other long term (current) drug therapy: Secondary | ICD-10-CM | POA: Diagnosis not present

## 2018-12-12 DIAGNOSIS — M79606 Pain in leg, unspecified: Secondary | ICD-10-CM | POA: Diagnosis not present

## 2018-12-12 DIAGNOSIS — M25551 Pain in right hip: Secondary | ICD-10-CM | POA: Diagnosis not present

## 2018-12-12 DIAGNOSIS — M47816 Spondylosis without myelopathy or radiculopathy, lumbar region: Secondary | ICD-10-CM | POA: Diagnosis not present

## 2018-12-23 DIAGNOSIS — I2510 Atherosclerotic heart disease of native coronary artery without angina pectoris: Secondary | ICD-10-CM | POA: Diagnosis not present

## 2018-12-23 DIAGNOSIS — Z95810 Presence of automatic (implantable) cardiac defibrillator: Secondary | ICD-10-CM | POA: Diagnosis not present

## 2019-01-02 DIAGNOSIS — I255 Ischemic cardiomyopathy: Secondary | ICD-10-CM | POA: Diagnosis not present

## 2019-01-02 DIAGNOSIS — Z4502 Encounter for adjustment and management of automatic implantable cardiac defibrillator: Secondary | ICD-10-CM | POA: Diagnosis not present

## 2019-01-02 DIAGNOSIS — Z95810 Presence of automatic (implantable) cardiac defibrillator: Secondary | ICD-10-CM | POA: Diagnosis not present

## 2019-01-07 ENCOUNTER — Other Ambulatory Visit: Payer: Self-pay

## 2019-01-09 ENCOUNTER — Other Ambulatory Visit: Payer: Self-pay

## 2019-01-09 ENCOUNTER — Ambulatory Visit (INDEPENDENT_AMBULATORY_CARE_PROVIDER_SITE_OTHER): Payer: Medicare HMO | Admitting: Internal Medicine

## 2019-01-09 VITALS — Ht 67.0 in | Wt 134.4 lb

## 2019-01-09 DIAGNOSIS — N200 Calculus of kidney: Secondary | ICD-10-CM

## 2019-01-09 DIAGNOSIS — R39198 Other difficulties with micturition: Secondary | ICD-10-CM | POA: Diagnosis not present

## 2019-01-09 DIAGNOSIS — R109 Unspecified abdominal pain: Secondary | ICD-10-CM | POA: Diagnosis not present

## 2019-01-09 DIAGNOSIS — G8929 Other chronic pain: Secondary | ICD-10-CM | POA: Diagnosis not present

## 2019-01-09 DIAGNOSIS — R300 Dysuria: Secondary | ICD-10-CM | POA: Diagnosis not present

## 2019-01-09 DIAGNOSIS — R319 Hematuria, unspecified: Secondary | ICD-10-CM | POA: Diagnosis not present

## 2019-01-09 DIAGNOSIS — I509 Heart failure, unspecified: Secondary | ICD-10-CM

## 2019-01-09 DIAGNOSIS — M545 Low back pain: Secondary | ICD-10-CM

## 2019-01-09 DIAGNOSIS — Z298 Encounter for other specified prophylactic measures: Secondary | ICD-10-CM

## 2019-01-09 MED ORDER — TAMSULOSIN HCL 0.4 MG PO CAPS: 0.4000 mg | ORAL_CAPSULE | Freq: Every day | ORAL | 0 refills | Status: DC

## 2019-01-09 NOTE — Progress Notes (Signed)
Telephone Note  I connected with Martin Hernandez  on 01/10/19 at  3:00 PM EDT by telehone and verified that I am speaking with the correct person using two identifiers.  Location patient: home Location provider:work or home office Persons participating in the virtual visit: patient, provider  I discussed the limitations of evaluation and management by telemedicine and the availability of in person appointments. The patient expressed understanding and agreed to proceed.   HPI: 1. C/o x 3 months right sided back and flank pain > left feels like ball pulling inside and h/o imaging Ct with left kidney stone. No burning with urination c/o urine coming out but slow and increased frequency and lower ab pain    ROS: See pertinent positives and negatives per HPI.  Past Medical History:  Diagnosis Date  . Allergy   . CAD (coronary artery disease)   . Depression   . Frequent headaches   . GERD (gastroesophageal reflux disease)   . Headache   . Hyperlipidemia   . Hypertension   . PAD (peripheral artery disease) (HCC)   . Tobacco abuse     Past Surgical History:  Procedure Laterality Date  . COLONOSCOPY WITH PROPOFOL N/A 06/20/2017   Procedure: COLONOSCOPY WITH PROPOFOL;  Surgeon: Wyline Mood, MD;  Location[facility] ENDOSCOPY;  Service: Gastroenterology;  Laterality: N/A;  . COLONOSCOPY WITH PROPOFOL N/A 07/24/2017   Procedure: COLONOSCOPY WITH PROPOFOL;  Surgeon: Wyline Mood, MD;  Location: [facility] ENDOSCOPY;  Service: Gastroenterology;  Laterality: N/A;  . ESOPHAGOGASTRODUODENOSCOPY (EGD) WITH PROPOFOL N/A 06/20/2017   Procedure: ESOPHAGOGASTRODUODENOSCOPY (EGD) WITH PROPOFOL;  Surgeon: Wyline Mood, MD;  Location: [facility] ENDOSCOPY;  Service: Gastroenterology;  Laterality: N/A;  . PERCUTANEOUS CORONARY STENT INTERVENTION (PCI-S)    . STOMACH SURGERY     2009 [facility] per chart review pt had AAA repair   . TONSILLECTOMY      Family History  Problem Relation Age of Onset  . Diabetes Mother   .  Heart disease Mother   . Hypertension Mother   . Stroke Mother   . Prostate cancer Father   . Cancer Father        prostate cancer in 15s  . Diabetes Sister   . Heart disease Sister   . Ulcers Other        unknown who had ulcers in family     SOCIAL HX:  Disabled  4 kids 2 living  Used to do mill work  Smoker since age 70 max 2 pk per week now 1 ppd as of 02/2017  Married    Current Outpatient Medications:  .  amLODipine (NORVASC) 2.5 MG tablet, Take 2.5 mg by mouth daily. , Disp: , Rfl:  .  aspirin 81 MG tablet, Take 81 mg by mouth daily., Disp: , Rfl:  .  atorvastatin (LIPITOR) 80 MG tablet, Take 1 tablet (80 mg total) by mouth daily at 6 PM., Disp: 90 tablet, Rfl: 3 .  Cholecalciferol 1.25 MG (50000 UT) capsule, Take 1 capsule (50,000 Units total) by mouth once a week., Disp: 13 capsule, Rfl: 1 .  clopidogrel (PLAVIX) 75 MG tablet, 1 pill daily, Disp: 90 tablet, Rfl: 3 .  fluticasone (FLONASE) 50 MCG/ACT nasal spray, Place 1-2 sprays into both nostrils daily., Disp: 16 g, Rfl: 11 .  gabapentin (NEURONTIN) 300 MG capsule, Take 1 capsule (300 mg total) by mouth 2 (two) times daily., Disp: 30 capsule, Rfl: 0 .  ipratropium (ATROVENT) 0.02 % nebulizer solution, Inhale into the lungs., Disp: , Rfl:  .  isosorbide mononitrate (IMDUR) 30 MG 24 hr tablet, Take 60 mg by mouth 2 (two) times daily. , Disp: , Rfl:  .  lisinopril (PRINIVIL,ZESTRIL) 5 MG tablet, 1 pill daily, Disp: 90 tablet, Rfl: 3 .  metoprolol succinate (TOPROL-XL) 25 MG 24 hr tablet, Take 1 tablet (25 mg total) by mouth daily., Disp: 90 tablet, Rfl: 3 .  mirtazapine (REMERON) 30 MG tablet, 1 pill nightly, Disp: 90 tablet, Rfl: 3 .  nitroGLYCERIN (NITROSTAT) 0.4 MG SL tablet, Place 1 tablet (0.4 mg total) under the tongue every 5 (five) minutes as needed for chest pain., Disp: 90 tablet, Rfl: 3 .  ondansetron (ZOFRAN) 4 MG tablet, Take 1 tablet (4 mg total) by mouth 2 (two) times daily as needed for nausea or vomiting.,  Disp: 60 tablet, Rfl: 2 .  oxyCODONE-acetaminophen (PERCOCET) 10-325 MG tablet, , Disp: , Rfl:  .  pantoprazole (PROTONIX) 40 MG tablet, Take 1 tablet (40 mg total) by mouth daily. 30 minutes before food, Disp: 30 tablet, Rfl: 0 .  pyridOXINE (VITAMIN B-6) 100 MG tablet, Take 1 tablet (100 mg total) by mouth daily., Disp: 90 tablet, Rfl: 3 .  sildenafil (REVATIO) 20 MG tablet, 1-5 pills as needed before sex 30 minutes to 4 hours, Disp: 30 tablet, Rfl: 11 .  zolpidem (AMBIEN) 10 MG tablet, Take 1 tablet (10 mg total) by mouth at bedtime as needed for sleep., Disp: 30 tablet, Rfl: 5 .  tamsulosin (FLOMAX) 0.4 MG CAPS capsule, Take 1 capsule (0.4 mg total) by mouth daily after supper., Disp: 90 capsule, Rfl: 0  EXAM:  VITALS per patient if applicable:  GENERAL: alert, oriented, appears well and in no acute distress  PSYCH/NEURO: pleasant and cooperative, no obvious depression or anxiety, speech and thought processing grossly intact  ASSESSMENT AND PLAN:  Discussed the following assessment and plan:  Abdominal pain,lower abdominal, b/l flank and low back pain R>L and change in urine flow and freq and hematuria DDx kidney stone r/o UTI h/o left kidney stone on imaging CT 08/2017 left kidney stone- Plan: Comprehensive metabolic panel, CBC with Differential/Platelet, UA and culture Ambulatory referral to Urology stat  Add flomax to see if helps  Cardiac history will not Rx NSAID I.e toradol   HM Declines flu shot Had3/3 doses hep B check titer in future  Will disc Tdap and shingrix in future PSA had11/20/19 1.67 normal DRE in futurecheck PSA upcoming Hep C negative  -h/o hematuria CT 08/2017 with left kidney stone could be etiology HIV neg 03/29/15  Colonoscopy 3 and 07/2017 hadpoor prep diverticulosis  -f/u GI 03/05/18 will ask if will order CT liver protocol for liver lesion -of note will [facility] never had CT ab/pelvis sch 11 or 03/2018 -per pts wife cardiology does not want  further procedures for now and especially without their approval  sch fasting labs 02/28/19   Will need to see eye MD in future  -we discussed possible serious and likely etiologies, options for evaluation and workup, limitations of telemedicine visit vs in person visit, treatment, treatment risks and precautions. Pt prefers to treat via telemedicine empirically rather then risking or undertaking an in person visit at this moment. Patient agrees to seek prompt in person care if worsening, new symptoms arise, or if is not improving with treatment.   I discussed the assessment and treatment plan with the patient. The patient was provided an opportunity to ask questions and all were answered. The patient agreed with the plan and demonstrated an understanding  of the instructions.   The patient was advised to call back or seek an in-person evaluation if the symptoms worsen or if the condition fails to improve as anticipated.  Time spent 15 minutes  Delorise Jackson, MD

## 2019-01-10 DIAGNOSIS — R300 Dysuria: Secondary | ICD-10-CM | POA: Diagnosis not present

## 2019-01-10 DIAGNOSIS — R39198 Other difficulties with micturition: Secondary | ICD-10-CM | POA: Diagnosis not present

## 2019-01-10 DIAGNOSIS — N200 Calculus of kidney: Secondary | ICD-10-CM | POA: Diagnosis not present

## 2019-01-10 DIAGNOSIS — R109 Unspecified abdominal pain: Secondary | ICD-10-CM | POA: Diagnosis not present

## 2019-01-12 LAB — CBC WITH DIFFERENTIAL/PLATELET
Basophils Absolute: 0 10*3/uL (ref 0.0–0.2)
Basos: 1 %
EOS (ABSOLUTE): 0.1 10*3/uL (ref 0.0–0.4)
Eos: 1 %
Hematocrit: 42.9 % (ref 37.5–51.0)
Hemoglobin: 14.8 g/dL (ref 13.0–17.7)
Immature Grans (Abs): 0 10*3/uL (ref 0.0–0.1)
Immature Granulocytes: 0 %
Lymphocytes Absolute: 2.2 10*3/uL (ref 0.7–3.1)
Lymphs: 34 %
MCH: 31.8 pg (ref 26.6–33.0)
MCHC: 34.5 g/dL (ref 31.5–35.7)
MCV: 92 fL (ref 79–97)
Monocytes Absolute: 0.8 10*3/uL (ref 0.1–0.9)
Monocytes: 12 %
Neutrophils Absolute: 3.4 10*3/uL (ref 1.4–7.0)
Neutrophils: 52 %
Platelets: 285 10*3/uL (ref 150–450)
RBC: 4.66 x10E6/uL (ref 4.14–5.80)
RDW: 13.2 % (ref 11.6–15.4)
WBC: 6.6 10*3/uL (ref 3.4–10.8)

## 2019-01-12 LAB — URINALYSIS, ROUTINE W REFLEX MICROSCOPIC
Bilirubin, UA: NEGATIVE
Glucose, UA: NEGATIVE
Ketones, UA: NEGATIVE
Leukocytes,UA: NEGATIVE
Nitrite, UA: NEGATIVE
Protein,UA: NEGATIVE
RBC, UA: NEGATIVE
Specific Gravity, UA: >=1.03 — AB (ref 1.005–1.030)
Urobilinogen, Ur: 0.2 mg/dL (ref 0.2–1.0)
pH, UA: 5.5 (ref 5.0–7.5)

## 2019-01-12 LAB — COMPREHENSIVE METABOLIC PANEL
ALT: 18 IU/L (ref 0–44)
AST: 24 IU/L (ref 0–40)
Albumin/Globulin Ratio: 2.3 — ABNORMAL HIGH (ref 1.2–2.2)
Albumin: 4.8 g/dL (ref 3.8–4.8)
Alkaline Phosphatase: 54 IU/L (ref 39–117)
BUN/Creatinine Ratio: 18 (ref 10–24)
BUN: 19 mg/dL (ref 8–27)
Bilirubin Total: <0.2 mg/dL (ref 0.0–1.2)
CO2: 24 mmol/L (ref 20–29)
Calcium: 9.5 mg/dL (ref 8.6–10.2)
Chloride: 102 mmol/L (ref 96–106)
Creatinine, Ser: 1.06 mg/dL (ref 0.76–1.27)
GFR calc Af Amer: 85 mL/min/{1.73_m2} (ref >59)
GFR calc non Af Amer: 74 mL/min/{1.73_m2} (ref >59)
Globulin, Total: 2.1 g/dL (ref 1.5–4.5)
Glucose: 56 mg/dL — ABNORMAL LOW (ref 65–99)
Potassium: 4.5 mmol/L (ref 3.5–5.2)
Sodium: 139 mmol/L (ref 134–144)
Total Protein: 6.9 g/dL (ref 6.0–8.5)

## 2019-01-12 LAB — URINE CULTURE

## 2019-01-13 DIAGNOSIS — M25551 Pain in right hip: Secondary | ICD-10-CM | POA: Diagnosis not present

## 2019-01-13 DIAGNOSIS — Z95810 Presence of automatic (implantable) cardiac defibrillator: Secondary | ICD-10-CM | POA: Diagnosis not present

## 2019-01-13 DIAGNOSIS — M79606 Pain in leg, unspecified: Secondary | ICD-10-CM | POA: Diagnosis not present

## 2019-01-13 DIAGNOSIS — G894 Chronic pain syndrome: Secondary | ICD-10-CM | POA: Diagnosis not present

## 2019-01-13 DIAGNOSIS — Z4502 Encounter for adjustment and management of automatic implantable cardiac defibrillator: Secondary | ICD-10-CM | POA: Diagnosis not present

## 2019-01-13 DIAGNOSIS — M47816 Spondylosis without myelopathy or radiculopathy, lumbar region: Secondary | ICD-10-CM | POA: Diagnosis not present

## 2019-01-14 ENCOUNTER — Other Ambulatory Visit: Payer: Self-pay | Admitting: Internal Medicine

## 2019-01-14 DIAGNOSIS — F329 Major depressive disorder, single episode, unspecified: Secondary | ICD-10-CM

## 2019-01-14 DIAGNOSIS — F32A Depression, unspecified: Secondary | ICD-10-CM

## 2019-01-14 DIAGNOSIS — I2510 Atherosclerotic heart disease of native coronary artery without angina pectoris: Secondary | ICD-10-CM

## 2019-01-14 DIAGNOSIS — H6991 Unspecified Eustachian tube disorder, right ear: Secondary | ICD-10-CM

## 2019-01-14 DIAGNOSIS — G4700 Insomnia, unspecified: Secondary | ICD-10-CM

## 2019-01-14 MED ORDER — MIRTAZAPINE 30 MG PO TABS: ORAL_TABLET | ORAL | 3 refills | Status: DC

## 2019-01-14 MED ORDER — LISINOPRIL 5 MG PO TABS: ORAL_TABLET | ORAL | 3 refills | Status: DC

## 2019-01-14 MED ORDER — ATORVASTATIN CALCIUM 80 MG PO TABS: 80.0000 mg | ORAL_TABLET | Freq: Every day | ORAL | 3 refills | Status: DC

## 2019-01-17 ENCOUNTER — Other Ambulatory Visit: Payer: Self-pay

## 2019-01-17 ENCOUNTER — Other Ambulatory Visit: Payer: Self-pay | Admitting: Internal Medicine

## 2019-01-17 DIAGNOSIS — R110 Nausea: Secondary | ICD-10-CM

## 2019-01-17 DIAGNOSIS — R319 Hematuria, unspecified: Secondary | ICD-10-CM

## 2019-01-17 MED ORDER — ONDANSETRON HCL 4 MG PO TABS: 4.0000 mg | ORAL_TABLET | Freq: Two times a day (BID) | ORAL | 2 refills | Status: DC | PRN

## 2019-01-21 ENCOUNTER — Ambulatory Visit: Payer: Medicare Other | Admitting: Urology

## 2019-01-21 ENCOUNTER — Other Ambulatory Visit: Payer: Self-pay | Admitting: Internal Medicine

## 2019-01-21 DIAGNOSIS — I2510 Atherosclerotic heart disease of native coronary artery without angina pectoris: Secondary | ICD-10-CM

## 2019-01-21 DIAGNOSIS — K2970 Gastritis, unspecified, without bleeding: Secondary | ICD-10-CM

## 2019-01-21 MED ORDER — PANTOPRAZOLE SODIUM 40 MG PO TBEC: 40.0000 mg | DELAYED_RELEASE_TABLET | Freq: Every day | ORAL | 3 refills | Status: DC

## 2019-01-21 MED ORDER — METOPROLOL SUCCINATE ER 25 MG PO TB24: 25.0000 mg | ORAL_TABLET | Freq: Every day | ORAL | 3 refills | Status: DC

## 2019-01-28 ENCOUNTER — Telehealth: Payer: Self-pay | Admitting: Internal Medicine

## 2019-01-28 NOTE — Telephone Encounter (Signed)
Has urology consult been scheduled?   [place]

## 2019-01-28 NOTE — Telephone Encounter (Signed)
Informed patient that it was sent to [facility] on the 13. Patient forgot it was going there. Closing note since patient was informed.

## 2019-01-28 NOTE — Telephone Encounter (Signed)
Patient calling to request refill of "stomach medication". Patient did not know the name of the medication. He is requesting a call back.

## 2019-01-28 NOTE — Telephone Encounter (Signed)
How is he doing any abdominal pain, flank pain, back pain, urinary sx's?  Did he pass kidney stone?   Is flomax helping ? If so will refill   [place]

## 2019-01-29 NOTE — Telephone Encounter (Signed)
Patient did have some pain but everything is a lot better. He hasn't passed anything. Also he stated he didn't show for an appointment due to not having the co pay. Flomax is helping. He would like a rx refilled and sent to [facility]

## 2019-01-31 NOTE — Telephone Encounter (Signed)
He was scheduled for 10/13 at [facility]. They called him to schedule and confirmed the appointment. He no showed the appointment.

## 2019-02-12 DIAGNOSIS — M792 Neuralgia and neuritis, unspecified: Secondary | ICD-10-CM | POA: Diagnosis not present

## 2019-02-12 DIAGNOSIS — M25551 Pain in right hip: Secondary | ICD-10-CM | POA: Diagnosis not present

## 2019-02-12 DIAGNOSIS — G894 Chronic pain syndrome: Secondary | ICD-10-CM | POA: Diagnosis not present

## 2019-02-12 DIAGNOSIS — M47816 Spondylosis without myelopathy or radiculopathy, lumbar region: Secondary | ICD-10-CM | POA: Diagnosis not present

## 2019-02-24 ENCOUNTER — Other Ambulatory Visit: Payer: Self-pay

## 2019-02-26 ENCOUNTER — Encounter: Payer: Self-pay | Admitting: Internal Medicine

## 2019-02-26 ENCOUNTER — Other Ambulatory Visit: Payer: Self-pay | Admitting: Internal Medicine

## 2019-02-26 ENCOUNTER — Ambulatory Visit (INDEPENDENT_AMBULATORY_CARE_PROVIDER_SITE_OTHER): Payer: Medicare HMO | Admitting: Internal Medicine

## 2019-02-26 ENCOUNTER — Encounter: Payer: Self-pay | Admitting: Gastroenterology

## 2019-02-26 ENCOUNTER — Ambulatory Visit: Payer: Medicare HMO | Admitting: Gastroenterology

## 2019-02-26 ENCOUNTER — Other Ambulatory Visit: Payer: Self-pay

## 2019-02-26 VITALS — BP 130/80 | HR 72 | Temp 97.2°F | Ht 69.0 in | Wt 130.8 lb

## 2019-02-26 DIAGNOSIS — Z125 Encounter for screening for malignant neoplasm of prostate: Secondary | ICD-10-CM

## 2019-02-26 DIAGNOSIS — R634 Abnormal weight loss: Secondary | ICD-10-CM | POA: Diagnosis not present

## 2019-02-26 DIAGNOSIS — N200 Calculus of kidney: Secondary | ICD-10-CM | POA: Diagnosis not present

## 2019-02-26 DIAGNOSIS — I2510 Atherosclerotic heart disease of native coronary artery without angina pectoris: Secondary | ICD-10-CM | POA: Diagnosis not present

## 2019-02-26 DIAGNOSIS — E559 Vitamin D deficiency, unspecified: Secondary | ICD-10-CM

## 2019-02-26 DIAGNOSIS — R339 Retention of urine, unspecified: Secondary | ICD-10-CM

## 2019-02-26 DIAGNOSIS — Z1329 Encounter for screening for other suspected endocrine disorder: Secondary | ICD-10-CM

## 2019-02-26 DIAGNOSIS — R39198 Other difficulties with micturition: Secondary | ICD-10-CM | POA: Diagnosis not present

## 2019-02-26 LAB — TSH: TSH: 1.3 u[IU]/mL (ref 0.35–4.50)

## 2019-02-26 LAB — LIPID PANEL
Cholesterol: 100 mg/dL (ref 0–200)
HDL: 34.9 mg/dL — ABNORMAL LOW (ref >39.00)
LDL Cholesterol: 41 mg/dL (ref 0–99)
NonHDL: 65.41
Total CHOL/HDL Ratio: 3
Triglycerides: 120 mg/dL (ref 0.0–149.0)
VLDL: 24 mg/dL (ref 0.0–40.0)

## 2019-02-26 LAB — PSA, MEDICARE: PSA: 2.19 ng/ml (ref 0.10–4.00)

## 2019-02-26 LAB — VITAMIN D 25 HYDROXY (VIT D DEFICIENCY, FRACTURES): VITD: 16.07 ng/mL — ABNORMAL LOW (ref 30.00–100.00)

## 2019-02-26 MED ORDER — TAMSULOSIN HCL 0.4 MG PO CAPS: 0.4000 mg | ORAL_CAPSULE | Freq: Every day | ORAL | 3 refills | Status: DC

## 2019-02-26 MED ORDER — CHOLECALCIFEROL 1.25 MG (50000 UT) PO CAPS: 50000.0000 [IU] | ORAL_CAPSULE | ORAL | 1 refills | Status: DC

## 2019-02-26 NOTE — Progress Notes (Signed)
Chief Complaint  Patient presents with  . Follow-up   F/u  1. Left flank pain resolved with h/o 6 mm kidney stone not passed yet last UA and culture 01/10/2019 neg UTI no blood flomax is helping with pain he has weak stream and urinating 2x qhs does not have funds to see urology wants refill of flomax helping  2. CAD/ischemic cardiomyopathy no chest pain today compliant with meds   3. Vit d def   4. Weight loss x 4 lbs overall trended up he reports reduced po intake overall and no n/v ab pain    Review of Systems  Constitutional: Positive for weight loss.  HENT: Negative for hearing loss.   Eyes: Negative for blurred vision.  Respiratory: Negative for shortness of breath.   Cardiovascular: Negative for chest pain.  Gastrointestinal: Negative for abdominal pain, nausea and vomiting.  Genitourinary: Negative for dysuria.  Musculoskeletal: Negative for falls.  Skin: Negative for rash.  Neurological: Negative for headaches.  Psychiatric/Behavioral: Negative for depression.   Past Medical History:  Diagnosis Date  . Allergy   . CAD (coronary artery disease)   . Depression   . Frequent headaches   . GERD (gastroesophageal reflux disease)   . Headache   . Hyperlipidemia   . Hypertension   . PAD (peripheral artery disease) (HCC)   . Tobacco abuse    Past Surgical History:  Procedure Laterality Date  . COLONOSCOPY WITH PROPOFOL N/A 06/20/2017   Procedure: COLONOSCOPY WITH PROPOFOL;  Surgeon: Wyline Mood, MD;  Location: [facility] ENDOSCOPY;  Service: Gastroenterology;  Laterality: N/A;  . COLONOSCOPY WITH PROPOFOL N/A 07/24/2017   Procedure: COLONOSCOPY WITH PROPOFOL;  Surgeon: Wyline Mood, MD;  Location: [facility] ENDOSCOPY;  Service: Gastroenterology;  Laterality: N/A;  . ESOPHAGOGASTRODUODENOSCOPY (EGD) WITH PROPOFOL N/A 06/20/2017   Procedure: ESOPHAGOGASTRODUODENOSCOPY (EGD) WITH PROPOFOL;  Surgeon: Wyline Mood, MD;  Location: [facility] ENDOSCOPY;  Service: Gastroenterology;  Laterality: N/A;   . PERCUTANEOUS CORONARY STENT INTERVENTION (PCI-S)    . STOMACH SURGERY     2009 [facility] per chart review pt had AAA repair   . TONSILLECTOMY     Family History  Problem Relation Age of Onset  . Diabetes Mother   . Heart disease Mother   . Hypertension Mother   . Stroke Mother   . Prostate cancer Father   . Cancer Father        prostate cancer in 20s  . Diabetes Sister   . Heart disease Sister   . Ulcers Other        unknown who had ulcers in family    Social History   Socioeconomic History  . Marital status: Married    Spouse name: Not on file  . Number of children: Not on file  . Years of education: Not on file  . Highest education level: Not on file  Occupational History  . Not on file  Social Needs  . Financial resource strain: Not hard at all  . Food insecurity    Worry: Never true    Inability: Never true  . Transportation needs    Medical: No    Non-medical: No  Tobacco Use  . Smoking status: Current Every Day Smoker    Packs/day: 0.25    Types: Cigarettes  . Smokeless tobacco: Never Used  Substance and Sexual Activity  . Alcohol use: No    Alcohol/week: 0.0 standard drinks    Comment: quit 1 year ago  . Drug use: No  . Sexual activity: Not Currently  Lifestyle  . Physical activity    Days per week: Not on file    Minutes per session: Not on file  . Stress: Not on file  Relationships  . Social Musician on phone: Not on file    Gets together: Not on file    Attends religious service: Not on file    Active member of club or organization: Not on file    Attends meetings of clubs or organizations: Not on file    Relationship status: Not on file  . Intimate partner violence    Fear of current or ex partner: No    Emotionally abused: No    Physically abused: No    Forced sexual activity: No  Other Topics Concern  . Not on file  Social History Narrative   Disabled    4 kids 2 living    Used to do mill work    Smoker since age 85 max 2  pk per week now 1 ppd as of 02/2017    Married    Current Meds  Medication Sig  . aspirin 81 MG tablet Take 81 mg by mouth daily.  Marland Kitchen atorvastatin (LIPITOR) 80 MG tablet Take 1 tablet (80 mg total) by mouth daily at 6 PM.  . Cholecalciferol 1.25 MG (50000 UT) capsule Take 1 capsule (50,000 Units total) by mouth once a week.  . clopidogrel (PLAVIX) 75 MG tablet 1 pill daily  . fluticasone (FLONASE) 50 MCG/ACT nasal spray Place 1-2 sprays into both nostrils daily.  Marland Kitchen gabapentin (NEURONTIN) 300 MG capsule Take 1 capsule (300 mg total) by mouth 2 (two) times daily.  Marland Kitchen ipratropium (ATROVENT) 0.02 % nebulizer solution Inhale into the lungs.  . isosorbide mononitrate (IMDUR) 30 MG 24 hr tablet Take 60 mg by mouth 2 (two) times daily.   Marland Kitchen lisinopril (ZESTRIL) 5 MG tablet 1 pill daily  . metoprolol succinate (TOPROL-XL) 25 MG 24 hr tablet Take 1 tablet (25 mg total) by mouth daily.  . mirtazapine (REMERON) 30 MG tablet 1 pill nightly  . nitroGLYCERIN (NITROSTAT) 0.4 MG SL tablet Place 1 tablet (0.4 mg total) under the tongue every 5 (five) minutes as needed for chest pain.  Marland Kitchen ondansetron (ZOFRAN) 4 MG tablet Take 1 tablet (4 mg total) by mouth 2 (two) times daily as needed for nausea or vomiting.  Marland Kitchen oxyCODONE-acetaminophen (PERCOCET) 10-325 MG tablet   . pantoprazole (PROTONIX) 40 MG tablet Take 1 tablet (40 mg total) by mouth daily. 30 minutes before food  . pyridOXINE (VITAMIN B-6) 100 MG tablet Take 1 tablet (100 mg total) by mouth daily.  . sildenafil (REVATIO) 20 MG tablet 1-5 pills as needed before sex 30 minutes to 4 hours  . tamsulosin (FLOMAX) 0.4 MG CAPS capsule Take 1 capsule (0.4 mg total) by mouth daily after supper.  . zolpidem (AMBIEN) 10 MG tablet Take 1 tablet (10 mg total) by mouth at bedtime as needed for sleep.  . [DISCONTINUED] tamsulosin (FLOMAX) 0.4 MG CAPS capsule Take 1 capsule (0.4 mg total) by mouth daily after supper.   No Known Allergies Recent Results (from the past  2160 hour(s))  Urinalysis, Routine w reflex microscopic     Status: Abnormal   Collection Time: 01/10/19  9:45 AM  Result Value Ref Range   Specific Gravity, UA      >=1.030 (A) 1.005 - 1.030   pH, UA 5.5 5.0 - 7.5   Color, UA Yellow Yellow   Appearance Ur Clear  Clear   Leukocytes,UA Negative Negative   Protein,UA Negative Negative/Trace   Glucose, UA Negative Negative   Ketones, UA Negative Negative   RBC, UA Negative Negative   Bilirubin, UA Negative Negative   Urobilinogen, Ur 0.2 0.2 - 1.0 mg/dL   Nitrite, UA Negative Negative   Microscopic Examination Comment     Comment: Microscopic not indicated and not performed.  Comprehensive metabolic panel     Status: Abnormal   Collection Time: 01/10/19  9:45 AM  Result Value Ref Range   Glucose 56 (L) 65 - 99 mg/dL   BUN 19 8 - 27 mg/dL   Creatinine, Ser 1.611.06 0.76 - 1.27 mg/dL   GFR calc non Af Amer 74 >59 mL/min/1.73   GFR calc Af Amer 85 >59 mL/min/1.73   BUN/Creatinine Ratio 18 10 - 24   Sodium 139 134 - 144 mmol/L   Potassium 4.5 3.5 - 5.2 mmol/L   Chloride 102 96 - 106 mmol/L   CO2 24 20 - 29 mmol/L   Calcium 9.5 8.6 - 10.2 mg/dL   Total Protein 6.9 6.0 - 8.5 g/dL   Albumin 4.8 3.8 - 4.8 g/dL   Globulin, Total 2.1 1.5 - 4.5 g/dL   Albumin/Globulin Ratio 2.3 (H) 1.2 - 2.2   Bilirubin Total <0.2 0.0 - 1.2 mg/dL   Alkaline Phosphatase 54 39 - 117 IU/L   AST 24 0 - 40 IU/L   ALT 18 0 - 44 IU/L  CBC with Differential/Platelet     Status: None   Collection Time: 01/10/19  9:45 AM  Result Value Ref Range   WBC 6.6 3.4 - 10.8 x10E3/uL   RBC 4.66 4.14 - 5.80 x10E6/uL   Hemoglobin 14.8 13.0 - 17.7 g/dL   Hematocrit 09.642.9 04.537.5 - 51.0 %   MCV 92 79 - 97 fL   MCH 31.8 26.6 - 33.0 pg   MCHC 34.5 31.5 - 35.7 g/dL   RDW 40.913.2 81.111.6 - 91.415.4 %   Platelets 285 150 - 450 x10E3/uL   Neutrophils 52 Not Estab. %   Lymphs 34 Not Estab. %   Monocytes 12 Not Estab. %   Eos 1 Not Estab. %   Basos 1 Not Estab. %   Neutrophils Absolute 3.4  1.4 - 7.0 x10E3/uL   Lymphocytes Absolute 2.2 0.7 - 3.1 x10E3/uL   Monocytes Absolute 0.8 0.1 - 0.9 x10E3/uL   EOS (ABSOLUTE) 0.1 0.0 - 0.4 x10E3/uL   Basophils Absolute 0.0 0.0 - 0.2 x10E3/uL   Immature Granulocytes 0 Not Estab. %   Immature Grans (Abs) 0.0 0.0 - 0.1 x10E3/uL  Urine Culture     Status: None   Collection Time: 01/10/19  9:45 AM   UC  Result Value Ref Range   Urine Culture, Routine Final report    Organism ID, Bacteria Comment     Comment: Culture shows less than 10,000 colony forming units of bacteria per milliliter of urine. This colony count is not generally considered to be clinically significant.    Objective  Body mass index is 19.32 kg/m. Wt Readings from Last 3 Encounters:  02/26/19 130 lb 12.8 oz (59.3 kg)  01/09/19 134 lb 6.4 oz (61 kg)  05/30/18 134 lb 6.4 oz (61 kg)   Temp Readings from Last 3 Encounters:  02/26/19 (!) 97.2 F (36.2 C) (Temporal)  05/30/18 98.2 F (36.8 C) (Oral)  04/30/18 97.6 F (36.4 C) (Oral)   BP Readings from Last 3 Encounters:  02/26/19 130/80  05/30/18 134/72  04/30/18 120/88   Pulse Readings from Last 3 Encounters:  02/26/19 72  05/30/18 78  04/30/18 80    Physical Exam Vitals signs and nursing note reviewed. Chaperone present: +mask on   Constitutional:      Appearance: Normal appearance. He is well-developed and well-groomed.     Comments: +mask on    HENT:     Head: Normocephalic and atraumatic.  Eyes:     Conjunctiva/sclera: Conjunctivae normal.     Pupils: Pupils are equal, round, and reactive to light.  Cardiovascular:     Rate and Rhythm: Normal rate and regular rhythm.     Heart sounds: Normal heart sounds. No murmur.  Pulmonary:     Effort: Pulmonary effort is normal.     Breath sounds: Normal breath sounds.  Abdominal:     General: Abdomen is flat. Bowel sounds are normal.     Tenderness: There is no abdominal tenderness. There is no left CVA tenderness.  Skin:    General: Skin is warm  and dry.  Neurological:     General: No focal deficit present.     Mental Status: He is alert and oriented to person, place, and time. Mental status is at baseline.     Gait: Gait normal.  Psychiatric:        Attention and Perception: Attention and perception normal.        Mood and Affect: Mood and affect normal.        Speech: Speech normal.        Behavior: Behavior normal. Behavior is cooperative.        Thought Content: Thought content normal.        Cognition and Memory: Cognition and memory normal.        Judgment: Judgment normal.     Assessment  Plan  Coronary artery disease involving native coronary artery of native heart without angina pectoris - Plan: Lipid panel Cont meds and cards f/u   Kidney stone - Plan: tamsulosin (FLOMAX) 0.4 MG CAPS capsule Subjective change in urination - Plan: tamsulosin (FLOMAX) 0.4 MG CAPS capsule Costly to f/u urology for now hold   Vitamin D deficiency - Plan: Vitamin D (25 hydroxy)  Weight loss encouraged po intake given 6 glucerna   HM Declines flu shot Had3/3 doses hep B check titerin future consider  Will disc Tdap and shingrix in future  PSA had11/20/19 1.67 normal DRE in futurecheck PSA upcoming Hep C negative -h/o hematuria CT 08/2017 with left kidney 6 mm stone could be etiology HIV neg 03/29/15  Colonoscopy 3 and 07/2017 hadpoor prep diverticulosis  -f/u GI 03/05/18 will ask if will order CT liver protocol for liver lesion -of note will [facility] never had CT ab/pelvis sch 11 or 03/2018 -per pts wife cardiology does not want further procedures for now and especially without their approvalpreviously and as of 02/26/19 no $ to f/u GI     Will need to see eye MD in futurehold for now per pt   Provider: Dr. Olivia Mackie McLean-Scocuzza-Internal Medicine

## 2019-02-26 NOTE — Patient Instructions (Signed)
Premier protein   Try to eat please and drink lemon water  Dietary Guidelines to Help Prevent Kidney Stones Kidney stones are deposits of minerals and salts that form inside your kidneys. Your risk of developing kidney stones may be greater depending on your diet, your lifestyle, the medicines you take, and whether you have certain medical conditions. Most people can reduce their chances of developing kidney stones by following the instructions below. Depending on your overall health and the type of kidney stones you tend to develop, your dietitian may give you more specific instructions. What are tips for following this plan? Reading food labels  Choose foods with "no salt added" or "low-salt" labels. Limit your sodium intake to less than 1500 mg per day.  Choose foods with calcium for each meal and snack. Try to eat about 300 mg of calcium at each meal. Foods that contain 200-500 mg of calcium per serving include: ? 8 oz (237 ml) of milk, fortified nondairy milk, and fortified fruit juice. ? 8 oz (237 ml) of kefir, yogurt, and soy yogurt. ? 4 oz (118 ml) of tofu. ? 1 oz of cheese. ? 1 cup (300 g) of dried figs. ? 1 cup (91 g) of cooked broccoli. ? 1-3 oz can of sardines or mackerel.  Most people need 1000 to 1500 mg of calcium each day. Talk to your dietitian about how much calcium is recommended for you. Shopping  Buy plenty of fresh fruits and vegetables. Most people do not need to avoid fruits and vegetables, even if they contain nutrients that may contribute to kidney stones.  When shopping for convenience foods, choose: ? Whole pieces of fruit. ? Premade salads with dressing on the side. ? Low-fat fruit and yogurt smoothies.  Avoid buying frozen meals or prepared deli foods.  Look for foods with live cultures, such as yogurt and kefir. Cooking  Do not add salt to food when cooking. Place a salt shaker on the table and allow each person to add his or her own salt to taste.   Use vegetable protein, such as beans, textured vegetable protein (TVP), or tofu instead of meat in pasta, casseroles, and soups. Meal planning   Eat less salt, if told by your dietitian. To do this: ? Avoid eating processed or premade food. ? Avoid eating fast food.  Eat less animal protein, including cheese, meat, poultry, or fish, if told by your dietitian. To do this: ? Limit the number of times you have meat, poultry, fish, or cheese each week. Eat a diet free of meat at least 2 days a week. ? Eat only one serving each day of meat, poultry, fish, or seafood. ? When you prepare animal protein, cut pieces into small portion sizes. For most meat and fish, one serving is about the size of one deck of cards.  Eat at least 5 servings of fresh fruits and vegetables each day. To do this: ? Keep fruits and vegetables on hand for snacks. ? Eat 1 piece of fruit or a handful of berries with breakfast. ? Have a salad and fruit at lunch. ? Have two kinds of vegetables at dinner.  Limit foods that are high in a substance called oxalate. These include: ? Spinach. ? Rhubarb. ? Beets. ? Potato chips and french fries. ? Nuts.  If you regularly take a diuretic medicine, make sure to eat at least 1-2 fruits or vegetables high in potassium each day. These include: ? Avocado. ? Banana. ? Orange, prune,  carrot, or tomato juice. ? Baked potato. ? Cabbage. ? Beans and split peas. General instructions   Drink enough fluid to keep your urine clear or pale yellow. This is the most important thing you can do.  Talk to your health care provider and dietitian about taking daily supplements. Depending on your health and the cause of your kidney stones, you may be advised: ? Not to take supplements with vitamin C. ? To take a calcium supplement. ? To take a daily probiotic supplement. ? To take other supplements such as magnesium, fish oil, or vitamin B6.  Take all medicines and supplements as told by  your health care provider.  Limit alcohol intake to no more than 1 drink a day for nonpregnant women and 2 drinks a day for men. One drink equals 12 oz of beer, 5 oz of wine, or 1 oz of hard liquor.  Lose weight if told by your health care provider. Work with your dietitian to find strategies and an eating plan that works best for you. What foods are not recommended? Limit your intake of the following foods, or as told by your dietitian. Talk to your dietitian about specific foods you should avoid based on the type of kidney stones and your overall health. Grains Breads. Bagels. Rolls. Baked goods. Salted crackers. Cereal. Pasta. Vegetables Spinach. Rhubarb. Beets. Canned vegetables. Angie Fava. Olives. Meats and other protein foods Nuts. Nut butters. Large portions of meat, poultry, or fish. Salted or cured meats. Deli meats. Hot dogs. Sausages. Dairy Cheese. Beverages Regular soft drinks. Regular vegetable juice. Seasonings and other foods Seasoning blends with salt. Salad dressings. Canned soups. Soy sauce. Ketchup. Barbecue sauce. Canned pasta sauce. Casseroles. Pizza. Lasagna. Frozen meals. Potato chips. [place] fries. Summary  You can reduce your risk of kidney stones by making changes to your diet.  The most important thing you can do is drink enough fluid. You should drink enough fluid to keep your urine clear or pale yellow.  Ask your health care provider or dietitian how much protein from animal sources you should eat each day, and also how much salt and calcium you should have each day. This information is not intended to replace advice given to you by your health care provider. Make sure you discuss any questions you have with your health care provider. Document Released: 07/22/2010 Document Revised: 07/17/2018 Document Reviewed: 03/07/2016 Elsevier Patient Education  2020 [place].  Kidney Stones  Kidney stones (urolithiasis) are rock-like masses that form inside of the  kidneys. Kidneys are organs that make pee (urine). A kidney stone can cause very bad pain and can block the flow of pee. The stone usually leaves your body (passes) through your pee. You may need to have a doctor take out the stone. Follow these instructions at home: Eating and drinking  Drink enough fluid to keep your pee clear or pale yellow. This will help you pass the stone.  If told by your doctor, change the foods you eat (your diet). This may include: ? Limiting how much salt (sodium) you eat. ? Eating more fruits and vegetables. ? Limiting how much meat, poultry, fish, and eggs you eat.  Follow instructions from your doctor about eating or drinking restrictions. General instructions  Collect pee samples as told by your doctor. You may need to collect a pee sample: ? 24 hours after a stone comes out. ? 8-12 weeks after a stone comes out, and every 6-12 months after that.  Strain your pee every  time you pee (urinate), for as long as told. Use the strainer that your doctor recommends.  Do not throw out the stone. Keep it so that it can be tested by your doctor.  Take over-the-counter and prescription medicines only as told by your doctor.  Keep all follow-up visits as told by your doctor. This is important. You may need follow-up tests. Preventing kidney stones To prevent another kidney stone:  Drink enough fluid to keep your pee clear or pale yellow. This is the best way to prevent kidney stones.  Eat healthy foods.  Avoid certain foods as told by your doctor. You may be told to eat less protein.  Stay at a healthy weight. Contact a doctor if:  You have pain that gets worse or does not get better with medicine. Get help right away if:  You have a fever or chills.  You get very bad pain.  You get new pain in your belly (abdomen).  You pass out (faint).  You cannot pee. This information is not intended to replace advice given to you by your health care provider.  Make sure you discuss any questions you have with your health care provider. Document Released: 09/13/2007 Document Revised: 11/06/2017 Document Reviewed: 12/14/2015 Elsevier Patient Education  2020 [employer]

## 2019-03-03 ENCOUNTER — Other Ambulatory Visit: Payer: Medicare HMO

## 2019-03-13 ENCOUNTER — Other Ambulatory Visit: Payer: Self-pay

## 2019-03-13 DIAGNOSIS — Z20822 Contact with and (suspected) exposure to covid-19: Secondary | ICD-10-CM

## 2019-03-14 DIAGNOSIS — M792 Neuralgia and neuritis, unspecified: Secondary | ICD-10-CM | POA: Diagnosis not present

## 2019-03-14 DIAGNOSIS — Z79899 Other long term (current) drug therapy: Secondary | ICD-10-CM | POA: Diagnosis not present

## 2019-03-14 DIAGNOSIS — Z79891 Long term (current) use of opiate analgesic: Secondary | ICD-10-CM | POA: Diagnosis not present

## 2019-03-14 DIAGNOSIS — M47816 Spondylosis without myelopathy or radiculopathy, lumbar region: Secondary | ICD-10-CM | POA: Diagnosis not present

## 2019-03-14 DIAGNOSIS — M25559 Pain in unspecified hip: Secondary | ICD-10-CM | POA: Diagnosis not present

## 2019-03-14 DIAGNOSIS — G894 Chronic pain syndrome: Secondary | ICD-10-CM | POA: Diagnosis not present

## 2019-03-15 LAB — NOVEL CORONAVIRUS, NAA: SARS-CoV-2, NAA: NOT DETECTED

## 2019-03-17 ENCOUNTER — Other Ambulatory Visit: Payer: Self-pay | Admitting: Pain Medicine

## 2019-03-17 DIAGNOSIS — M47816 Spondylosis without myelopathy or radiculopathy, lumbar region: Secondary | ICD-10-CM

## 2019-03-18 ENCOUNTER — Other Ambulatory Visit (HOSPITAL_COMMUNITY): Payer: Self-pay | Admitting: Pain Medicine

## 2019-03-18 DIAGNOSIS — M47816 Spondylosis without myelopathy or radiculopathy, lumbar region: Secondary | ICD-10-CM

## 2019-03-19 ENCOUNTER — Other Ambulatory Visit: Payer: Self-pay | Admitting: Internal Medicine

## 2019-03-19 ENCOUNTER — Other Ambulatory Visit: Payer: Self-pay

## 2019-03-19 ENCOUNTER — Other Ambulatory Visit (HOSPITAL_COMMUNITY): Payer: Self-pay | Admitting: Pain Medicine

## 2019-03-19 DIAGNOSIS — M79604 Pain in right leg: Secondary | ICD-10-CM

## 2019-03-19 DIAGNOSIS — I2510 Atherosclerotic heart disease of native coronary artery without angina pectoris: Secondary | ICD-10-CM

## 2019-03-19 DIAGNOSIS — M79605 Pain in left leg: Secondary | ICD-10-CM

## 2019-03-19 DIAGNOSIS — G4700 Insomnia, unspecified: Secondary | ICD-10-CM

## 2019-03-19 DIAGNOSIS — M545 Low back pain, unspecified: Secondary | ICD-10-CM

## 2019-03-19 MED ORDER — ZOLPIDEM TARTRATE 10 MG PO TABS: 10.0000 mg | ORAL_TABLET | Freq: Every evening | ORAL | 5 refills | Status: DC | PRN

## 2019-03-19 MED ORDER — CLOPIDOGREL BISULFATE 75 MG PO TABS: ORAL_TABLET | ORAL | 3 refills | Status: DC

## 2019-03-26 ENCOUNTER — Other Ambulatory Visit: Payer: Self-pay | Admitting: Pain Medicine

## 2019-03-26 DIAGNOSIS — M47816 Spondylosis without myelopathy or radiculopathy, lumbar region: Secondary | ICD-10-CM

## 2019-03-27 ENCOUNTER — Telehealth: Payer: Self-pay | Admitting: Internal Medicine

## 2019-03-27 NOTE — Telephone Encounter (Signed)
Call pt did he request a back brace, knee brace and suspension sleeve from [employer]?   If not do not worry will shred if so will order   [place]

## 2019-03-28 ENCOUNTER — Telehealth: Payer: Self-pay | Admitting: Pain Medicine

## 2019-03-28 ENCOUNTER — Other Ambulatory Visit: Payer: Self-pay | Admitting: Internal Medicine

## 2019-03-28 ENCOUNTER — Telehealth: Payer: Self-pay | Admitting: Internal Medicine

## 2019-03-28 DIAGNOSIS — I5022 Chronic systolic (congestive) heart failure: Secondary | ICD-10-CM

## 2019-03-28 DIAGNOSIS — Z298 Encounter for other specified prophylactic measures: Secondary | ICD-10-CM

## 2019-03-28 NOTE — Telephone Encounter (Signed)
Have pt go to [facility] and get this lab done for CT scan   Call pt

## 2019-03-28 NOTE — Telephone Encounter (Signed)
03/28/19~S/W Florentina Jenny @ [facility], left msg for referring office / Tabitha, to fax order & correction to test requested from [facility] to CT LS WO, (per Dx /Stephanie/ CT) MF

## 2019-03-28 NOTE — Addendum Note (Signed)
Addended by: Orland Mustard on: 03/28/2019 06:08 PM   Modules accepted: Orders

## 2019-03-28 NOTE — Telephone Encounter (Signed)
Left message for Martin Hernandez to return call back. She just needs to know Dr Dannial Monarch message.

## 2019-03-28 NOTE — Telephone Encounter (Signed)
Call [facility] is creatinine from 01/10/2019 soon enough?   Labs below as of 01/10/2019  If they are ordering CT can they order stat creatinine?   He can have at [place] if they order it     Ref. Range 01/10/2019 09:45  Creatinine Latest Ref Range: 0.76 - 1.27 mg/dL 1.06  Calcium Latest Ref Range: 8.6 - 10.2 mg/dL 9.5  BUN/Creatinine Ratio Latest Ref Range: 10 - 24  18  Alkaline Phosphatase Latest Ref Range: 39 - 117 IU/L 54  Albumin Latest Ref Range: 3.8 - 4.8 g/dL 4.8  Albumin/Globulin Ratio Latest Ref Range: 1.2 - 2.2  2.3 (H)  AST Latest Ref Range: 0 - 40 IU/L 24  ALT Latest Ref Range: 0 - 44 IU/L 18  Total Protein Latest Ref Range: 6.0 - 8.5 g/dL 6.9  Total Bilirubin Latest Ref Range: 0.0 - 1.2 mg/dL <0.2  GFR, Est Non African American Latest Ref Range: >59 mL/min/1.73 74  GFR, Est African American Latest Ref Range: >59 mL/min/1.73 85

## 2019-03-28 NOTE — Telephone Encounter (Signed)
Jayleen called from [facility] [place] [facility] regarding pt needing a BUN creatin before CT can be done. Order needed. Please advise and Thank you!

## 2019-03-28 NOTE — Telephone Encounter (Signed)
Error   TMS 

## 2019-03-31 ENCOUNTER — Telehealth: Payer: Self-pay | Admitting: Internal Medicine

## 2019-03-31 NOTE — Telephone Encounter (Signed)
Patient has been notified. Patient will not be going to [facility]. Family and patient would like the referrals sent to [place]. They do not want to go to [place].

## 2019-03-31 NOTE — Telephone Encounter (Signed)
[facility] called stating that they have scheduled a CT for him and they need Dr. Olivia Mackie to order creatnine and BUN to be done at [facility] the day of his appt, which is 12/23.

## 2019-03-31 NOTE — Telephone Encounter (Signed)
Fransisco Beau he can go to any [facility] and get the kidney labs done for his CT If he wants his CT at [facility] he needs to discuss with the pain management clinic who ordered it  Have him call them inform him of all above  [place]

## 2019-03-31 NOTE — Telephone Encounter (Signed)
I have done this with [facility] and he is to now today for this so the results will be back in time for appt 04/02/19  Otherwise he will have to go to [facility] to have this done   Inform pt

## 2019-03-31 NOTE — Telephone Encounter (Signed)
I am not thinking about any referrals for him for now  But noted going forward   [place]

## 2019-03-31 NOTE — Telephone Encounter (Signed)
[facility]

## 2019-03-31 NOTE — Telephone Encounter (Signed)
He is going to [facility]. After these scans they would, like all referrals to be with [facility].

## 2019-03-31 NOTE — Telephone Encounter (Signed)
Patient has been informed.

## 2019-04-01 ENCOUNTER — Telehealth: Payer: Self-pay | Admitting: Pain Medicine

## 2019-04-01 NOTE — Telephone Encounter (Signed)
04/01/19~Appt cancel per NO ORDER rcvd. Patient advised. MF  03/31/19~s/w Judeen Hammans / ref office, per Order misiing & Order change. MF

## 2019-04-02 ENCOUNTER — Other Ambulatory Visit: Payer: Self-pay | Admitting: Internal Medicine

## 2019-04-02 ENCOUNTER — Ambulatory Visit: Payer: Medicare HMO

## 2019-04-02 DIAGNOSIS — M47816 Spondylosis without myelopathy or radiculopathy, lumbar region: Secondary | ICD-10-CM

## 2019-04-08 ENCOUNTER — Other Ambulatory Visit: Payer: Self-pay | Admitting: Internal Medicine

## 2019-04-14 DIAGNOSIS — I509 Heart failure, unspecified: Secondary | ICD-10-CM | POA: Diagnosis not present

## 2019-04-14 DIAGNOSIS — M25551 Pain in right hip: Secondary | ICD-10-CM | POA: Diagnosis not present

## 2019-04-14 DIAGNOSIS — G894 Chronic pain syndrome: Secondary | ICD-10-CM | POA: Diagnosis not present

## 2019-04-14 DIAGNOSIS — Z79899 Other long term (current) drug therapy: Secondary | ICD-10-CM | POA: Diagnosis not present

## 2019-04-14 DIAGNOSIS — Z79891 Long term (current) use of opiate analgesic: Secondary | ICD-10-CM | POA: Diagnosis not present

## 2019-04-14 DIAGNOSIS — Z95810 Presence of automatic (implantable) cardiac defibrillator: Secondary | ICD-10-CM | POA: Diagnosis not present

## 2019-04-14 DIAGNOSIS — M792 Neuralgia and neuritis, unspecified: Secondary | ICD-10-CM | POA: Diagnosis not present

## 2019-04-14 DIAGNOSIS — M47816 Spondylosis without myelopathy or radiculopathy, lumbar region: Secondary | ICD-10-CM | POA: Diagnosis not present

## 2019-04-14 DIAGNOSIS — Z298 Encounter for other specified prophylactic measures: Secondary | ICD-10-CM | POA: Diagnosis not present

## 2019-04-14 LAB — BASIC METABOLIC PANEL
BUN/Creatinine Ratio: 23 (ref 10–24)
BUN: 20 mg/dL (ref 8–27)
CO2: 26 mmol/L (ref 20–29)
Calcium: 9.2 mg/dL (ref 8.6–10.2)
Chloride: 108 mmol/L — ABNORMAL HIGH (ref 96–106)
Creatinine, Ser: 0.87 mg/dL (ref 0.76–1.27)
GFR calc Af Amer: 105 mL/min/{1.73_m2} (ref >59)
GFR calc non Af Amer: 91 mL/min/{1.73_m2} (ref >59)
Glucose: 77 mg/dL (ref 65–99)
Potassium: 4.3 mmol/L (ref 3.5–5.2)
Sodium: 145 mmol/L — ABNORMAL HIGH (ref 134–144)

## 2019-04-18 ENCOUNTER — Telehealth: Payer: Self-pay | Admitting: Internal Medicine

## 2019-04-18 NOTE — Telephone Encounter (Signed)
Molli Hazard [phone] called from [employer] regarding a fax for detailed order for knee brace and suspension sleeve. It was fax today. Please advise and thank you!

## 2019-04-18 NOTE — Telephone Encounter (Signed)
This was not ordered by the patient we called him and stated this was not ordered by him call MATT

## 2019-04-21 NOTE — Telephone Encounter (Signed)
I spoke with Leonette Most @ [facility] & told him that we would not be faxing anything back & that the patient did not approve this.

## 2019-04-22 ENCOUNTER — Ambulatory Visit (INDEPENDENT_AMBULATORY_CARE_PROVIDER_SITE_OTHER): Payer: Medicare Other

## 2019-04-22 ENCOUNTER — Other Ambulatory Visit: Payer: Self-pay

## 2019-04-22 VITALS — Ht 69.0 in | Wt 130.0 lb

## 2019-04-22 DIAGNOSIS — Z0000 Encounter for general adult medical examination without abnormal findings: Secondary | ICD-10-CM

## 2019-04-22 NOTE — Patient Instructions (Addendum)
  Martin Hernandez , Thank you for taking time to come for your Medicare Wellness Visit. I appreciate your ongoing commitment to your health goals. Please review the following plan we discussed and let me know if I can assist you in the future.   These are the goals we discussed: Goals    . Follow up with Primary Care Provider     As needed       This is a list of the screening recommended for you and due dates:  Health Maintenance  Topic Date Due  . Flu Shot  07/09/2019*  . Tetanus Vaccine  04/21/2020*  . HIV Screening  04/21/2020*  . Colon Cancer Screening  07/25/2027  .  Hepatitis C: One time screening is recommended by Center for Disease Control  (CDC) for  adults born from [birth year] through 1966.   Completed  *Topic was postponed. The date shown is not the original due date.

## 2019-04-22 NOTE — Progress Notes (Signed)
Subjective:   Martin Hernandez Martin Hernandez. is a 64 y.o. male who presents for Medicare Annual/Subsequent preventive examination.  Review of Systems:  No ROS.  Medicare Wellness Virtual Visit.  Visual/audio telehealth visit, UTA vital signs.   Wt/Ht provided. See social history for additional risk factors.  Cardiac Risk Factors include: advanced age (>50men, >40 women);male gender;hypertension     Objective:    Vitals: Ht 5\' 9"  (1.753 m)   Wt 130 lb (59 kg)   BMI 19.20 kg/m   Body mass index is 19.2 kg/m.  Advanced Directives 04/22/2019 04/18/2018 04/18/2018 11/05/2017 07/24/2017 11/02/2016 11/03/2015  Does Patient Have a Medical Advance Directive? No No No No Yes No No  Type of Advance Directive - - - - Press photographer;Living will - -  Does patient want to make changes to medical advance directive? - - - - - - -  Copy of [place] in Chart? - - - - No - copy requested - -  Would patient like information on creating a medical advance directive? Yes (MAU/Ambulatory/Procedural Areas - Information given) No - Patient declined No - Patient declined Yes (MAU/Ambulatory/Procedural Areas - Information given) - Yes (MAU/Ambulatory/Procedural Areas - Information given) No - patient declined information    Tobacco Social History   Tobacco Use  Smoking Status Current Every Day Smoker  . Packs/day: 0.25  . Types: Cigarettes  Smokeless Tobacco Never Used     Ready to quit: Not Answered Counseling given: Not Answered   Clinical Intake:  Pre-visit preparation completed: Yes        Diabetes: No  How often do you need to have someone help you when you read instructions, pamphlets, or other written materials from your doctor or pharmacy?: 1 - Never  Interpreter Needed?: No     Past Medical History:  Diagnosis Date  . Allergy   . CAD (coronary artery disease)   . Depression   . Frequent headaches   . GERD (gastroesophageal reflux disease)   .  Headache   . Hyperlipidemia   . Hypertension   . PAD (peripheral artery disease) ([place])   . Tobacco abuse    Past Surgical History:  Procedure Laterality Date  . COLONOSCOPY WITH PROPOFOL N/A 06/20/2017   Procedure: COLONOSCOPY WITH PROPOFOL;  Surgeon: Jonathon Bellows, MD;  Location: [facility] ENDOSCOPY;  Service: Gastroenterology;  Laterality: N/A;  . COLONOSCOPY WITH PROPOFOL N/A 07/24/2017   Procedure: COLONOSCOPY WITH PROPOFOL;  Surgeon: Jonathon Bellows, MD;  Location: [facility] ENDOSCOPY;  Service: Gastroenterology;  Laterality: N/A;  . ESOPHAGOGASTRODUODENOSCOPY (EGD) WITH PROPOFOL N/A 06/20/2017   Procedure: ESOPHAGOGASTRODUODENOSCOPY (EGD) WITH PROPOFOL;  Surgeon: Jonathon Bellows, MD;  Location: [facility] ENDOSCOPY;  Service: Gastroenterology;  Laterality: N/A;  . PERCUTANEOUS CORONARY STENT INTERVENTION (PCI-S)    . STOMACH SURGERY     2009 [facility] per chart review pt had AAA repair   . TONSILLECTOMY     Family History  Problem Relation Age of Onset  . Diabetes Mother   . Heart disease Mother   . Hypertension Mother   . Stroke Mother   . Prostate cancer Father   . Cancer Father        prostate cancer in 9s  . Diabetes Sister   . Heart disease Sister   . Ulcers Other        unknown who had ulcers in family    Social History   Socioeconomic History  . Marital status: Married    Spouse name: Not on  file  . Number of children: Not on file  . Years of education: Not on file  . Highest education level: Not on file  Occupational History  . Not on file  Tobacco Use  . Smoking status: Current Every Day Smoker    Packs/day: 0.25    Types: Cigarettes  . Smokeless tobacco: Never Used  Substance and Sexual Activity  . Alcohol use: No    Alcohol/week: 0.0 standard drinks    Comment: quit 1 year ago  . Drug use: No  . Sexual activity: Not Currently  Other Topics Concern  . Not on file  Social History Narrative   Disabled    4 kids 2 living    Used to do mill work    Smoker since age 30 max 2 pk  per week now 1 ppd as of 02/2017    Married    Social Determinants of Corporate investment banker Strain:   . Difficulty of Paying Living Expenses: Not on file  Food Insecurity:   . Worried About Programme researcher, broadcasting/film/video in the Last Year: Not on file  . Ran Out of Food in the Last Year: Not on file  Transportation Needs:   . Lack of Transportation (Medical): Not on file  . Lack of Transportation (Non-Medical): Not on file  Physical Activity:   . Days of Exercise per Week: Not on file  . Minutes of Exercise per Session: Not on file  Stress:   . Feeling of Stress : Not on file  Social Connections:   . Frequency of Communication with Friends and Family: Not on file  . Frequency of Social Gatherings with Friends and Family: Not on file  . Attends Religious Services: Not on file  . Active Member of Clubs or Organizations: Not on file  . Attends Banker Meetings: Not on file  . Marital Status: Not on file    Outpatient Encounter Medications as of 04/22/2019  Medication Sig  . amLODipine (NORVASC) 2.5 MG tablet Take 2.5 mg by mouth daily.   Marland Kitchen aspirin 81 MG tablet Take 81 mg by mouth daily.  Marland Kitchen atorvastatin (LIPITOR) 80 MG tablet Take 1 tablet (80 mg total) by mouth daily at 6 PM.  . Cholecalciferol 1.25 MG (50000 UT) capsule Take 1 capsule (50,000 Units total) by mouth once a week.  . clopidogrel (PLAVIX) 75 MG tablet 1 pill daily  . fluticasone (FLONASE) 50 MCG/ACT nasal spray Place 1-2 sprays into both nostrils daily.  Marland Kitchen gabapentin (NEURONTIN) 300 MG capsule Take 1 capsule (300 mg total) by mouth 2 (two) times daily.  Marland Kitchen ipratropium (ATROVENT) 0.02 % nebulizer solution Inhale into the lungs.  . isosorbide mononitrate (IMDUR) 30 MG 24 hr tablet Take 60 mg by mouth 2 (two) times daily.   Marland Kitchen lisinopril (ZESTRIL) 5 MG tablet 1 pill daily  . metoprolol succinate (TOPROL-XL) 25 MG 24 hr tablet Take 1 tablet (25 mg total) by mouth daily.  . mirtazapine (REMERON) 30 MG tablet 1 pill  nightly  . nitroGLYCERIN (NITROSTAT) 0.4 MG SL tablet Place 1 tablet (0.4 mg total) under the tongue every 5 (five) minutes as needed for chest pain.  Marland Kitchen ondansetron (ZOFRAN) 4 MG tablet Take 1 tablet (4 mg total) by mouth 2 (two) times daily as needed for nausea or vomiting.  Marland Kitchen oxyCODONE-acetaminophen (PERCOCET) 10-325 MG tablet   . pantoprazole (PROTONIX) 40 MG tablet Take 1 tablet (40 mg total) by mouth daily. 30 minutes before food  .  pyridOXINE (VITAMIN B-6) 100 MG tablet Take 1 tablet (100 mg total) by mouth daily.  . sildenafil (REVATIO) 20 MG tablet 1-5 pills as needed before sex 30 minutes to 4 hours  . tamsulosin (FLOMAX) 0.4 MG CAPS capsule Take 1 capsule (0.4 mg total) by mouth daily after supper.  . zolpidem (AMBIEN) 10 MG tablet Take 1 tablet (10 mg total) by mouth at bedtime as needed for sleep.   No facility-administered encounter medications on file as of 04/22/2019.    Activities of Daily Living In your present state of health, do you have any difficulty performing the following activities: 04/22/2019  Hearing? N  Vision? N  Difficulty concentrating or making decisions? N  Walking or climbing stairs? N  Dressing or bathing? N  Doing errands, shopping? N  Preparing Food and eating ? N  Using the Toilet? N  In the past six months, have you accidently leaked urine? N  Do you have problems with loss of bowel control? N  Managing your Medications? N  Managing your Finances? N  Housekeeping or managing your Housekeeping? N  Some recent data might be hidden    Patient Care Team: McLean-Scocuzza, Pasty Spillers, MD as PCP - General (Internal Medicine)   Assessment:   This is a routine wellness examination for Xzayvier.  Nurse connected with patient 04/22/19 at  9:30 AM EST by a telephone enabled telemedicine application and verified that I am speaking with the correct person using two identifiers. Patient stated full name and DOB. Patient gave permission to continue with virtual  visit. Patient's location was at home and Nurse's location was at [place] office.   Patient is alert and oriented x3. Patient denies difficulty focusing or concentrating.  Health Maintenance Due: -Tdap vaccine- discussed; to be completed with doctor in visit or local pharmacy.   -HIV lab- followed by pcp; deferred.  See completed HM at the end of note.   Eye: Visual acuity not assessed. Virtual visit. Followed by their ophthalmologist. Appointment scheduled in 2 weeks.   Dental: UTD.    Hearing: Demonstrates normal hearing during visit.  Safety:  Patient feels safe at home- yes Patient does have smoke detectors at home- yes Patient does wear sunscreen or protective clothing when in direct sunlight - yes Patient does wear seat belt when in a moving vehicle - yes Patient drives- yes Adequate lighting in walkways free from debris- yes Grab bars and handrails used as appropriate- yes Ambulates with an assistive device- no; plans to keep a cane close to use as needed.  Cell phone on person when ambulating outside of the home- yes  Social: Alcohol intake - no     Smoking history- current Smokers in home? none Illicit drug use? none  Medication: Taking as directed and without issues.  Self managed with pill pack- yes   Covid-19: Precautions and sickness symptoms discussed. Wears mask, social distancing, hand hygiene as appropriate.   Activities of Daily Living Patient denies needing assistance with: household chores, feeding themselves, getting from bed to chair, getting to the toilet, bathing/showering, dressing, managing money, or preparing meals.   Discussed the importance of a healthy diet, water intake and the benefits of aerobic exercise.   Physical activity- no routine. Encouraged to stay active.   Diet:  Regular; appetite good Water: 4 cups  Caffeine: 2 cups  Other Providers Patient Care Team: McLean-Scocuzza, Pasty Spillers, MD as PCP - General (Internal  Medicine)  Exercise Activities and Dietary recommendations Current Exercise Habits: The  patient does not participate in regular exercise at present  Goals    . Follow up with Primary Care Provider     As needed       Fall Risk Fall Risk  04/22/2019 02/26/2019 01/09/2019 11/05/2017 11/02/2016  Falls in the past year? 0 0 0 No Yes  Number falls in past yr: 0 0 - - 2 or more  Injury with Fall? - - - - Yes  Risk Factor Category  - - - - High Fall Risk  Risk for fall due to : - - - - -  Follow up Falls prevention discussed Falls evaluation completed - - Falls prevention discussed;Education provided   Timed Get Up and Go Performed: no, virtual visit  Depression Screen PHQ 2/9 Scores 04/22/2019 02/26/2019 11/05/2017 11/02/2016  PHQ - 2 Score 0 0 0 0  PHQ- 9 Score - - - 0    Cognitive Function MMSE - Mini Mental State Exam 11/03/2015  Orientation to time 5  Orientation to Place 5  Registration 3  Attention/ Calculation 5  Recall 3  Language- name 2 objects 2  Language- repeat 1  Language- follow 3 step command 3  Language- read & follow direction 1  Write a sentence 1  Copy design 1  Total score 30     6CIT Screen 04/22/2019 11/05/2017 11/02/2016  What Year? 0 points 0 points 0 points  What month? 0 points 0 points 0 points  What time? 0 points 0 points 0 points  Count back from 20 0 points 0 points 0 points  Months in reverse 4 points 2 points 4 points  Repeat phrase - 0 points 0 points  Total Score - 2 4    Immunization History  Administered Date(s) Administered  . Hepatitis B, adult 03/08/2017, 05/22/2017, 05/30/2018  . Influenza-Unspecified 04/19/2012   Screening Tests Health Maintenance  Topic Date Due  . INFLUENZA VACCINE  07/09/2019 (Originally 11/09/2018)  . TETANUS/TDAP  04/21/2020 (Originally 01/02/1974)  . HIV Screening  04/21/2020 (Originally [birth year]02/1970)  . COLONOSCOPY  07/25/2027  . Hepatitis C Screening  Completed       Plan:   Keep all routine  maintenance appointments.   Follow up 06/27/19 @ 8:00.  Medicare Attestation I have personally reviewed: The patient's medical and social history Their use of alcohol, tobacco or illicit drugs Their current medications and supplements The patient's functional ability including ADLs,fall risks, home safety risks, cognitive, and hearing and visual impairment Diet and physical activities Evidence for depression   I have reviewed and discussed with patient certain preventive protocols, quality metrics, and best practice recommendations.     Ashok Pall, LPN  08/13/[id number]

## 2019-04-24 ENCOUNTER — Telehealth: Payer: Self-pay | Admitting: Pain Medicine

## 2019-04-24 ENCOUNTER — Other Ambulatory Visit: Payer: Self-pay | Admitting: Pain Medicine

## 2019-04-24 DIAGNOSIS — M545 Low back pain, unspecified: Secondary | ICD-10-CM

## 2019-04-24 DIAGNOSIS — M79606 Pain in leg, unspecified: Secondary | ICD-10-CM

## 2019-04-24 NOTE — Telephone Encounter (Signed)
Called office got no answer. Got V/M. LM w request for new order for CT Lumbar Spine WO Contrast per Dx Lumbar Spondylosis. Left tel # and fax #. Office contacted originally on 03/28/19 and 4 times after. MF

## 2019-04-25 ENCOUNTER — Ambulatory Visit
Admission: RE | Admit: 2019-04-25 | Discharge: 2019-04-25 | Disposition: A | Discharge status: Home / Self Care | Payer: Medicare Other | Source: Ambulatory Visit | Attending: Pain Medicine | Admitting: Pain Medicine

## 2019-04-25 ENCOUNTER — Other Ambulatory Visit: Payer: Self-pay

## 2019-04-25 ENCOUNTER — Ambulatory Visit: Payer: Medicare Other

## 2019-04-25 DIAGNOSIS — M79606 Pain in leg, unspecified: Secondary | ICD-10-CM

## 2019-04-25 DIAGNOSIS — M545 Low back pain, unspecified: Secondary | ICD-10-CM

## 2019-04-29 NOTE — Telephone Encounter (Signed)
Taken care of

## 2019-05-19 DIAGNOSIS — M5136 Other intervertebral disc degeneration, lumbar region: Secondary | ICD-10-CM | POA: Diagnosis not present

## 2019-06-16 DIAGNOSIS — I25118 Atherosclerotic heart disease of native coronary artery with other forms of angina pectoris: Secondary | ICD-10-CM | POA: Diagnosis not present

## 2019-06-16 DIAGNOSIS — Z79899 Other long term (current) drug therapy: Secondary | ICD-10-CM | POA: Diagnosis not present

## 2019-06-16 DIAGNOSIS — Z79891 Long term (current) use of opiate analgesic: Secondary | ICD-10-CM | POA: Diagnosis not present

## 2019-06-16 DIAGNOSIS — I714 Abdominal aortic aneurysm, without rupture: Secondary | ICD-10-CM | POA: Diagnosis not present

## 2019-06-16 DIAGNOSIS — I255 Ischemic cardiomyopathy: Secondary | ICD-10-CM | POA: Diagnosis not present

## 2019-06-16 DIAGNOSIS — M25559 Pain in unspecified hip: Secondary | ICD-10-CM | POA: Diagnosis not present

## 2019-06-16 DIAGNOSIS — M792 Neuralgia and neuritis, unspecified: Secondary | ICD-10-CM | POA: Diagnosis not present

## 2019-06-16 DIAGNOSIS — G894 Chronic pain syndrome: Secondary | ICD-10-CM | POA: Diagnosis not present

## 2019-06-16 DIAGNOSIS — I25119 Atherosclerotic heart disease of native coronary artery with unspecified angina pectoris: Secondary | ICD-10-CM | POA: Diagnosis not present

## 2019-06-16 DIAGNOSIS — M47816 Spondylosis without myelopathy or radiculopathy, lumbar region: Secondary | ICD-10-CM | POA: Diagnosis not present

## 2019-06-17 ENCOUNTER — Other Ambulatory Visit: Payer: Self-pay | Admitting: Internal Medicine

## 2019-06-17 DIAGNOSIS — E531 Pyridoxine deficiency: Secondary | ICD-10-CM

## 2019-06-17 MED ORDER — VITAMIN B-6 100 MG PO TABS: 100.0000 mg | ORAL_TABLET | Freq: Every day | ORAL | 3 refills | Status: DC

## 2019-06-25 ENCOUNTER — Other Ambulatory Visit: Payer: Self-pay

## 2019-06-27 ENCOUNTER — Other Ambulatory Visit: Payer: Self-pay

## 2019-06-27 ENCOUNTER — Ambulatory Visit (INDEPENDENT_AMBULATORY_CARE_PROVIDER_SITE_OTHER): Payer: Medicare Other | Admitting: Internal Medicine

## 2019-06-27 ENCOUNTER — Encounter: Payer: Self-pay | Admitting: Internal Medicine

## 2019-06-27 ENCOUNTER — Telehealth: Payer: Self-pay | Admitting: Internal Medicine

## 2019-06-27 VITALS — BP 118/70 | HR 73 | Temp 97.2°F | Ht 69.0 in | Wt 134.2 lb

## 2019-06-27 DIAGNOSIS — I2510 Atherosclerotic heart disease of native coronary artery without angina pectoris: Secondary | ICD-10-CM

## 2019-06-27 DIAGNOSIS — M25511 Pain in right shoulder: Secondary | ICD-10-CM

## 2019-06-27 DIAGNOSIS — E559 Vitamin D deficiency, unspecified: Secondary | ICD-10-CM

## 2019-06-27 DIAGNOSIS — M5440 Lumbago with sciatica, unspecified side: Secondary | ICD-10-CM | POA: Diagnosis not present

## 2019-06-27 DIAGNOSIS — R39198 Other difficulties with micturition: Secondary | ICD-10-CM | POA: Diagnosis not present

## 2019-06-27 DIAGNOSIS — Z0184 Encounter for antibody response examination: Secondary | ICD-10-CM

## 2019-06-27 DIAGNOSIS — N200 Calculus of kidney: Secondary | ICD-10-CM | POA: Diagnosis not present

## 2019-06-27 DIAGNOSIS — Z1159 Encounter for screening for other viral diseases: Secondary | ICD-10-CM

## 2019-06-27 DIAGNOSIS — M25512 Pain in left shoulder: Secondary | ICD-10-CM

## 2019-06-27 DIAGNOSIS — G8929 Other chronic pain: Secondary | ICD-10-CM

## 2019-06-27 DIAGNOSIS — I10 Essential (primary) hypertension: Secondary | ICD-10-CM

## 2019-06-27 MED ORDER — TAMSULOSIN HCL 0.4 MG PO CAPS: 0.8000 mg | ORAL_CAPSULE | Freq: Every day | ORAL | 3 refills | Status: DC

## 2019-06-27 MED ORDER — BACLOFEN 5 MG PO TABS: 1.0000 | ORAL_TABLET | Freq: Two times a day (BID) | ORAL | 3 refills | Status: DC

## 2019-06-27 NOTE — Patient Instructions (Addendum)
Isordil 40 mg 3x per day  [facility]  [address]  [place], [place] [address]  [phone]  Jaclynn Major, MD  [address] [facility]  [place], [place] [address]  [phone]  [phone] (Fax)      Please finish vitamin D3 pills 1x per week  Stop and take vitamin D3 4000 IU over the counter    Dietary Guidelines to Help Prevent Kidney Stones Kidney stones are deposits of minerals and salts that form inside your kidneys. Your risk of developing kidney stones may be greater depending on your diet, your lifestyle, the medicines you take, and whether you have certain medical conditions. Most people can reduce their chances of developing kidney stones by following the instructions below. Depending on your overall health and the type of kidney stones you tend to develop, your dietitian may give you more specific instructions. What are tips for following this plan? Reading food labels  Choose foods with "no salt added" or "low-salt" labels. Limit your sodium intake to less than 1500 mg per day.  Choose foods with calcium for each meal and snack. Try to eat about 300 mg of calcium at each meal. Foods that contain 200-500 mg of calcium per serving include: ? 8 oz (237 ml) of milk, fortified nondairy milk, and fortified fruit juice. ? 8 oz (237 ml) of kefir, yogurt, and soy yogurt. ? 4 oz (118 ml) of tofu. ? 1 oz of cheese. ? 1 cup (300 g) of dried figs. ? 1 cup (91 g) of cooked broccoli. ? 1-3 oz can of sardines or mackerel.  Most people need 1000 to 1500 mg of calcium each day. Talk to your dietitian about how much calcium is recommended for you. Shopping  Buy plenty of fresh fruits and vegetables. Most people do not need to avoid fruits and vegetables, even if they contain nutrients that may contribute to kidney stones.  When shopping for convenience foods, choose: ? Whole pieces of fruit. ? Premade salads with dressing on the side. ? Low-fat fruit and  yogurt smoothies.  Avoid buying frozen meals or prepared deli foods.  Look for foods with live cultures, such as yogurt and kefir. Cooking  Do not add salt to food when cooking. Place a salt shaker on the table and allow each person to add his or her own salt to taste.  Use vegetable protein, such as beans, textured vegetable protein (TVP), or tofu instead of meat in pasta, casseroles, and soups. Meal planning   Eat less salt, if told by your dietitian. To do this: ? Avoid eating processed or premade food. ? Avoid eating fast food.  Eat less animal protein, including cheese, meat, poultry, or fish, if told by your dietitian. To do this: ? Limit the number of times you have meat, poultry, fish, or cheese each week. Eat a diet free of meat at least 2 days a week. ? Eat only one serving each day of meat, poultry, fish, or seafood. ? When you prepare animal protein, cut pieces into small portion sizes. For most meat and fish, one serving is about the size of one deck of cards.  Eat at least 5 servings of fresh fruits and vegetables each day. To do this: ? Keep fruits and vegetables on hand for snacks. ? Eat 1 piece of fruit or a handful of berries with breakfast. ? Have a salad and fruit at lunch. ? Have two kinds of vegetables at dinner.  Limit foods that are  high in a substance called oxalate. These include: ? Spinach. ? Rhubarb. ? Beets. ? Potato chips and french fries. ? Nuts.  If you regularly take a diuretic medicine, make sure to eat at least 1-2 fruits or vegetables high in potassium each day. These include: ? Avocado. ? Banana. ? Orange, prune, carrot, or tomato juice. ? Baked potato. ? Cabbage. ? Beans and split peas. General instructions   Drink enough fluid to keep your urine clear or pale yellow. This is the most important thing you can do.  Talk to your health care provider and dietitian about taking daily supplements. Depending on your health and the cause of  your kidney stones, you may be advised: ? Not to take supplements with vitamin C. ? To take a calcium supplement. ? To take a daily probiotic supplement. ? To take other supplements such as magnesium, fish oil, or vitamin B6.  Take all medicines and supplements as told by your health care provider.  Limit alcohol intake to no more than 1 drink a day for nonpregnant women and 2 drinks a day for men. One drink equals 12 oz of beer, 5 oz of wine, or 1 oz of hard liquor.  Lose weight if told by your health care provider. Work with your dietitian to find strategies and an eating plan that works best for you. What foods are not recommended? Limit your intake of the following foods, or as told by your dietitian. Talk to your dietitian about specific foods you should avoid based on the type of kidney stones and your overall health. Grains Breads. Bagels. Rolls. Baked goods. Salted crackers. Cereal. Pasta. Vegetables Spinach. Rhubarb. Beets. Canned vegetables. Rosita Fire. Olives. Meats and other protein foods Nuts. Nut butters. Large portions of meat, poultry, or fish. Salted or cured meats. Deli meats. Hot dogs. Sausages. Dairy Cheese. Beverages Regular soft drinks. Regular vegetable juice. Seasonings and other foods Seasoning blends with salt. Salad dressings. Canned soups. Soy sauce. Ketchup. Barbecue sauce. Canned pasta sauce. Casseroles. Pizza. Lasagna. Frozen meals. Potato chips. [place] fries. Summary  You can reduce your risk of kidney stones by making changes to your diet.  The most important thing you can do is drink enough fluid. You should drink enough fluid to keep your urine clear or pale yellow.  Ask your health care provider or dietitian how much protein from animal sources you should eat each day, and also how much salt and calcium you should have each day. This information is not intended to replace advice given to you by your health care provider. Make sure you discuss any  questions you have with your health care provider. Document Revised: 07/17/2018 Document Reviewed: 03/07/2016 Elsevier Patient Education  2020 Elsevier Inc.  Kidney Stones  Kidney stones are solid, rock-like deposits that form inside of the kidneys. The kidneys are a pair of organs that make urine. A kidney stone may form in a kidney and move into other parts of the urinary tract, including the tubes that connect the kidneys to the bladder (ureters), the bladder, and the tube that carries urine out of the body (urethra). As the stone moves through these areas, it can cause intense pain and block the flow of urine. Kidney stones are created when high levels of certain minerals are found in the urine. The stones are usually passed out of the body through urination, but in some cases, medical treatment may be needed to remove them. What are the causes? Kidney stones may be caused by:  A condition in which certain glands produce too much parathyroid hormone (primary hyperparathyroidism), which causes too much calcium buildup in the blood.  A buildup of uric acid crystals in the bladder (hyperuricosuria). Uric acid is a chemical that the body produces when you eat certain foods. It usually exits the body in the urine.  Narrowing (stricture) of one or both of the ureters.  A kidney blockage that is present at birth (congenital obstruction).  Past surgery on the kidney or the ureters, such as gastric bypass surgery. What increases the risk? The following factors may make you more likely to develop this condition:  Having had a kidney stone in the past.  Having a family history of kidney stones.  Not drinking enough water.  Eating a diet that is high in protein, salt (sodium), or sugar.  Being overweight or obese. What are the signs or symptoms? Symptoms of a kidney stone may include:  Pain in the side of the abdomen, right below the ribs (flank pain). Pain usually spreads (radiates) to  the groin.  Needing to urinate frequently or urgently.  Painful urination.  Blood in the urine (hematuria).  Nausea.  Vomiting.  Fever and chills. How is this diagnosed? This condition may be diagnosed based on:  Your symptoms and medical history.  A physical exam.  Blood tests.  Urine tests. These may be done before and after the stone passes out of your body through urination.  Imaging tests, such as a CT scan, abdominal X-ray, or ultrasound.  A procedure to examine the inside of the bladder (cystoscopy). How is this treated? Treatment for kidney stones depends on the size, location, and makeup of the stones. Kidney stones will often pass out of the body through urination. You may need to:  Increase your fluid intake to help pass the stone. In some cases, you may be given fluids through an IV and may need to be monitored at the hospital.  Take medicine for pain.  Make changes in your diet to help prevent kidney stones from coming back. Sometimes, medical procedures are needed to remove a kidney stone. This may involve:  A procedure to break up kidney stones using: ? A focused beam of light (laser therapy). ? Shock waves (extracorporeal shock wave lithotripsy).  Surgery to remove kidney stones. This may be needed if you have severe pain or have stones that block your urinary tract. Follow these instructions at home: Medicines  Take over-the-counter and prescription medicines only as told by your health care provider.  Ask your health care provider if the medicine prescribed to you requires you to avoid driving or using heavy machinery. Eating and drinking  Drink enough fluid to keep your urine pale yellow. You may be instructed to drink at least 8-10 glasses of water each day. This will help you pass the kidney stone.  If directed, change your diet. This may include: ? Limiting how much sodium you eat. ? Eating more fruits and vegetables. ? Limiting how much  animal protein-such as red meat, poultry, fish, and eggs-you eat.  Follow instructions from your health care provider about eating or drinking restrictions. General instructions  Collect urine samples as told by your health care provider. You may need to collect a urine sample: ? 24 hours after you pass the stone. ? 8-12 weeks after passing the kidney stone, and every 6-12 months after that.  Strain your urine every time you urinate, for as long as directed. Use the strainer that your  health care provider recommends.  Do not throw out the kidney stone after passing it. Keep the stone so it can be tested by your health care provider. Testing the makeup of your kidney stone may help prevent you from getting kidney stones in the future.  Keep all follow-up visits as told by your health care provider. This is important. You may need follow-up X-rays or ultrasounds to make sure that your stone has passed. How is this prevented? To prevent another kidney stone:  Drink enough fluid to keep your urine pale yellow. This is the best way to prevent kidney stones.  Eat a healthy diet and follow recommendations from your health care provider about foods to avoid. You may be instructed to eat a low-protein diet. Recommendations vary depending on the type of kidney stone that you have.  Maintain a healthy weight. Where to find more information  [place] ([facility]): www.kidney.[facility]): www.urologyhealth.org Contact a health care provider if:  You have pain that gets worse or does not get better with medicine. Get help right away if:  You have a fever or chills.  You develop severe pain.  You develop new abdominal pain.  You faint.  You are unable to urinate. Summary  Kidney stones are solid, rock-like deposits that form inside of the kidneys.  Kidney stones can cause nausea, vomiting, blood in the urine, abdominal pain, and the urge to urinate  frequently.  Treatment for kidney stones depends on the size, location, and makeup of the stones. Kidney stones will often pass out of the body through urination.  Kidney stones can be prevented by drinking enough fluids, eating a healthy diet, and maintaining a healthy weight. This information is not intended to replace advice given to you by your health care provider. Make sure you discuss any questions you have with your health care provider. Document Revised: 08/13/2018 Document Reviewed: 08/13/2018 Elsevier Patient Education  [place].

## 2019-06-27 NOTE — Progress Notes (Signed)
Chief Complaint  Patient presents with  . Follow-up   F/u doing well  1. Joint pain in hands and shoulder b/l nothing tried  2. Kidney stones noted on Ct imaging CT lumbar 1/15/21and has trouble urinating at times urine stream is weak and stops and does not feel like emptying his bladder on flomax 0.4 mg qhs  3. CAD with AICD f/u [facility] he reports he was supposed to be changed from imdur 60 bid to isordil 40 mg tid but Rx was not sent in per cards contacted [facility] cards to do this Dr. Maurine Cane  HTN on norvasc 2.5 mg qd, lis 5 mg qd, Toprol 25 mg xl qd BP controlled today  Also on lipitor 80 mg qd, aspirin 81 mg qd, plavix 75 mg qd, Imdur 60 mg mg bid 4.h/o weight loss Weight is stable gained 4 lbs   Review of Systems  Constitutional: Negative for weight loss.  HENT: Negative for hearing loss.   Eyes: Negative for blurred vision.  Respiratory: Negative for shortness of breath.   Cardiovascular: Negative for chest pain.  Gastrointestinal: Negative for abdominal pain.  Musculoskeletal: Negative for falls.  Psychiatric/Behavioral: Negative for depression.   Past Medical History:  Diagnosis Date  . Allergy   . CAD (coronary artery disease)   . Depression   . Frequent headaches   . GERD (gastroesophageal reflux disease)   . Headache   . Hyperlipidemia   . Hypertension   . PAD (peripheral artery disease) (HCC)   . Tobacco abuse    Past Surgical History:  Procedure Laterality Date  . COLONOSCOPY WITH PROPOFOL N/A 06/20/2017   Procedure: COLONOSCOPY WITH PROPOFOL;  Surgeon: Wyline Mood, MD;  Location: [facility] ENDOSCOPY;  Service: Gastroenterology;  Laterality: N/A;  . COLONOSCOPY WITH PROPOFOL N/A 07/24/2017   Procedure: COLONOSCOPY WITH PROPOFOL;  Surgeon: Wyline Mood, MD;  Location: [facility] ENDOSCOPY;  Service: Gastroenterology;  Laterality: N/A;  . ESOPHAGOGASTRODUODENOSCOPY (EGD) WITH PROPOFOL N/A 06/20/2017   Procedure: ESOPHAGOGASTRODUODENOSCOPY (EGD) WITH PROPOFOL;  Surgeon: Wyline Mood, MD;  Location: [facility] ENDOSCOPY;  Service: Gastroenterology;  Laterality: N/A;  . PERCUTANEOUS CORONARY STENT INTERVENTION (PCI-S)    . STOMACH SURGERY     2009 [facility] per chart review pt had AAA repair   . TONSILLECTOMY     Family History  Problem Relation Age of Onset  . Diabetes Mother   . Heart disease Mother   . Hypertension Mother   . Stroke Mother   . Prostate cancer Father   . Cancer Father        prostate cancer in 21s  . Diabetes Sister   . Heart disease Sister   . Ulcers Other        unknown who had ulcers in family    Social History   Socioeconomic History  . Marital status: Married    Spouse name: Not on file  . Number of children: Not on file  . Years of education: Not on file  . Highest education level: Not on file  Occupational History  . Not on file  Tobacco Use  . Smoking status: Current Every Day Smoker    Packs/day: 0.25    Types: Cigarettes  . Smokeless tobacco: Never Used  Substance and Sexual Activity  . Alcohol use: No    Alcohol/week: 0.0 standard drinks    Comment: quit 1 year ago  . Drug use: No  . Sexual activity: Not Currently  Other Topics Concern  . Not on file  Social History  Narrative   Disabled    4 kids 2 living    Used to do mill work    Smoker since age 33 max 2 pk per week now 1 ppd as of 02/2017    Married    Social Determinants of Corporate investment banker Strain:   . Difficulty of Paying Living Expenses:   Food Insecurity:   . Worried About Programme researcher, broadcasting/film/video in the Last Year:   . Barista in the Last Year:   Transportation Needs:   . Freight forwarder (Medical):   Marland Kitchen Lack of Transportation (Non-Medical):   Physical Activity:   . Days of Exercise per Week:   . Minutes of Exercise per Session:   Stress:   . Feeling of Stress :   Social Connections:   . Frequency of Communication with Friends and Family:   . Frequency of Social Gatherings with Friends and Family:   . Attends Religious Services:    . Active Member of Clubs or Organizations:   . Attends Banker Meetings:   Marland Kitchen Marital Status:   Intimate Partner Violence:   . Fear of Current or Ex-Partner:   . Emotionally Abused:   Marland Kitchen Physically Abused:   . Sexually Abused:    Current Meds  Medication Sig  . amLODipine (NORVASC) 2.5 MG tablet Take 1 tablet by mouth daily.  Marland Kitchen aspirin 81 MG tablet Take 81 mg by mouth daily.  Marland Kitchen atorvastatin (LIPITOR) 80 MG tablet Take 1 tablet (80 mg total) by mouth daily at 6 PM.  . Baclofen 5 MG TABS Take 1 tablet by mouth 2 (two) times daily.  . Cholecalciferol 1.25 MG (50000 UT) capsule Take 1 capsule (50,000 Units total) by mouth once a week.  . clopidogrel (PLAVIX) 75 MG tablet 1 pill daily  . cyanocobalamin 100 MCG tablet Take 1 tablet by mouth daily.  . fluticasone (FLONASE) 50 MCG/ACT nasal spray Place 1-2 sprays into both nostrils daily.  Marland Kitchen gabapentin (NEURONTIN) 300 MG capsule Take 1 capsule (300 mg total) by mouth 2 (two) times daily.  Marland Kitchen ipratropium (ATROVENT) 0.02 % nebulizer solution Inhale into the lungs.  . isosorbide dinitrate (ISORDIL) 40 MG tablet Take 1 tablet by mouth in the morning, at noon, and at bedtime.  Marland Kitchen lisinopril (ZESTRIL) 5 MG tablet 1 pill daily  . metoprolol succinate (TOPROL-XL) 25 MG 24 hr tablet Take 1 tablet (25 mg total) by mouth daily.  . mirtazapine (REMERON) 30 MG tablet 1 pill nightly  . nitroGLYCERIN (NITROSTAT) 0.4 MG SL tablet Place 1 tablet (0.4 mg total) under the tongue every 5 (five) minutes as needed for chest pain.  Marland Kitchen oxyCODONE-acetaminophen (PERCOCET) 10-325 MG tablet   . pantoprazole (PROTONIX) 40 MG tablet Take 1 tablet (40 mg total) by mouth daily. 30 minutes before food  . pyridOXINE (VITAMIN B-6) 100 MG tablet Take 1 tablet (100 mg total) by mouth daily.  . sildenafil (REVATIO) 20 MG tablet 1-5 pills as needed before sex 30 minutes to 4 hours  . tamsulosin (FLOMAX) 0.4 MG CAPS capsule Take 2 capsules (0.8 mg total) by mouth daily  after supper.  . zolpidem (AMBIEN) 10 MG tablet Take 1 tablet (10 mg total) by mouth at bedtime as needed for sleep.  . [DISCONTINUED] Baclofen 5 MG TABS Take 1 tablet by mouth 2 (two) times daily.  . [DISCONTINUED] isosorbide mononitrate (IMDUR) 30 MG 24 hr tablet Take 60 mg by mouth 2 (two) times daily.   . [  DISCONTINUED] tamsulosin (FLOMAX) 0.4 MG CAPS capsule Take 1 capsule (0.4 mg total) by mouth daily after supper.   No Known Allergies Recent Results (from the past 2160 hour(s))  Basic Metabolic Panel (BMET)     Status: Abnormal   Collection Time: 04/14/19  9:14 AM  Result Value Ref Range   Glucose 77 65 - 99 mg/dL   BUN 20 8 - 27 mg/dL   Creatinine, Ser 3.84 0.76 - 1.27 mg/dL   GFR calc non Af Amer 91 >59 mL/min/1.73   GFR calc Af Amer 105 >59 mL/min/1.73   BUN/Creatinine Ratio 23 10 - 24   Sodium 145 (H) 134 - 144 mmol/L   Potassium 4.3 3.5 - 5.2 mmol/L   Chloride 108 (H) 96 - 106 mmol/L   CO2 26 20 - 29 mmol/L   Calcium 9.2 8.6 - 10.2 mg/dL   Objective  Body mass index is 19.82 kg/m. Wt Readings from Last 3 Encounters:  06/27/19 134 lb 3.2 oz (60.9 kg)  04/22/19 130 lb (59 kg)  02/26/19 130 lb 12.8 oz (59.3 kg)   Temp Readings from Last 3 Encounters:  06/27/19 (!) 97.2 F (36.2 C) (Temporal)  02/26/19 (!) 97.2 F (36.2 C) (Temporal)  05/30/18 98.2 F (36.8 C) (Oral)   BP Readings from Last 3 Encounters:  06/27/19 118/70  02/26/19 130/80  05/30/18 134/72   Pulse Readings from Last 3 Encounters:  06/27/19 73  02/26/19 72  05/30/18 78    Physical Exam Vitals and nursing note reviewed.  Constitutional:      Appearance: Normal appearance. He is well-developed and well-groomed.  HENT:     Head: Normocephalic and atraumatic.  Eyes:     Conjunctiva/sclera: Conjunctivae normal.     Pupils: Pupils are equal, round, and reactive to light.  Cardiovascular:     Rate and Rhythm: Normal rate and regular rhythm.     Heart sounds: Normal heart sounds. No murmur.   Pulmonary:     Effort: Pulmonary effort is normal.     Breath sounds: Normal breath sounds.  Abdominal:     General: Abdomen is flat. Bowel sounds are normal.     Tenderness: There is no abdominal tenderness.  Skin:    General: Skin is warm and dry.  Neurological:     General: No focal deficit present.     Mental Status: He is alert and oriented to person, place, and time. Mental status is at baseline.     Gait: Gait normal.  Psychiatric:        Attention and Perception: Attention and perception normal.        Mood and Affect: Mood and affect normal.        Speech: Speech normal.        Behavior: Behavior normal. Behavior is cooperative.        Thought Content: Thought content normal.        Cognition and Memory: Cognition and memory normal.        Judgment: Judgment normal.     Assessment  Plan  Chronic pain of both shoulders - Plan: Ambulatory referral to Orthopedic Surgery Dr. Poggi/Patel   Chronic midline low back pain with sciatica, sciatica laterality unspecified - Plan: Baclofen 5 MG TABS F/u with pain clinic in [place]   Kidney stone - Plan: tamsulosin (FLOMAX) 0.8 MG CAPS capsule increased from 0.4  Consider urology In future pt wants to hold for now   Coronary artery disease involving native coronary artery of native  heart without angina pectoris with AICD  F/u [facility] cards did not get Rx Isordil 40 mg tid to pill pak to stop Imdur 30 mg qd   HM Declines flu shot Had3/3 doses hep B check titerin futureconsider  Will disc Tdap and shingrix in future modernal had 06/18/19 1/2   PSA had11/20/20 2.19 normal DRE in futurecheck PSA upcoming 02/2020 Hep C negative -h/o hematuria CT 08/2017 with left kidney 6 mm stone could be etiologyand CT 04/2019 with b/l nonobstructing kidney stones on CT L spine HIV neg 03/29/15  Colonoscopy 3 and 07/2017 hadpoor prep diverticulosis  -f/u GI 03/05/18 will ask if will order CT liver protocol for liver lesion -of note will  [facility] never had CT ab/pelvis sch 11 or 03/2018 -per pts wife cardiology does not want further procedures for now and especially without their approvalpreviously and as of 02/26/19 no $ to f/u GI   Eye glasses best in [place] cataracts b/l eyes glasses help seen in 2020/2021  Provider: Dr. Olivia Mackie McLean-Scocuzza-Internal Medicine

## 2019-06-27 NOTE — Telephone Encounter (Signed)
-----   Message from Bevelyn Buckles, MD sent at 06/27/2019  8:39 AM EDT ----- [facility] [address] [place], [place] [address] [phone]  Jaclynn Major, MD [address] [place], [place] [address] [phone] (Fax) Send urgent note above Dr. Maurine Cane Isordil 40 mg 3x per day from 06/16/19 appt not sent to pill pak and pt needs

## 2019-06-30 NOTE — Telephone Encounter (Signed)
Fax Confirmed.

## 2019-07-02 DIAGNOSIS — G8929 Other chronic pain: Secondary | ICD-10-CM | POA: Insufficient documentation

## 2019-07-02 DIAGNOSIS — M25511 Pain in right shoulder: Secondary | ICD-10-CM | POA: Insufficient documentation

## 2019-07-02 DIAGNOSIS — N200 Calculus of kidney: Secondary | ICD-10-CM | POA: Insufficient documentation

## 2019-07-02 HISTORY — DX: Calculus of kidney: N20.0

## 2019-07-02 HISTORY — DX: Other chronic pain: G89.29

## 2019-07-14 DIAGNOSIS — Z95810 Presence of automatic (implantable) cardiac defibrillator: Secondary | ICD-10-CM | POA: Diagnosis not present

## 2019-07-14 DIAGNOSIS — G894 Chronic pain syndrome: Secondary | ICD-10-CM | POA: Diagnosis not present

## 2019-07-14 DIAGNOSIS — M79606 Pain in leg, unspecified: Secondary | ICD-10-CM | POA: Diagnosis not present

## 2019-07-14 DIAGNOSIS — M47816 Spondylosis without myelopathy or radiculopathy, lumbar region: Secondary | ICD-10-CM | POA: Diagnosis not present

## 2019-07-14 DIAGNOSIS — M792 Neuralgia and neuritis, unspecified: Secondary | ICD-10-CM | POA: Diagnosis not present

## 2019-07-18 DIAGNOSIS — M542 Cervicalgia: Secondary | ICD-10-CM | POA: Diagnosis not present

## 2019-07-18 DIAGNOSIS — M7541 Impingement syndrome of right shoulder: Secondary | ICD-10-CM | POA: Diagnosis not present

## 2019-07-18 DIAGNOSIS — M47812 Spondylosis without myelopathy or radiculopathy, cervical region: Secondary | ICD-10-CM | POA: Diagnosis not present

## 2019-07-18 DIAGNOSIS — M25512 Pain in left shoulder: Secondary | ICD-10-CM | POA: Diagnosis not present

## 2019-07-18 DIAGNOSIS — M25511 Pain in right shoulder: Secondary | ICD-10-CM | POA: Diagnosis not present

## 2019-08-11 DIAGNOSIS — M47816 Spondylosis without myelopathy or radiculopathy, lumbar region: Secondary | ICD-10-CM | POA: Diagnosis not present

## 2019-08-11 DIAGNOSIS — M792 Neuralgia and neuritis, unspecified: Secondary | ICD-10-CM | POA: Diagnosis not present

## 2019-08-11 DIAGNOSIS — G894 Chronic pain syndrome: Secondary | ICD-10-CM | POA: Diagnosis not present

## 2019-08-11 DIAGNOSIS — M79606 Pain in leg, unspecified: Secondary | ICD-10-CM | POA: Diagnosis not present

## 2019-08-12 ENCOUNTER — Telehealth: Payer: Self-pay | Admitting: Internal Medicine

## 2019-08-12 ENCOUNTER — Other Ambulatory Visit: Payer: Self-pay | Admitting: Internal Medicine

## 2019-08-12 DIAGNOSIS — I2510 Atherosclerotic heart disease of native coronary artery without angina pectoris: Secondary | ICD-10-CM | POA: Diagnosis not present

## 2019-08-12 DIAGNOSIS — N529 Male erectile dysfunction, unspecified: Secondary | ICD-10-CM

## 2019-08-12 DIAGNOSIS — I255 Ischemic cardiomyopathy: Secondary | ICD-10-CM | POA: Diagnosis not present

## 2019-08-12 DIAGNOSIS — I10 Essential (primary) hypertension: Secondary | ICD-10-CM | POA: Diagnosis not present

## 2019-08-12 DIAGNOSIS — E785 Hyperlipidemia, unspecified: Secondary | ICD-10-CM | POA: Diagnosis not present

## 2019-08-12 MED ORDER — SILDENAFIL CITRATE 20 MG PO TABS: ORAL_TABLET | ORAL | 11 refills | Status: DC

## 2019-08-12 NOTE — Telephone Encounter (Signed)
Patient is requesting a refill on his male stimulus drug. Patient was unable to give the medication's name.

## 2019-08-13 NOTE — Telephone Encounter (Signed)
Patient informed and verbalized understanding.  Medication sent in yesterday, pt states he just picked up the medication today.

## 2019-08-19 ENCOUNTER — Other Ambulatory Visit: Payer: Self-pay | Admitting: Internal Medicine

## 2019-08-19 DIAGNOSIS — G4700 Insomnia, unspecified: Secondary | ICD-10-CM

## 2019-08-19 MED ORDER — ZOLPIDEM TARTRATE 10 MG PO TABS: 10.0000 mg | ORAL_TABLET | Freq: Every evening | ORAL | 1 refills | Status: DC | PRN

## 2019-08-28 ENCOUNTER — Other Ambulatory Visit (INDEPENDENT_AMBULATORY_CARE_PROVIDER_SITE_OTHER): Payer: Medicare Other

## 2019-08-28 ENCOUNTER — Other Ambulatory Visit: Payer: Self-pay

## 2019-08-28 DIAGNOSIS — I255 Ischemic cardiomyopathy: Secondary | ICD-10-CM | POA: Diagnosis not present

## 2019-08-28 DIAGNOSIS — I2510 Atherosclerotic heart disease of native coronary artery without angina pectoris: Secondary | ICD-10-CM

## 2019-08-28 DIAGNOSIS — M25512 Pain in left shoulder: Secondary | ICD-10-CM

## 2019-08-28 DIAGNOSIS — Z7982 Long term (current) use of aspirin: Secondary | ICD-10-CM | POA: Diagnosis not present

## 2019-08-28 DIAGNOSIS — Z1159 Encounter for screening for other viral diseases: Secondary | ICD-10-CM | POA: Diagnosis not present

## 2019-08-28 DIAGNOSIS — Z95810 Presence of automatic (implantable) cardiac defibrillator: Secondary | ICD-10-CM | POA: Diagnosis not present

## 2019-08-28 DIAGNOSIS — E785 Hyperlipidemia, unspecified: Secondary | ICD-10-CM | POA: Diagnosis not present

## 2019-08-28 DIAGNOSIS — I25118 Atherosclerotic heart disease of native coronary artery with other forms of angina pectoris: Secondary | ICD-10-CM | POA: Diagnosis not present

## 2019-08-28 DIAGNOSIS — Z7902 Long term (current) use of antithrombotics/antiplatelets: Secondary | ICD-10-CM | POA: Diagnosis not present

## 2019-08-28 DIAGNOSIS — Z79899 Other long term (current) drug therapy: Secondary | ICD-10-CM | POA: Diagnosis not present

## 2019-08-28 DIAGNOSIS — R7303 Prediabetes: Secondary | ICD-10-CM | POA: Diagnosis not present

## 2019-08-28 DIAGNOSIS — I5023 Acute on chronic systolic (congestive) heart failure: Secondary | ICD-10-CM | POA: Diagnosis not present

## 2019-08-28 DIAGNOSIS — R079 Chest pain, unspecified: Secondary | ICD-10-CM | POA: Diagnosis not present

## 2019-08-28 DIAGNOSIS — I70293 Other atherosclerosis of native arteries of extremities, bilateral legs: Secondary | ICD-10-CM | POA: Diagnosis not present

## 2019-08-28 DIAGNOSIS — E559 Vitamin D deficiency, unspecified: Secondary | ICD-10-CM

## 2019-08-28 DIAGNOSIS — I5022 Chronic systolic (congestive) heart failure: Secondary | ICD-10-CM | POA: Diagnosis not present

## 2019-08-28 DIAGNOSIS — Z1329 Encounter for screening for other suspected endocrine disorder: Secondary | ICD-10-CM

## 2019-08-28 DIAGNOSIS — J439 Emphysema, unspecified: Secondary | ICD-10-CM | POA: Diagnosis not present

## 2019-08-28 DIAGNOSIS — G629 Polyneuropathy, unspecified: Secondary | ICD-10-CM | POA: Diagnosis not present

## 2019-08-28 DIAGNOSIS — I110 Hypertensive heart disease with heart failure: Secondary | ICD-10-CM | POA: Diagnosis not present

## 2019-08-28 DIAGNOSIS — Z79891 Long term (current) use of opiate analgesic: Secondary | ICD-10-CM | POA: Diagnosis not present

## 2019-08-28 DIAGNOSIS — M25511 Pain in right shoulder: Secondary | ICD-10-CM | POA: Diagnosis not present

## 2019-08-28 DIAGNOSIS — Z125 Encounter for screening for malignant neoplasm of prostate: Secondary | ICD-10-CM

## 2019-08-28 DIAGNOSIS — Z955 Presence of coronary angioplasty implant and graft: Secondary | ICD-10-CM | POA: Diagnosis not present

## 2019-08-28 DIAGNOSIS — Z0184 Encounter for antibody response examination: Secondary | ICD-10-CM

## 2019-08-28 DIAGNOSIS — I25110 Atherosclerotic heart disease of native coronary artery with unstable angina pectoris: Secondary | ICD-10-CM | POA: Diagnosis not present

## 2019-08-28 DIAGNOSIS — I10 Essential (primary) hypertension: Secondary | ICD-10-CM

## 2019-08-28 DIAGNOSIS — I739 Peripheral vascular disease, unspecified: Secondary | ICD-10-CM | POA: Diagnosis not present

## 2019-08-28 DIAGNOSIS — I714 Abdominal aortic aneurysm, without rupture: Secondary | ICD-10-CM | POA: Diagnosis not present

## 2019-08-28 DIAGNOSIS — G8929 Other chronic pain: Secondary | ICD-10-CM

## 2019-08-28 DIAGNOSIS — I252 Old myocardial infarction: Secondary | ICD-10-CM | POA: Diagnosis not present

## 2019-08-28 DIAGNOSIS — R3912 Poor urinary stream: Secondary | ICD-10-CM | POA: Diagnosis not present

## 2019-08-28 DIAGNOSIS — R918 Other nonspecific abnormal finding of lung field: Secondary | ICD-10-CM | POA: Diagnosis not present

## 2019-08-28 DIAGNOSIS — I7777 Dissection of artery of lower extremity: Secondary | ICD-10-CM | POA: Diagnosis not present

## 2019-08-28 DIAGNOSIS — Z20822 Contact with and (suspected) exposure to covid-19: Secondary | ICD-10-CM | POA: Diagnosis not present

## 2019-08-28 LAB — COMPREHENSIVE METABOLIC PANEL
ALT: 16 U/L (ref 0–53)
AST: 21 U/L (ref 0–37)
Albumin: 4.4 g/dL (ref 3.5–5.2)
Alkaline Phosphatase: 42 U/L (ref 39–117)
BUN: 23 mg/dL (ref 6–23)
CO2: 26 mEq/L (ref 19–32)
Calcium: 9.5 mg/dL (ref 8.4–10.5)
Chloride: 105 mEq/L (ref 96–112)
Creatinine, Ser: 0.88 mg/dL (ref 0.40–1.50)
GFR: 105.28 mL/min (ref >60.00)
Glucose, Bld: 66 mg/dL — ABNORMAL LOW (ref 70–99)
Potassium: 3.6 mEq/L (ref 3.5–5.1)
Sodium: 139 mEq/L (ref 135–145)
Total Bilirubin: 0.2 mg/dL (ref 0.2–1.2)
Total Protein: 7 g/dL (ref 6.0–8.3)

## 2019-08-28 LAB — CBC WITH DIFFERENTIAL/PLATELET
Basophils Absolute: 0 10*3/uL (ref 0.0–0.1)
Basophils Relative: 0.5 % (ref 0.0–3.0)
Eosinophils Absolute: 0 10*3/uL (ref 0.0–0.7)
Eosinophils Relative: 0.9 % (ref 0.0–5.0)
HCT: 41.9 % (ref 39.0–52.0)
Hemoglobin: 13.7 g/dL (ref 13.0–17.0)
Lymphocytes Relative: 34.7 % (ref 12.0–46.0)
Lymphs Abs: 1.8 10*3/uL (ref 0.7–4.0)
MCHC: 32.6 g/dL (ref 30.0–36.0)
MCV: 94.6 fl (ref 78.0–100.0)
Monocytes Absolute: 0.8 10*3/uL (ref 0.1–1.0)
Monocytes Relative: 14.6 % — ABNORMAL HIGH (ref 3.0–12.0)
Neutro Abs: 2.5 10*3/uL (ref 1.4–7.7)
Neutrophils Relative %: 49.3 % (ref 43.0–77.0)
Platelets: 275 10*3/uL (ref 150.0–400.0)
RBC: 4.42 Mil/uL (ref 4.22–5.81)
RDW: 15.3 % (ref 11.5–15.5)
WBC: 5.2 10*3/uL (ref 4.0–10.5)

## 2019-08-28 LAB — LIPID PANEL
Cholesterol: 104 mg/dL (ref 0–200)
HDL: 38.9 mg/dL — ABNORMAL LOW (ref >39.00)
LDL Cholesterol: 54 mg/dL (ref 0–99)
NonHDL: 65.38
Total CHOL/HDL Ratio: 3
Triglycerides: 55 mg/dL (ref 0.0–149.0)
VLDL: 11 mg/dL (ref 0.0–40.0)

## 2019-08-28 LAB — HEMOGLOBIN A1C: Hgb A1c MFr Bld: 6.4 % (ref 4.6–6.5)

## 2019-08-28 LAB — TSH: TSH: 1.67 u[IU]/mL (ref 0.35–4.50)

## 2019-08-28 LAB — VITAMIN D 25 HYDROXY (VIT D DEFICIENCY, FRACTURES): VITD: 97.61 ng/mL (ref 30.00–100.00)

## 2019-08-28 NOTE — Addendum Note (Signed)
Addended by: Quentin Ore on: 08/28/2019 09:11 AM   Modules accepted: Orders

## 2019-08-29 ENCOUNTER — Telehealth: Payer: Self-pay

## 2019-08-29 LAB — HEPATITIS B SURFACE ANTIBODY, QUANTITATIVE: Hep B S AB Quant (Post): <5 m[IU]/mL — ABNORMAL LOW (ref >=10)

## 2019-08-29 NOTE — Telephone Encounter (Signed)
Left message for patient to return call back for lab results.

## 2019-09-01 ENCOUNTER — Telehealth: Payer: Self-pay

## 2019-09-01 MED ORDER — ISOSORBIDE MONONITRATE ER 60 MG PO TB24
60.00 | ORAL_TABLET | ORAL | Status: DC
Start: 2019-09-02 — End: 2019-09-01

## 2019-09-01 MED ORDER — LISINOPRIL 5 MG PO TABS
5.00 | ORAL_TABLET | ORAL | Status: DC
Start: 2019-09-03 — End: 2019-09-01

## 2019-09-01 MED ORDER — ATORVASTATIN CALCIUM 80 MG PO TABS
80.00 | ORAL_TABLET | ORAL | Status: DC
Start: 2019-09-03 — End: 2019-09-01

## 2019-09-01 MED ORDER — MIRTAZAPINE 15 MG PO TABS
30.00 | ORAL_TABLET | ORAL | Status: DC
Start: 2019-09-02 — End: 2019-09-01

## 2019-09-01 MED ORDER — PANTOPRAZOLE SODIUM 40 MG PO TBEC
40.00 | DELAYED_RELEASE_TABLET | ORAL | Status: DC
Start: 2019-09-03 — End: 2019-09-01

## 2019-09-01 MED ORDER — METOPROLOL SUCCINATE ER 25 MG PO TB24
25.00 | ORAL_TABLET | ORAL | Status: DC
Start: 2019-09-03 — End: 2019-09-01

## 2019-09-01 MED ORDER — GABAPENTIN 300 MG PO CAPS
300.00 | ORAL_CAPSULE | ORAL | Status: DC
Start: 2019-09-02 — End: 2019-09-01

## 2019-09-01 MED ORDER — OXYCODONE HCL 5 MG PO TABS
5.00 | ORAL_TABLET | ORAL | Status: DC
Start: ? — End: 2019-09-01

## 2019-09-01 MED ORDER — BACLOFEN 10 MG PO TABS
5.00 | ORAL_TABLET | ORAL | Status: DC
Start: 2019-09-02 — End: 2019-09-01

## 2019-09-01 MED ORDER — NITROGLYCERIN 0.4 MG SL SUBL
0.40 | SUBLINGUAL_TABLET | SUBLINGUAL | Status: DC
Start: ? — End: 2019-09-01

## 2019-09-01 MED ORDER — AMLODIPINE BESYLATE 5 MG PO TABS
2.50 | ORAL_TABLET | ORAL | Status: DC
Start: 2019-09-03 — End: 2019-09-01

## 2019-09-01 MED ORDER — LIDOCAINE HCL 1 % IJ SOLN
0.50 | INTRAMUSCULAR | Status: DC
Start: ? — End: 2019-09-01

## 2019-09-01 MED ORDER — MELATONIN 3 MG PO TABS
3.00 | ORAL_TABLET | ORAL | Status: DC
Start: 2019-09-02 — End: 2019-09-01

## 2019-09-01 MED ORDER — ACETAMINOPHEN 325 MG PO TABS
650.00 | ORAL_TABLET | ORAL | Status: DC
Start: ? — End: 2019-09-01

## 2019-09-01 MED ORDER — CLOPIDOGREL BISULFATE 75 MG PO TABS
75.00 | ORAL_TABLET | ORAL | Status: DC
Start: 2019-09-03 — End: 2019-09-01

## 2019-09-01 MED ORDER — ASPIRIN 81 MG PO TBEC
81.00 | DELAYED_RELEASE_TABLET | ORAL | Status: DC
Start: 2019-09-03 — End: 2019-09-01

## 2019-09-01 MED ORDER — TAMSULOSIN HCL 0.4 MG PO CAPS
0.40 | ORAL_CAPSULE | ORAL | Status: DC
Start: 2019-09-03 — End: 2019-09-01

## 2019-09-01 NOTE — Telephone Encounter (Signed)
Noted let pt know we are thinking about him hope he is doing better soon   TMS

## 2019-09-01 NOTE — Telephone Encounter (Signed)
Left patient a voicemail that we are thinking about him and hopes that he gets better.

## 2019-09-01 NOTE — Telephone Encounter (Signed)
Patient wanted me to tell you he is in the hospital. He has been there since he left our office. He is having some stints placed in his heart per patient.

## 2019-09-02 MED ORDER — GENERIC EXTERNAL MEDICATION
1.00 | Status: DC
Start: ? — End: 2019-09-02

## 2019-09-02 MED ORDER — ACETAMINOPHEN 325 MG PO TABS
650.00 | ORAL_TABLET | ORAL | Status: DC
Start: ? — End: 2019-09-02

## 2019-09-03 ENCOUNTER — Ambulatory Visit: Payer: Medicare Other | Admitting: Urology

## 2019-09-04 ENCOUNTER — Telehealth: Payer: Self-pay

## 2019-09-04 NOTE — Telephone Encounter (Signed)
Noted  Dr. TMS 

## 2019-09-04 NOTE — Telephone Encounter (Signed)
Spoke with patient regarding transition of care hospital follow up. Confirms he is scheduled and following up with [facility]. Notes he is doing okay. Encouraged patient to call office if there is anything we can do to help and as needed.

## 2019-09-05 ENCOUNTER — Encounter: Payer: Self-pay | Admitting: Internal Medicine

## 2019-09-10 ENCOUNTER — Other Ambulatory Visit: Payer: Self-pay

## 2019-09-10 ENCOUNTER — Ambulatory Visit: Payer: Medicare Other | Admitting: Urology

## 2019-09-10 ENCOUNTER — Ambulatory Visit (INDEPENDENT_AMBULATORY_CARE_PROVIDER_SITE_OTHER): Payer: Medicare Other | Admitting: Urology

## 2019-09-10 ENCOUNTER — Encounter: Payer: Self-pay | Admitting: Urology

## 2019-09-10 VITALS — BP 94/61 | HR 85 | Ht 69.0 in | Wt 129.0 lb

## 2019-09-10 DIAGNOSIS — R319 Hematuria, unspecified: Secondary | ICD-10-CM | POA: Diagnosis not present

## 2019-09-10 DIAGNOSIS — R339 Retention of urine, unspecified: Secondary | ICD-10-CM | POA: Diagnosis not present

## 2019-09-10 DIAGNOSIS — N200 Calculus of kidney: Secondary | ICD-10-CM

## 2019-09-10 LAB — MICROSCOPIC EXAMINATION: Bacteria, UA: NONE SEEN

## 2019-09-10 LAB — URINALYSIS, COMPLETE
Bilirubin, UA: NEGATIVE
Glucose, UA: NEGATIVE
Leukocytes,UA: NEGATIVE
Nitrite, UA: NEGATIVE
Protein,UA: NEGATIVE
Specific Gravity, UA: 1.025 (ref 1.005–1.030)
Urobilinogen, Ur: 0.2 mg/dL (ref 0.2–1.0)
pH, UA: 5 (ref 5.0–7.5)

## 2019-09-10 LAB — BLADDER SCAN AMB NON-IMAGING: Scan Result: 82

## 2019-09-10 NOTE — Progress Notes (Signed)
09/10/19 2:21 PM   [place]. 1955/12/28 [id number]  CC: Nephrolithiasis, urinary symptoms  HPI: I saw [name] in urology clinic today for incidentally found nephrolithiasis as well as some urinary symptoms.  He is a comorbid 64 year old male with extensive cardiac history, including recent hospitalization at [facility] who is anticoagulated with Plavix.  He has never had any stone events in the past.  On previous imaging dating back to at least 2017 he has had a 1.5 cm left lower pole non-obstructive stone.  This has enlarged only slightly over the last few years.  He denies any left-sided flank pain or gross hematuria.  When he was recently admitted at [facility] he gave a urine sample with greater than 50 RBCs and he had some intermittent urinary symptoms of feeling like something was "lodged "in his urethra.  Urine culture at that time showed only mixed flora. It sounds like he was straight cath at that point which improved his symptoms.  There are no notes from the [place] system regarding this catheterization.  Recent CT abdomen pelvis with and without contrast with delayed films and an outside system on 08/29/2019 showed no hydronephrosis, renal masses, or filling defects.  He has a 25-pack-year smoking history.  He has been on Flomax for his urinary symptoms.   He is a poor historian and is difficult to obtain a complete history.  PSA 2.27 February 2019.  Urinalysis today with 0-5 WBCs, 3-10 RBCs, no bacteria, nitrite negative, no leukocytes.  PVR normal at 80 mL.   PMH: Past Medical History:  Diagnosis Date   Allergy    CAD (coronary artery disease)    Depression    Frequent headaches    GERD (gastroesophageal reflux disease)    Headache    Hyperlipidemia    Hypertension    PAD (peripheral artery disease) (HCC)    Tobacco abuse     Surgical History: Past Surgical History:  Procedure Laterality Date   COLONOSCOPY WITH PROPOFOL N/A 06/20/2017   Procedure: COLONOSCOPY WITH PROPOFOL;  Surgeon: Jonathon Bellows, MD;  Location: [facility] ENDOSCOPY;  Service: Gastroenterology;  Laterality: N/A;   COLONOSCOPY WITH PROPOFOL N/A 07/24/2017   Procedure: COLONOSCOPY WITH PROPOFOL;  Surgeon: Jonathon Bellows, MD;  Location: [facility] ENDOSCOPY;  Service: Gastroenterology;  Laterality: N/A;   ESOPHAGOGASTRODUODENOSCOPY (EGD) WITH PROPOFOL N/A 06/20/2017   Procedure: ESOPHAGOGASTRODUODENOSCOPY (EGD) WITH PROPOFOL;  Surgeon: Jonathon Bellows, MD;  Location: [facility] ENDOSCOPY;  Service: Gastroenterology;  Laterality: N/A;   PERCUTANEOUS CORONARY STENT INTERVENTION (PCI-S)     STOMACH SURGERY     2009 [facility] per chart review pt had AAA repair    TONSILLECTOMY      Family History: Family History  Problem Relation Age of Onset   Diabetes Mother    Heart disease Mother    Hypertension Mother    Stroke Mother    Prostate cancer Father    Cancer Father        prostate cancer in 26s   Diabetes Sister    Heart disease Sister    Ulcers Other        unknown who had ulcers in family     Social History:  reports that he has been smoking cigarettes. He has been smoking about 0.25 packs per day. He has never used smokeless tobacco. He reports that he does not drink alcohol or use drugs.  Physical Exam: BP 94/61    Pulse 85    Ht 5\' 9"  (1.753 m)    Wt  129 lb (58.5 kg)    BMI 19.05 kg/m    Constitutional:  Alert and oriented, No acute distress. Cardiovascular: No clubbing, cyanosis, or edema. Respiratory: Normal respiratory effort, no increased work of breathing. GI: Abdomen is soft, nontender, nondistended, no abdominal masses GU: Uncircumcised phallus with widely patent meatus, testicles 20 cc and descended bilaterally without masses  Laboratory Data: Reviewed, see HPI  Pertinent Imaging: See HPI  Assessment & Plan:   In summary he is a 64 year old male with extensive cardiac history and long history of urinary symptoms, as well as multiple episodes of  microscopic hematuria.  CT with no evidence of renal masses or filling defects.  He has a known stable 1.5 cm left lower pole stone that is asymptomatic, and we discussed treatment options including observation or ureteroscopy with laser lithotripsy.  He would like to pursue observation which is very reasonable.  We discussed common possible etiologies of hematuria including BPH, malignancy, urolithiasis, medical renal disease, and idiopathic. Standard workup recommended by the AUA includes imaging with CT urogram to assess the upper tracts, and cystoscopy. Cytology is performed on patient's with gross hematuria to look for malignant cells in the urine.  Follow-up for cystoscopy to complete hematuria work-up, and evaluate urinary symptoms   Legrand Rams, MD 09/10/2019  [facility] [address] [place], [place] [address] [phone]

## 2019-09-11 DIAGNOSIS — Z79891 Long term (current) use of opiate analgesic: Secondary | ICD-10-CM | POA: Diagnosis not present

## 2019-09-11 DIAGNOSIS — M25519 Pain in unspecified shoulder: Secondary | ICD-10-CM | POA: Diagnosis not present

## 2019-09-11 DIAGNOSIS — G894 Chronic pain syndrome: Secondary | ICD-10-CM | POA: Diagnosis not present

## 2019-09-11 DIAGNOSIS — Z79899 Other long term (current) drug therapy: Secondary | ICD-10-CM | POA: Diagnosis not present

## 2019-09-11 DIAGNOSIS — M47816 Spondylosis without myelopathy or radiculopathy, lumbar region: Secondary | ICD-10-CM | POA: Diagnosis not present

## 2019-09-11 DIAGNOSIS — M79606 Pain in leg, unspecified: Secondary | ICD-10-CM | POA: Diagnosis not present

## 2019-09-12 DIAGNOSIS — I255 Ischemic cardiomyopathy: Secondary | ICD-10-CM | POA: Diagnosis not present

## 2019-09-12 DIAGNOSIS — I5022 Chronic systolic (congestive) heart failure: Secondary | ICD-10-CM | POA: Diagnosis not present

## 2019-09-12 DIAGNOSIS — I2510 Atherosclerotic heart disease of native coronary artery without angina pectoris: Secondary | ICD-10-CM | POA: Diagnosis not present

## 2019-09-12 DIAGNOSIS — I10 Essential (primary) hypertension: Secondary | ICD-10-CM | POA: Diagnosis not present

## 2019-09-12 DIAGNOSIS — E785 Hyperlipidemia, unspecified: Secondary | ICD-10-CM | POA: Diagnosis not present

## 2019-09-14 LAB — CULTURE, URINE COMPREHENSIVE

## 2019-09-18 ENCOUNTER — Other Ambulatory Visit: Payer: Self-pay

## 2019-09-18 ENCOUNTER — Ambulatory Visit (INDEPENDENT_AMBULATORY_CARE_PROVIDER_SITE_OTHER): Payer: Medicare Other | Admitting: Urology

## 2019-09-18 ENCOUNTER — Encounter: Payer: Self-pay | Admitting: Urology

## 2019-09-18 VITALS — BP 104/68 | HR 77 | Ht 69.0 in | Wt 129.0 lb

## 2019-09-18 DIAGNOSIS — N138 Other obstructive and reflux uropathy: Secondary | ICD-10-CM

## 2019-09-18 DIAGNOSIS — N401 Enlarged prostate with lower urinary tract symptoms: Secondary | ICD-10-CM

## 2019-09-18 DIAGNOSIS — R319 Hematuria, unspecified: Secondary | ICD-10-CM

## 2019-09-18 LAB — URINALYSIS, COMPLETE
Bilirubin, UA: NEGATIVE
Glucose, UA: NEGATIVE
Leukocytes,UA: NEGATIVE
Nitrite, UA: NEGATIVE
Protein,UA: NEGATIVE
Specific Gravity, UA: 1.025 (ref 1.005–1.030)
Urobilinogen, Ur: 0.2 mg/dL (ref 0.2–1.0)
pH, UA: 5 (ref 5.0–7.5)

## 2019-09-18 LAB — MICROSCOPIC EXAMINATION: Bacteria, UA: NONE SEEN

## 2019-09-18 MED ORDER — LIDOCAINE HCL URETHRAL/MUCOSAL 2 % EX GEL
1.0000 "application " | Freq: Once | CUTANEOUS | Status: AC
  Administered 2019-09-18: 1 via URETHRAL

## 2019-09-18 MED ORDER — FINASTERIDE 5 MG PO TABS
5.0000 mg | ORAL_TABLET | Freq: Every day | ORAL | 11 refills | Status: DC
Start: 2019-09-18 — End: 2020-08-27

## 2019-09-18 NOTE — Progress Notes (Addendum)
Cystoscopy Procedure Note:  Indication: Microscopic hematuria, urinary symptoms   After informed consent and discussion of the procedure and its risks, Martin Hernandez. was positioned and prepped in the standard fashion. Cystoscopy was performed with a flexible cystoscope. The urethra, bladder neck and entire bladder was visualized in a standard fashion. The prostate was moderate in size. The ureteral orifices were visualized in their normal location and orientation. No abnormalities on retroflexion.  Imaging: CT report at [facility] 08/29/19 with no renal masses or filling defects, bilateral stable lower pole stones, unable to personally review films to obtain prostate volume  CT 2017 with 40g prostate  Findings: Normal cystoscopy  Assessment and Plan: Start finasteride, RTC 6 months IPSS/PVR/symptom check  Legrand Rams, MD 09/18/2019

## 2019-09-18 NOTE — Patient Instructions (Signed)

## 2019-09-26 ENCOUNTER — Telehealth: Payer: Self-pay | Admitting: Internal Medicine

## 2019-09-26 NOTE — Telephone Encounter (Signed)
Faxed request for refill prescription for vitamin D3 50,000 units to [facility] 09-24-19 faxed on 09-26-19

## 2019-10-09 ENCOUNTER — Telehealth: Payer: Self-pay | Admitting: Internal Medicine

## 2019-10-09 ENCOUNTER — Ambulatory Visit: Payer: Medicare Other | Admitting: Internal Medicine

## 2019-10-09 NOTE — Telephone Encounter (Signed)
Patient no-showed today's appointment; appointment was for 10/09/19 at 8:00 am, provider notified for review of record. MyChart sent to re-schedule.

## 2019-10-14 DIAGNOSIS — M5136 Other intervertebral disc degeneration, lumbar region: Secondary | ICD-10-CM | POA: Diagnosis not present

## 2019-10-14 DIAGNOSIS — M47817 Spondylosis without myelopathy or radiculopathy, lumbosacral region: Secondary | ICD-10-CM | POA: Diagnosis not present

## 2019-10-17 ENCOUNTER — Ambulatory Visit (INDEPENDENT_AMBULATORY_CARE_PROVIDER_SITE_OTHER): Payer: Medicare Other | Admitting: Internal Medicine

## 2019-10-17 ENCOUNTER — Encounter: Payer: Self-pay | Admitting: Internal Medicine

## 2019-10-17 ENCOUNTER — Other Ambulatory Visit: Payer: Self-pay

## 2019-10-17 VITALS — BP 106/70 | HR 86 | Temp 98.3°F | Ht 69.0 in | Wt 129.8 lb

## 2019-10-17 DIAGNOSIS — I25118 Atherosclerotic heart disease of native coronary artery with other forms of angina pectoris: Secondary | ICD-10-CM

## 2019-10-17 DIAGNOSIS — I7090 Unspecified atherosclerosis: Secondary | ICD-10-CM

## 2019-10-17 DIAGNOSIS — N401 Enlarged prostate with lower urinary tract symptoms: Secondary | ICD-10-CM

## 2019-10-17 DIAGNOSIS — I739 Peripheral vascular disease, unspecified: Secondary | ICD-10-CM

## 2019-10-17 DIAGNOSIS — M47812 Spondylosis without myelopathy or radiculopathy, cervical region: Secondary | ICD-10-CM | POA: Diagnosis not present

## 2019-10-17 DIAGNOSIS — J431 Panlobular emphysema: Secondary | ICD-10-CM

## 2019-10-17 DIAGNOSIS — Z720 Tobacco use: Secondary | ICD-10-CM

## 2019-10-17 DIAGNOSIS — J309 Allergic rhinitis, unspecified: Secondary | ICD-10-CM | POA: Diagnosis not present

## 2019-10-17 DIAGNOSIS — R918 Other nonspecific abnormal finding of lung field: Secondary | ICD-10-CM

## 2019-10-17 DIAGNOSIS — R338 Other retention of urine: Secondary | ICD-10-CM

## 2019-10-17 HISTORY — DX: Unspecified atherosclerosis: I70.90

## 2019-10-17 HISTORY — DX: Spondylosis without myelopathy or radiculopathy, cervical region: M47.812

## 2019-10-17 MED ORDER — MUCINEX DM MAXIMUM STRENGTH 60-1200 MG PO TB12: 1.0000 | ORAL_TABLET | Freq: Two times a day (BID) | ORAL | 3 refills | Status: DC | PRN

## 2019-10-17 MED ORDER — NICOTINE 21 MG/24HR TD PT24: 21.0000 mg | MEDICATED_PATCH | Freq: Every day | TRANSDERMAL | 0 refills | Status: DC

## 2019-10-17 MED ORDER — NICOTINE 14 MG/24HR TD PT24: 14.0000 mg | MEDICATED_PATCH | Freq: Every day | TRANSDERMAL | 0 refills | Status: DC

## 2019-10-17 MED ORDER — SALINE SPRAY 0.65 % NA SOLN: 2.0000 | NASAL | 11 refills | Status: DC | PRN

## 2019-10-17 MED ORDER — LEVOCETIRIZINE DIHYDROCHLORIDE 5 MG PO TABS: 5.0000 mg | ORAL_TABLET | Freq: Every evening | ORAL | 3 refills | Status: DC

## 2019-10-17 MED ORDER — NICOTINE 7 MG/24HR TD PT24: 7.0000 mg | MEDICATED_PATCH | Freq: Every day | TRANSDERMAL | 0 refills | Status: DC

## 2019-10-17 MED ORDER — ALBUTEROL SULFATE HFA 108 (90 BASE) MCG/ACT IN AERS: 1.0000 | INHALATION_SPRAY | Freq: Four times a day (QID) | RESPIRATORY_TRACT | 3 refills | Status: DC | PRN

## 2019-10-17 NOTE — Patient Instructions (Addendum)
11/05/2019 Office Visit Vascular Surgery Tacy Learn, MD  [address]  [place], [place] [address]  [phone]  [phone] (Fax)    12/22/2019 Appointment Cardiology Stiber, Maren Reamer, MD  [address]  [place], [place] [address]  [phone]  [phone] (Fax)    12/22/2019 Office Visit Cardiology Maurine Cane, Maren Reamer, MD  [address] [facility]  [place], [place] [address]  [phone]  [phone] (Fax)     Cervical x-rays (AP, lateral, and oblique views) are obtained. 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 There is no evidence for  spondylolysis or spondylolisthesis. No lytic lesions or fractures are  identified. Specimen Collected: --  Date: 07/18/19   dupray [facility] steamer   Neck Exercises Ask your health care provider which exercises are safe for you. Do exercises exactly as told by your health care provider and adjust them as directed. It is normal to feel mild stretching, pulling, tightness, or discomfort as you do these exercises. Stop right away if you feel sudden pain or your pain gets worse. Do not begin these exercises until told by your health care provider. Neck exercises can be important for many reasons. They can improve strength and maintain flexibility in your neck, which will help your upper back and prevent neck pain. Stretching exercises Rotation neck stretching  1. Sit in a chair or stand up. 2. Place your feet flat on the floor, shoulder width apart. 3. Slowly turn your head (rotate) to the right until a slight stretch is felt. Turn it all the way to the right so you can look over your right shoulder. Do not tilt or tip your head. 4. Hold this position for 10-30 seconds. 5. Slowly turn your head (rotate) to the left until a slight stretch is felt. Turn it all  the way to the left so you can look over your left shoulder. Do not tilt or tip your head. 6. Hold this position for 10-30 seconds. Repeat __________ times. Complete this exercise __________ times a day. Neck retraction 1. Sit in a sturdy chair or stand up. 2. Look straight ahead. Do not bend your neck. 3. Use your fingers to push your chin backward (retraction). Do not bend your neck for this movement. Continue to face straight ahead. If you are doing the exercise properly, you will feel a slight sensation in your throat and a stretch at the back of your neck. 4. Hold the stretch for 1-2 seconds. Repeat __________ times. Complete this exercise __________ times a day. Strengthening exercises Neck press 1. Lie on your back on a firm bed or on the floor with a pillow under your head. 2. Use your neck muscles to push your head down on the pillow and straighten your spine. 3. Hold the position as well as you can. Keep your head facing up (in a neutral position) and your chin tucked. 4. Slowly count to 5 while holding this position. Repeat __________ times. Complete this exercise __________ times a day. Isometrics These are exercises in which you strengthen the muscles in your neck while keeping your neck still (isometrics). 1. Sit in a supportive chair and place your hand on your forehead. 2. Keep your head and face facing straight ahead. Do not flex or extend your neck while doing isometrics. 3. Push forward with your head and neck while pushing back with your hand. Hold for 10  seconds. 4. Do the sequence again, this time putting your hand against the back of your head. Use your head and neck to push backward against the hand pressure. 5. Finally, do the same exercise on either side of your head, pushing sideways against the pressure of your hand. Repeat __________ times. Complete this exercise __________ times a day. Prone head lifts 1. Lie face-down (prone position), resting on your elbows so  that your chest and upper back are raised. 2. Start with your head facing downward, near your chest. Position your chin either on or near your chest. 3. Slowly lift your head upward. Lift until you are looking straight ahead. Then continue lifting your head as far back as you can comfortably stretch. 4. Hold your head up for 5 seconds. Then slowly lower it to your starting position. Repeat __________ times. Complete this exercise __________ times a day. Supine head lifts 1. Lie on your back (supine position), bending your knees to point to the ceiling and keeping your feet flat on the floor. 2. Lift your head slowly off the floor, raising your chin toward your chest. 3. Hold for 5 seconds. Repeat __________ times. Complete this exercise __________ times a day. Scapular retraction 1. Stand with your arms at your sides. Look straight ahead. 2. Slowly pull both shoulders (scapulae) backward and downward (retraction) until you feel a stretch between your shoulder blades in your upper back. 3. Hold for 10-30 seconds. 4. Relax and repeat. Repeat __________ times. Complete this exercise __________ times a day. Contact a health care provider if:  Your neck pain or discomfort gets much worse when you do an exercise.  Your neck pain or discomfort does not improve within 2 hours after you exercise. If you have any of these problems, stop exercising right away. Do not do the exercises again unless your health care provider says that you can. Get help right away if:  You develop sudden, severe neck pain. If this happens, stop exercising right away. Do not do the exercises again unless your health care provider says that you can. This information is not intended to replace advice given to you by your health care provider. Make sure you discuss any questions you have with your health care provider. Document Revised: 01/23/2018 Document Reviewed: 01/23/2018 Elsevier Patient Education  2020 Elsevier  Inc.    Chronic Obstructive Pulmonary Disease Exacerbation Chronic obstructive pulmonary disease (COPD) is a long-term (chronic) lung problem. In COPD, the flow of air from the lungs is limited. COPD exacerbations are times that breathing gets worse and you need more than your normal treatment. Without treatment, they can be life threatening. If they happen often, your lungs can become more damaged. If your COPD gets worse, your doctor may treat you with:  Medicines.  Oxygen.  Different ways to clear your airway, such as using a mask. Follow these instructions at home: Medicines  Take over-the-counter and prescription medicines only as told by your doctor.  If you take an antibiotic or steroid medicine, do not stop taking the medicine even if you start to feel better.  Keep up with shots (vaccinations) as told by your doctor. Be sure to get a yearly (annual) flu shot. Lifestyle  Do not smoke. If you need help quitting, ask your doctor.  Eat healthy foods.  Exercise regularly.  Get plenty of sleep.  Avoid tobacco smoke and other things that can bother your lungs.  Wash your hands often with soap and water. This will help keep  you from getting an infection. If you cannot use soap and water, use hand sanitizer.  During flu season, avoid areas that are crowded with people. General instructions  Drink enough fluid to keep your pee (urine) clear or pale yellow. Do not do this if your doctor has told you not to.  Use a cool mist machine (vaporizer).  If you use oxygen or a machine that turns medicine into a mist (nebulizer), continue to use it as told.  Follow all instructions for rehabilitation. These are steps you can take to make your body work better.  Keep all follow-up visits as told by your doctor. This is important. Contact a doctor if:  Your COPD symptoms get worse than normal. Get help right away if:  You are short of breath and it gets worse.  You have trouble  talking.  You have chest pain.  You cough up blood.  You have a fever.  You keep throwing up (vomiting).  You feel weak or you pass out (faint).  You feel confused.  You are not able to sleep because of your symptoms.  You are not able to do daily activities. Summary  COPD exacerbations are times that breathing gets worse and you need more treatment than normal.  COPD exacerbations can be very serious and may cause your lungs to become more damaged.  Do not smoke. If you need help quitting, ask your doctor.  Stay up-to-date on your shots. Get a flu shot every year. This information is not intended to replace advice given to you by your health care provider. Make sure you discuss any questions you have with your health care provider. Document Revised: 03/09/2017 Document Reviewed: 05/01/2016 Elsevier Patient Education  2020 Elsevier Inc.  Chronic Obstructive Pulmonary Disease  Chronic obstructive pulmonary disease (COPD) is a long-term (chronic) condition that affects the lungs. COPD is a general term that can be used to describe many different lung problems that cause lung swelling (inflammation) and limit airflow, including chronic bronchitis and emphysema. If you have COPD, your lung function will probably never return to normal. In most cases, it gets worse over time. However, there are steps you can take to slow the progression of the disease and improve your quality of life. What are the causes? This condition may be caused by:  Smoking. This is the most common cause.  Certain genes passed down through families. What increases the risk? The following factors may make you more likely to develop this condition:  Secondhand smoke from cigarettes, pipes, or cigars.  Exposure to chemicals and other irritants such as fumes and dust in the work environment.  Chronic lung conditions or infections. What are the signs or symptoms? Symptoms of this condition  include:  Shortness of breath, especially during physical activity.  Chronic cough with a large amount of thick mucus. Sometimes the cough may not have any mucus (dry cough).  Wheezing.  Rapid breaths.  Gray or bluish discoloration (cyanosis) of the skin, especially in your fingers, toes, or lips.  Feeling tired (fatigue).  Weight loss.  Chest tightness.  Frequent infections.  Episodes when breathing symptoms become much worse (exacerbations).  Swelling in the ankles, feet, or legs. This may occur in later stages of the disease. How is this diagnosed? This condition is diagnosed based on:  Your medical history.  A physical exam. You may also have tests, including:  Lung (pulmonary) function tests. This may include a spirometry test, which measures your ability to exhale properly.  Chest X-ray.  CT scan.  Blood tests. How is this treated? This condition may be treated with:  Medicines. These may include inhaled rescue medicines to treat acute exacerbations as well as long-term, or maintenance, medicines to prevent flare-ups of COPD. ? Bronchodilators help treat COPD by dilating the airways to allow increased airflow and make your breathing more comfortable. ? Steroids can reduce airway inflammation and help prevent exacerbations.  Smoking cessation. If you smoke, your health care provider may ask you to quit, and may also recommend therapy or replacement products to help you quit.  Pulmonary rehabilitation. This may involve working with a team of health care providers and specialists, such as respiratory, occupational, and physical therapists.  Exercise and physical activity. These are beneficial for nearly all people with COPD.  Nutrition therapy to gain weight, if you are underweight.  Oxygen. Supplemental oxygen therapy is only helpful if you have a low oxygen level in your blood (hypoxemia).  Lung surgery or transplant.  Palliative care. This is to help  people with COPD feel comfortable when treatment is no longer working. Follow these instructions at home: Medicines  Take over-the-counter and prescription medicines (inhaled or pills) only as told by your health care provider.  Talk to your health care provider before taking any cough or allergy medicines. You may need to avoid certain medicines that dry out your airways. Lifestyle  If you are a smoker, the most important thing that you can do is to stop smoking. Do not use any products that contain nicotine or tobacco, such as cigarettes and e-cigarettes. If you need help quitting, ask your health care provider. Continuing to smoke will cause the disease to progress faster.  Avoid exposure to things that irritate your lungs, such as smoke, chemicals, and fumes.  Stay active, but balance activity with periods of rest. Exercise and physical activity will help you maintain your ability to do things you want to do.  Learn and use relaxation techniques to manage stress and to control your breathing.  Get the right amount of sleep and get quality sleep. Most adults need 7 or more hours per night.  Eat healthy foods. Eating smaller, more frequent meals and resting before meals may help you maintain your strength. Controlled breathing Learn and use controlled breathing techniques as directed by your health care provider. Controlled breathing techniques include:  Pursed lip breathing. Start by breathing in (inhaling) through your nose for 1 second. Then, purse your lips as if you were going to whistle and breathe out (exhale) through the pursed lips for 2 seconds.  Diaphragmatic breathing. Start by putting one hand on your abdomen just above your waist. Inhale slowly through your nose. The hand on your abdomen should move out. Then purse your lips and exhale slowly. You should be able to feel the hand on your abdomen moving in as you exhale. Controlled coughing Learn and use controlled coughing  to clear mucus from your lungs. Controlled coughing is a series of short, progressive coughs. The steps of controlled coughing are: 1. Lean your head slightly forward. 2. Breathe in deeply using diaphragmatic breathing. 3. Try to hold your breath for 3 seconds. 4. Keep your mouth slightly open while coughing twice. 5. Spit any mucus out into a tissue. 6. Rest and repeat the steps once or twice as needed. General instructions  Make sure you receive all the vaccines that your health care provider recommends, especially the pneumococcal and influenza vaccines. Preventing infection and hospitalization is very  important when you have COPD.  Use oxygen therapy and pulmonary rehabilitation if directed to by your health care provider. If you require home oxygen therapy, ask your health care provider whether you should purchase a pulse oximeter to measure your oxygen level at home.  Work with your health care provider to develop a COPD action plan. This will help you know what steps to take if your condition gets worse.  Keep other chronic health conditions under control as told by your health care provider.  Avoid extreme temperature and humidity changes.  Avoid contact with people who have an illness that spreads from person to person (is contagious), such as viral infections or pneumonia.  Keep all follow-up visits as told by your health care provider. This is important. Contact a health care provider if:  You are coughing up more mucus than usual.  There is a change in the color or thickness of your mucus.  Your breathing is more labored than usual.  Your breathing is faster than usual.  You have difficulty sleeping.  You need to use your rescue medicines or inhalers more often than expected.  You have trouble doing routine activities such as getting dressed or walking around the house. Get help right away if:  You have shortness of breath while you are resting.  You have  shortness of breath that prevents you from: ? Being able to talk. ? Performing your usual physical activities.  You have chest pain lasting longer than 5 minutes.  Your skin color is more blue (cyanotic) than usual.  You measure low oxygen saturations for longer than 5 minutes with a pulse oximeter.  You have a fever.  You feel too tired to breathe normally. Summary  Chronic obstructive pulmonary disease (COPD) is a long-term (chronic) condition that affects the lungs.  Your lung function will probably never return to normal. In most cases, it gets worse over time. However, there are steps you can take to slow the progression of the disease and improve your quality of life.  Treatment for COPD may include taking medicines, quitting smoking, pulmonary rehabilitation, and changes to diet and exercise. As the disease progresses, you may need oxygen therapy, a lung transplant, or palliative care.  To help manage your condition, do not smoke, avoid exposure to things that irritate your lungs, stay up to date on all vaccines, and follow your health care provider's instructions for taking medicines. This information is not intended to replace advice given to you by your health care provider. Make sure you discuss any questions you have with your health care provider. Document Revised: 03/09/2017 Document Reviewed: 05/01/2016 Elsevier Patient Education  2020 [employer].  COPD and Physical Activity Chronic obstructive pulmonary disease (COPD) is a long-term (chronic) condition that affects the lungs. COPD is a general term that can be used to describe many different lung problems that cause lung swelling (inflammation) and limit airflow, including chronic bronchitis and emphysema. The main symptom of COPD is shortness of breath, which makes it harder to do even simple tasks. This can also make it harder to exercise and be active. Talk with your health care provider about treatments to help you  breathe better and actions you can take to prevent breathing problems during physical activity. What are the benefits of exercising with COPD? Exercising regularly is an important part of a healthy lifestyle. You can still exercise and do physical activities even though you have COPD. Exercise and physical activity improve your shortness of breath  by increasing blood flow (circulation). This causes your heart to pump more oxygen through your body. Moderate exercise can improve your:  Oxygen use.  Energy level.  Shortness of breath.  Strength in your breathing muscles.  Heart health.  Sleep.  Self-esteem and feelings of self-worth.  Depression, stress, and anxiety levels. Exercise can benefit everyone with COPD. The severity of your disease may affect how hard you can exercise, especially at first, but everyone can benefit. Talk with your health care provider about how much exercise is safe for you, and which activities and exercises are safe for you. What actions can I take to prevent breathing problems during physical activity?  Sign up for a pulmonary rehabilitation program. This type of program may include: ? Education about lung diseases. ? Exercise classes that teach you how to exercise and be more active while improving your breathing. This usually involves:  Exercise using your lower extremities, such as a stationary bicycle.  About 30 minutes of exercise, 2 to 5 times per week, for 6 to 12 weeks  Strength training, such as push ups or leg lifts. ? Nutrition education. ? Group classes in which you can talk with others who also have COPD and learn ways to manage stress.  If you use an oxygen tank, you should use it while you exercise. Work with your health care provider to adjust your oxygen for your physical activity. Your resting flow rate is different from your flow rate during physical activity.  While you are exercising: ? Take slow breaths. ? Pace yourself and do not  try to go too fast. ? Purse your lips while breathing out. Pursing your lips is similar to a kissing or whistling position. ? If doing exercise that uses a quick burst of effort, such as weight lifting:  Breathe in before starting the exercise.  Breathe out during the hardest part of the exercise (such as raising the weights). Where to find support You can find support for exercising with COPD from:  Your health care provider.  A pulmonary rehabilitation program.  Your local health department or community health programs.  Support groups, online or in-person. Your health care provider may be able to recommend support groups. Where to find more information You can find more information about exercising with COPD from:  American Lung Association: [place].  COPD Foundation: Almost[employer].gl. Contact a health care provider if:  Your symptoms get worse.  You have chest pain.  You have nausea.  You have a fever.  You have trouble talking or catching your breath.  You want to start a new exercise program or a new activity. Summary  COPD is a general term that can be used to describe many different lung problems that cause lung swelling (inflammation) and limit airflow. This includes chronic bronchitis and emphysema.  Exercise and physical activity improve your shortness of breath by increasing blood flow (circulation). This causes your heart to provide more oxygen to your body.  Contact your health care provider before starting any exercise program or new activity. Ask your health care provider what exercises and activities are safe for you. This information is not intended to replace advice given to you by your health care provider. Make sure you discuss any questions you have with your health care provider. Document Revised: 07/17/2018 Document Reviewed: 04/19/2017 Elsevier Patient Education  2020 [employer].

## 2019-10-17 NOTE — Progress Notes (Signed)
Patient being seen today for a follow-up. States he was recently in the hospital for heart issues and blood clots. Patient was seen in ED 08/28/19.   Patient also wanting to discuss treatment for seasonal allergies.

## 2019-10-17 NOTE — Progress Notes (Signed)
Chief Complaint  Patient presents with  . Follow-up  . Allergies  . 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  F/u [facility] cards sch 12/2019 and they rec medical management  2. C/o allergies worse with cutting grass and c/o sob, wheezing and yellow phelgm reviewed CTA chest 08/29/19 + severe emphysema. He has been smoking since age 64 y.o now 1/2 ppd but has done more in the past and has quit for 6 years in the past  He wants meds for allergies also rec smoking cessation  CTA 08/29/19 FINDINGS:   CHEST VASCULATURE   - THORACIC AORTA: Tricuspid aortic valve without leaflet calcifications or  thickening. Normal size of the aortic root, ascending aorta, and descending  thoracic aorta. No dissection or pseudoaneurysm. Mild to moderate scattered  calcified plaque. The aortic branches are patent with mild narrowing of the  left common carotid and left subclavian origins due to plaque.   ABDOMINAL VASCULATURE   - AORTO-IILIAC VASCULATURE: The suprarenal abdominal aorta is normal in  caliber with moderate calcified and noncalcified plaque. Stable  postsurgical change status post open graft repair of the infrarenal  abdominal aorta and common iliacs. The aorta measures up to measures 2.6 x  2.6 cm at the level of the IMA takeoff with moderate circumferential mural  plaque. 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 The proximal left  external iliac aneurysm measures up to 2.0 x 1.9 cm, unchanged. 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 The  distal external iliacs are mildly ectatic. No dissection. Severe mixed  calcified and noncalcified plaque of the distal aorta and iliacs. Resulting  long segment occlusion of the proximal right internal iliac and high-grade  narrowing of the origin of the left internal iliac.   - MESENTERIC ARTERIES: Moderate ostial calcification without stenosis in  the celiac or SMA. Replaced left hepatic artery arising from left gastric  artery. Moderate narrowing of the origin of the IMA.   - RIGHT RENAL ARTERY: Single renal artery. Mild calcified plaque. No focal  narrowing.  - LEFT RENAL ARTERY: Single renal artery. Mild calcified plaque. No focal  narrowing.   - RIGHT FEMORAL ARTERIES: Moderate mixed plaque. Tiny focal dissection  versus linear eccentric atherosclerotic plaque within the proximal right  superficial femoral artery (series 9, image 995).  - LEFT FEMORAL ARTERIES: Distal left common femoral artery saccular  aneurysm measuring up to 9 mm (series 9, image 923). Moderate mixed plaque  in the common, superficial, and profunda femoral arteries without  significant stenosis.    NON-VASCULAR FINDINGS  **Evaluation of the solid organs and vasculature is limited due to early  arterial phase contrast.   CHEST   - TRACHEA: Trace debris in the trachea.  - LUNGS AND PLEURA: Severe paraseptal and moderate centrilobular emphysema  with large bulla in the anterior upper lobes. Mild subpleural reticulation  suggesting fibrosis. Scattered pulmonary nodules measuring2-3 mm, possibly  sequela of prior infection. A representative nodule can be seen in the left  lower lobe (series 9, image 311) and right upper lobe (series 9, image  180).   - NECK BASE: Unremarkable.  - THYROID: Unremarkable.  - AXILLA: No lymphadenopathy.   - MEDIASTINUM/HILA: No lymphadenopathy.  - HEART: Cardiomegaly. Survey focal thinning involving the mid to apical  lateral  inferior segments consistent with sequelae of remote circumflex  territory infarct. No definite left ventricular thrombus. Ssevere  three-vessel coronary artery calcifications. Status post RCA, LCx, and LAD  stents. No pericardial effusion. Qualitatively normal atrial sizes. No left  atrial filling defect. Left chest wall AICD with lead terminating in the  right ventricular apex.  - PULMONARY ARTERIES: Normal in caliber.    ABDOMEN/PELVIS   - LIVER: Normal contour. Scattered ill-defined foci of arterial enhancement  without correlate on delayed phase images likely perfusional. No suspicious  hepatic lesions.  - GALLBLADDER: Nondistended. No biliary dilatation.   - SPLEEN: Unremarkable.  - PANCREAS: Unremarkable.  - ADRENALS: Unremarkable.   - KIDNEYS: No hydronephrosis. Bilateral renal hypodensities, the larger of  which are compatible with cysts, although the smaller of which are too  small to characterize. Bilateral lower pole nonobstructive renal calculi,  left larger than right.  - BLADDER: Unremarkable.  - REPRODUCTIVE: Mildly enlarged prostate.   - BOWEL: Diverticulosis without evidence of diverticulitis.No bowel  dilatation. Normal appendix.  - PERITONEUM/RETROPERITONEUM: No lymphadenopathy. No free fluid or free  air.   - BONES and SOFT TISSUES: Status post left femoral nail. Chronic  posttraumatic deformity involving multiple left lower posterior ribs.    IMPRESSION:   1. Stable postoperative change status post open aortobiiliac graft repair.  No acute aortic pathology.   2. Likely slight interval increase in size of left external iliac artery  aneurysm from 2016, as above. Otherwise, aneurysmsof the left external and  femoral arteries are not significantly changed.   3. Stable occlusion of the proximal right internal iliac and severe  narrowing of the proximal left internal iliac secondary to mixed plaque.   4. Severe emphysema with large upper lobe bullae.  Scattered sub-4 mm  pulmonary nodules. Recommend attention on one year chest CT follow-up.   5. Severe three-vessel coronary artery calcifications status post  three-vessel stent placement. Sequelae of remote circumflex territory  myocardial infarction.      3. BPH est urology proscar 5 and flomax 0.4 are helping his bph sx's and being able to urinate  4. Shoulder pain better with rotator cuff/arthritis noted Xrays 07/2019 with ortho and cervical arthritis he is disc. Further managmeent with pain clinic in [place]   Review of Systems  Constitutional: Negative for weight loss.  HENT: Negative for hearing loss.   Eyes: Negative for blurred vision.  Respiratory: Positive for cough, sputum production, shortness of breath and wheezing.        Sob with exertion    Cardiovascular: Negative for chest pain.  Musculoskeletal: Negative for falls.  Skin: Negative for rash.  Endo/Heme/Allergies: Positive for environmental allergies.  Psychiatric/Behavioral: Negative for depression.   Past Medical History:  Diagnosis Date  . Allergy   . CAD (coronary artery disease)   . Depression   . Frequent headaches   . GERD (gastroesophageal reflux disease)   . Headache   . Hyperlipidemia   . Hypertension   . PAD (peripheral artery disease) (HCC)   . Tobacco abuse    Past Surgical History:  Procedure Laterality Date  . COLONOSCOPY WITH PROPOFOL N/A 06/20/2017   Procedure: COLONOSCOPY WITH PROPOFOL;  Surgeon: Wyline Mood, MD;  Location: [facility] ENDOSCOPY;  Service: Gastroenterology;  Laterality: N/A;  . COLONOSCOPY WITH PROPOFOL N/A 07/24/2017   Procedure: COLONOSCOPY WITH PROPOFOL;  Surgeon: Wyline Mood, MD;  Location: [facility] ENDOSCOPY;  Service: Gastroenterology;  Laterality: N/A;  . ESOPHAGOGASTRODUODENOSCOPY (EGD) WITH PROPOFOL N/A 06/20/2017   Procedure: ESOPHAGOGASTRODUODENOSCOPY (EGD) WITH PROPOFOL;  Surgeon: Wyline Mood, MD;  Location: [facility] ENDOSCOPY;  Service: Gastroenterology;  Laterality: N/A;  .  PERCUTANEOUS CORONARY STENT INTERVENTION (PCI-S)    . STOMACH SURGERY     2009 [facility] per chart review pt had AAA repair   . TONSILLECTOMY     Family History  Problem Relation Age of Onset  . Diabetes Mother   . Heart disease Mother   . Hypertension Mother   . Stroke Mother   . Prostate cancer Father   . Cancer Father        prostate cancer in 38s  . Diabetes Sister   . Heart disease Sister   . Ulcers Other        unknown who had ulcers in family    Social History   Socioeconomic History  . Marital status: Married    Spouse name: Not on file  . Number of children: Not on file  . Years of education: Not on file  . Highest education level: Not on file  Occupational History  . Not on file  Tobacco Use  . Smoking status: Current Every Day Smoker    Packs/day: 0.25    Types: Cigarettes  . Smokeless tobacco: Never Used  Vaping Use  . Vaping Use: Never used  Substance and Sexual Activity  . Alcohol use: No    Alcohol/week: 0.0 standard drinks    Comment: quit 1 year ago  . Drug use: No  . Sexual activity: Not Currently  Other Topics Concern  . Not on file  Social History Narrative   Disabled    4 kids 2 living    Used to do mill work    Smoker since age 45 max 2 pk per week now 1 ppd as of 02/2017    Married    Social Determinants of Corporate investment banker Strain:   . Difficulty of Paying Living Expenses:   Food Insecurity:   . Worried About Programme researcher, broadcasting/film/video in the Last Year:   . Barista in the Last Year:   Transportation Needs:   . Freight forwarder (Medical):   Marland Kitchen Lack of Transportation (Non-Medical):   Physical Activity:   . Days of Exercise per Week:   . Minutes of Exercise per Session:   Stress:   . Feeling of Stress :   Social Connections:   . Frequency of Communication with Friends and Family:   . Frequency of Social Gatherings with Friends and Family:   . Attends Religious Services:   . Active Member of Clubs or Organizations:    . Attends Banker Meetings:   Marland Kitchen Marital Status:   Intimate Partner Violence:   . Fear of Current or Ex-Partner:   . Emotionally Abused:   Marland Kitchen Physically Abused:   . Sexually Abused:    Current Meds  Medication Sig  . amLODipine (NORVASC) 2.5 MG tablet Take 1 tablet by mouth daily.  Marland Kitchen aspirin 81 MG tablet Take 81 mg by mouth daily.  Marland Kitchen atorvastatin (LIPITOR) 80 MG tablet Take 1 tablet (80 mg total) by mouth daily at 6 PM.  . Baclofen 5 MG TABS Take 1 tablet by mouth  2 (two) times daily.  . Cholecalciferol 1.25 MG (50000 UT) capsule Take 1 capsule (50,000 Units total) by mouth once a week.  . clopidogrel (PLAVIX) 75 MG tablet 1 pill daily  . cyanocobalamin 100 MCG tablet Take 1 tablet by mouth daily.  . finasteride (PROSCAR) 5 MG tablet Take 1 tablet (5 mg total) by mouth daily.  . fluticasone (FLONASE) 50 MCG/ACT nasal spray Place 1-2 sprays into both nostrils daily.  Marland Kitchen gabapentin (NEURONTIN) 300 MG capsule Take 1 capsule (300 mg total) by mouth 2 (two) times daily.  Marland Kitchen ipratropium (ATROVENT) 0.02 % nebulizer solution Inhale into the lungs.  . isosorbide mononitrate (IMDUR) 60 MG 24 hr tablet Take 1 tablet by mouth in the morning, at noon, and at bedtime.  Marland Kitchen lisinopril (ZESTRIL) 5 MG tablet 1 pill daily  . metoprolol succinate (TOPROL-XL) 25 MG 24 hr tablet Take 1 tablet (25 mg total) by mouth daily.  . mirtazapine (REMERON) 30 MG tablet 1 pill nightly  . nitroGLYCERIN (NITROSTAT) 0.4 MG SL tablet Place 1 tablet (0.4 mg total) under the tongue every 5 (five) minutes as needed for chest pain.  Marland Kitchen oxyCODONE-acetaminophen (PERCOCET) 10-325 MG tablet   . pantoprazole (PROTONIX) 40 MG tablet Take 1 tablet (40 mg total) by mouth daily. 30 minutes before food  . pyridOXINE (VITAMIN B-6) 100 MG tablet Take 1 tablet (100 mg total) by mouth daily.  . sildenafil (REVATIO) 20 MG tablet 1-5 pills as needed before sex 30 minutes to 4 hours  . tamsulosin (FLOMAX) 0.4 MG CAPS capsule Take 2  capsules (0.8 mg total) by mouth daily after supper.  . zolpidem (AMBIEN) 10 MG tablet Take 1 tablet (10 mg total) by mouth at bedtime as needed for sleep.   No Known Allergies Recent Results (from the past 2160 hour(s))  Vitamin D (25 hydroxy)     Status: None   Collection Time: 08/28/19  8:06 AM  Result Value Ref Range   VITD 97.61 30.00 - 100.00 ng/mL  Hepatitis B surface antibody,quantitative     Status: Abnormal   Collection Time: 08/28/19  8:06 AM  Result Value Ref Range   Hepatitis B-Post <5 (L) > OR = 10 mIU/mL    Comment: . Patient does not have immunity to hepatitis B virus. . For additional information, please refer to http://education.questdiagn[facility].com/faq/FAQ105 (This link is being provided for informational/ educational purposes only).   CBC w/Diff     Status: Abnormal   Collection Time: 08/28/19  8:06 AM  Result Value Ref Range   WBC 5.2 4.0 - 10.5 K/uL   RBC 4.42 4.22 - 5.81 Mil/uL   Hemoglobin 13.7 13.0 - 17.0 g/dL   HCT 63.3 39 - 52 %   MCV 94.6 78.0 - 100.0 fl   MCHC 32.6 30.0 - 36.0 g/dL   RDW 35.4 56.2 - 56.3 %   Platelets 275.0 150 - 400 K/uL   Neutrophils Relative % 49.3 43 - 77 %   Lymphocytes Relative 34.7 12 - 46 %   Monocytes Relative 14.6 (H) 3 - 12 %   Eosinophils Relative 0.9 0 - 5 %   Basophils Relative 0.5 0 - 3 %   Neutro Abs 2.5 1.4 - 7.7 K/uL   Lymphs Abs 1.8 0.7 - 4.0 K/uL   Monocytes Absolute 0.8 0 - 1 K/uL   Eosinophils Absolute 0.0 0 - 0 K/uL   Basophils Absolute 0.0 0 - 0 K/uL  Lipid panel     Status: Abnormal  Collection Time: 08/28/19  8:06 AM  Result Value Ref Range   Cholesterol 104 0 - 200 mg/dL    Comment: ATP III Classification       Desirable:  < 200 mg/dL               Borderline High:  200 - 239 mg/dL          High:  > = 053 mg/dL   Triglycerides 97.6 0 - 149 mg/dL    Comment: Normal:  <734 mg/dLBorderline High:  150 - 199 mg/dL   HDL 19.37 (L) >90.24 mg/dL   VLDL 09.7 0.0 - 35.3 mg/dL   LDL Cholesterol 54 0 - 99  mg/dL   Total CHOL/HDL Ratio 3     Comment:                Men          Women1/2 Average Risk     3.4          3.3Average Risk          5.0          4.42X Average Risk          9.6          7.13X Average Risk          15.0          11.0                       NonHDL 65.38     Comment: NOTE:  Non-HDL goal should be 30 mg/dL higher than patient's LDL goal (i.e. LDL goal of < 70 mg/dL, would have non-HDL goal of < 100 mg/dL)  Comprehensive metabolic panel     Status: Abnormal   Collection Time: 08/28/19  8:06 AM  Result Value Ref Range   Sodium 139 135 - 145 mEq/L   Potassium 3.6 3.5 - 5.1 mEq/L   Chloride 105 96 - 112 mEq/L   CO2 26 19 - 32 mEq/L   Glucose, Bld 66 (L) 70 - 99 mg/dL   BUN 23 6 - 23 mg/dL   Creatinine, Ser 2.99 0.40 - 1.50 mg/dL   Total Bilirubin 0.2 0.2 - 1.2 mg/dL   Alkaline Phosphatase 42 39 - 117 U/L   AST 21 0 - 37 U/L   ALT 16 0 - 53 U/L   Total Protein 7.0 6.0 - 8.3 g/dL   Albumin 4.4 3.5 - 5.2 g/dL   GFR 242.68 >34.19 mL/min   Calcium 9.5 8.4 - 10.5 mg/dL  TSH     Status: None   Collection Time: 08/28/19  8:06 AM  Result Value Ref Range   TSH 1.67 0.35 - 4.50 uIU/mL  Hemoglobin A1c     Status: None   Collection Time: 08/28/19  8:06 AM  Result Value Ref Range   Hgb A1c MFr Bld 6.4 4.6 - 6.5 %    Comment: Glycemic Control Guidelines for People with Diabetes:Non Diabetic:  <6%Goal of Therapy: <7%Additional Action Suggested:  >8%   BLADDER SCAN AMB NON-IMAGING     Status: None   Collection Time: 09/10/19  2:19 PM  Result Value Ref Range   Scan Result 82   Urinalysis, Complete     Status: Abnormal   Collection Time: 09/10/19  2:50 PM  Result Value Ref Range   Specific Gravity, UA 1.025 1.005 - 1.030   pH, UA 5.0 5.0 - 7.5   Color, UA  Yellow Yellow   Appearance Ur Clear Clear   Leukocytes,UA Negative Negative   Protein,UA Negative Negative/Trace   Glucose, UA Negative Negative   Ketones, UA Trace (A) Negative   RBC, UA Trace (A) Negative   Bilirubin, UA  Negative Negative   Urobilinogen, Ur 0.2 0.2 - 1.0 mg/dL   Nitrite, UA Negative Negative   Microscopic Examination See below:   Microscopic Examination     Status: Abnormal   Collection Time: 09/10/19  2:50 PM   URINE  Result Value Ref Range   WBC, UA 0-5 0 - 5 /hpf   RBC 3-10 (A) 0 - 2 /hpf   Epithelial Cells (non renal) 0-10 0 - 10 /hpf   Casts Present (A) None seen /lpf   Cast Type Hyaline casts N/A   Mucus, UA Present (A) Not Estab.   Bacteria, UA None seen None seen/Few  CULTURE, URINE COMPREHENSIVE     Status: None   Collection Time: 09/10/19  3:36 PM   Specimen: Urine   UR  Result Value Ref Range   Urine Culture, Comprehensive Final report    Organism ID, Bacteria Comment     Comment: Mixed urogenital flora 8,000  Colonies/mL   Urinalysis, Complete     Status: Abnormal   Collection Time: 09/18/19 10:49 AM  Result Value Ref Range   Specific Gravity, UA 1.025 1.005 - 1.030   pH, UA 5.0 5.0 - 7.5   Color, UA Yellow Yellow   Appearance Ur Clear Clear   Leukocytes,UA Negative Negative   Protein,UA Negative Negative/Trace   Glucose, UA Negative Negative   Ketones, UA Trace (A) Negative   RBC, UA 2+ (A) Negative   Bilirubin, UA Negative Negative   Urobilinogen, Ur 0.2 0.2 - 1.0 mg/dL   Nitrite, UA Negative Negative   Microscopic Examination See below:   Microscopic Examination     Status: Abnormal   Collection Time: 09/18/19 10:49 AM   Urine  Result Value Ref Range   WBC, UA 0-5 0 - 5 /hpf   RBC 3-10 (A) 0 - 2 /hpf   Epithelial Cells (non renal) 0-10 0 - 10 /hpf   Casts Present (A) None seen /lpf   Cast Type Hyaline casts N/A   Bacteria, UA None seen None seen/Few   Objective  Body mass index is 19.17 kg/m. Wt Readings from Last 3 Encounters:  10/17/19 129 lb 12.8 oz (58.9 kg)  09/18/19 129 lb (58.5 kg)  09/10/19 129 lb (58.5 kg)   Temp Readings from Last 3 Encounters:  10/17/19 98.3 F (36.8 C) (Oral)  06/27/19 (!) 97.2 F (36.2 C) (Temporal)   02/26/19 (!) 97.2 F (36.2 C) (Temporal)   BP Readings from Last 3 Encounters:  10/17/19 106/70  09/18/19 104/68  09/10/19 94/61   Pulse Readings from Last 3 Encounters:  10/17/19 86  09/18/19 77  09/10/19 85    Physical Exam Vitals and nursing note reviewed.  Constitutional:      Appearance: Normal appearance. He is well-developed and well-groomed.  HENT:     Head: Normocephalic and atraumatic.  Eyes:     Conjunctiva/sclera: Conjunctivae normal.     Pupils: Pupils are equal, round, and reactive to light.  Cardiovascular:     Rate and Rhythm: Normal rate and regular rhythm.     Heart sounds: Normal heart sounds. No murmur heard.   Pulmonary:     Effort: Pulmonary effort is normal.     Breath sounds: Normal breath sounds. No wheezing.  Skin:    General: Skin is warm and dry.  Neurological:     General: No focal deficit present.     Mental Status: He is alert and oriented to person, place, and time. Mental status is at baseline.     Gait: Gait normal.  Psychiatric:        Attention and Perception: Attention and perception normal.        Mood and Affect: Mood and affect normal.        Speech: Speech normal.        Behavior: Behavior normal. Behavior is cooperative.        Thought Content: Thought content normal.        Cognition and Memory: Cognition and memory normal.        Judgment: Judgment normal.     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 with lung nodules noted 08/2019 imaging chest- Plan: nicotine (NICODERM CQ - DOSED IN MG/24 HOURS) 21 mg/24hr patch, nicotine (NICODERM CQ - DOSED IN MG/24 HOURS) 14 mg/24hr patch, nicotine (NICODERM CQ - DOSED IN MG/24 HR) 7 mg/24hr patch, Dextromethorphan-guaiFENesin (MUCINEX DM  MAXIMUM STRENGTH) 60-1200 MG TB12, albuterol (VENTOLIN HFA) 108 (90 Base) MCG/ACT inhaler rec smoking cessation 1/2 ppd smoker since age 44 y.o   Allergic rhinitis, unspecified seasonality, unspecified trigger - Plan: levocetirizine (XYZAL) 5 MG tablet, sodium chloride (OCEAN) 0.65 % SOLN nasal spray  Benign prostatic hyperplasia with urinary retention Cont meds helping  PVD (peripheral vascular disease) (HCC) Atherosclerosis  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 f/u with [facility] 11/05/19  HM Declines flu shot Had3/3 doses hep B check titerin futureconsider Will disc Tdap and shingrix in future moderna had 06/18/19 and 2nd dose utd need card proof  PSA had11/20/20 2.19 normal DRE in futurecheck PSA upcoming 02/2020 Hep C negative -h/o hematuria CT 08/2017 with left kidney6 mmstone could be etiologyand CT 04/2019 with b/l nonobstructing kidney stones on CT L spine HIV neg 03/29/15  Colonoscopy 3 and 07/2017 hadpoor prep diverticulosis  -f/u GI 03/05/18 will ask if will order CT liver protocol for liver lesion -of note will [facility] never had CT ab/pelvis sch 11 or 03/2018 -per pts wife cardiology does not want further procedures for now and especially without their approvalpreviously and as of 02/26/19 no $ to f/u GI  Eye glasses best in [place] cataracts b/l eyes glasses help seen in 2020/2021  [facility] cardiology  Urology Dr. Richardo Hanks  Provider: Dr. French Ana McLean-Scocuzza-Internal Medicine

## 2019-10-26 IMAGING — CT CT L SPINE W/ CM
3 of 4 series · 12 of 33 positions shown, 14 images · IV contrast (omnipaque)
Comparison: 04/18/2018

CLINICAL DATA: Continued back pain and fever following an injection
last week. Resolved leukocytosis.

EXAM:
CT LUMBAR SPINE WITH CONTRAST
TECHNIQUE: Multidetector CT imaging of the lumbar spine was performed with
intravenous contrast administration.
CONTRAST:  100mL OMNIPAQUE IOHEXOL 300 MG/ML  SOLN

[Series 9: coronal st · coronal · 0.34mm/px · 3 of 60 slices shown]
[im 12/60  bone]
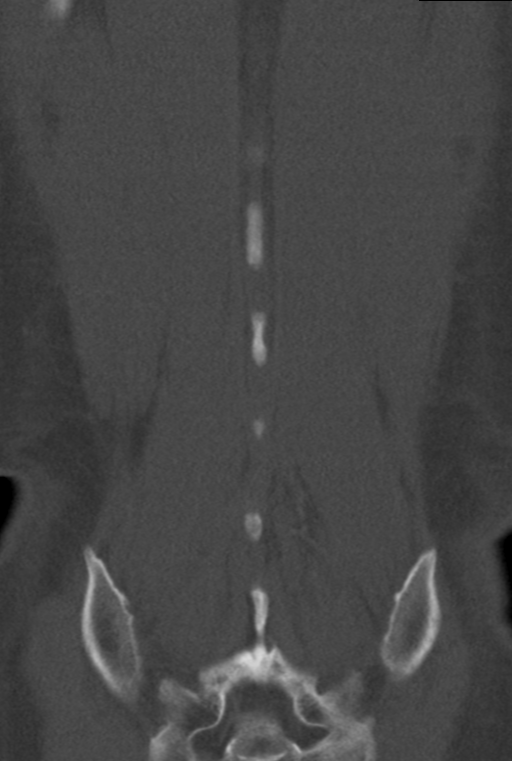
[im 24/60  bone]
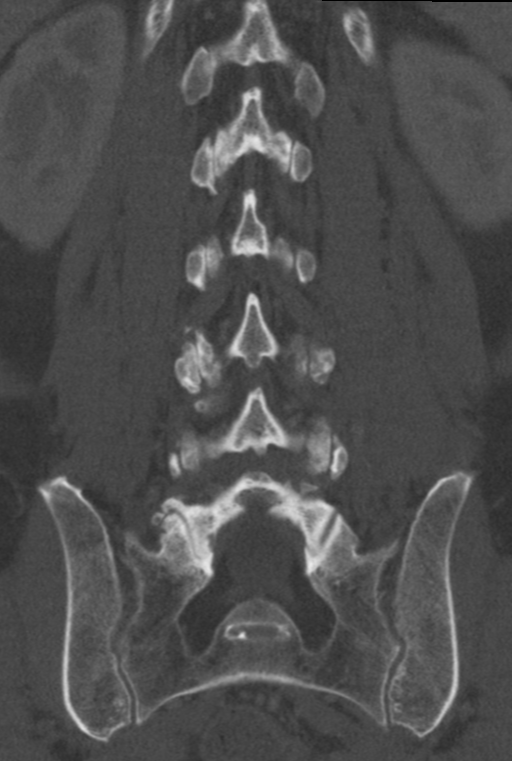
[im 36/60  bone]
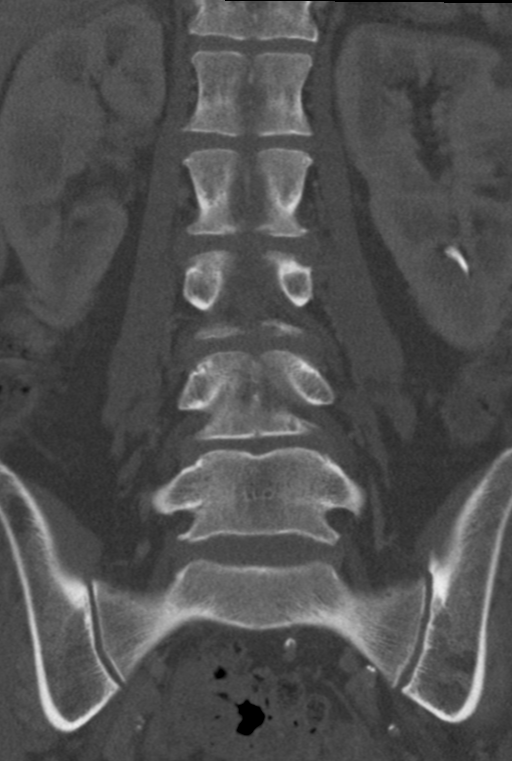

[Series 10: sagittal st · sagittal · 0.30mm/px · 5 of 88 slices shown, 6 images]
[im 30/88  bone]
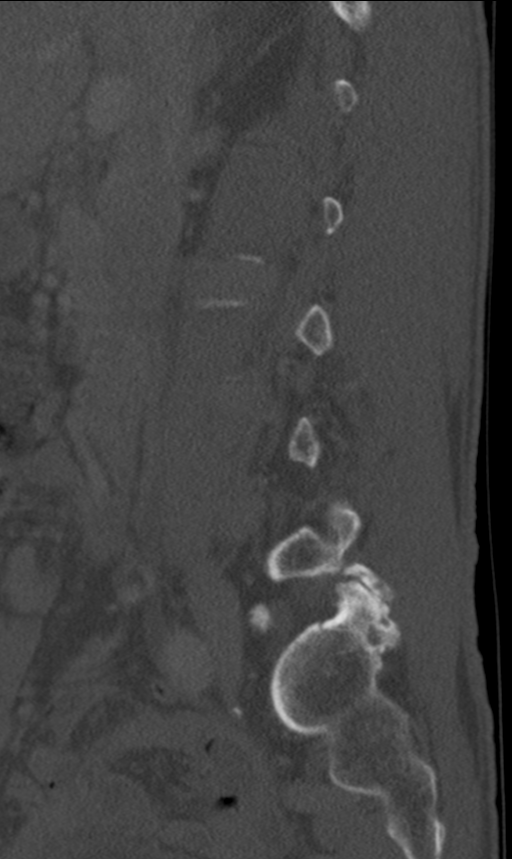
[im 37/88  bone]
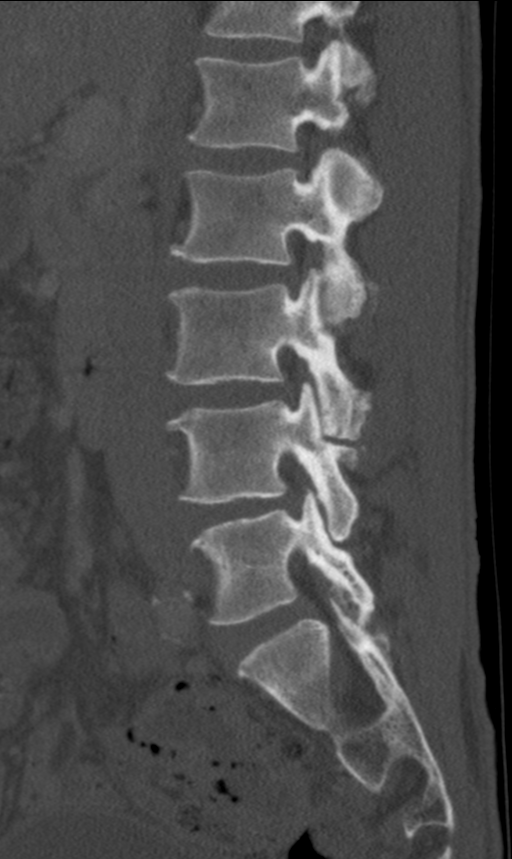
[im 44/88  soft-tissue]
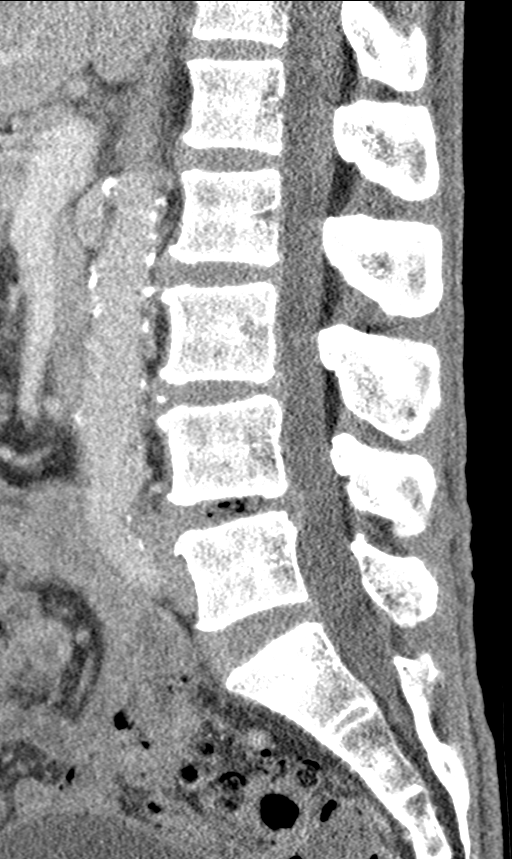
[im 44/88  bone]
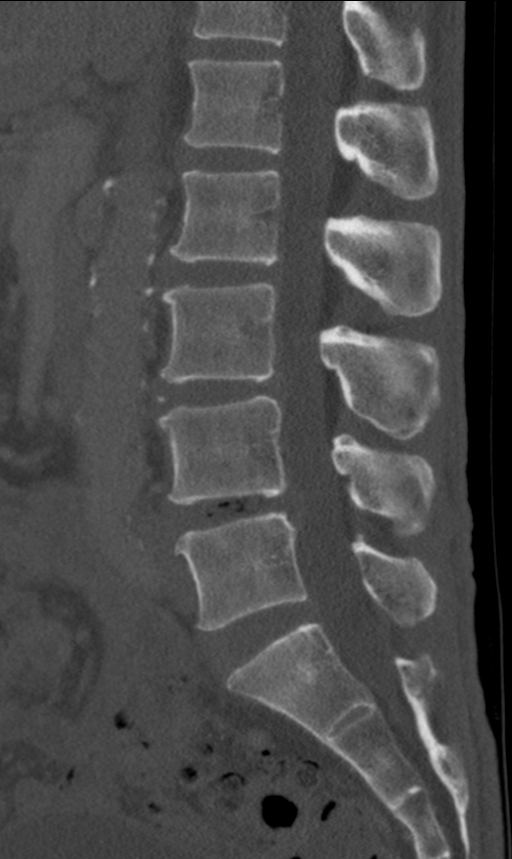
[im 51/88  bone]
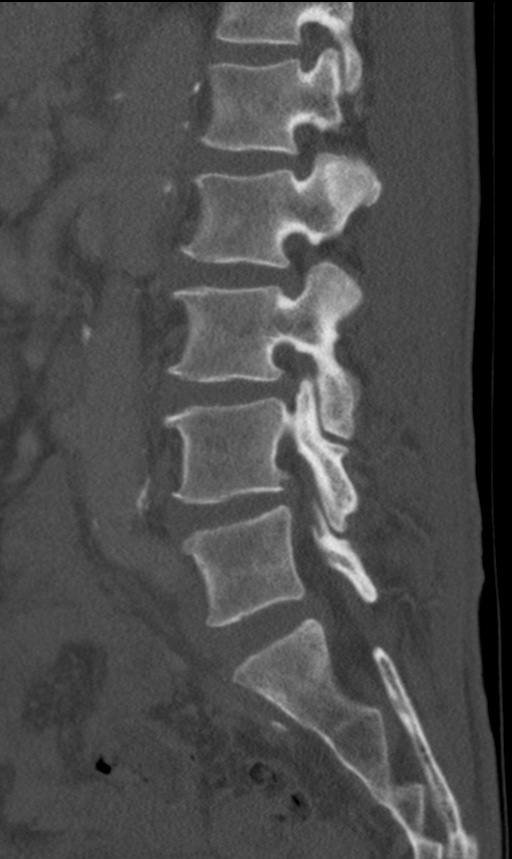
[im 59/88  bone]
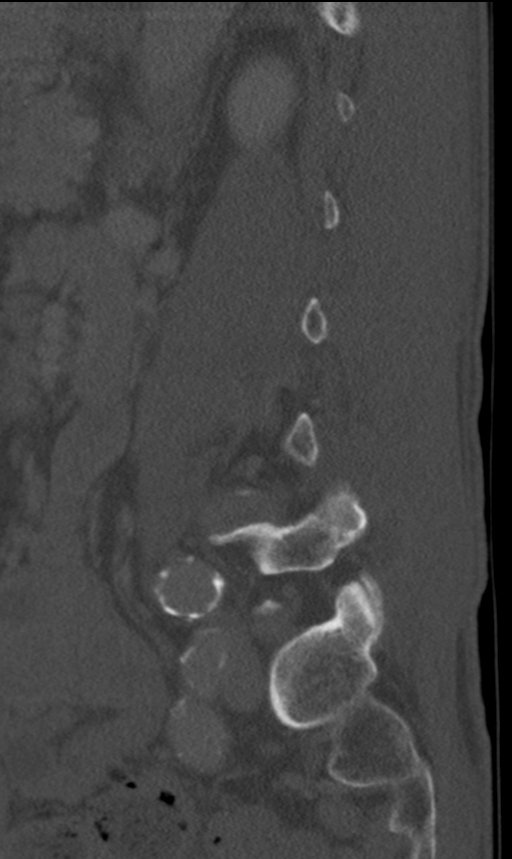

[Series 11: multi disc · axial · 0.21mm/px · z∈[-856,-717]mm · 4 of 105 slices shown, 5 images]
[im 21/105  soft-tissue]
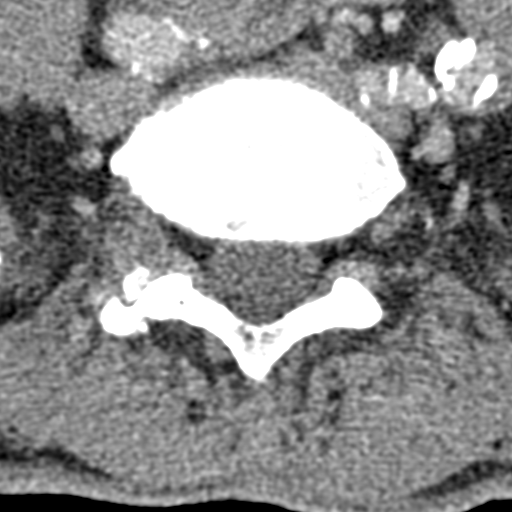
[im 21/105  bone]
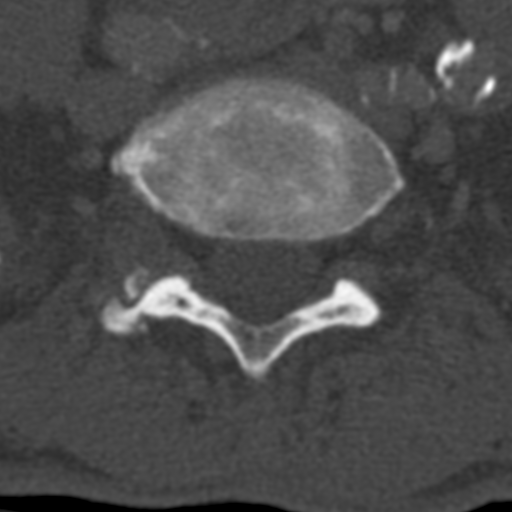
[im 42/105  bone]
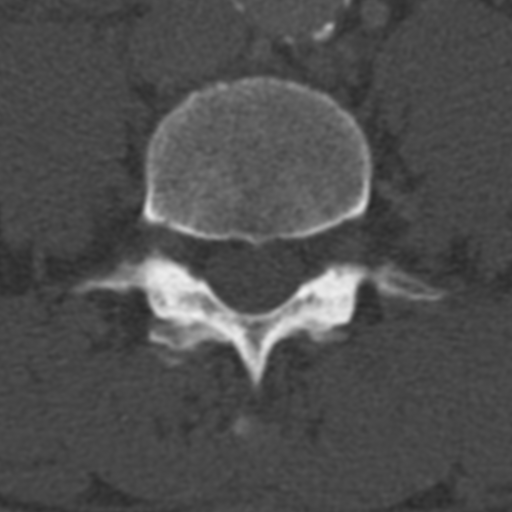
[im 63/105  bone]
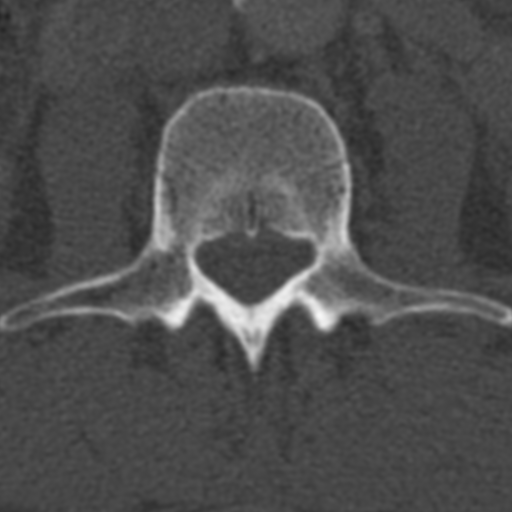
[im 84/105  bone]
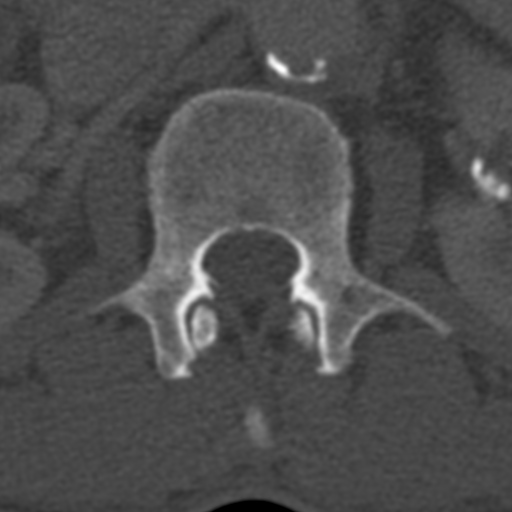

[12 of 33 positions shown; findings below may reference images not displayed]

FINDINGS: Segmentation: Standard.

Alignment: Mild chronic straightening of the lumbar spine. No
significant listhesis.

Vertebrae: No acute fracture or suspicious osseous lesion. No
interval endplate erosion or other aggressive osseous changes to
suggest discitis-osteomyelitis. Small lucencies in the posterior
left ilium, chronic based on a 2667 CT and likely benign.

Paraspinal and other soft tissues: No paraspinal fluid collection or
significant inflammatory changes. Left larger than right
nonobstructing renal calculi. Partially visualize small low-density
left renal lesions, likely cysts. Aortic atherosclerosis with
ectasia of the infrarenal abdominal aorta and with left common and
left internal iliac artery aneurysms as described on the recent
prior CT. Colonic diverticulosis.

Disc levels: Unchanged appearance of disc and facet degeneration
resulting in up to moderate neural foraminal stenosis as described
in detail on the recent prior CT. No high-grade spinal stenosis.
IMPRESSION: 1. No evidence of acute infection or other acute osseous abnormality
in the lumbar spine.
2. Unchanged appearance of disc and facet degeneration compared to
the recent prior CT.
3. Nonobstructing bilateral nephrolithiasis.
4.  Aortic Atherosclerosis ([id number]).

## 2019-11-04 DIAGNOSIS — Z95810 Presence of automatic (implantable) cardiac defibrillator: Secondary | ICD-10-CM | POA: Diagnosis not present

## 2019-11-12 DIAGNOSIS — M47817 Spondylosis without myelopathy or radiculopathy, lumbosacral region: Secondary | ICD-10-CM | POA: Diagnosis not present

## 2019-11-12 DIAGNOSIS — M79604 Pain in right leg: Secondary | ICD-10-CM | POA: Diagnosis not present

## 2019-11-12 DIAGNOSIS — M5136 Other intervertebral disc degeneration, lumbar region: Secondary | ICD-10-CM | POA: Diagnosis not present

## 2019-11-12 DIAGNOSIS — G894 Chronic pain syndrome: Secondary | ICD-10-CM | POA: Diagnosis not present

## 2019-11-25 DIAGNOSIS — Z95810 Presence of automatic (implantable) cardiac defibrillator: Secondary | ICD-10-CM | POA: Diagnosis not present

## 2019-12-15 ENCOUNTER — Other Ambulatory Visit: Payer: Self-pay | Admitting: Internal Medicine

## 2019-12-15 DIAGNOSIS — I2510 Atherosclerotic heart disease of native coronary artery without angina pectoris: Secondary | ICD-10-CM

## 2019-12-15 DIAGNOSIS — K2970 Gastritis, unspecified, without bleeding: Secondary | ICD-10-CM

## 2019-12-15 DIAGNOSIS — F32A Depression, unspecified: Secondary | ICD-10-CM

## 2019-12-15 DIAGNOSIS — G4700 Insomnia, unspecified: Secondary | ICD-10-CM

## 2019-12-15 MED ORDER — MIRTAZAPINE 30 MG PO TABS: ORAL_TABLET | ORAL | 3 refills | Status: DC

## 2019-12-15 MED ORDER — PANTOPRAZOLE SODIUM 40 MG PO TBEC: 40.0000 mg | DELAYED_RELEASE_TABLET | Freq: Every day | ORAL | 3 refills | Status: DC

## 2019-12-15 MED ORDER — METOPROLOL SUCCINATE ER 25 MG PO TB24: 25.0000 mg | ORAL_TABLET | Freq: Every day | ORAL | 3 refills | Status: DC

## 2019-12-17 DIAGNOSIS — M542 Cervicalgia: Secondary | ICD-10-CM | POA: Diagnosis not present

## 2019-12-17 DIAGNOSIS — G894 Chronic pain syndrome: Secondary | ICD-10-CM | POA: Diagnosis not present

## 2019-12-17 DIAGNOSIS — Z79899 Other long term (current) drug therapy: Secondary | ICD-10-CM | POA: Diagnosis not present

## 2019-12-17 DIAGNOSIS — Z79891 Long term (current) use of opiate analgesic: Secondary | ICD-10-CM | POA: Diagnosis not present

## 2019-12-17 DIAGNOSIS — M47817 Spondylosis without myelopathy or radiculopathy, lumbosacral region: Secondary | ICD-10-CM | POA: Diagnosis not present

## 2019-12-17 DIAGNOSIS — M5136 Other intervertebral disc degeneration, lumbar region: Secondary | ICD-10-CM | POA: Diagnosis not present

## 2019-12-18 ENCOUNTER — Other Ambulatory Visit: Payer: Self-pay | Admitting: Pain Medicine

## 2019-12-18 DIAGNOSIS — M542 Cervicalgia: Secondary | ICD-10-CM

## 2019-12-18 DIAGNOSIS — G8929 Other chronic pain: Secondary | ICD-10-CM

## 2019-12-25 ENCOUNTER — Ambulatory Visit: Payer: Medicare Other

## 2019-12-31 DIAGNOSIS — Z95810 Presence of automatic (implantable) cardiac defibrillator: Secondary | ICD-10-CM | POA: Diagnosis not present

## 2020-01-05 ENCOUNTER — Ambulatory Visit
Admission: RE | Admit: 2020-01-05 | Discharge: 2020-01-05 | Disposition: A | Discharge status: Home / Self Care | Payer: Medicare Other | Source: Ambulatory Visit | Attending: Pain Medicine | Admitting: Pain Medicine

## 2020-01-05 ENCOUNTER — Other Ambulatory Visit: Payer: Self-pay

## 2020-01-05 DIAGNOSIS — G8929 Other chronic pain: Secondary | ICD-10-CM | POA: Diagnosis not present

## 2020-01-05 DIAGNOSIS — M542 Cervicalgia: Secondary | ICD-10-CM | POA: Insufficient documentation

## 2020-01-05 DIAGNOSIS — M25519 Pain in unspecified shoulder: Secondary | ICD-10-CM | POA: Diagnosis not present

## 2020-01-16 DIAGNOSIS — M5136 Other intervertebral disc degeneration, lumbar region: Secondary | ICD-10-CM | POA: Diagnosis not present

## 2020-01-16 DIAGNOSIS — G894 Chronic pain syndrome: Secondary | ICD-10-CM | POA: Diagnosis not present

## 2020-01-16 DIAGNOSIS — M47817 Spondylosis without myelopathy or radiculopathy, lumbosacral region: Secondary | ICD-10-CM | POA: Diagnosis not present

## 2020-01-16 DIAGNOSIS — M5030 Other cervical disc degeneration, unspecified cervical region: Secondary | ICD-10-CM | POA: Diagnosis not present

## 2020-01-19 ENCOUNTER — Telehealth: Payer: Self-pay | Admitting: Internal Medicine

## 2020-01-19 DIAGNOSIS — I2510 Atherosclerotic heart disease of native coronary artery without angina pectoris: Secondary | ICD-10-CM | POA: Diagnosis not present

## 2020-01-19 DIAGNOSIS — I714 Abdominal aortic aneurysm, without rupture: Secondary | ICD-10-CM | POA: Diagnosis not present

## 2020-01-19 DIAGNOSIS — Z23 Encounter for immunization: Secondary | ICD-10-CM | POA: Diagnosis not present

## 2020-01-19 DIAGNOSIS — R42 Dizziness and giddiness: Secondary | ICD-10-CM | POA: Diagnosis not present

## 2020-01-19 NOTE — Telephone Encounter (Signed)
Please see kathys telephone note aswell.

## 2020-01-19 NOTE — Telephone Encounter (Signed)
Patient called cardiologist and they are trying to work him in, says  His legs feel like ruber, weak, dizzy, feels to him his heart is beating slow started about 2 weeks ago, cannot do things anymore.  No recent changes in medication. Has taken all medications this AM. Patient medication comes in pill pack. 111/80 pulse 83 at this time. Called cardiologist at [facility] awaiting call back for work in appointment.

## 2020-01-19 NOTE — Telephone Encounter (Signed)
Access Nurse didn't pick up Transferred to [facility]   Pt called stating that he is dizzy and his legs are weak and is not feeling like himself

## 2020-01-19 NOTE — Telephone Encounter (Signed)
Called patient he has appointment and on his way to cardiologist.

## 2020-01-19 NOTE — Telephone Encounter (Signed)
I also advised pt via Martin Hernandez to consider going to [facility] urgent care or ED asap Thanks TMS

## 2020-01-19 NOTE — Telephone Encounter (Signed)
Noted  Dr. TMS 

## 2020-01-19 NOTE — Telephone Encounter (Signed)
Patient has been seen with these sx numerous time by Dr French Ana. Same SX as before. Please advise.

## 2020-01-19 NOTE — Telephone Encounter (Signed)
Called pill pack, they will fax over a list of Patient's current medication in his pill pack as he was unable to tell this morning what medications he is taking.

## 2020-01-19 NOTE — Telephone Encounter (Signed)
With multiple health issues I would rec he go to [facility] urgent care or ED

## 2020-01-23 NOTE — Addendum Note (Signed)
Addended by: Tilford Pillar on: 01/23/2020 03:18 PM   Modules accepted: Orders

## 2020-01-28 DIAGNOSIS — I10 Essential (primary) hypertension: Secondary | ICD-10-CM | POA: Diagnosis not present

## 2020-01-30 ENCOUNTER — Telehealth: Payer: Self-pay | Admitting: Internal Medicine

## 2020-01-30 ENCOUNTER — Other Ambulatory Visit: Payer: Self-pay | Admitting: Internal Medicine

## 2020-01-30 DIAGNOSIS — I5022 Chronic systolic (congestive) heart failure: Secondary | ICD-10-CM

## 2020-01-30 DIAGNOSIS — E876 Hypokalemia: Secondary | ICD-10-CM

## 2020-01-30 DIAGNOSIS — I255 Ischemic cardiomyopathy: Secondary | ICD-10-CM

## 2020-01-30 DIAGNOSIS — I25118 Atherosclerotic heart disease of native coronary artery with other forms of angina pectoris: Secondary | ICD-10-CM

## 2020-01-30 NOTE — Telephone Encounter (Addendum)
Patient called in stated that his heart at [facility] wanted him to come here and get labs done he stated that they were going to call Dr.McLean about this.patient did not know doctor name

## 2020-01-30 NOTE — Telephone Encounter (Signed)
Patient scheduled for labs 10/25 at 9:15 am for labs

## 2020-01-30 NOTE — Telephone Encounter (Signed)
sch nonfasting lab visit for Monday  Thanks Will need to fax labs back to [facility] cardiology

## 2020-01-30 NOTE — Telephone Encounter (Signed)
McLean-Scocuzza, Pasty Spillers, MD routed conversation to You 22 minutes ago (12:53 PM)  McLean-Scocuzza, Pasty Spillers, MD 22 minutes ago (12:53 PM)  TM   sch nonfasting lab visit for Monday  Thanks Will need to fax labs back to [facility] cardiology

## 2020-01-30 NOTE — Telephone Encounter (Signed)
Added to previous telephone encounter from today, 10/22

## 2020-02-02 ENCOUNTER — Other Ambulatory Visit (INDEPENDENT_AMBULATORY_CARE_PROVIDER_SITE_OTHER): Payer: Medicare Other

## 2020-02-02 ENCOUNTER — Other Ambulatory Visit: Payer: Self-pay

## 2020-02-02 DIAGNOSIS — I5022 Chronic systolic (congestive) heart failure: Secondary | ICD-10-CM | POA: Diagnosis not present

## 2020-02-02 DIAGNOSIS — I255 Ischemic cardiomyopathy: Secondary | ICD-10-CM | POA: Diagnosis not present

## 2020-02-02 DIAGNOSIS — I25118 Atherosclerotic heart disease of native coronary artery with other forms of angina pectoris: Secondary | ICD-10-CM | POA: Diagnosis not present

## 2020-02-02 DIAGNOSIS — E876 Hypokalemia: Secondary | ICD-10-CM | POA: Diagnosis not present

## 2020-02-02 LAB — BASIC METABOLIC PANEL
BUN: 20 mg/dL (ref 6–23)
CO2: 29 mEq/L (ref 19–32)
Calcium: 9.8 mg/dL (ref 8.4–10.5)
Chloride: 103 mEq/L (ref 96–112)
Creatinine, Ser: 1.15 mg/dL (ref 0.40–1.50)
GFR: 66.99 mL/min (ref >60.00)
Glucose, Bld: 79 mg/dL (ref 70–99)
Potassium: 4.4 mEq/L (ref 3.5–5.1)
Sodium: 142 mEq/L (ref 135–145)

## 2020-02-02 LAB — MAGNESIUM: Magnesium: 1.8 mg/dL (ref 1.5–2.5)

## 2020-02-03 DIAGNOSIS — R42 Dizziness and giddiness: Secondary | ICD-10-CM | POA: Diagnosis not present

## 2020-02-03 DIAGNOSIS — I2510 Atherosclerotic heart disease of native coronary artery without angina pectoris: Secondary | ICD-10-CM | POA: Diagnosis not present

## 2020-02-03 DIAGNOSIS — I509 Heart failure, unspecified: Secondary | ICD-10-CM | POA: Diagnosis not present

## 2020-02-03 DIAGNOSIS — R071 Chest pain on breathing: Secondary | ICD-10-CM | POA: Diagnosis not present

## 2020-02-03 DIAGNOSIS — I444 Left anterior fascicular block: Secondary | ICD-10-CM | POA: Diagnosis not present

## 2020-02-03 DIAGNOSIS — M50221 Other cervical disc displacement at C4-C5 level: Secondary | ICD-10-CM | POA: Diagnosis not present

## 2020-02-03 DIAGNOSIS — I714 Abdominal aortic aneurysm, without rupture: Secondary | ICD-10-CM | POA: Diagnosis not present

## 2020-02-03 DIAGNOSIS — M4802 Spinal stenosis, cervical region: Secondary | ICD-10-CM | POA: Diagnosis not present

## 2020-02-03 DIAGNOSIS — F17210 Nicotine dependence, cigarettes, uncomplicated: Secondary | ICD-10-CM | POA: Diagnosis not present

## 2020-02-03 DIAGNOSIS — I724 Aneurysm of artery of lower extremity: Secondary | ICD-10-CM | POA: Diagnosis not present

## 2020-02-03 DIAGNOSIS — I110 Hypertensive heart disease with heart failure: Secondary | ICD-10-CM | POA: Diagnosis not present

## 2020-02-03 DIAGNOSIS — M47812 Spondylosis without myelopathy or radiculopathy, cervical region: Secondary | ICD-10-CM | POA: Diagnosis not present

## 2020-02-03 DIAGNOSIS — J449 Chronic obstructive pulmonary disease, unspecified: Secondary | ICD-10-CM | POA: Diagnosis not present

## 2020-02-03 DIAGNOSIS — I739 Peripheral vascular disease, unspecified: Secondary | ICD-10-CM | POA: Diagnosis not present

## 2020-02-03 DIAGNOSIS — I5089 Other heart failure: Secondary | ICD-10-CM | POA: Diagnosis not present

## 2020-02-03 DIAGNOSIS — Z9889 Other specified postprocedural states: Secondary | ICD-10-CM | POA: Diagnosis not present

## 2020-02-03 DIAGNOSIS — R9431 Abnormal electrocardiogram [ECG] [EKG]: Secondary | ICD-10-CM | POA: Diagnosis not present

## 2020-02-03 DIAGNOSIS — R0789 Other chest pain: Secondary | ICD-10-CM | POA: Diagnosis not present

## 2020-02-03 DIAGNOSIS — I255 Ischemic cardiomyopathy: Secondary | ICD-10-CM | POA: Diagnosis not present

## 2020-02-05 ENCOUNTER — Telehealth: Payer: Self-pay

## 2020-02-05 NOTE — Telephone Encounter (Signed)
Martin Hernandez with [facility] called and asked if you would resend lab results back over via fax with pt's d.o.b

## 2020-02-06 NOTE — Telephone Encounter (Signed)
Records resent via epic routing. Date of birth was on page 3 of the original fax as the first 2 pages were the cover sheet and confidentiality sheet

## 2020-02-09 DIAGNOSIS — I739 Peripheral vascular disease, unspecified: Secondary | ICD-10-CM | POA: Diagnosis not present

## 2020-02-09 DIAGNOSIS — I255 Ischemic cardiomyopathy: Secondary | ICD-10-CM | POA: Diagnosis not present

## 2020-02-09 DIAGNOSIS — I5022 Chronic systolic (congestive) heart failure: Secondary | ICD-10-CM | POA: Diagnosis not present

## 2020-02-09 DIAGNOSIS — I714 Abdominal aortic aneurysm, without rupture: Secondary | ICD-10-CM | POA: Diagnosis not present

## 2020-02-09 DIAGNOSIS — I2510 Atherosclerotic heart disease of native coronary artery without angina pectoris: Secondary | ICD-10-CM | POA: Diagnosis not present

## 2020-02-10 DIAGNOSIS — R200 Anesthesia of skin: Secondary | ICD-10-CM | POA: Diagnosis not present

## 2020-02-10 DIAGNOSIS — M5412 Radiculopathy, cervical region: Secondary | ICD-10-CM | POA: Diagnosis not present

## 2020-02-16 DIAGNOSIS — G894 Chronic pain syndrome: Secondary | ICD-10-CM | POA: Diagnosis not present

## 2020-02-16 DIAGNOSIS — M47817 Spondylosis without myelopathy or radiculopathy, lumbosacral region: Secondary | ICD-10-CM | POA: Diagnosis not present

## 2020-02-16 DIAGNOSIS — M5136 Other intervertebral disc degeneration, lumbar region: Secondary | ICD-10-CM | POA: Diagnosis not present

## 2020-02-16 DIAGNOSIS — M5030 Other cervical disc degeneration, unspecified cervical region: Secondary | ICD-10-CM | POA: Diagnosis not present

## 2020-02-17 ENCOUNTER — Telehealth: Payer: Self-pay | Admitting: Internal Medicine

## 2020-02-17 NOTE — Telephone Encounter (Signed)
Steward Drone from [facility] pharmacy called checking prior authoriation for patient's nicotine patch was faxed on 11-08

## 2020-02-18 ENCOUNTER — Other Ambulatory Visit: Payer: Self-pay

## 2020-02-18 DIAGNOSIS — N200 Calculus of kidney: Secondary | ICD-10-CM

## 2020-02-18 DIAGNOSIS — R39198 Other difficulties with micturition: Secondary | ICD-10-CM

## 2020-02-18 MED ORDER — TAMSULOSIN HCL 0.4 MG PO CAPS: 0.8000 mg | ORAL_CAPSULE | Freq: Every day | ORAL | 3 refills | Status: DC

## 2020-02-19 DIAGNOSIS — I2510 Atherosclerotic heart disease of native coronary artery without angina pectoris: Secondary | ICD-10-CM | POA: Diagnosis not present

## 2020-02-19 DIAGNOSIS — E782 Mixed hyperlipidemia: Secondary | ICD-10-CM | POA: Diagnosis not present

## 2020-02-19 DIAGNOSIS — I10 Essential (primary) hypertension: Secondary | ICD-10-CM | POA: Diagnosis not present

## 2020-02-19 DIAGNOSIS — I255 Ischemic cardiomyopathy: Secondary | ICD-10-CM | POA: Diagnosis not present

## 2020-02-19 DIAGNOSIS — I5022 Chronic systolic (congestive) heart failure: Secondary | ICD-10-CM | POA: Diagnosis not present

## 2020-02-23 ENCOUNTER — Telehealth: Payer: Self-pay | Admitting: Internal Medicine

## 2020-02-23 NOTE — Telephone Encounter (Signed)
Prior authorization has been submitted for patient's Nicotine 14 mg/24 hr patch.   Awaiting approval or denial.

## 2020-02-24 ENCOUNTER — Telehealth: Payer: Self-pay | Admitting: Internal Medicine

## 2020-02-24 MED ORDER — AMLODIPINE BESYLATE 2.5 MG PO TABS: 2.5000 mg | ORAL_TABLET | Freq: Every day | ORAL | 11 refills | Status: DC

## 2020-02-24 NOTE — Telephone Encounter (Signed)
Patient is requesting a refill on amLODipine (NORVASC) 2.5 MG tablet, he is out.

## 2020-02-25 DIAGNOSIS — Z95810 Presence of automatic (implantable) cardiac defibrillator: Secondary | ICD-10-CM | POA: Diagnosis not present

## 2020-02-27 DIAGNOSIS — I723 Aneurysm of iliac artery: Secondary | ICD-10-CM | POA: Diagnosis not present

## 2020-02-27 DIAGNOSIS — I739 Peripheral vascular disease, unspecified: Secondary | ICD-10-CM | POA: Diagnosis not present

## 2020-03-02 NOTE — Telephone Encounter (Signed)
Prior authorization has been denied.  Mychart sent to inform the Patient

## 2020-03-17 DIAGNOSIS — M79604 Pain in right leg: Secondary | ICD-10-CM | POA: Diagnosis not present

## 2020-03-17 DIAGNOSIS — M5136 Other intervertebral disc degeneration, lumbar region: Secondary | ICD-10-CM | POA: Diagnosis not present

## 2020-03-17 DIAGNOSIS — M5030 Other cervical disc degeneration, unspecified cervical region: Secondary | ICD-10-CM | POA: Diagnosis not present

## 2020-03-17 DIAGNOSIS — G894 Chronic pain syndrome: Secondary | ICD-10-CM | POA: Diagnosis not present

## 2020-03-17 DIAGNOSIS — Z79891 Long term (current) use of opiate analgesic: Secondary | ICD-10-CM | POA: Diagnosis not present

## 2020-03-17 DIAGNOSIS — Z79899 Other long term (current) drug therapy: Secondary | ICD-10-CM | POA: Diagnosis not present

## 2020-03-25 ENCOUNTER — Ambulatory Visit: Payer: Self-pay | Admitting: Urology

## 2020-03-25 ENCOUNTER — Encounter: Payer: Self-pay | Admitting: Urology

## 2020-04-01 DIAGNOSIS — Z95810 Presence of automatic (implantable) cardiac defibrillator: Secondary | ICD-10-CM | POA: Diagnosis not present

## 2020-04-10 DIAGNOSIS — Z95810 Presence of automatic (implantable) cardiac defibrillator: Secondary | ICD-10-CM | POA: Diagnosis not present

## 2020-04-14 DIAGNOSIS — M47817 Spondylosis without myelopathy or radiculopathy, lumbosacral region: Secondary | ICD-10-CM | POA: Diagnosis not present

## 2020-04-14 DIAGNOSIS — M5136 Other intervertebral disc degeneration, lumbar region: Secondary | ICD-10-CM | POA: Diagnosis not present

## 2020-04-14 DIAGNOSIS — G894 Chronic pain syndrome: Secondary | ICD-10-CM | POA: Diagnosis not present

## 2020-04-14 DIAGNOSIS — M79604 Pain in right leg: Secondary | ICD-10-CM | POA: Diagnosis not present

## 2020-04-19 DIAGNOSIS — Z03818 Encounter for observation for suspected exposure to other biological agents ruled out: Secondary | ICD-10-CM | POA: Diagnosis not present

## 2020-04-19 DIAGNOSIS — Z20822 Contact with and (suspected) exposure to covid-19: Secondary | ICD-10-CM | POA: Diagnosis not present

## 2020-04-20 ENCOUNTER — Encounter: Payer: Self-pay | Admitting: Internal Medicine

## 2020-04-20 ENCOUNTER — Ambulatory Visit (INDEPENDENT_AMBULATORY_CARE_PROVIDER_SITE_OTHER): Payer: Medicare Other | Admitting: Internal Medicine

## 2020-04-20 ENCOUNTER — Other Ambulatory Visit: Payer: Self-pay

## 2020-04-20 VITALS — BP 120/76 | HR 73 | Temp 97.6°F | Ht 69.0 in | Wt 133.6 lb

## 2020-04-20 DIAGNOSIS — Z0000 Encounter for general adult medical examination without abnormal findings: Secondary | ICD-10-CM | POA: Insufficient documentation

## 2020-04-20 DIAGNOSIS — M5030 Other cervical disc degeneration, unspecified cervical region: Secondary | ICD-10-CM | POA: Insufficient documentation

## 2020-04-20 DIAGNOSIS — E559 Vitamin D deficiency, unspecified: Secondary | ICD-10-CM | POA: Diagnosis not present

## 2020-04-20 DIAGNOSIS — Z1329 Encounter for screening for other suspected endocrine disorder: Secondary | ICD-10-CM

## 2020-04-20 DIAGNOSIS — I10 Essential (primary) hypertension: Secondary | ICD-10-CM

## 2020-04-20 DIAGNOSIS — I2510 Atherosclerotic heart disease of native coronary artery without angina pectoris: Secondary | ICD-10-CM

## 2020-04-20 DIAGNOSIS — G4700 Insomnia, unspecified: Secondary | ICD-10-CM | POA: Diagnosis not present

## 2020-04-20 DIAGNOSIS — I723 Aneurysm of iliac artery: Secondary | ICD-10-CM

## 2020-04-20 DIAGNOSIS — Z125 Encounter for screening for malignant neoplasm of prostate: Secondary | ICD-10-CM

## 2020-04-20 HISTORY — DX: Aneurysm of iliac artery: I72.3

## 2020-04-20 HISTORY — DX: Encounter for general adult medical examination without abnormal findings: Z00.00

## 2020-04-20 MED ORDER — ATORVASTATIN CALCIUM 80 MG PO TABS: 80.0000 mg | ORAL_TABLET | Freq: Every day | ORAL | 3 refills | Status: DC

## 2020-04-20 MED ORDER — ZOLPIDEM TARTRATE 10 MG PO TABS: 10.0000 mg | ORAL_TABLET | Freq: Every evening | ORAL | 3 refills | Status: DC | PRN

## 2020-04-20 NOTE — Patient Instructions (Addendum)
02/27/2020 Initial consult [facility] at [facility]  [address]  [address]  [place], [place] [address]  [phone]  Valerie Salts, MD  [address]  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 PNEUMOCOCCAL VACCINE (NEU mo KOK al vak SEEN) is a vaccine used to prevent pneumococcus bacterial infections. These bacteria can cause serious infections like pneumonia, meningitis, and blood infections. This vaccine will lower your chance of getting pneumonia. If you do get pneumonia, it can make your symptoms milder and your illness shorter. This vaccine will not treat an infection and will not cause infection. This vaccine is recommended for infants and young children, adults with certain medical conditions, and adults 65 years or older. This medicine may be used for other purposes; ask your health care provider or pharmacist if you have questions. COMMON BRAND NAME(S): Prevnar, Prevnar 13 What should I tell my health care provider before I take this medicine? They need to know if you have any of these conditions:  bleeding problems  fever  immune system problems  an unusual or allergic reaction to pneumococcal vaccine, diphtheria toxoid, other vaccines, latex, other medicines, foods, dyes, or preservatives  pregnant or trying to get pregnant  breast-feeding How should I use this medicine? This vaccine is for injection into a muscle. It is given by a health care professional. A copy of Vaccine Information Statements will be given before each vaccination. Read this sheet carefully each time. The sheet may  change frequently. Talk to your pediatrician regarding the use of this medicine in children. While this drug may be prescribed for children as young as 82 weeks old for selected conditions, precautions do apply. Overdosage: If you think you have taken too much of this medicine contact a poison control center or emergency room at once. NOTE: This medicine is only for you. Do not share this medicine with others. What if I miss a dose? It is important not to miss your dose. Call your doctor or health care professional if you are unable to keep an appointment. What may interact with this medicine?  medicines for cancer chemotherapy  medicines that suppress your immune function  steroid medicines like prednisone or cortisone This list may not describe all possible interactions. Give your health care provider a list of all the medicines, herbs, non-prescription drugs, or dietary supplements you use. Also tell them if you smoke, drink alcohol, or use illegal drugs. Some items may interact with your medicine. What should I watch for while using this medicine? Mild fever and pain should go away in 3 days or less. Report any unusual symptoms to your doctor or health care professional. What side effects may I notice from receiving this medicine? Side effects that you should report to your doctor or health care professional as soon as possible:  allergic reactions like skin rash, itching or hives, swelling of the face, lips, or tongue  breathing problems  confused  fast or irregular heartbeat  fever over 102 degrees F  seizures  unusual bleeding or bruising  unusual muscle weakness Side effects that usually do not require medical attention (report to your doctor or health care  professional if they continue or are bothersome):  aches and pains  diarrhea  fever of 102 degrees F or less  headache  irritable  loss of appetite  pain, tender at site where injected  trouble  sleeping This list may not describe all possible side effects. Call your doctor for medical advice about side effects. You may report side effects to FDA at 1-800-FDA-1088. Where should I keep my medicine? This does not apply. This vaccine is given in a clinic, pharmacy, doctor's office, or other health care setting and will not be stored at home. NOTE: This sheet is a summary. It may not cover all possible information. If you have questions about this medicine, talk to your doctor, pharmacist, or health care provider.  2021 Elsevier/Gold Standard (2014-01-01 10:27:27)

## 2020-04-20 NOTE — Progress Notes (Signed)
Chief Complaint  Patient presents with  . Follow-up   Annual  1 HTN controlled on norvasc 2.5 mg qd, lis 5 mg qd, toprol xl 25 mg qd lasix 20 mg qd  2. Insomnia needs refill of ambien 10 mg qd out since 02/2020 needed refill  3. Iliac anerurysm left and PAD procedure sch tbd with vascular [facility]    Review of Systems  Constitutional: Negative for weight loss.  HENT: Negative for hearing loss.   Eyes: Negative for blurred vision.  Respiratory: Negative for shortness of breath.   Cardiovascular: Negative for chest pain.  Gastrointestinal: Negative for abdominal pain.  Musculoskeletal:       +leg pain    Skin: Negative for rash.  Neurological: Negative for headaches.  Psychiatric/Behavioral: Negative for depression.   Past Medical History:  Diagnosis Date  . Allergy   . CAD (coronary artery disease)   . Depression   . Frequent headaches   . GERD (gastroesophageal reflux disease)   . Headache   . Hyperlipidemia   . Hypertension   . PAD (peripheral artery disease) (HCC)   . Tobacco abuse    Past Surgical History:  Procedure Laterality Date  . COLONOSCOPY WITH PROPOFOL N/A 06/20/2017   Procedure: COLONOSCOPY WITH PROPOFOL;  Surgeon: Wyline Mood, MD;  Location: [facility] ENDOSCOPY;  Service: Gastroenterology;  Laterality: N/A;  . COLONOSCOPY WITH PROPOFOL N/A 07/24/2017   Procedure: COLONOSCOPY WITH PROPOFOL;  Surgeon: Wyline Mood, MD;  Location[facility] ENDOSCOPY;  Service: Gastroenterology;  Laterality: N/A;  . ESOPHAGOGASTRODUODENOSCOPY (EGD) WITH PROPOFOL N/A 06/20/2017   Procedure: ESOPHAGOGASTRODUODENOSCOPY (EGD) WITH PROPOFOL;  Surgeon: Wyline Mood, MD;  Location: [facility] ENDOSCOPY;  Service: Gastroenterology;  Laterality: N/A;  . PERCUTANEOUS CORONARY STENT INTERVENTION (PCI-S)    . STOMACH SURGERY     2009 [facility] per chart review pt had AAA repair   . TONSILLECTOMY     Family History  Problem Relation Age of Onset  . Diabetes Mother   . Heart disease Mother   . Hypertension  Mother   . Stroke Mother   . Prostate cancer Father   . Cancer Father        prostate cancer in 19s  . Diabetes Sister   . Heart disease Sister   . Ulcers Other        unknown who had ulcers in family    Social History   Socioeconomic History  . Marital status: Married    Spouse name: Not on file  . Number of children: Not on file  . Years of education: Not on file  . Highest education level: Not on file  Occupational History  . Not on file  Tobacco Use  . Smoking status: Current Every Day Smoker    Packs/day: 0.25    Types: Cigarettes  . Smokeless tobacco: Never Used  Vaping Use  . Vaping Use: Never used  Substance and Sexual Activity  . Alcohol use: No    Alcohol/week: 0.0 standard drinks    Comment: quit 1 year ago  . Drug use: No  . Sexual activity: Not Currently  Other Topics Concern  . Not on file  Social History Narrative   Disabled    4 kids 2 living    Used to do mill work    Smoker since age 42 max 2 pk per week now 1 ppd as of 02/2017    Married    Social Determinants of Corporate investment banker Strain: Not on file  Food Insecurity: Not  on file  Transportation Needs: Not on file  Physical Activity: Not on file  Stress: Not on file  Social Connections: Not on file  Intimate Partner Violence: Not on file   Current Meds  Medication Sig  . albuterol (VENTOLIN HFA) 108 (90 Base) MCG/ACT inhaler Inhale 1-2 puffs into the lungs every 6 (six) hours as needed for wheezing or shortness of breath. RUSH order please  . amLODipine (NORVASC) 2.5 MG tablet Take 1 tablet (2.5 mg total) by mouth daily.  Marland Kitchen aspirin 81 MG tablet Take 81 mg by mouth daily.  . Baclofen 5 MG TABS Take 1 tablet by mouth 2 (two) times daily.  . Cholecalciferol 1.25 MG (50000 UT) capsule Take 1 capsule (50,000 Units total) by mouth once a week.  . clopidogrel (PLAVIX) 75 MG tablet 1 pill daily  . cyanocobalamin 100 MCG tablet Take 1 tablet by mouth daily.  . finasteride (PROSCAR) 5 MG  tablet Take 1 tablet (5 mg total) by mouth daily.  . fluticasone (FLONASE) 50 MCG/ACT nasal spray Place 1-2 sprays into both nostrils daily.  . furosemide (LASIX) 20 MG tablet Take 20 mg by mouth daily.  Marland Kitchen gabapentin (NEURONTIN) 300 MG capsule 1 capsule 3 (three) times daily  . ipratropium (ATROVENT) 0.02 % nebulizer solution Inhale into the lungs.  . isosorbide mononitrate (IMDUR) 60 MG 24 hr tablet Take 1 tablet by mouth in the morning, at noon, and at bedtime.  Marland Kitchen levocetirizine (XYZAL) 5 MG tablet Take 1 tablet (5 mg total) by mouth every evening.  Marland Kitchen lisinopril (ZESTRIL) 5 MG tablet 1 pill daily  . metoprolol succinate (TOPROL-XL) 25 MG 24 hr tablet Take 1 tablet (25 mg total) by mouth daily.  . mirtazapine (REMERON) 30 MG tablet 1 pill nightly  . nitroGLYCERIN (NITROSTAT) 0.4 MG SL tablet Place 1 tablet (0.4 mg total) under the tongue every 5 (five) minutes as needed for chest pain.  Marland Kitchen oxyCODONE-acetaminophen (PERCOCET) 10-325 MG tablet   . pantoprazole (PROTONIX) 40 MG tablet Take 1 tablet (40 mg total) by mouth daily. 30 minutes before food  . potassium chloride (KLOR-CON) 10 MEQ tablet Take 10 mEq by mouth daily.  Marland Kitchen pyridOXINE (VITAMIN B-6) 100 MG tablet Take 1 tablet (100 mg total) by mouth daily.  . sodium chloride (OCEAN) 0.65 % SOLN nasal spray Place 2 sprays into both nostrils as needed for congestion.  . tamsulosin (FLOMAX) 0.4 MG CAPS capsule Take 2 capsules (0.8 mg total) by mouth daily after supper.  . [DISCONTINUED] atorvastatin (LIPITOR) 80 MG tablet Take 1 tablet (80 mg total) by mouth daily at 6 PM.  . [DISCONTINUED] zolpidem (AMBIEN) 10 MG tablet Take 1 tablet (10 mg total) by mouth at bedtime as needed for sleep.   No Known Allergies Recent Results (from the past 2160 hour(s))  Magnesium     Status: None   Collection Time: 02/02/20  9:08 AM  Result Value Ref Range   Magnesium 1.8 1.5 - 2.5 mg/dL  Basic Metabolic Panel (BMET)     Status: None   Collection Time:  02/02/20  9:08 AM  Result Value Ref Range   Sodium 142 135 - 145 mEq/L   Potassium 4.4 3.5 - 5.1 mEq/L   Chloride 103 96 - 112 mEq/L   CO2 29 19 - 32 mEq/L   Glucose, Bld 79 70 - 99 mg/dL   BUN 20 6 - 23 mg/dL   Creatinine, Ser 1.61 0.40 - 1.50 mg/dL   GFR 09.60 >45.40 mL/min  Calcium 9.8 8.4 - 10.5 mg/dL   Objective  Body mass index is 19.73 kg/m. Wt Readings from Last 3 Encounters:  04/20/20 133 lb 9.6 oz (60.6 kg)  10/17/19 129 lb 12.8 oz (58.9 kg)  09/18/19 129 lb (58.5 kg)   Temp Readings from Last 3 Encounters:  04/20/20 97.6 F (36.4 C) (Oral)  10/17/19 98.3 F (36.8 C) (Oral)  06/27/19 (!) 97.2 F (36.2 C) (Temporal)   BP Readings from Last 3 Encounters:  04/20/20 120/76  10/17/19 106/70  09/18/19 104/68   Pulse Readings from Last 3 Encounters:  04/20/20 73  10/17/19 86  09/18/19 77    Physical Exam Vitals and nursing note reviewed.  Constitutional:      Appearance: Normal appearance. He is well-developed and well-groomed.  HENT:     Head: Normocephalic and atraumatic.  Eyes:     Conjunctiva/sclera: Conjunctivae normal.     Pupils: Pupils are equal, round, and reactive to light.  Cardiovascular:     Rate and Rhythm: Normal rate and regular rhythm.     Heart sounds: Normal heart sounds. No murmur heard.   Pulmonary:     Effort: Pulmonary effort is normal.     Breath sounds: Normal breath sounds.  Skin:    General: Skin is warm and dry.  Neurological:     General: No focal deficit present.     Mental Status: He is alert. Mental status is at baseline.     Gait: Gait normal.  Psychiatric:        Attention and Perception: Attention and perception normal.        Mood and Affect: Mood and affect normal.        Speech: Speech normal.        Behavior: Behavior normal. Behavior is cooperative.        Thought Content: Thought content normal.        Cognition and Memory: Cognition normal.        Judgment: Judgment normal.     Assessment  Plan    Annual Declines flu shot Had3/3 doses hep B check titerin futureconsider Will disc Tdap and shingrix in future moderna 3/3  prevnar disc today declines consider this in future with pna 23   PSA had11/20/20 2.19normal DRE in futurecheck PSA upcoming11/2021 Hep C negative -h/o hematuria CT 08/2017 with left kidney6 mmstone could be etiologyand CT 04/2019 with b/l nonobstructing kidney stones on CT L spine HIV neg 03/29/15  Colonoscopy 3 and 07/2017 hadpoor prep diverticulosis  -f/u GI 03/05/18 will ask if will order CT liver protocol for liver lesion -of note will [facility] never had CT ab/pelvis sch 11 or 03/2018 -per pts wife cardiology does not want further procedures for now and especially without their approvalpreviously and as of 02/26/19 no $ to f/u GI  Eye glasses best in [place] cataracts b/l eyes glasses helpseen in 2020/2021  [facility]  Urology Dr. 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 Jacqulyn Bath f/u tbd   DDD (degenerative disc disease), cervical F/u pain clinic   Insomnia, unspecified type - Plan: zolpidem (AMBIEN) 10 MG tablet  Coronary artery disease involving native coronary artery of native heart without angina pectoris - Plan: atorvastatin (LIPITOR) 80 MG tablet  F/u [facility]  HTN controlled cont meds f/u [facility] norvasc 2.5 mg qd, lis 5 mg qd, toprol xl 25 mg qd lasix 20 mg qd  Provider: Dr. Olivia Mackie McLean-Scocuzza-Internal Medicine

## 2020-04-22 ENCOUNTER — Ambulatory Visit (INDEPENDENT_AMBULATORY_CARE_PROVIDER_SITE_OTHER): Payer: Medicare Other

## 2020-04-22 VITALS — Ht 69.0 in | Wt 133.0 lb

## 2020-04-22 DIAGNOSIS — Z0000 Encounter for general adult medical examination without abnormal findings: Secondary | ICD-10-CM | POA: Diagnosis not present

## 2020-04-22 NOTE — Progress Notes (Signed)
Subjective:   Martin Hernandez. is a 65 y.o. male who presents for Medicare Annual/Subsequent preventive examination.  Review of Systems    No ROS.  Medicare Wellness Virtual Visit.   Cardiac Risk Factors include: advanced age (>35men, >42 women);male gender;hypertension     Objective:    Today's Vitals   04/22/20 0931  Weight: 133 lb (60.3 kg)  Height: 5\' 9"  (1.753 m)   Body mass index is 19.64 kg/m.  Advanced Directives 04/22/2020 04/22/2019 04/18/2018 04/18/2018 11/05/2017 07/24/2017 11/02/2016  Does Patient Have a Medical Advance Directive? Yes No No No No Yes No  Type of Advance Directive Healthcare Power of Attorney - - - - 11/04/2016;Living will -  Does patient want to make changes to medical advance directive? No - Patient declined - - - - - -  Copy of Healthcare Power of Attorney in Chart? No - copy requested - - - - No - copy requested -  Would patient like information on creating a medical advance directive? - Yes (MAU/Ambulatory/Procedural Areas - Information given) No - Patient declined No - Patient declined Yes (MAU/Ambulatory/Procedural Areas - Information given) - Yes (MAU/Ambulatory/Procedural Areas - Information given)    Current Medications (verified) Outpatient Encounter Medications as of 04/22/2020  Medication Sig  . albuterol (VENTOLIN HFA) 108 (90 Base) MCG/ACT inhaler Inhale 1-2 puffs into the lungs every 6 (six) hours as needed for wheezing or shortness of breath. RUSH order please  . amLODipine (NORVASC) 2.5 MG tablet Take 1 tablet (2.5 mg total) by mouth daily.  04/24/2020 aspirin 81 MG tablet Take 81 mg by mouth daily.  Marland Kitchen atorvastatin (LIPITOR) 80 MG tablet Take 1 tablet (80 mg total) by mouth daily at 6 PM.  . Baclofen 5 MG TABS Take 1 tablet by mouth 2 (two) times daily.  . Cholecalciferol 1.25 MG (50000 UT) capsule Take 1 capsule (50,000 Units total) by mouth once a week.  . clopidogrel (PLAVIX) 75 MG tablet 1 pill daily  .  cyanocobalamin 100 MCG tablet Take 1 tablet by mouth daily.  . finasteride (PROSCAR) 5 MG tablet Take 1 tablet (5 mg total) by mouth daily.  . fluticasone (FLONASE) 50 MCG/ACT nasal spray Place 1-2 sprays into both nostrils daily.  . furosemide (LASIX) 20 MG tablet Take 20 mg by mouth daily.  Marland Kitchen gabapentin (NEURONTIN) 300 MG capsule 1 capsule 3 (three) times daily  . ipratropium (ATROVENT) 0.02 % nebulizer solution Inhale into the lungs.  . isosorbide mononitrate (IMDUR) 60 MG 24 hr tablet Take 1 tablet by mouth in the morning, at noon, and at bedtime.  Marland Kitchen levocetirizine (XYZAL) 5 MG tablet Take 1 tablet (5 mg total) by mouth every evening.  Marland Kitchen lisinopril (ZESTRIL) 5 MG tablet 1 pill daily  . metoprolol succinate (TOPROL-XL) 25 MG 24 hr tablet Take 1 tablet (25 mg total) by mouth daily.  . mirtazapine (REMERON) 30 MG tablet 1 pill nightly  . nitroGLYCERIN (NITROSTAT) 0.4 MG SL tablet Place 1 tablet (0.4 mg total) under the tongue every 5 (five) minutes as needed for chest pain.  Marland Kitchen oxyCODONE-acetaminophen (PERCOCET) 10-325 MG tablet   . pantoprazole (PROTONIX) 40 MG tablet Take 1 tablet (40 mg total) by mouth daily. 30 minutes before food  . potassium chloride (KLOR-CON) 10 MEQ tablet Take 10 mEq by mouth daily.  Marland Kitchen pyridOXINE (VITAMIN B-6) 100 MG tablet Take 1 tablet (100 mg total) by mouth daily.  . sodium chloride (OCEAN) 0.65 % SOLN nasal spray Place  2 sprays into both nostrils as needed for congestion.  . tamsulosin (FLOMAX) 0.4 MG CAPS capsule Take 2 capsules (0.8 mg total) by mouth daily after supper.  . zolpidem (AMBIEN) 10 MG tablet Take 1 tablet (10 mg total) by mouth at bedtime as needed for sleep.   No facility-administered encounter medications on file as of 04/22/2020.    Allergies (verified) Patient has no known allergies.   History: Past Medical History:  Diagnosis Date  . Allergy   . CAD (coronary artery disease)   . Depression   . Frequent headaches   . GERD  (gastroesophageal reflux disease)   . Headache   . Hyperlipidemia   . Hypertension   . PAD (peripheral artery disease) (HCC)   . Tobacco abuse    Past Surgical History:  Procedure Laterality Date  . COLONOSCOPY WITH PROPOFOL N/A 06/20/2017   Procedure: COLONOSCOPY WITH PROPOFOL;  Surgeon: Wyline Mood, MD;  Location: [facility] ENDOSCOPY;  Service: Gastroenterology;  Laterality: N/A;  . COLONOSCOPY WITH PROPOFOL N/A 07/24/2017   Procedure: COLONOSCOPY WITH PROPOFOL;  Surgeon: Wyline Mood, MD;  Location: [facility] ENDOSCOPY;  Service: Gastroenterology;  Laterality: N/A;  . ESOPHAGOGASTRODUODENOSCOPY (EGD) WITH PROPOFOL N/A 06/20/2017   Procedure: ESOPHAGOGASTRODUODENOSCOPY (EGD) WITH PROPOFOL;  Surgeon: Wyline Mood, MD;  Location: [facility] ENDOSCOPY;  Service: Gastroenterology;  Laterality: N/A;  . PERCUTANEOUS CORONARY STENT INTERVENTION (PCI-S)    . STOMACH SURGERY     [facility] per chart review pt had AAA repair   . TONSILLECTOMY     Family History  Problem Relation Age of Onset  . Diabetes Mother   . Heart disease Mother   . Hypertension Mother   . Stroke Mother   . Prostate cancer Father   . Cancer Father        prostate cancer in 38s  . Diabetes Sister   . Heart disease Sister   . Ulcers Other        unknown who had ulcers in family    Social History   Socioeconomic History  . Marital status: Married    Spouse name: Not on file  . Number of children: Not on file  . Years of education: Not on file  . Highest education level: Not on file  Occupational History  . Not on file  Tobacco Use  . Smoking status: Current Every Day Smoker    Packs/day: 0.25    Types: Cigarettes  . Smokeless tobacco: Never Used  Vaping Use  . Vaping Use: Never used  Substance and Sexual Activity  . Alcohol use: No    Alcohol/week: 0.0 standard drinks    Comment: quit 1 year ago  . Drug use: No  . Sexual activity: Not Currently  Other Topics Concern  . Not on file  Social History Narrative   Disabled     4 kids 2 living    Used to do mill work    Smoker since age 78 max 2 pk per week now 1 ppd as of 02/2017    Married    Social Determinants of Corporate investment banker Strain: Not on file  Food Insecurity: Not on file  Transportation Needs: Not on file  Physical Activity: Not on file  Stress: Not on file  Social Connections: Not on file    Tobacco Counseling Ready to quit: Not Answered Counseling given: Not Answered   Clinical Intake:  Pre-visit preparation completed: Yes        Diabetes: No  How often do you  need to have someone help you when you read instructions, pamphlets, or other written materials from your doctor or pharmacy?: 1 - Never   Interpreter Needed?: No      Activities of Daily Living In your present state of health, do you have any difficulty performing the following activities: 04/22/2020  Hearing? N  Vision? N  Difficulty concentrating or making decisions? N  Walking or climbing stairs? Y  Comment Unsteady gait. Paces self.  Dressing or bathing? N  Doing errands, shopping? N  Preparing Food and eating ? N  Using the Toilet? N  In the past six months, have you accidently leaked urine? N  Do you have problems with loss of bowel control? N  Managing your Medications? N  Managing your Finances? N  Housekeeping or managing your Housekeeping? N  Some recent data might be hidden    Patient Care Team: McLean-Scocuzza, Pasty Spillersracy N, MD as PCP - General (Internal Medicine)  Indicate any recent Medical Services you may have received from other than [facility] providers in the past year (date may be approximate).     Assessment:   This is a routine wellness examination for Lillia AbedLindsay.  I connected with Lillia AbedLindsay today by telephone and verified that I am speaking with the correct person using two identifiers. Location patient: home Location provider: work Persons participating in the virtual visit: patient, Engineer, civil (consulting)nurse.    I discussed the limitations,  risks, security and privacy concerns of performing an evaluation and management service by telephone and the availability of in person appointments. The patient expressed understanding and verbally consented to this telephonic visit.    Interactive audio and video telecommunications were attempted between this provider and patient, however failed, due to patient having technical difficulties OR patient did not have access to video capability.  We continued and completed visit with audio only.  Some vital signs may be absent or patient reported.   Hearing/Vision screen  Hearing Screening   125Hz  250Hz  500Hz  1000Hz  2000Hz  3000Hz  4000Hz  6000Hz  8000Hz   Right ear:           Left ear:           Comments: Patient is able to hear conversational tones without difficulty.  No issues reported.   Vision Screening Comments: Visual acuity not assessed per patient preference.   Dietary issues and exercise activities discussed:    Regular diet Good water intake   Goals    . Follow up with Primary Care Provider     As needed      Depression Screen PHQ 2/9 Scores 04/22/2020 10/17/2019 06/27/2019 04/22/2019 02/26/2019 11/05/2017 11/02/2016  PHQ - 2 Score 0 0 0 0 0 0 0  PHQ- 9 Score - 0 - - - - 0    Fall Risk Fall Risk  04/22/2020 04/20/2020 10/17/2019 06/27/2019 04/22/2019  Falls in the past year? 0 0 0 0 0  Number falls in past yr: - 0 0 0 0  Injury with Fall? - 0 0 0 -  Risk Factor Category  - - - - -  Risk for fall due to : - - - - -  Follow up - Falls evaluation completed Falls evaluation completed Falls evaluation completed Falls prevention discussed    FALL RISK PREVENTION PERTAINING TO THE HOME: Handrails in use when climbing stairs?Yes Home free of loose throw rugs in walkways, pet beds, electrical cords, etc? Yes  Adequate lighting in your home to reduce risk of falls? Yes   ASSISTIVE DEVICES UTILIZED  TO PREVENT FALLS: Life alert? Yes  Use of a cane, walker or w/c? No     TIMED UP AND  GO: Was the test performed? No . Virtual visit.   Cognitive Function: MMSE - Mini Mental State Exam 11/03/2015  Orientation to time 5  Orientation to Place 5  Registration 3  Attention/ Calculation 5  Recall 3  Language- name 2 objects 2  Language- repeat 1  Language- follow 3 step command 3  Language- read & follow direction 1  Write a sentence 1  Copy design 1  Total score 30     6CIT Screen 04/22/2020 04/22/2019 11/05/2017 11/02/2016  What Year? 0 points 0 points 0 points 0 points  What month? 0 points 0 points 0 points 0 points  What time? 0 points 0 points 0 points 0 points  Count back from 20 - 0 points 0 points 0 points  Months in reverse 0 points 4 points 2 points 4 points  Repeat phrase 0 points - 0 points 0 points  Total Score - - 2 4    Immunizations Immunization History  Administered Date(s) Administered  . Hepatitis B, adult 03/08/2017, 05/22/2017, 05/30/2018  . Influenza-Unspecified 04/19/2012  . Moderna SARS-COV2 Booster Vaccination 03/22/2020  . Moderna Sars-Covid-2 Vaccination 06/18/2019, 07/19/2019   Health Maintenance Health Maintenance  Topic Date Due  . HIV Screening  Never done  . INFLUENZA VACCINE  07/08/2020 (Originally 11/09/2019)  . COLONOSCOPY (Pts 45-64yrs Insurance coverage will need to be confirmed)  07/25/2027  . COVID-19 Vaccine  Completed  . Hepatitis C Screening  Completed  . TETANUS/TDAP  Discontinued  . PNA vac Low Risk Adult  Discontinued   Colorectal cancer screening: Type of screening: Colonoscopy. Completed 07/14/17. Repeat every 10 years  Hepatitis C Screening: Completed 02/23/17.  Dental Screening: Recommended annual dental exams for proper oral hygiene.  Community Resource Referral / Chronic Care Management: CRR required this visit?  No   CCM required this visit?  No      Plan:   Keep all routine maintenance appointments.   Lab 10/18/20 @ 8:00  I have personally reviewed and noted the following in the patient's chart:    . Medical and social history . Use of alcohol, tobacco or illicit drugs  . Current medications and supplements . Functional ability and status . Nutritional status . Physical activity . Advanced directives . List of other physicians . Hospitalizations, surgeries, and ER visits in previous 12 months . Vitals . Screenings to include cognitive, depression, and falls . Referrals and appointments  In addition, I have reviewed and discussed with patient certain preventive protocols, quality metrics, and best practice recommendations. A written personalized care plan for preventive services as well as general preventive health recommendations were provided to patient via mychart.     Ashok Pall, LPN   12/07/5619

## 2020-04-22 NOTE — Patient Instructions (Addendum)
[name] , Thank you for taking time to come for your Medicare Wellness Visit. I appreciate your ongoing commitment to your health goals. Please review the following plan we discussed and let me know if I can assist you in the future.   These are the goals we discussed: Goals    . Follow up with Primary Care Provider     As needed       This is a list of the screening recommended for you and due dates:  Health Maintenance  Topic Date Due  . HIV Screening  Never done  . Flu Shot  07/08/2020*  . Colon Cancer Screening  07/25/2027  . COVID-19 Vaccine  Completed  .  Hepatitis C: One time screening is recommended by Center for Disease Control  (CDC) for  adults born from [birth year] through 1966.   Completed  . Tetanus Vaccine  Discontinued  . Pneumonia vaccines  Discontinued  *Topic was postponed. The date shown is not the original due date.    Immunizations Immunization History  Administered Date(s) Administered  . Hepatitis B, adult 03/08/2017, 05/22/2017, 05/30/2018  . Influenza-Unspecified 04/19/2012  . Moderna SARS-COV2 Booster Vaccination 03/22/2020  . Moderna Sars-Covid-2 Vaccination 06/18/2019, 07/19/2019   Lab 10/18/20 @ 8:00  Advanced directives: End of life planning; Advance aging; Advanced directives discussed.  Copy of current HCPOA/Living Will requested.    Conditions/risks identified: none new.  Follow up in one year for your annual wellness visit.   Preventive Care 66 Years and Older, Male Preventive care refers to lifestyle choices and visits with your health care provider that can promote health and wellness. What does preventive care include?  A yearly physical exam. This is also called an annual well check.  Dental exams once or twice a year.  Routine eye exams. Ask your health care provider how often you should have your eyes checked.  Personal lifestyle choices, including:  Daily care of your teeth and gums.  Regular physical activity.  Eating  a healthy diet.  Avoiding tobacco and drug use.  Limiting alcohol use.  Practicing safe sex.  Taking low doses of aspirin every day.  Taking vitamin and mineral supplements as recommended by your health care provider. What happens during an annual well check? The services and screenings done by your health care provider during your annual well check will depend on your age, overall health, lifestyle risk factors, and family history of disease. Counseling  Your health care provider may ask you questions about your:  Alcohol use.  Tobacco use.  Drug use.  Emotional well-being.  Home and relationship well-being.  Sexual activity.  Eating habits.  History of falls.  Memory and ability to understand (cognition).  Work and work Astronomer. Screening  You may have the following tests or measurements:  Height, weight, and BMI.  Blood pressure.  Lipid and cholesterol levels. These may be checked every 5 years, or more frequently if you are over 70 years old.  Skin check.  Lung cancer screening. You may have this screening every year starting at age 110 if you have a 30-pack-year history of smoking and currently smoke or have quit within the past 15 years.  Fecal occult blood test (FOBT) of the stool. You may have this test every year starting at age 74.  Flexible sigmoidoscopy or colonoscopy. You may have a sigmoidoscopy every 5 years or a colonoscopy every 10 years starting at age 92.  Prostate cancer screening. Recommendations will vary depending on your family  history and other risks.  Hepatitis C blood test.  Hepatitis B blood test.  Sexually transmitted disease (STD) testing.  Diabetes screening. This is done by checking your blood sugar (glucose) after you have not eaten for a while (fasting). You may have this done every 1-3 years.  Abdominal aortic aneurysm (AAA) screening. You may need this if you are a current or former smoker.  Osteoporosis. You may  be screened starting at age 65 if you are at high risk. Talk with your health care provider about your test results, treatment options, and if necessary, the need for more tests. Vaccines  Your health care provider may recommend certain vaccines, such as:  Influenza vaccine. This is recommended every year.  Tetanus, diphtheria, and acellular pertussis (Tdap, Td) vaccine. You may need a Td booster every 10 years.  Zoster vaccine. You may need this after age 58.  Pneumococcal 13-valent conjugate (PCV13) vaccine. One dose is recommended after age 36.  Pneumococcal polysaccharide (PPSV23) vaccine. One dose is recommended after age 93. Talk to your health care provider about which screenings and vaccines you need and how often you need them. This information is not intended to replace advice given to you by your health care provider. Make sure you discuss any questions you have with your health care provider. Document Released: 04/23/2015 Document Revised: 12/15/2015 Document Reviewed: 01/26/2015 Elsevier Interactive Patient Education  2017 [employer].  Fall Prevention in the Home Falls can cause injuries. They can happen to people of all ages. There are many things you can do to make your home safe and to help prevent falls. What can I do on the outside of my home?  Regularly fix the edges of walkways and driveways and fix any cracks.  Remove anything that might make you trip as you walk through a door, such as a raised step or threshold.  Trim any bushes or trees on the path to your home.  Use bright outdoor lighting.  Clear any walking paths of anything that might make someone trip, such as rocks or tools.  Regularly check to see if handrails are loose or broken. Make sure that both sides of any steps have handrails.  Any raised decks and porches should have guardrails on the edges.  Have any leaves, snow, or ice cleared regularly.  Use sand or salt on walking paths during  winter.  Clean up any spills in your garage right away. This includes oil or grease spills. What can I do in the bathroom?  Use night lights.  Install grab bars by the toilet and in the tub and shower. Do not use towel bars as grab bars.  Use non-skid mats or decals in the tub or shower.  If you need to sit down in the shower, use a plastic, non-slip stool.  Keep the floor dry. Clean up any water that spills on the floor as soon as it happens.  Remove soap buildup in the tub or shower regularly.  Attach bath mats securely with double-sided non-slip rug tape.  Do not have throw rugs and other things on the floor that can make you trip. What can I do in the bedroom?  Use night lights.  Make sure that you have a light by your bed that is easy to reach.  Do not use any sheets or blankets that are too big for your bed. They should not hang down onto the floor.  Have a firm chair that has side arms. You can use  this for support while you get dressed.  Do not have throw rugs and other things on the floor that can make you trip. What can I do in the kitchen?  Clean up any spills right away.  Avoid walking on wet floors.  Keep items that you use a lot in easy-to-reach places.  If you need to reach something above you, use a strong step stool that has a grab bar.  Keep electrical cords out of the way.  Do not use floor polish or wax that makes floors slippery. If you must use wax, use non-skid floor wax.  Do not have throw rugs and other things on the floor that can make you trip. What can I do with my stairs?  Do not leave any items on the stairs.  Make sure that there are handrails on both sides of the stairs and use them. Fix handrails that are broken or loose. Make sure that handrails are as long as the stairways.  Check any carpeting to make sure that it is firmly attached to the stairs. Fix any carpet that is loose or worn.  Avoid having throw rugs at the top or  bottom of the stairs. If you do have throw rugs, attach them to the floor with carpet tape.  Make sure that you have a light switch at the top of the stairs and the bottom of the stairs. If you do not have them, ask someone to add them for you. What else can I do to help prevent falls?  Wear shoes that:  Do not have high heels.  Have rubber bottoms.  Are comfortable and fit you well.  Are closed at the toe. Do not wear sandals.  If you use a stepladder:  Make sure that it is fully opened. Do not climb a closed stepladder.  Make sure that both sides of the stepladder are locked into place.  Ask someone to hold it for you, if possible.  Clearly mark and make sure that you can see:  Any grab bars or handrails.  First and last steps.  Where the edge of each step is.  Use tools that help you move around (mobility aids) if they are needed. These include:  Canes.  Walkers.  Scooters.  Crutches.  Turn on the lights when you go into a dark area. Replace any light bulbs as soon as they burn out.  Set up your furniture so you have a clear path. Avoid moving your furniture around.  If any of your floors are uneven, fix them.  If there are any pets around you, be aware of where they are.  Review your medicines with your doctor. Some medicines can make you feel dizzy. This can increase your chance of falling. Ask your doctor what other things that you can do to help prevent falls. This information is not intended to replace advice given to you by your health care provider. Make sure you discuss any questions you have with your health care provider. Document Released: 01/21/2009 Document Revised: 09/02/2015 Document Reviewed: 05/01/2014 Elsevier Interactive Patient Education  2017 [employer].

## 2020-05-09 ENCOUNTER — Other Ambulatory Visit: Payer: Self-pay | Admitting: Internal Medicine

## 2020-05-09 DIAGNOSIS — E531 Pyridoxine deficiency: Secondary | ICD-10-CM

## 2020-05-17 DIAGNOSIS — M25519 Pain in unspecified shoulder: Secondary | ICD-10-CM | POA: Diagnosis not present

## 2020-05-17 DIAGNOSIS — G894 Chronic pain syndrome: Secondary | ICD-10-CM | POA: Diagnosis not present

## 2020-05-17 DIAGNOSIS — Z79899 Other long term (current) drug therapy: Secondary | ICD-10-CM | POA: Diagnosis not present

## 2020-05-17 DIAGNOSIS — M47816 Spondylosis without myelopathy or radiculopathy, lumbar region: Secondary | ICD-10-CM | POA: Diagnosis not present

## 2020-05-17 DIAGNOSIS — Z79891 Long term (current) use of opiate analgesic: Secondary | ICD-10-CM | POA: Diagnosis not present

## 2020-05-17 DIAGNOSIS — M792 Neuralgia and neuritis, unspecified: Secondary | ICD-10-CM | POA: Diagnosis not present

## 2020-05-19 DIAGNOSIS — I739 Peripheral vascular disease, unspecified: Secondary | ICD-10-CM | POA: Diagnosis not present

## 2020-05-19 DIAGNOSIS — I724 Aneurysm of artery of lower extremity: Secondary | ICD-10-CM | POA: Diagnosis not present

## 2020-05-19 DIAGNOSIS — I723 Aneurysm of iliac artery: Secondary | ICD-10-CM | POA: Diagnosis not present

## 2020-05-28 DIAGNOSIS — I739 Peripheral vascular disease, unspecified: Secondary | ICD-10-CM | POA: Diagnosis not present

## 2020-05-28 DIAGNOSIS — I723 Aneurysm of iliac artery: Secondary | ICD-10-CM | POA: Diagnosis not present

## 2020-06-10 ENCOUNTER — Other Ambulatory Visit: Payer: Self-pay | Admitting: Internal Medicine

## 2020-06-10 DIAGNOSIS — N200 Calculus of kidney: Secondary | ICD-10-CM

## 2020-06-10 DIAGNOSIS — R39198 Other difficulties with micturition: Secondary | ICD-10-CM

## 2020-06-10 DIAGNOSIS — G8929 Other chronic pain: Secondary | ICD-10-CM

## 2020-06-10 DIAGNOSIS — M5440 Lumbago with sciatica, unspecified side: Secondary | ICD-10-CM

## 2020-06-10 DIAGNOSIS — E119 Type 2 diabetes mellitus without complications: Secondary | ICD-10-CM | POA: Diagnosis not present

## 2020-06-11 DIAGNOSIS — M25519 Pain in unspecified shoulder: Secondary | ICD-10-CM | POA: Diagnosis not present

## 2020-06-11 DIAGNOSIS — M47816 Spondylosis without myelopathy or radiculopathy, lumbar region: Secondary | ICD-10-CM | POA: Diagnosis not present

## 2020-06-11 DIAGNOSIS — M792 Neuralgia and neuritis, unspecified: Secondary | ICD-10-CM | POA: Diagnosis not present

## 2020-06-11 DIAGNOSIS — G894 Chronic pain syndrome: Secondary | ICD-10-CM | POA: Diagnosis not present

## 2020-06-20 DIAGNOSIS — E785 Hyperlipidemia, unspecified: Secondary | ICD-10-CM | POA: Diagnosis not present

## 2020-06-20 DIAGNOSIS — G629 Polyneuropathy, unspecified: Secondary | ICD-10-CM | POA: Diagnosis not present

## 2020-06-20 DIAGNOSIS — I723 Aneurysm of iliac artery: Secondary | ICD-10-CM | POA: Diagnosis not present

## 2020-06-20 DIAGNOSIS — F17210 Nicotine dependence, cigarettes, uncomplicated: Secondary | ICD-10-CM | POA: Diagnosis not present

## 2020-06-20 DIAGNOSIS — I509 Heart failure, unspecified: Secondary | ICD-10-CM | POA: Diagnosis not present

## 2020-06-20 DIAGNOSIS — Z95810 Presence of automatic (implantable) cardiac defibrillator: Secondary | ICD-10-CM | POA: Diagnosis not present

## 2020-06-20 DIAGNOSIS — Z20822 Contact with and (suspected) exposure to covid-19: Secondary | ICD-10-CM | POA: Diagnosis not present

## 2020-06-20 DIAGNOSIS — Z9889 Other specified postprocedural states: Secondary | ICD-10-CM | POA: Diagnosis not present

## 2020-06-20 DIAGNOSIS — I252 Old myocardial infarction: Secondary | ICD-10-CM | POA: Diagnosis not present

## 2020-06-20 DIAGNOSIS — I739 Peripheral vascular disease, unspecified: Secondary | ICD-10-CM | POA: Diagnosis not present

## 2020-06-20 DIAGNOSIS — I2510 Atherosclerotic heart disease of native coronary artery without angina pectoris: Secondary | ICD-10-CM | POA: Diagnosis not present

## 2020-06-20 DIAGNOSIS — I110 Hypertensive heart disease with heart failure: Secondary | ICD-10-CM | POA: Diagnosis not present

## 2020-06-24 ENCOUNTER — Telehealth: Payer: Self-pay

## 2020-06-24 NOTE — Telephone Encounter (Signed)
Transition Care Management Unsuccessful Follow-up Telephone Call  Date of discharge and from where:  06/23/20 from [facility].  Attempts:  1st Attempt  Reason for unsuccessful TCM follow-up call:  Left voice message. Will follow.

## 2020-06-25 NOTE — Telephone Encounter (Signed)
Pt called back returning your call °

## 2020-06-25 NOTE — Telephone Encounter (Signed)
Transition Care Management Follow-up Telephone Call  Date of discharge and from where: 06/23/20 from [facility]  How have you been since you were released from the hospital? Patient states, "It is difficult sleeping and I am taking ambien as directed.  Taking tylenol as directed for pain; pain eases off for awhile." Denies nausea, vomiting, diarrhea, discharge at site. Site is cleaned appropriate and dry. Patient chooses to come into the office verses virtual for follow up.  Any questions or concerns? No  Items Reviewed:  Did the pt receive and understand the discharge instructions provided? Yes   Medications obtained and verified? Yes , notes wife is managing and she reports no questions or concerns.   Any new allergies since your discharge? No  Dietary orders reviewed? Yes  Do you have support at home? Yes , wife  Home Care and Equipment/Supplies: Were home health services ordered? No Were any new equipment or medical supplies ordered? No  Functional Questionnaire: (I = Independent and D = Dependent) ADLs: Wife assist as needed.   Bathing/Dressing- D- wife assist  Meal Prep- D- wife assist  Eating- I  Maintaining continence- I  Transferring/Ambulation- Walker  Managing Meds- D- Wife assist  Follow up appointments reviewed:   PCP Hospital f/u appt confirmed? Yes  Scheduled to see Dr. French Ana McLean-Scocuzza on 07/01/20 @ 10:00.  Specialist Hospital f/u appt confirmed? Yes , Appointment with [facility] on 06/29/20, virtual visit.   Are transportation arrangements needed? No   If their condition worsens, is the pt aware to call PCP or go to the Emergency Dept.? Yes  Was the patient provided with contact information for the PCP's office or ED? Yes  Was to pt encouraged to call back with questions or concerns? Yes

## 2020-06-29 DIAGNOSIS — Z95810 Presence of automatic (implantable) cardiac defibrillator: Secondary | ICD-10-CM | POA: Diagnosis not present

## 2020-07-01 ENCOUNTER — Ambulatory Visit (INDEPENDENT_AMBULATORY_CARE_PROVIDER_SITE_OTHER): Payer: Medicare Other | Admitting: Internal Medicine

## 2020-07-01 ENCOUNTER — Encounter: Payer: Self-pay | Admitting: Internal Medicine

## 2020-07-01 ENCOUNTER — Other Ambulatory Visit: Payer: Self-pay

## 2020-07-01 VITALS — BP 122/88 | HR 99 | Temp 98.3°F | Ht 69.0 in | Wt 130.0 lb

## 2020-07-01 DIAGNOSIS — Z8679 Personal history of other diseases of the circulatory system: Secondary | ICD-10-CM

## 2020-07-01 DIAGNOSIS — Z9889 Other specified postprocedural states: Secondary | ICD-10-CM

## 2020-07-01 DIAGNOSIS — I723 Aneurysm of iliac artery: Secondary | ICD-10-CM

## 2020-07-01 NOTE — Patient Instructions (Addendum)
07/28/2020 Appointment Vascular Surgery    07/28/2020 Post Op [facility], [place], [place]  [address] [facility] [address] VASCULAR SURGERY  [place], [place] [address]  [phone] (Work)  [phone] (Fax)     Dr. Marcell Barlow  Dr. Lucy Chris * From [facility] but neurosurgery   [phone] Not available [address] [place] [address]      Specialties     Neurosurgery            Additional Contact Information  Phone Fax E-mail Address  [phone] Not available [address]   [facility]   [place] [address]     [phone] Not available Not available [address]   [place] [address]

## 2020-07-01 NOTE — Progress Notes (Signed)
Chief Complaint  Patient presents with  . Follow-up    HFU; pain still 9/10, mostly L side pain that radiates.    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  HENT: Negative for hearing loss.   Eyes: Negative for blurred vision.  Respiratory: Negative for shortness of breath.   Cardiovascular: Negative for chest pain.  Musculoskeletal: Positive for back pain, joint pain and neck pain.       +left groin pain  Skin: Negative for rash.   Past Medical History:  Diagnosis Date  . Allergy   . CAD (coronary artery disease)   . Depression   . Frequent headaches   . GERD (gastroesophageal reflux disease)   . Headache   . Hyperlipidemia   . Hypertension   . PAD (peripheral artery disease) (HCC)   . Tobacco abuse    Past Surgical History:  Procedure Laterality Date  . COLONOSCOPY WITH PROPOFOL N/A 06/20/2017   Procedure: COLONOSCOPY WITH PROPOFOL;  Surgeon: Wyline Mood, MD;  Location: [facility] ENDOSCOPY;  Service: Gastroenterology;  Laterality: N/A;  . COLONOSCOPY WITH PROPOFOL N/A 07/24/2017   Procedure: COLONOSCOPY WITH PROPOFOL;  Surgeon: Wyline Mood, MD;  Location: [facility] ENDOSCOPY;  Service: Gastroenterology;  Laterality: N/A;  . ESOPHAGOGASTRODUODENOSCOPY (EGD) WITH PROPOFOL N/A 06/20/2017   Procedure: ESOPHAGOGASTRODUODENOSCOPY (EGD) WITH PROPOFOL;  Surgeon: Wyline Mood, MD;  Location: [facility] ENDOSCOPY;  Service: Gastroenterology;  Laterality: N/A;  . PERCUTANEOUS  CORONARY STENT INTERVENTION (PCI-S)    . STOMACH SURGERY     2009 [facility] per chart review pt had AAA repair   . TONSILLECTOMY     Family History  Problem Relation Age of Onset  . Diabetes Mother   . Heart disease Mother   . Hypertension Mother   . Stroke Mother   . Prostate cancer Father   . Cancer Father        prostate cancer in 46s  . Diabetes Sister   . Heart disease Sister   . Ulcers Other        unknown who had ulcers in family    Social History   Socioeconomic History  . Marital status: Married    Spouse name: Not on file  . Number of children: Not on file  . Years of education: Not on file  . Highest education level: Not on file  Occupational History  . Not on file  Tobacco Use  . Smoking status: Current Every Day Smoker    Packs/day: 0.25    Types: Cigarettes  . Smokeless tobacco: Never Used  Vaping Use  . Vaping Use: Never used  Substance and Sexual Activity  . Alcohol use: No    Alcohol/week: 0.0 standard drinks    Comment: quit 1 year ago  . Drug use: No  . Sexual activity: Not Currently  Other Topics Concern  . Not on file  Social History Narrative   Disabled    4 kids 2 living    Used to [employer] work  Smoker since age 76 max 2 pk per week now 1 ppd as of 02/2017    Married    Social Determinants of Corporate investment banker Strain: Not on file  Food Insecurity: Not on file  Transportation Needs: Not on file  Physical Activity: Not on file  Stress: Not on file  Social Connections: Not on file  Intimate Partner Violence: Not on file   Current Meds  Medication Sig  . albuterol (VENTOLIN HFA) 108 (90 Base) MCG/ACT inhaler Inhale 1-2 puffs into the lungs every 6 (six) hours as needed for wheezing or shortness of breath. RUSH order please  . atorvastatin (LIPITOR) 80 MG tablet Take 1 tablet (80 mg total) by mouth daily at 6 PM.  . Baclofen 5 MG TABS Take 1 tablet by mouth twice daily.  . Cholecalciferol 1.25 MG (50000 UT) capsule Take 1  capsule (50,000 Units total) by mouth once a week.  . clopidogrel (PLAVIX) 75 MG tablet 1 pill daily  . cyanocobalamin 100 MCG tablet Take 1 tablet by mouth daily.  . finasteride (PROSCAR) 5 MG tablet Take 1 tablet (5 mg total) by mouth daily.  . fluticasone (FLONASE) 50 MCG/ACT nasal spray Place 1-2 sprays into both nostrils daily.  . furosemide (LASIX) 20 MG tablet Take 20 mg by mouth daily.  Marland Kitchen gabapentin (NEURONTIN) 300 MG capsule 1 capsule 3 (three) times daily  . ipratropium (ATROVENT) 0.02 % nebulizer solution Inhale into the lungs.  . isosorbide mononitrate (IMDUR) 60 MG 24 hr tablet Take 1 tablet by mouth in the morning, at noon, and at bedtime.  Marland Kitchen levocetirizine (XYZAL) 5 MG tablet Take 1 tablet (5 mg total) by mouth every evening.  Marland Kitchen lisinopril (ZESTRIL) 5 MG tablet 1 pill daily  . metoprolol succinate (TOPROL-XL) 25 MG 24 hr tablet Take 1 tablet (25 mg total) by mouth daily.  . mirtazapine (REMERON) 30 MG tablet 1 pill nightly  . nitroGLYCERIN (NITROSTAT) 0.4 MG SL tablet Place 1 tablet (0.4 mg total) under the tongue every 5 (five) minutes as needed for chest pain.  Marland Kitchen oxyCODONE-acetaminophen (PERCOCET) 10-325 MG tablet   . pantoprazole (PROTONIX) 40 MG tablet Take 1 tablet (40 mg total) by mouth daily. 30 minutes before food  . potassium chloride (KLOR-CON) 10 MEQ tablet Take 10 mEq by mouth daily.  Marland Kitchen pyridoxine (B-6) 100 MG tablet Take 1 tablet by mouth daily.  . sodium chloride (OCEAN) 0.65 % SOLN nasal spray Place 2 sprays into both nostrils as needed for congestion.  . tamsulosin (FLOMAX) 0.4 MG CAPS capsule Take 2 capsules by mouth daily after supper.  . zolpidem (AMBIEN) 10 MG tablet Take 1 tablet (10 mg total) by mouth at bedtime as needed for sleep.   No Known Allergies No results found for this or any previous visit (from the past 2160 hour(s)). Objective  Body mass index is 19.2 kg/m. Wt Readings from Last 3 Encounters:  07/01/20 130 lb (59 kg)  04/22/20 133 lb  (60.3 kg)  04/20/20 133 lb 9.6 oz (60.6 kg)   Temp Readings from Last 3 Encounters:  07/01/20 98.3 F (36.8 C)  04/20/20 97.6 F (36.4 C) (Oral)  10/17/19 98.3 F (36.8 C) (Oral)   BP Readings from Last 3 Encounters:  07/01/20 122/88  04/20/20 120/76  10/17/19 106/70   Pulse Readings from Last 3 Encounters:  07/01/20 99  04/20/20 73  10/17/19 86    Physical Exam Vitals and nursing note reviewed.  Constitutional:  Appearance: Normal appearance. He is well-developed and well-groomed.  HENT:     Head: Normocephalic and atraumatic.  Eyes:     Conjunctiva/sclera: Conjunctivae normal.     Pupils: Pupils are equal, round, and reactive to light.  Cardiovascular:     Rate and Rhythm: Normal rate and regular rhythm.     Heart sounds: Normal heart sounds. No murmur heard.   Pulmonary:     Effort: Pulmonary effort is normal.     Breath sounds: Normal breath sounds.  Abdominal:     General: Abdomen is flat. Bowel sounds are normal.     Tenderness: There is no abdominal tenderness.  Skin:    General: Skin is warm and dry.  Neurological:     General: No focal deficit present.     Mental Status: He is alert and oriented to person, place, and time. Mental status is at baseline.     Gait: Gait normal.  Psychiatric:        Attention and Perception: Attention and perception normal.        Mood and Affect: Mood and affect normal.        Speech: Speech normal.        Behavior: Behavior normal. Behavior is cooperative.        Thought Content: Thought content normal.        Cognition and Memory: Cognition and memory normal.        Judgment: Judgment normal.     Assessment  Plan  Hx of aortic aneurysm repair Aneurysm of iliac artery (HCC)  Checked site and redressed with telfa and tegaderm  Post op appt 07/28/20   06/22/20 Procedure(s): 1. Ultrasound guided access to left common femoral artery 2. Abdominal Aortogram 3. Selective catheterization of left internal iliac  artery 4. Coil embolization of left internal iliac artery aneurysm 5. Implantation of 69mmx29mm left common iliac artery VBX  6. Implantation of 16x12x7 endoprosthesis to left external iliac artery 7. Proglide closure of left common femoral artery arteriotomy  Declines flu shot Had3/3 doses hep B check titerin futureconsider Will disc Tdap and shingrix in future moderna 3/3  prevnar disc today declines consider this in future with pna 23   PSA had11/20/20 2.19normal DRE in futurecheck PSA upcoming11/2021 Hep C negative -h/o hematuria CT 08/2017 with left kidney6 mmstone could be etiologyand CT 04/2019 with b/l nonobstructing kidney stones on CT L spine HIV neg 03/29/15  Colonoscopy 3 and 07/2017 hadpoor prep diverticulosis  -f/u GI 03/05/18 will ask if will order CT liver protocol for liver lesion -of note will [facility] never had CT ab/pelvis sch 11 or 03/2018 -per pts wife cardiology does not want further procedures for now and especially without their approvalpreviously and as of 02/26/19 no $ to f/u GI  Eye glasses best in [place] cataracts b/l eyes glasses helpseen in 2020/2021  [facility] cardiology  Urology Dr. Richardo Hanks  Provider: Dr. French Ana McLean-Scocuzza-Internal Medicine

## 2020-07-09 DIAGNOSIS — Z9889 Other specified postprocedural states: Secondary | ICD-10-CM | POA: Insufficient documentation

## 2020-07-09 DIAGNOSIS — I723 Aneurysm of iliac artery: Secondary | ICD-10-CM | POA: Insufficient documentation

## 2020-07-09 DIAGNOSIS — Z8679 Personal history of other diseases of the circulatory system: Secondary | ICD-10-CM

## 2020-07-09 HISTORY — DX: Aneurysm of iliac artery: I72.3

## 2020-07-09 HISTORY — DX: Personal history of other diseases of the circulatory system: Z86.79

## 2020-07-09 HISTORY — DX: Other specified postprocedural states: Z98.890

## 2020-07-14 DIAGNOSIS — M792 Neuralgia and neuritis, unspecified: Secondary | ICD-10-CM | POA: Diagnosis not present

## 2020-07-14 DIAGNOSIS — Z79891 Long term (current) use of opiate analgesic: Secondary | ICD-10-CM | POA: Diagnosis not present

## 2020-07-14 DIAGNOSIS — G894 Chronic pain syndrome: Secondary | ICD-10-CM | POA: Diagnosis not present

## 2020-07-14 DIAGNOSIS — M47816 Spondylosis without myelopathy or radiculopathy, lumbar region: Secondary | ICD-10-CM | POA: Diagnosis not present

## 2020-07-14 DIAGNOSIS — M25519 Pain in unspecified shoulder: Secondary | ICD-10-CM | POA: Diagnosis not present

## 2020-07-18 DIAGNOSIS — Z95810 Presence of automatic (implantable) cardiac defibrillator: Secondary | ICD-10-CM | POA: Diagnosis not present

## 2020-07-28 DIAGNOSIS — Z95828 Presence of other vascular implants and grafts: Secondary | ICD-10-CM | POA: Diagnosis not present

## 2020-07-28 DIAGNOSIS — I739 Peripheral vascular disease, unspecified: Secondary | ICD-10-CM | POA: Diagnosis not present

## 2020-07-28 DIAGNOSIS — I723 Aneurysm of iliac artery: Secondary | ICD-10-CM | POA: Diagnosis not present

## 2020-08-11 DIAGNOSIS — M19019 Primary osteoarthritis, unspecified shoulder: Secondary | ICD-10-CM | POA: Diagnosis not present

## 2020-08-11 DIAGNOSIS — M47816 Spondylosis without myelopathy or radiculopathy, lumbar region: Secondary | ICD-10-CM | POA: Diagnosis not present

## 2020-08-11 DIAGNOSIS — G894 Chronic pain syndrome: Secondary | ICD-10-CM | POA: Diagnosis not present

## 2020-08-11 DIAGNOSIS — M792 Neuralgia and neuritis, unspecified: Secondary | ICD-10-CM | POA: Diagnosis not present

## 2020-08-24 DIAGNOSIS — M19019 Primary osteoarthritis, unspecified shoulder: Secondary | ICD-10-CM | POA: Diagnosis not present

## 2020-08-27 ENCOUNTER — Other Ambulatory Visit: Payer: Self-pay | Admitting: Urology

## 2020-09-08 DIAGNOSIS — G5620 Lesion of ulnar nerve, unspecified upper limb: Secondary | ICD-10-CM | POA: Diagnosis not present

## 2020-09-08 DIAGNOSIS — M19019 Primary osteoarthritis, unspecified shoulder: Secondary | ICD-10-CM | POA: Diagnosis not present

## 2020-09-08 DIAGNOSIS — G894 Chronic pain syndrome: Secondary | ICD-10-CM | POA: Diagnosis not present

## 2020-09-08 DIAGNOSIS — M792 Neuralgia and neuritis, unspecified: Secondary | ICD-10-CM | POA: Diagnosis not present

## 2020-09-11 ENCOUNTER — Other Ambulatory Visit: Payer: Self-pay | Admitting: Internal Medicine

## 2020-09-11 DIAGNOSIS — N200 Calculus of kidney: Secondary | ICD-10-CM

## 2020-09-11 DIAGNOSIS — J309 Allergic rhinitis, unspecified: Secondary | ICD-10-CM

## 2020-09-11 DIAGNOSIS — R39198 Other difficulties with micturition: Secondary | ICD-10-CM

## 2020-09-21 DIAGNOSIS — G5620 Lesion of ulnar nerve, unspecified upper limb: Secondary | ICD-10-CM | POA: Diagnosis not present

## 2020-10-01 ENCOUNTER — Other Ambulatory Visit: Payer: Self-pay | Admitting: Internal Medicine

## 2020-10-01 DIAGNOSIS — G4700 Insomnia, unspecified: Secondary | ICD-10-CM

## 2020-10-01 NOTE — Telephone Encounter (Signed)
This medicine was refilled on 09/11/2020.  He should not need a refill yet.  This can be refilled by his PCP when she is back in the office.

## 2020-10-05 NOTE — Telephone Encounter (Signed)
Can you refuse? This duplicate was filled 09/12/20.

## 2020-10-08 DIAGNOSIS — M19019 Primary osteoarthritis, unspecified shoulder: Secondary | ICD-10-CM | POA: Diagnosis not present

## 2020-10-08 DIAGNOSIS — M4722 Other spondylosis with radiculopathy, cervical region: Secondary | ICD-10-CM | POA: Diagnosis not present

## 2020-10-08 DIAGNOSIS — G894 Chronic pain syndrome: Secondary | ICD-10-CM | POA: Diagnosis not present

## 2020-10-08 DIAGNOSIS — G5620 Lesion of ulnar nerve, unspecified upper limb: Secondary | ICD-10-CM | POA: Diagnosis not present

## 2020-10-08 DIAGNOSIS — Z79891 Long term (current) use of opiate analgesic: Secondary | ICD-10-CM | POA: Diagnosis not present

## 2020-10-08 DIAGNOSIS — Z79899 Other long term (current) drug therapy: Secondary | ICD-10-CM | POA: Diagnosis not present

## 2020-10-18 ENCOUNTER — Other Ambulatory Visit: Payer: Medicare Other

## 2020-10-19 ENCOUNTER — Telehealth: Payer: Self-pay | Admitting: Internal Medicine

## 2020-10-19 NOTE — Telephone Encounter (Signed)
NIKE, [phone]. Please resend prescription for 30 days for zolpidem (AMBIEN) 10 MG tablet. Partial shippment was sent out and was lost and patient never received shipment. [facility] pharmacy is now requesting a 30 day supply new prescription base on the lose of medication.

## 2020-10-21 ENCOUNTER — Other Ambulatory Visit: Payer: Self-pay | Admitting: Internal Medicine

## 2020-10-21 DIAGNOSIS — G4700 Insomnia, unspecified: Secondary | ICD-10-CM

## 2020-10-21 MED ORDER — ZOLPIDEM TARTRATE 10 MG PO TABS
10.0000 mg | ORAL_TABLET | Freq: Every evening | ORAL | 2 refills | Status: DC | PRN
Start: 2020-10-21 — End: 2021-04-05

## 2020-10-21 NOTE — Telephone Encounter (Signed)
Sent to pharmacy sorry for his loss

## 2020-10-21 NOTE — Telephone Encounter (Signed)
Patient informed and verbalized understanding

## 2020-10-21 NOTE — Telephone Encounter (Signed)
PT called to request a refill of their zolpidem (AMBIEN) 10 MG tablet to help them sleep. They also wanted to apologize for missing their apt on Monday. Their Mother In Law passed away and they couldn't get much sleep.

## 2020-10-28 ENCOUNTER — Other Ambulatory Visit (INDEPENDENT_AMBULATORY_CARE_PROVIDER_SITE_OTHER): Payer: Medicare Other

## 2020-10-28 ENCOUNTER — Other Ambulatory Visit: Payer: Self-pay

## 2020-10-28 DIAGNOSIS — Z1329 Encounter for screening for other suspected endocrine disorder: Secondary | ICD-10-CM

## 2020-10-28 DIAGNOSIS — Z125 Encounter for screening for malignant neoplasm of prostate: Secondary | ICD-10-CM

## 2020-10-28 DIAGNOSIS — E559 Vitamin D deficiency, unspecified: Secondary | ICD-10-CM | POA: Diagnosis not present

## 2020-10-28 DIAGNOSIS — Z0000 Encounter for general adult medical examination without abnormal findings: Secondary | ICD-10-CM

## 2020-10-28 DIAGNOSIS — I10 Essential (primary) hypertension: Secondary | ICD-10-CM

## 2020-10-28 LAB — COMPREHENSIVE METABOLIC PANEL
ALT: 16 U/L (ref 0–53)
AST: 21 U/L (ref 0–37)
Albumin: 4.3 g/dL (ref 3.5–5.2)
Alkaline Phosphatase: 50 U/L (ref 39–117)
BUN: 20 mg/dL (ref 6–23)
CO2: 28 mEq/L (ref 19–32)
Calcium: 9.4 mg/dL (ref 8.4–10.5)
Chloride: 102 mEq/L (ref 96–112)
Creatinine, Ser: 1.11 mg/dL (ref 0.40–1.50)
GFR: 69.53 mL/min (ref >60.00)
Glucose, Bld: 73 mg/dL (ref 70–99)
Potassium: 4.3 mEq/L (ref 3.5–5.1)
Sodium: 140 mEq/L (ref 135–145)
Total Bilirubin: 0.3 mg/dL (ref 0.2–1.2)
Total Protein: 6.6 g/dL (ref 6.0–8.3)

## 2020-10-28 LAB — CBC WITH DIFFERENTIAL/PLATELET
Basophils Absolute: 0 10*3/uL (ref 0.0–0.1)
Basophils Relative: 0.4 % (ref 0.0–3.0)
Eosinophils Absolute: 0.1 10*3/uL (ref 0.0–0.7)
Eosinophils Relative: 1.4 % (ref 0.0–5.0)
HCT: 40.3 % (ref 39.0–52.0)
Hemoglobin: 13 g/dL (ref 13.0–17.0)
Lymphocytes Relative: 41 % (ref 12.0–46.0)
Lymphs Abs: 2.4 10*3/uL (ref 0.7–4.0)
MCHC: 32.3 g/dL (ref 30.0–36.0)
MCV: 91.1 fl (ref 78.0–100.0)
Monocytes Absolute: 0.8 10*3/uL (ref 0.1–1.0)
Monocytes Relative: 13.2 % — ABNORMAL HIGH (ref 3.0–12.0)
Neutro Abs: 2.5 10*3/uL (ref 1.4–7.7)
Neutrophils Relative %: 44 % (ref 43.0–77.0)
Platelets: 282 10*3/uL (ref 150.0–400.0)
RBC: 4.43 Mil/uL (ref 4.22–5.81)
RDW: 18.3 % — ABNORMAL HIGH (ref 11.5–15.5)
WBC: 5.8 10*3/uL (ref 4.0–10.5)

## 2020-10-28 LAB — VITAMIN D 25 HYDROXY (VIT D DEFICIENCY, FRACTURES): VITD: 24.34 ng/mL — ABNORMAL LOW (ref 30.00–100.00)

## 2020-10-28 LAB — LIPID PANEL
Cholesterol: 103 mg/dL (ref 0–200)
HDL: 37.1 mg/dL — ABNORMAL LOW (ref >39.00)
LDL Cholesterol: 50 mg/dL (ref 0–99)
NonHDL: 65.45
Total CHOL/HDL Ratio: 3
Triglycerides: 76 mg/dL (ref 0.0–149.0)
VLDL: 15.2 mg/dL (ref 0.0–40.0)

## 2020-10-28 LAB — TSH: TSH: 1.45 u[IU]/mL (ref 0.35–5.50)

## 2020-10-28 LAB — PSA: PSA: 1.83 ng/mL (ref 0.10–4.00)

## 2020-11-01 ENCOUNTER — Other Ambulatory Visit: Payer: Self-pay | Admitting: Internal Medicine

## 2020-11-01 DIAGNOSIS — E559 Vitamin D deficiency, unspecified: Secondary | ICD-10-CM

## 2020-11-01 MED ORDER — CHOLECALCIFEROL 1.25 MG (50000 UT) PO CAPS: 50000.0000 [IU] | ORAL_CAPSULE | ORAL | 1 refills | Status: DC

## 2020-11-03 ENCOUNTER — Encounter: Payer: Self-pay | Admitting: Internal Medicine

## 2020-11-03 ENCOUNTER — Ambulatory Visit (INDEPENDENT_AMBULATORY_CARE_PROVIDER_SITE_OTHER): Payer: Medicare Other | Admitting: Internal Medicine

## 2020-11-03 ENCOUNTER — Other Ambulatory Visit: Payer: Self-pay

## 2020-11-03 VITALS — BP 110/70 | HR 79 | Temp 97.0°F | Ht 69.0 in | Wt 128.4 lb

## 2020-11-03 DIAGNOSIS — G4700 Insomnia, unspecified: Secondary | ICD-10-CM

## 2020-11-03 DIAGNOSIS — M25511 Pain in right shoulder: Secondary | ICD-10-CM | POA: Diagnosis not present

## 2020-11-03 DIAGNOSIS — D72821 Monocytosis (symptomatic): Secondary | ICD-10-CM | POA: Insufficient documentation

## 2020-11-03 DIAGNOSIS — R39198 Other difficulties with micturition: Secondary | ICD-10-CM | POA: Diagnosis not present

## 2020-11-03 DIAGNOSIS — K2970 Gastritis, unspecified, without bleeding: Secondary | ICD-10-CM

## 2020-11-03 DIAGNOSIS — M5442 Lumbago with sciatica, left side: Secondary | ICD-10-CM | POA: Diagnosis not present

## 2020-11-03 DIAGNOSIS — M542 Cervicalgia: Secondary | ICD-10-CM

## 2020-11-03 DIAGNOSIS — M25551 Pain in right hip: Secondary | ICD-10-CM | POA: Diagnosis not present

## 2020-11-03 DIAGNOSIS — I2510 Atherosclerotic heart disease of native coronary artery without angina pectoris: Secondary | ICD-10-CM | POA: Diagnosis not present

## 2020-11-03 DIAGNOSIS — M25512 Pain in left shoulder: Secondary | ICD-10-CM

## 2020-11-03 DIAGNOSIS — N200 Calculus of kidney: Secondary | ICD-10-CM

## 2020-11-03 DIAGNOSIS — F32A Depression, unspecified: Secondary | ICD-10-CM

## 2020-11-03 DIAGNOSIS — M5441 Lumbago with sciatica, right side: Secondary | ICD-10-CM

## 2020-11-03 DIAGNOSIS — F4321 Adjustment disorder with depressed mood: Secondary | ICD-10-CM

## 2020-11-03 DIAGNOSIS — M25552 Pain in left hip: Secondary | ICD-10-CM

## 2020-11-03 HISTORY — DX: Monocytosis (symptomatic): D72.821

## 2020-11-03 MED ORDER — PANTOPRAZOLE SODIUM 40 MG PO TBEC: 40.0000 mg | DELAYED_RELEASE_TABLET | Freq: Every day | ORAL | 3 refills | Status: DC

## 2020-11-03 MED ORDER — TAMSULOSIN HCL 0.4 MG PO CAPS: ORAL_CAPSULE | ORAL | 3 refills | Status: DC

## 2020-11-03 MED ORDER — MIRTAZAPINE 30 MG PO TABS: ORAL_TABLET | ORAL | 3 refills | Status: DC

## 2020-11-03 MED ORDER — METOPROLOL SUCCINATE ER 25 MG PO TB24: 25.0000 mg | ORAL_TABLET | Freq: Every day | ORAL | 3 refills | Status: DC

## 2020-11-03 NOTE — Patient Instructions (Addendum)
PT Martin Hernandez vaccine 4th dose is recommended    Results for Martin Hernandez, Martin Hernandez (MRN [id number]) as of 11/03/2020 11:37  Ref. Range 10/28/2020 08:38  WBC Latest Ref Range: 4.0 - 10.5 K/uL 5.8  RBC Latest Ref Range: 4.22 - 5.81 Mil/uL 4.43  Hemoglobin Latest Ref Range: 13.0 - 17.0 g/dL 40.3  HCT Latest Ref Range: 39.0 - 52.0 % 40.3  MCV Latest Ref Range: 78.0 - 100.0 fl 91.1  MCHC Latest Ref Range: 30.0 - 36.0 g/dL 47.4  RDW Latest Ref Range: 11.5 - 15.5 % 18.3 (H)  Platelets Latest Ref Range: 150.0 - 400.0 K/uL 282.0  Neutrophils Latest Ref Range: 43.0 - 77.0 % 44.0  Lymphocytes Latest Ref Range: 12.0 - 46.0 % 41.0  Monocytes Relative Latest Ref Range: 3.0 - 12.0 % 13.2 (H)  Eosinophil Latest Ref Range: 0.0 - 5.0 % 1.4  Basophil Latest Ref Range: 0.0 - 3.0 % 0.4  NEUT# Latest Ref Range: 1.4 - 7.7 K/uL 2.5  Lymphocyte # Latest Ref Range: 0.7 - 4.0 K/uL 2.4  Monocyte # Latest Ref Range: 0.1 - 1.0 K/uL 0.8  Eosinophils Absolute Latest Ref Range: 0.0 - 0.7 K/uL 0.1  Basophils Absolute Latest Ref Range: 0.0 - 0.1 K/uL 0.0    Results for Martin Hernandez, Martin Hernandez (MRN [id number]) as of 11/03/2020 11:38  Ref. Range 10/28/2020 08:38  Neutrophils Latest Ref Range: 43.0 - 77.0 % 44.0  Lymphocytes Latest Ref Range: 12.0 - 46.0 % 41.0  Monocytes Relative Latest Ref Range: 3.0 - 12.0 % 13.2 (H)  Eosinophil Latest Ref Range: 0.0 - 5.0 % 1.4  Basophil Latest Ref Range: 0.0 - 3.0 % 0.4  NEUT# Latest Ref Range: 1.4 - 7.7 K/uL 2.5  Lymphocyte # Latest Ref Range: 0.7 - 4.0 K/uL 2.4  Monocyte # Latest Ref Range: 0.1 - 1.0 K/uL 0.8  Eosinophils Absolute Latest Ref Range: 0.0 - 0.7 K/uL 0.1  Basophils Absolute Latest Ref Range: 0.0 - 0.1 K/uL 0.0

## 2020-11-03 NOTE — Progress Notes (Signed)
Chief Complaint  Patient presents with   Follow-up   F/u with wife  1. Grief mother in law died October 29, 2020 still processing  2. Reviewed labs elevated monocytes declines hematology for now per wife and wants doctors at [facility] to reviewe 3. Chronic neck, b/l shoulder, b/l hip and low back pain, hands had b/l injections in wrists but hands numb x 2 weeks and had inj in left shoulder which helped pending right shoulder goes to preferred pain clinic in [place]  Pt wants referral to [facility]   4. Cad cards f/u 11/25/20 [facility] and vascular aneurysm f/u 01/26/21 vascular at [facility] legs still hurting to be expected s/p vascular surgery  He has had x 2 falls     Review of Systems  Constitutional:  Negative for weight loss.  HENT:  Negative for hearing loss.   Eyes:  Negative for blurred vision.  Respiratory:  Negative for shortness of breath.   Cardiovascular:  Negative for chest pain.       +b/l leg pain, chronic  Gastrointestinal:  Negative for abdominal pain.  Musculoskeletal:  Positive for back pain, falls, joint pain and neck pain.  Skin:  Negative for rash.  Neurological:  Negative for headaches.  Psychiatric/Behavioral:  The patient has insomnia.        Grief  Past Medical History:  Diagnosis Date   Allergy    CAD (coronary artery disease)    Depression    Frequent headaches    GERD (gastroesophageal reflux disease)    Headache    Hyperlipidemia    Hypertension    PAD (peripheral artery disease) (HCC)    Tobacco abuse    Past Surgical History:  Procedure Laterality Date   COLONOSCOPY WITH PROPOFOL N/A 06/20/2017   Procedure: COLONOSCOPY WITH PROPOFOL;  Surgeon: Wyline Mood, MD;  Location: [facility] ENDOSCOPY;  Service: Gastroenterology;  Laterality: N/A;   COLONOSCOPY WITH PROPOFOL N/A 07/24/2017   Procedure: COLONOSCOPY WITH PROPOFOL;  Surgeon: Wyline Mood, MD;  Location: [facility] ENDOSCOPY;  Service: Gastroenterology;  Laterality: N/A;   ESOPHAGOGASTRODUODENOSCOPY (EGD) WITH PROPOFOL N/A  06/20/2017   Procedure: ESOPHAGOGASTRODUODENOSCOPY (EGD) WITH PROPOFOL;  Surgeon: Wyline Mood, MD;  Location: [facility] ENDOSCOPY;  Service: Gastroenterology;  Laterality: N/A;   PERCUTANEOUS CORONARY STENT INTERVENTION (PCI-S)     STOMACH SURGERY     2009 [facility] per chart review pt had AAA repair    TONSILLECTOMY     Family History  Problem Relation Age of Onset   Diabetes Mother    Heart disease Mother    Hypertension Mother    Stroke Mother    Prostate cancer Father    Cancer Father        prostate cancer in 74s   Diabetes Sister    Heart disease Sister    Ulcers Other        unknown who had ulcers in family    Social History   Socioeconomic History   Marital status: Married    Spouse name: Not on file   Number of children: Not on file   Years of education: Not on file   Highest education level: Not on file  Occupational History   Not on file  Tobacco Use   Smoking status: Every Day    Packs/day: 0.25    Types: Cigarettes   Smokeless tobacco: Never  Vaping Use   Vaping Use: Never used  Substance and Sexual Activity   Alcohol use: No    Alcohol/week: 0.0 standard drinks    Comment:  quit 1 year ago   Drug use: No   Sexual activity: Not Currently  Other Topics Concern   Not on file  Social History Narrative   Disabled    4 kids 2 living    Used to do mill work    Smoker since age 3 max 2 pk per week now 1 ppd as of 02/2017    Married    Social Determinants of Corporate investment banker Strain: Not on file  Food Insecurity: Not on file  Transportation Needs: Not on file  Physical Activity: Not on file  Stress: Not on file  Social Connections: Not on file  Intimate Partner Violence: Not on file   Current Meds  Medication Sig   aspirin 81 MG tablet Take 81 mg by mouth daily.   atorvastatin (LIPITOR) 80 MG tablet Take 1 tablet (80 mg total) by mouth daily at 6 PM.   Baclofen 5 MG TABS Take 1 tablet by mouth twice daily.   clopidogrel (PLAVIX) 75 MG tablet 1  pill daily   furosemide (LASIX) 20 MG tablet Take 20 mg by mouth daily.   levocetirizine (XYZAL) 5 MG tablet Take 1 tablet by mouth every evening.   metoprolol succinate (TOPROL-XL) 25 MG 24 hr tablet Take 1 tablet (25 mg total) by mouth daily.   mirtazapine (REMERON) 30 MG tablet 1 pill nightly   nitroGLYCERIN (NITROSTAT) 0.4 MG SL tablet Place 1 tablet (0.4 mg total) under the tongue every 5 (five) minutes as needed for chest pain.   oxyCODONE-acetaminophen (PERCOCET) 10-325 MG tablet    pantoprazole (PROTONIX) 40 MG tablet Take 1 tablet (40 mg total) by mouth daily. 30 minutes before food   pyridoxine (B-6) 100 MG tablet Take 1 tablet by mouth daily.   tamsulosin (FLOMAX) 0.4 MG CAPS capsule Take 2 capsules by mouth daily after supper.   thiamine (VITAMIN B-1) 100 MG tablet Take 100 mg by mouth daily.   zolpidem (AMBIEN) 10 MG tablet Take 1 tablet (10 mg total) by mouth at bedtime as needed. for sleep   No Known Allergies Recent Results (from the past 2160 hour(s))  PSA     Status: None   Collection Time: 10/28/20  8:38 AM  Result Value Ref Range   PSA 1.83 0.10 - 4.00 ng/mL    Comment: Test performed using Access Hybritech PSA Assay, a parmagnetic partical, chemiluminecent immunoassay.  Comprehensive metabolic panel     Status: None   Collection Time: 10/28/20  8:38 AM  Result Value Ref Range   Sodium 140 135 - 145 mEq/L   Potassium 4.3 3.5 - 5.1 mEq/L   Chloride 102 96 - 112 mEq/L   CO2 28 19 - 32 mEq/L   Glucose, Bld 73 70 - 99 mg/dL   BUN 20 6 - 23 mg/dL   Creatinine, Ser 8.18 0.40 - 1.50 mg/dL   Total Bilirubin 0.3 0.2 - 1.2 mg/dL   Alkaline Phosphatase 50 39 - 117 U/L   AST 21 0 - 37 U/L   ALT 16 0 - 53 U/L   Total Protein 6.6 6.0 - 8.3 g/dL   Albumin 4.3 3.5 - 5.2 g/dL   GFR 56.31 >49.70 mL/min    Comment: Calculated using the CKD-EPI Creatinine Equation (2021)   Calcium 9.4 8.4 - 10.5 mg/dL  Lipid panel     Status: Abnormal   Collection Time: 10/28/20  8:38 AM   Result Value Ref Range   Cholesterol 103 0 - 200 mg/dL  Comment: ATP III Classification       Desirable:  < 200 mg/dL               Borderline High:  200 - 239 mg/dL          High:  > = 397 mg/dL   Triglycerides 67.3 0.0 - 149.0 mg/dL    Comment: Normal:  <419 mg/dLBorderline High:  150 - 199 mg/dL   HDL 37.90 (L) >24.09 mg/dL   VLDL 73.5 0.0 - 32.9 mg/dL   LDL Cholesterol 50 0 - 99 mg/dL   Total CHOL/HDL Ratio 3     Comment:                Men          Women1/2 Average Risk     3.4          3.3Average Risk          5.0          4.42X Average Risk          9.6          7.13X Average Risk          15.0          11.0                       NonHDL 65.45     Comment: NOTE:  Non-HDL goal should be 30 mg/dL higher than patient's LDL goal (i.e. LDL goal of < 70 mg/dL, would have non-HDL goal of < 100 mg/dL)  CBC with Differential/Platelet     Status: Abnormal   Collection Time: 10/28/20  8:38 AM  Result Value Ref Range   WBC 5.8 4.0 - 10.5 K/uL   RBC 4.43 4.22 - 5.81 Mil/uL   Hemoglobin 13.0 13.0 - 17.0 g/dL   HCT 92.4 26.8 - 34.1 %   MCV 91.1 78.0 - 100.0 fl   MCHC 32.3 30.0 - 36.0 g/dL   RDW 96.2 (H) 22.9 - 79.8 %   Platelets 282.0 150.0 - 400.0 K/uL   Neutrophils Relative % 44.0 43.0 - 77.0 %   Lymphocytes Relative 41.0 12.0 - 46.0 %   Monocytes Relative 13.2 (H) 3.0 - 12.0 %   Eosinophils Relative 1.4 0.0 - 5.0 %   Basophils Relative 0.4 0.0 - 3.0 %   Neutro Abs 2.5 1.4 - 7.7 K/uL   Lymphs Abs 2.4 0.7 - 4.0 K/uL   Monocytes Absolute 0.8 0.1 - 1.0 K/uL   Eosinophils Absolute 0.1 0.0 - 0.7 K/uL   Basophils Absolute 0.0 0.0 - 0.1 K/uL  TSH     Status: None   Collection Time: 10/28/20  8:38 AM  Result Value Ref Range   TSH 1.45 0.35 - 5.50 uIU/mL  Vitamin D (25 hydroxy)     Status: Abnormal   Collection Time: 10/28/20  8:38 AM  Result Value Ref Range   VITD 24.34 (L) 30.00 - 100.00 ng/mL   Objective  Body mass index is 18.96 kg/m. Wt Readings from Last 3 Encounters:   11/03/20 128 lb 6.4 oz (58.2 kg)  07/01/20 130 lb (59 kg)  04/22/20 133 lb (60.3 kg)   Temp Readings from Last 3 Encounters:  11/03/20 (!) 97 F (36.1 C) (Temporal)  07/01/20 98.3 F (36.8 C)  04/20/20 97.6 F (36.4 C) (Oral)   BP Readings from Last 3 Encounters:  11/03/20 110/70  07/01/20 122/88  04/20/20 120/76  Pulse Readings from Last 3 Encounters:  11/03/20 79  07/01/20 99  04/20/20 73    Physical Exam Vitals and nursing note reviewed.  Constitutional:      Appearance: Normal appearance. He is well-developed and well-groomed.  HENT:     Head: Normocephalic and atraumatic.  Eyes:     Conjunctiva/sclera: Conjunctivae normal.     Pupils: Pupils are equal, round, and reactive to light.  Cardiovascular:     Rate and Rhythm: Normal rate and regular rhythm.     Heart sounds: Normal heart sounds. No murmur heard. Pulmonary:     Effort: Pulmonary effort is normal.     Breath sounds: Normal breath sounds.  Skin:    General: Skin is warm and dry.  Neurological:     General: No focal deficit present.     Mental Status: He is alert and oriented to person, place, and time. Mental status is at baseline.     Gait: Gait normal.  Psychiatric:        Attention and Perception: Attention and perception normal.        Mood and Affect: Mood and affect normal.        Speech: Speech normal.        Behavior: Behavior normal. Behavior is cooperative.        Thought Content: Thought content normal.        Cognition and Memory: Cognition and memory normal.        Judgment: Judgment normal.    Assessment  Plan  Cervicalgia - Plan: Ambulatory referral to Physical Therapy Acute bilateral low back pain with bilateral sciatica - Plan: Ambulatory referral to Physical Therapy Bilateral hip pain - Plan: Ambulatory referral to Physical Therapy Bilateral shoulder pain, unspecified chronicity - Plan: Ambulatory referral to Physical Therapy F/u preferred pain clinic in [place]    Monocytosis Declines hematology today will address again in the future  Grief  Monitor   HM Declines flu shot   Had 3/3 doses hep B check titer in future consider  Will disc Tdap and shingrix in future  moderna 3/3 disc booster  prevnar disc today declines consider this in future with pna 23    PSA psa normal 10/2020  Hep C negative  -h/o hematuria CT 08/2017 with left kidney 6 mm stone could be etiology and CT 04/2019 with b/l nonobstructing kidney stones on CT L spine HIV neg 03/29/15   Colonoscopy 3 and 07/2017 had poor prep diverticulosis -f/u GI 03/05/18 will ask if will order CT liver protocol for liver lesion  -of note will [facility] GI never had CT ab/pelvis sch 11 or 03/2018  -per pts wife cardiology does not want further procedures for now and especially without their approval previously and as of 02/26/19 no $ to f/u GI    Eye glasses best in [place] cataracts b/l eyes glasses help seen in 2020/2021   [facility] Urology Dr. Richardo HanksSninsky   Provider: Dr. French Anaracy McLean-Scocuzza-Internal Medicine

## 2020-11-10 DIAGNOSIS — M19019 Primary osteoarthritis, unspecified shoulder: Secondary | ICD-10-CM | POA: Diagnosis not present

## 2020-11-10 DIAGNOSIS — G894 Chronic pain syndrome: Secondary | ICD-10-CM | POA: Diagnosis not present

## 2020-11-10 DIAGNOSIS — G5620 Lesion of ulnar nerve, unspecified upper limb: Secondary | ICD-10-CM | POA: Diagnosis not present

## 2020-11-10 DIAGNOSIS — M47816 Spondylosis without myelopathy or radiculopathy, lumbar region: Secondary | ICD-10-CM | POA: Diagnosis not present

## 2020-12-08 DIAGNOSIS — G894 Chronic pain syndrome: Secondary | ICD-10-CM | POA: Diagnosis not present

## 2020-12-08 DIAGNOSIS — M4722 Other spondylosis with radiculopathy, cervical region: Secondary | ICD-10-CM | POA: Diagnosis not present

## 2020-12-08 DIAGNOSIS — M47816 Spondylosis without myelopathy or radiculopathy, lumbar region: Secondary | ICD-10-CM | POA: Diagnosis not present

## 2020-12-08 DIAGNOSIS — M792 Neuralgia and neuritis, unspecified: Secondary | ICD-10-CM | POA: Diagnosis not present

## 2020-12-09 DIAGNOSIS — M19019 Primary osteoarthritis, unspecified shoulder: Secondary | ICD-10-CM | POA: Diagnosis not present

## 2020-12-31 DIAGNOSIS — Z95810 Presence of automatic (implantable) cardiac defibrillator: Secondary | ICD-10-CM | POA: Diagnosis not present

## 2021-01-12 DIAGNOSIS — G894 Chronic pain syndrome: Secondary | ICD-10-CM | POA: Diagnosis not present

## 2021-01-12 DIAGNOSIS — M19019 Primary osteoarthritis, unspecified shoulder: Secondary | ICD-10-CM | POA: Diagnosis not present

## 2021-01-12 DIAGNOSIS — M47816 Spondylosis without myelopathy or radiculopathy, lumbar region: Secondary | ICD-10-CM | POA: Diagnosis not present

## 2021-01-12 DIAGNOSIS — G5620 Lesion of ulnar nerve, unspecified upper limb: Secondary | ICD-10-CM | POA: Diagnosis not present

## 2021-01-12 DIAGNOSIS — M4727 Other spondylosis with radiculopathy, lumbosacral region: Secondary | ICD-10-CM | POA: Diagnosis not present

## 2021-01-26 DIAGNOSIS — I723 Aneurysm of iliac artery: Secondary | ICD-10-CM | POA: Diagnosis not present

## 2021-01-26 DIAGNOSIS — I25119 Atherosclerotic heart disease of native coronary artery with unspecified angina pectoris: Secondary | ICD-10-CM | POA: Diagnosis not present

## 2021-01-26 DIAGNOSIS — I739 Peripheral vascular disease, unspecified: Secondary | ICD-10-CM | POA: Diagnosis not present

## 2021-01-26 DIAGNOSIS — Z95828 Presence of other vascular implants and grafts: Secondary | ICD-10-CM | POA: Diagnosis not present

## 2021-02-02 DIAGNOSIS — G5620 Lesion of ulnar nerve, unspecified upper limb: Secondary | ICD-10-CM | POA: Diagnosis not present

## 2021-02-02 DIAGNOSIS — G894 Chronic pain syndrome: Secondary | ICD-10-CM | POA: Diagnosis not present

## 2021-02-02 DIAGNOSIS — M19019 Primary osteoarthritis, unspecified shoulder: Secondary | ICD-10-CM | POA: Diagnosis not present

## 2021-02-02 DIAGNOSIS — M47816 Spondylosis without myelopathy or radiculopathy, lumbar region: Secondary | ICD-10-CM | POA: Diagnosis not present

## 2021-02-22 DIAGNOSIS — E782 Mixed hyperlipidemia: Secondary | ICD-10-CM | POA: Diagnosis not present

## 2021-02-22 DIAGNOSIS — I255 Ischemic cardiomyopathy: Secondary | ICD-10-CM | POA: Diagnosis not present

## 2021-02-22 DIAGNOSIS — I10 Essential (primary) hypertension: Secondary | ICD-10-CM | POA: Diagnosis not present

## 2021-02-22 DIAGNOSIS — I5022 Chronic systolic (congestive) heart failure: Secondary | ICD-10-CM | POA: Diagnosis not present

## 2021-02-22 DIAGNOSIS — I2510 Atherosclerotic heart disease of native coronary artery without angina pectoris: Secondary | ICD-10-CM | POA: Diagnosis not present

## 2021-03-09 DIAGNOSIS — Z79899 Other long term (current) drug therapy: Secondary | ICD-10-CM | POA: Diagnosis not present

## 2021-03-09 DIAGNOSIS — M792 Neuralgia and neuritis, unspecified: Secondary | ICD-10-CM | POA: Diagnosis not present

## 2021-03-09 DIAGNOSIS — M4722 Other spondylosis with radiculopathy, cervical region: Secondary | ICD-10-CM | POA: Diagnosis not present

## 2021-03-09 DIAGNOSIS — G894 Chronic pain syndrome: Secondary | ICD-10-CM | POA: Diagnosis not present

## 2021-03-09 DIAGNOSIS — Z79891 Long term (current) use of opiate analgesic: Secondary | ICD-10-CM | POA: Diagnosis not present

## 2021-03-09 DIAGNOSIS — M47816 Spondylosis without myelopathy or radiculopathy, lumbar region: Secondary | ICD-10-CM | POA: Diagnosis not present

## 2021-04-01 DIAGNOSIS — Z95810 Presence of automatic (implantable) cardiac defibrillator: Secondary | ICD-10-CM | POA: Diagnosis not present

## 2021-04-04 ENCOUNTER — Other Ambulatory Visit: Payer: Self-pay | Admitting: Internal Medicine

## 2021-04-04 DIAGNOSIS — Z95810 Presence of automatic (implantable) cardiac defibrillator: Secondary | ICD-10-CM | POA: Diagnosis not present

## 2021-04-04 DIAGNOSIS — I2510 Atherosclerotic heart disease of native coronary artery without angina pectoris: Secondary | ICD-10-CM

## 2021-04-04 DIAGNOSIS — E531 Pyridoxine deficiency: Secondary | ICD-10-CM

## 2021-04-05 ENCOUNTER — Other Ambulatory Visit: Payer: Self-pay | Admitting: Internal Medicine

## 2021-04-05 DIAGNOSIS — G4700 Insomnia, unspecified: Secondary | ICD-10-CM

## 2021-04-05 MED ORDER — ZOLPIDEM TARTRATE 10 MG PO TABS: 10.0000 mg | ORAL_TABLET | Freq: Every evening | ORAL | 2 refills | Status: DC | PRN

## 2021-04-13 DIAGNOSIS — M19019 Primary osteoarthritis, unspecified shoulder: Secondary | ICD-10-CM | POA: Diagnosis not present

## 2021-04-13 DIAGNOSIS — M4727 Other spondylosis with radiculopathy, lumbosacral region: Secondary | ICD-10-CM | POA: Diagnosis not present

## 2021-04-13 DIAGNOSIS — G894 Chronic pain syndrome: Secondary | ICD-10-CM | POA: Diagnosis not present

## 2021-04-13 DIAGNOSIS — M4722 Other spondylosis with radiculopathy, cervical region: Secondary | ICD-10-CM | POA: Diagnosis not present

## 2021-04-25 ENCOUNTER — Ambulatory Visit: Payer: Medicare Other

## 2021-05-04 ENCOUNTER — Other Ambulatory Visit: Payer: Self-pay | Admitting: Internal Medicine

## 2021-05-04 DIAGNOSIS — M4727 Other spondylosis with radiculopathy, lumbosacral region: Secondary | ICD-10-CM | POA: Diagnosis not present

## 2021-05-04 DIAGNOSIS — G8929 Other chronic pain: Secondary | ICD-10-CM

## 2021-05-24 ENCOUNTER — Ambulatory Visit (INDEPENDENT_AMBULATORY_CARE_PROVIDER_SITE_OTHER): Payer: Medicare Other

## 2021-05-24 ENCOUNTER — Other Ambulatory Visit: Payer: Self-pay

## 2021-05-24 VITALS — Ht 69.0 in | Wt 132.0 lb

## 2021-05-24 DIAGNOSIS — Z0000 Encounter for general adult medical examination without abnormal findings: Secondary | ICD-10-CM | POA: Diagnosis not present

## 2021-05-24 NOTE — Patient Instructions (Addendum)
[name] , Thank you for taking time to come for your Medicare Wellness Visit. I appreciate your ongoing commitment to your health goals. Please review the following plan we discussed and let me know if I can assist you in the future.   These are the goals we discussed:  Goals      Follow up with Primary Care Provider     As needed.        This is a list of the screening recommended for you and due dates:  Health Maintenance  Topic Date Due   Urine Protein Check  06/07/2021*   COVID-19 Vaccine (3 - Moderna risk series) 11/08/2021*   Colon Cancer Screening  07/25/2027   Hepatitis C Screening: USPSTF Recommendation to screen - Ages 18-79 yo.  Completed   HPV Vaccine  Aged Out   Pneumonia Vaccine  Discontinued   Flu Shot  Discontinued   Tetanus Vaccine  Discontinued   Zoster (Shingles) Vaccine  Discontinued  *Topic was postponed. The date shown is not the original due date.    Advanced directives: not yet completed  Conditions/risks identified: none new  Next appointment: Follow up in one year for your annual wellness visit.   Preventive Care 67 Years and Older, Male Preventive care refers to lifestyle choices and visits with your health care provider that can promote health and wellness. What does preventive care include? A yearly physical exam. This is also called an annual well check. Dental exams once or twice a year. Routine eye exams. Ask your health care provider how often you should have your eyes checked. Personal lifestyle choices, including: Daily care of your teeth and gums. Regular physical activity. Eating a healthy diet. Avoiding tobacco and drug use. Limiting alcohol use. Practicing safe sex. Taking low doses of aspirin every day. Taking vitamin and mineral supplements as recommended by your health care provider. What happens during an annual well check? The services and screenings done by your health care provider during your annual well check  will depend on your age, overall health, lifestyle risk factors, and family history of disease. Counseling  Your health care provider may ask you questions about your: Alcohol use. Tobacco use. Drug use. Emotional well-being. Home and relationship well-being. Sexual activity. Eating habits. History of falls. Memory and ability to understand (cognition). Work and work Astronomer. Screening  You may have the following tests or measurements: Height, weight, and BMI. Blood pressure. Lipid and cholesterol levels. These may be checked every 5 years, or more frequently if you are over 55 years old. Skin check. Lung cancer screening. You may have this screening every year starting at age 67 if you have a 30-pack-year history of smoking and currently smoke or have quit within the past 15 years. Fecal occult blood test (FOBT) of the stool. You may have this test every year starting at age 67. Flexible sigmoidoscopy or colonoscopy. You may have a sigmoidoscopy every 5 years or a colonoscopy every 10 years starting at age 67. Prostate cancer screening. Recommendations will vary depending on your family history and other risks. Hepatitis C blood test. Hepatitis B blood test. Sexually transmitted disease (STD) testing. Diabetes screening. This is done by checking your blood sugar (glucose) after you have not eaten for a while (fasting). You may have this done every 1-3 years. Abdominal aortic aneurysm (AAA) screening. You may need this if you are a current or former smoker. Osteoporosis. You may be screened starting at age 67 if you are at  high risk. Talk with your health care provider about your test results, treatment options, and if necessary, the need for more tests. Vaccines  Your health care provider may recommend certain vaccines, such as: Influenza vaccine. This is recommended every year. Tetanus, diphtheria, and acellular pertussis (Tdap, Td) vaccine. You may need a Td booster every 10  years. Zoster vaccine. You may need this after age 75. Pneumococcal 13-valent conjugate (PCV13) vaccine. One dose is recommended after age 67. Pneumococcal polysaccharide (PPSV23) vaccine. One dose is recommended after age 67. Talk to your health care provider about which screenings and vaccines you need and how often you need them. This information is not intended to replace advice given to you by your health care provider. Make sure you discuss any questions you have with your health care provider. Document Released: 04/23/2015 Document Revised: 12/15/2015 Document Reviewed: 01/26/2015 Elsevier Interactive Patient Education  2017 [place] Prevention in the Home Falls can cause injuries. They can happen to people of all ages. There are many things you can do to make your home safe and to help prevent falls. What can I do on the outside of my home? Regularly fix the edges of walkways and driveways and fix any cracks. Remove anything that might make you trip as you walk through a door, such as a raised step or threshold. Trim any bushes or trees on the path to your home. Use bright outdoor lighting. Clear any walking paths of anything that might make someone trip, such as rocks or tools. Regularly check to see if handrails are loose or broken. Make sure that both sides of any steps have handrails. Any raised decks and porches should have guardrails on the edges. Have any leaves, snow, or ice cleared regularly. Use sand or salt on walking paths during winter. Clean up any spills in your garage right away. This includes oil or grease spills. What can I do in the bathroom? Use night lights. Install grab bars by the toilet and in the tub and shower. Do not use towel bars as grab bars. Use non-skid mats or decals in the tub or shower. If you need to sit down in the shower, use a plastic, non-slip stool. Keep the floor dry. Clean up any water that spills on the floor as soon as it  happens. Remove soap buildup in the tub or shower regularly. Attach bath mats securely with double-sided non-slip rug tape. Do not have throw rugs and other things on the floor that can make you trip. What can I do in the bedroom? Use night lights. Make sure that you have a light by your bed that is easy to reach. Do not use any sheets or blankets that are too big for your bed. They should not hang down onto the floor. Have a firm chair that has side arms. You can use this for support while you get dressed. Do not have throw rugs and other things on the floor that can make you trip. What can I do in the kitchen? Clean up any spills right away. Avoid walking on wet floors. Keep items that you use a lot in easy-to-reach places. If you need to reach something above you, use a strong step stool that has a grab bar. Keep electrical cords out of the way. Do not use floor polish or wax that makes floors slippery. If you must use wax, use non-skid floor wax. Do not have throw rugs and other things on the floor that  can make you trip. What can I do with my stairs? Do not leave any items on the stairs. Make sure that there are handrails on both sides of the stairs and use them. Fix handrails that are broken or loose. Make sure that handrails are as long as the stairways. Check any carpeting to make sure that it is firmly attached to the stairs. Fix any carpet that is loose or worn. Avoid having throw rugs at the top or bottom of the stairs. If you do have throw rugs, attach them to the floor with carpet tape. Make sure that you have a light switch at the top of the stairs and the bottom of the stairs. If you do not have them, ask someone to add them for you. What else can I do to help prevent falls? Wear shoes that: Do not have high heels. Have rubber bottoms. Are comfortable and fit you well. Are closed at the toe. Do not wear sandals. If you use a stepladder: Make sure that it is fully opened.  Do not climb a closed stepladder. Make sure that both sides of the stepladder are locked into place. Ask someone to hold it for you, if possible. Clearly mark and make sure that you can see: Any grab bars or handrails. First and last steps. Where the edge of each step is. Use tools that help you move around (mobility aids) if they are needed. These include: Canes. Walkers. Scooters. Crutches. Turn on the lights when you go into a dark area. Replace any light bulbs as soon as they burn out. Set up your furniture so you have a clear path. Avoid moving your furniture around. If any of your floors are uneven, fix them. If there are any pets around you, be aware of where they are. Review your medicines with your doctor. Some medicines can make you feel dizzy. This can increase your chance of falling. Ask your doctor what other things that you can do to help prevent falls. This information is not intended to replace advice given to you by your health care provider. Make sure you discuss any questions you have with your health care provider. Document Released: 01/21/2009 Document Revised: 09/02/2015 Document Reviewed: 05/01/2014 Elsevier Interactive Patient Education  2017 [employer].

## 2021-05-24 NOTE — Progress Notes (Signed)
Subjective:   Martin Hernandez. is a 66 y.o. male who presents for Medicare Annual/Subsequent preventive examination.  Review of Systems    No ROS.  Medicare Wellness Virtual Visit.  Visual/audio telehealth visit, UTA vital signs.   See social history for additional risk factors.   Cardiac Risk Factors include: advanced age (>39men, >73 women);male gender;hypertension     Objective:    Today's Vitals   05/24/21 0947  Weight: 132 lb (59.9 kg)  Height: 5\' 9"  (1.753 m)   Body mass index is 19.49 kg/m.  Advanced Directives 05/24/2021 04/22/2020 04/22/2019 04/18/2018 04/18/2018 11/05/2017 07/24/2017  Does Patient Have a Medical Advance Directive? No Yes No No No No Yes  Type of Advance Directive - [place];Living will  Does patient want to make changes to medical advance directive? - No - Patient declined - - - - -  Copy of [place] in Chart? - No - copy requested - - - - No - copy requested  Would patient like information on creating a medical advance directive? No - Patient declined - Yes (MAU/Ambulatory/Procedural Areas - Information given) No - Patient declined No - Patient declined Yes (MAU/Ambulatory/Procedural Areas - Information given) -    Current Medications (verified) Outpatient Encounter Medications as of 05/24/2021  Medication Sig   albuterol (VENTOLIN HFA) 108 (90 Base) MCG/ACT inhaler Inhale 1-2 puffs into the lungs every 6 (six) hours as needed for wheezing or shortness of breath. RUSH order please (Patient not taking: Reported on 11/03/2020)   amLODipine (NORVASC) 2.5 MG tablet Take 1 tablet (2.5 mg total) by mouth daily. (Patient not taking: Reported on 11/03/2020)   aspirin 81 MG tablet Take 81 mg by mouth daily.   atorvastatin (LIPITOR) 80 MG tablet Take 1 tablet (80 mg total) by mouth daily at 6pm.   Baclofen 5 MG TABS Take 1 tablet by mouth twice daily.   Cholecalciferol 1.25 MG  (50000 UT) capsule Take 1 capsule (50,000 Units total) by mouth every 30 (thirty) days. (Patient not taking: Reported on 11/03/2020)   clopidogrel (PLAVIX) 75 MG tablet 1 pill daily   finasteride (PROSCAR) 5 MG tablet TAKE 1 TABLET BY MOUTH EVERY DAY (Patient not taking: Reported on 11/03/2020)   furosemide (LASIX) 20 MG tablet Take 20 mg by mouth daily.   ipratropium (ATROVENT) 0.02 % nebulizer solution Inhale into the lungs. (Patient not taking: Reported on 11/03/2020)   isosorbide mononitrate (IMDUR) 60 MG 24 hr tablet Take 1 tablet by mouth in the morning, at noon, and at bedtime. (Patient not taking: Reported on 11/03/2020)   levocetirizine (XYZAL) 5 MG tablet Take 1 tablet by mouth every evening.   metoprolol succinate (TOPROL-XL) 25 MG 24 hr tablet Take 1 tablet (25 mg total) by mouth daily.   mirtazapine (REMERON) 30 MG tablet 1 pill nightly   nitroGLYCERIN (NITROSTAT) 0.4 MG SL tablet Place 1 tablet (0.4 mg total) under the tongue every 5 (five) minutes as needed for chest pain.   oxyCODONE-acetaminophen (PERCOCET) 10-325 MG tablet    pantoprazole (PROTONIX) 40 MG tablet Take 1 tablet (40 mg total) by mouth daily. 30 minutes before food   pyridOXINE (VITAMIN B-6) 100 MG tablet Take 1 tablet by mouth daily.   sodium chloride (OCEAN) 0.65 % SOLN nasal spray Place 2 sprays into both nostrils as needed for congestion. (Patient not taking: Reported on 11/03/2020)   tamsulosin (FLOMAX) 0.4 MG CAPS capsule Take  2 capsules by mouth daily after supper.   thiamine (VITAMIN B-1) 100 MG tablet Take 100 mg by mouth daily.   zolpidem (AMBIEN) 10 MG tablet Take 1 tablet (10 mg total) by mouth at bedtime as needed. for sleep   No facility-administered encounter medications on file as of 05/24/2021.    Allergies (verified) Patient has no known allergies.   History: Past Medical History:  Diagnosis Date   Allergy    CAD (coronary artery disease)    Depression    Frequent headaches    GERD  (gastroesophageal reflux disease)    Headache    Hyperlipidemia    Hypertension    PAD (peripheral artery disease) (HCC)    Tobacco abuse    Past Surgical History:  Procedure Laterality Date   COLONOSCOPY WITH PROPOFOL N/A 06/20/2017   Procedure: COLONOSCOPY WITH PROPOFOL;  Surgeon: Jonathon Bellows, MD;  Location[facility] ENDOSCOPY;  Service: Gastroenterology;  Laterality: N/A;   COLONOSCOPY WITH PROPOFOL N/A 07/24/2017   Procedure: COLONOSCOPY WITH PROPOFOL;  Surgeon: Jonathon Bellows, MD;  Location: [facility] ENDOSCOPY;  Service: Gastroenterology;  Laterality: N/A;   ESOPHAGOGASTRODUODENOSCOPY (EGD) WITH PROPOFOL N/A 06/20/2017   Procedure: ESOPHAGOGASTRODUODENOSCOPY (EGD) WITH PROPOFOL;  Surgeon: Jonathon Bellows, MD;  Location: [facility] ENDOSCOPY;  Service: Gastroenterology;  Laterality: N/A;   PERCUTANEOUS CORONARY STENT INTERVENTION (PCI-S)     STOMACH SURGERY     2009 [facility] per chart review pt had AAA repair    TONSILLECTOMY     Family History  Problem Relation Age of Onset   Diabetes Mother    Heart disease Mother    Hypertension Mother    Stroke Mother    Prostate cancer Father    Cancer Father        prostate cancer in 63s   Diabetes Sister    Heart disease Sister    Ulcers Other        unknown who had ulcers in family    Social History   Socioeconomic History   Marital status: Married    Spouse name: Not on file   Number of children: Not on file   Years of education: Not on file   Highest education level: Not on file  Occupational History   Not on file  Tobacco Use   Smoking status: Every Day    Packs/day: 0.25    Types: Cigarettes   Smokeless tobacco: Never  Vaping Use   Vaping Use: Never used  Substance and Sexual Activity   Alcohol use: No    Alcohol/week: 0.0 standard drinks    Comment: quit 1 year ago   Drug use: No   Sexual activity: Not Currently  Other Topics Concern   Not on file  Social History Narrative   Disabled    4 kids 2 living    Used to do mill work     Smoker since age 54 max 2 pk per week now 1 ppd as of 02/2017    Married    Social Determinants of Radio broadcast assistant Strain: Low Risk    Difficulty of Paying Living Expenses: Not hard at all  Food Insecurity: No Food Insecurity   Worried About Charity fundraiser in the Last Year: Never true   Arboriculturist in the Last Year: Never true  Transportation Needs: No Transportation Needs   Lack of Transportation (Medical): No   Lack of Transportation (Non-Medical): No  Physical Activity: Not on file  Stress: No Stress Concern Present  Feeling of Stress : Not at all  Social Connections: Unknown   Frequency of Communication with Friends and Family: Not on file   Frequency of Social Gatherings with Friends and Family: Not on file   Attends Religious Services: Not on Electrical engineer or Organizations: Not on file   Attends Archivist Meetings: Not on file   Marital Status: Married    Tobacco Counseling Ready to quit: Not Answered Counseling given: Not Answered   Clinical Intake:  Pre-visit preparation completed: Yes        Diabetes: No  How often do you need to have someone help you when you read instructions, pamphlets, or other written materials from your doctor or pharmacy?: 1 - Never    Interpreter Needed?: No      Activities of Daily Living In your present state of health, do you have any difficulty performing the following activities: 05/24/2021  Hearing? N  Vision? N  Difficulty concentrating or making decisions? N  Walking or climbing stairs? N  Dressing or bathing? N  Doing errands, shopping? N  Preparing Food and eating ? N  Using the Toilet? N  In the past six months, have you accidently leaked urine? N  Do you have problems with loss of bowel control? N  Managing your Medications? N  Managing your Finances? N  Housekeeping or managing your Housekeeping? N  Some recent data might be hidden   Patient Care  Team: McLean-Scocuzza, Nino Glow, MD as PCP - General (Internal Medicine)  Indicate any recent Medical Services you may have received from other than [facility] providers in the past year (date may be approximate).     Assessment:   This is a routine wellness examination for Reyli.  Virtual Visit via Telephone Note  I connected with  Butler Denmark. on 05/24/21 at  9:45 AM EST by telephone and verified that I am speaking with the correct person using two identifiers.  Persons participating in the virtual visit: patient/Nurse Health Advisor   I discussed the limitations, risks, security and privacy concerns of performing an evaluation and management service by telephone and the availability of in person appointments. The patient expressed understanding and agreed to proceed.  Interactive audio and video telecommunications were attempted between this nurse and patient, however failed, due to patient having technical difficulties OR patient did not have access to video capability.  We continued and completed visit with audio only.  Some vital signs may be absent or patient reported.   Hearing/Vision screen Hearing Screening - Comments:: Patient is able to hear conversational tones without difficulty. No issues reported.  Vision Screening - Comments:: Without correction 20/40 20/63 20/32  Comments: Wears reading glasses    Dietary issues and exercise activities discussed: Current Exercise Habits: Home exercise routine, Type of exercise: walking, Intensity: Mild Healthy diet Good water intake   Goals Addressed             This Visit's Progress    Follow up with Primary Care Provider       As needed.       Depression Screen PHQ 2/9 Scores 05/24/2021 04/22/2020 10/17/2019 06/27/2019 04/22/2019 02/26/2019 11/05/2017  PHQ - 2 Score 0 0 0 0 0 0 0  PHQ- 9 Score - - 0 - - - -    Fall Risk Fall Risk  05/24/2021 11/03/2020 04/22/2020 04/20/2020 10/17/2019  Falls in the past year? 0 1 0  0 0  Number falls in  past yr: 0 1 - 0 0  Injury with Fall? - 0 - 0 0  Risk Factor Category  - - - - -  Risk for fall due to : - History of fall(s) - - -  Follow up Falls evaluation completed Falls evaluation completed - Falls evaluation completed Falls evaluation completed   [place]: Home free of loose throw rugs in walkways, pet beds, electrical cords, etc? Yes  Adequate lighting in your home to reduce risk of falls? Yes   ASSISTIVE DEVICES UTILIZED TO PREVENT FALLS: Life alert? No  Use of a cane, walker or w/c? No  Grab bars in the bathroom? No  Shower chair or bench in shower? No  Elevated toilet seat or a handicapped toilet? No   TIMED UP AND GO: Was the test performed? No .   Cognitive Function: Patient is alert and oriented x3.  MMSE - Mini Mental State Exam 11/03/2015  Orientation to time 5  Orientation to Place 5  Registration 3  Attention/ Calculation 5  Recall 3  Language- name 2 objects 2  Language- repeat 1  Language- follow 3 step command 3  Language- read & follow direction 1  Write a sentence 1  Copy design 1  Total score 30     6CIT Screen 05/24/2021 04/22/2020 04/22/2019 11/05/2017 11/02/2016  What Year? 0 points 0 points 0 points 0 points 0 points  What month? 0 points 0 points 0 points 0 points 0 points  What time? 0 points 0 points 0 points 0 points 0 points  Count back from 20 0 points - 0 points 0 points 0 points  Months in reverse (No Data) 0 points 4 points 2 points 4 points  Repeat phrase - 0 points - 0 points 0 points  Total Score - - - 2 4    Immunizations Immunization History  Administered Date(s) Administered   Hepatitis B, adult 03/08/2017, 05/22/2017, 05/30/2018   Influenza-Unspecified 04/19/2012   Moderna SARS-COV2 Booster Vaccination 03/22/2020   Moderna Sars-Covid-2 Vaccination 06/18/2019, 07/19/2019   Urine microalbumin- followed by PCP. Deferred for next lab.   Flu Vaccine status: Declined,  Education has been provided regarding the importance of this vaccine but patient still declined. Advised may receive this vaccine at local pharmacy or Health Dept. Aware to provide a copy of the vaccination record if obtained from local pharmacy or Health Dept. Verbalized acceptance and understanding. Discontinued per patient.   Pneumococcal vaccine status: Declined,  Education has been provided regarding the importance of this vaccine but patient still declined. Advised may receive this vaccine at local pharmacy or Health Dept. Aware to provide a copy of the vaccination record if obtained from local pharmacy or Health Dept. Verbalized acceptance and understanding.  Discontinued per patient.   Shingrix Completed?: No.    Education has been provided regarding the importance of this vaccine. Patient has been advised to call insurance company to determine out of pocket expense if they have not yet received this vaccine. Advised may also receive vaccine at local pharmacy or Health Dept. Verbalized acceptance and understanding.Discontinued per patient.   Screening Tests Health Maintenance  Topic Date Due   URINE MICROALBUMIN  06/07/2021 (Originally 01/02/1966)   COVID-19 Vaccine (3 - Moderna risk series) 11/08/2021 (Originally 04/19/2020)   COLONOSCOPY (Pts 45-74yrs Insurance coverage will need to be confirmed)  07/25/2027   Hepatitis C Screening  Completed   HPV VACCINES  Aged Out   Pneumonia Vaccine 65+ Years  old  Discontinued   INFLUENZA VACCINE  Discontinued   TETANUS/TDAP  Discontinued   Zoster Vaccines- Shingrix  Discontinued   Health Maintenance There are no preventive care reminders to display for this patient.  Vision Screening: Recommended annual ophthalmology exams for early detection of glaucoma and other disorders of the eye.  Dental Screening: Recommended annual dental exams for proper oral hygiene  Community Resource Referral / Chronic Care Management: CRR required this visit?  No    CCM required this visit?  No      Plan:   Keep all routine maintenance appointments.   I have personally reviewed and noted the following in the patients chart:   Medical and social history Use of alcohol, tobacco or illicit drugs  Current medications and supplements including opioid prescriptions. Patient is currently taking opioid prescriptions. Information provided to patient regarding non-opioid alternatives. Patient advised to discuss non-opioid treatment plan with their provider. Followed by [facility]. Functional ability and status Nutritional status Physical activity Advanced directives List of other physicians Hospitalizations, surgeries, and ER visits in previous 12 months Vitals Screenings to include cognitive, depression, and falls Referrals and appointments  In addition, I have reviewed and discussed with patient certain preventive protocols, quality metrics, and best practice recommendations. A written personalized care plan for preventive services as well as general preventive health recommendations were provided to patient via mychart.     Varney Biles, LPN   [phone]

## 2021-05-26 DIAGNOSIS — M19019 Primary osteoarthritis, unspecified shoulder: Secondary | ICD-10-CM | POA: Diagnosis not present

## 2021-05-30 DIAGNOSIS — M5412 Radiculopathy, cervical region: Secondary | ICD-10-CM | POA: Diagnosis not present

## 2021-06-07 ENCOUNTER — Ambulatory Visit (INDEPENDENT_AMBULATORY_CARE_PROVIDER_SITE_OTHER): Payer: Medicare Other | Admitting: Internal Medicine

## 2021-06-07 ENCOUNTER — Other Ambulatory Visit: Payer: Self-pay

## 2021-06-07 ENCOUNTER — Telehealth: Payer: Self-pay | Admitting: Internal Medicine

## 2021-06-07 ENCOUNTER — Encounter: Payer: Self-pay | Admitting: Internal Medicine

## 2021-06-07 VITALS — BP 116/80 | HR 80 | Temp 98.1°F | Ht 69.0 in | Wt 123.4 lb

## 2021-06-07 DIAGNOSIS — E531 Pyridoxine deficiency: Secondary | ICD-10-CM

## 2021-06-07 DIAGNOSIS — I2510 Atherosclerotic heart disease of native coronary artery without angina pectoris: Secondary | ICD-10-CM | POA: Diagnosis not present

## 2021-06-07 DIAGNOSIS — M48061 Spinal stenosis, lumbar region without neurogenic claudication: Secondary | ICD-10-CM

## 2021-06-07 DIAGNOSIS — M19049 Primary osteoarthritis, unspecified hand: Secondary | ICD-10-CM

## 2021-06-07 DIAGNOSIS — M1990 Unspecified osteoarthritis, unspecified site: Secondary | ICD-10-CM | POA: Diagnosis not present

## 2021-06-07 DIAGNOSIS — M542 Cervicalgia: Secondary | ICD-10-CM | POA: Insufficient documentation

## 2021-06-07 DIAGNOSIS — I10 Essential (primary) hypertension: Secondary | ICD-10-CM | POA: Diagnosis not present

## 2021-06-07 DIAGNOSIS — H269 Unspecified cataract: Secondary | ICD-10-CM | POA: Diagnosis not present

## 2021-06-07 DIAGNOSIS — E559 Vitamin D deficiency, unspecified: Secondary | ICD-10-CM

## 2021-06-07 DIAGNOSIS — M47816 Spondylosis without myelopathy or radiculopathy, lumbar region: Secondary | ICD-10-CM | POA: Diagnosis not present

## 2021-06-07 DIAGNOSIS — G4700 Insomnia, unspecified: Secondary | ICD-10-CM

## 2021-06-07 DIAGNOSIS — H547 Unspecified visual loss: Secondary | ICD-10-CM | POA: Diagnosis not present

## 2021-06-07 HISTORY — DX: Cervicalgia: M54.2

## 2021-06-07 HISTORY — DX: Spondylosis without myelopathy or radiculopathy, lumbar region: M47.816

## 2021-06-07 LAB — CBC WITH DIFFERENTIAL/PLATELET
Basophils Absolute: 0 10*3/uL (ref 0.0–0.1)
Basophils Relative: 0.6 % (ref 0.0–3.0)
Eosinophils Absolute: 0.1 10*3/uL (ref 0.0–0.7)
Eosinophils Relative: 1 % (ref 0.0–5.0)
HCT: 41.6 % (ref 39.0–52.0)
Hemoglobin: 13.6 g/dL (ref 13.0–17.0)
Lymphocytes Relative: 30.5 % (ref 12.0–46.0)
Lymphs Abs: 1.9 10*3/uL (ref 0.7–4.0)
MCHC: 32.7 g/dL (ref 30.0–36.0)
MCV: 91.1 fl (ref 78.0–100.0)
Monocytes Absolute: 0.6 10*3/uL (ref 0.1–1.0)
Monocytes Relative: 9.7 % (ref 3.0–12.0)
Neutro Abs: 3.6 10*3/uL (ref 1.4–7.7)
Neutrophils Relative %: 58.2 % (ref 43.0–77.0)
Platelets: 254 10*3/uL (ref 150.0–400.0)
RBC: 4.57 Mil/uL (ref 4.22–5.81)
RDW: 17.2 % — ABNORMAL HIGH (ref 11.5–15.5)
WBC: 6.2 10*3/uL (ref 4.0–10.5)

## 2021-06-07 LAB — COMPREHENSIVE METABOLIC PANEL
ALT: 15 U/L (ref 0–53)
AST: 19 U/L (ref 0–37)
Albumin: 4.5 g/dL (ref 3.5–5.2)
Alkaline Phosphatase: 49 U/L (ref 39–117)
BUN: 23 mg/dL (ref 6–23)
CO2: 29 mEq/L (ref 19–32)
Calcium: 9.3 mg/dL (ref 8.4–10.5)
Chloride: 101 mEq/L (ref 96–112)
Creatinine, Ser: 1.24 mg/dL (ref 0.40–1.50)
GFR: 60.62 mL/min (ref >60.00)
Glucose, Bld: 86 mg/dL (ref 70–99)
Potassium: 4.5 mEq/L (ref 3.5–5.1)
Sodium: 139 mEq/L (ref 135–145)
Total Bilirubin: 0.3 mg/dL (ref 0.2–1.2)
Total Protein: 7.1 g/dL (ref 6.0–8.3)

## 2021-06-07 LAB — LIPID PANEL
Cholesterol: 101 mg/dL (ref 0–200)
HDL: 41.3 mg/dL (ref >39.00)
LDL Cholesterol: 45 mg/dL (ref 0–99)
NonHDL: 59.57
Total CHOL/HDL Ratio: 2
Triglycerides: 75 mg/dL (ref 0.0–149.0)
VLDL: 15 mg/dL (ref 0.0–40.0)

## 2021-06-07 LAB — VITAMIN D 25 HYDROXY (VIT D DEFICIENCY, FRACTURES): VITD: 32.94 ng/mL (ref 30.00–100.00)

## 2021-06-07 MED ORDER — ATORVASTATIN CALCIUM 80 MG PO TABS: ORAL_TABLET | ORAL | 3 refills | Status: AC

## 2021-06-07 MED ORDER — VITAMIN B-6 100 MG PO TABS: 100.0000 mg | ORAL_TABLET | Freq: Every day | ORAL | 3 refills | Status: DC

## 2021-06-07 MED ORDER — ZOLPIDEM TARTRATE 10 MG PO TABS: 10.0000 mg | ORAL_TABLET | Freq: Every evening | ORAL | 2 refills | Status: DC | PRN

## 2021-06-07 NOTE — Progress Notes (Signed)
Chief Complaint  Patient presents with   Follow-up   F/u  1. Chronic arthritis pain low back f/u pain clinic doing injections low back but insurance will not cover injections in the neck he is thinking about PT as well 2. Htn controlled only on imdur and bb and f/u [facility]  3. Reduced vision and cant see needs to see eye doctor an optometry clinic told him he may have cataracts and will need surgery will refer  4. C/o ringing in left ear disc consider ent referral if continues informed today  Review of Systems  Constitutional:  Negative for weight loss.  HENT:  Positive for tinnitus. Negative for hearing loss.   Eyes:  Negative for blurred vision.       +reduced vision  Respiratory:  Negative for shortness of breath.   Cardiovascular:  Negative for chest pain.  Gastrointestinal:  Negative for abdominal pain and blood in stool.  Musculoskeletal:  Negative for back pain.  Skin:  Negative for rash.  Neurological:  Negative for headaches.  Psychiatric/Behavioral:  Negative for depression.   Past Medical History:  Diagnosis Date   Allergy    CAD (coronary artery disease)    Depression    Frequent headaches    GERD (gastroesophageal reflux disease)    Headache    Hyperlipidemia    Hypertension    PAD (peripheral artery disease) (HCC)    Tobacco abuse    Past Surgical History:  Procedure Laterality Date   COLONOSCOPY WITH PROPOFOL N/A 06/20/2017   Procedure: COLONOSCOPY WITH PROPOFOL;  Surgeon: Wyline Mood, MD;  Location: [facility] ENDOSCOPY;  Service: Gastroenterology;  Laterality: N/A;   COLONOSCOPY WITH PROPOFOL N/A 07/24/2017   Procedure: COLONOSCOPY WITH PROPOFOL;  Surgeon: Wyline Mood, MD;  Location: [facility] ENDOSCOPY;  Service: Gastroenterology;  Laterality: N/A;   ESOPHAGOGASTRODUODENOSCOPY (EGD) WITH PROPOFOL N/A 06/20/2017   Procedure: ESOPHAGOGASTRODUODENOSCOPY (EGD) WITH PROPOFOL;  Surgeon: Wyline Mood, MD;  Location: [facility] ENDOSCOPY;  Service: Gastroenterology;   Laterality: N/A;   PERCUTANEOUS CORONARY STENT INTERVENTION (PCI-S)     STOMACH SURGERY     2009 [facility] per chart review pt had AAA repair    TONSILLECTOMY     Family History  Problem Relation Age of Onset   Diabetes Mother    Heart disease Mother    Hypertension Mother    Stroke Mother    Prostate cancer Father    Cancer Father        prostate cancer in 7s   Diabetes Sister    Heart disease Sister    Ulcers Other        unknown who had ulcers in family    Social History   Socioeconomic History   Marital status: Married    Spouse name: Not on file   Number of children: Not on file   Years of education: Not on file   Highest education level: Not on file  Occupational History   Not on file  Tobacco Use   Smoking status: Every Day    Packs/day: 0.25    Types: Cigarettes   Smokeless tobacco: Never  Vaping Use   Vaping Use: Never used  Substance and Sexual Activity   Alcohol use: No    Alcohol/week: 0.0 standard drinks    Comment: quit 1 year ago   Drug use: No   Sexual activity: Not Currently  Other Topics Concern   Not on file  Social History Narrative   Disabled    4 kids 2 living  Used to do mill work    Smoker since age 82 max 2 pk per week now 1 ppd as of 02/2017    Married    Social Determinants of Radio broadcast assistant Strain: Low Risk    Difficulty of Paying Living Expenses: Not hard at all  Food Insecurity: No Food Insecurity   Worried About Charity fundraiser in the Last Year: Never true   Arboriculturist in the Last Year: Never true  Transportation Needs: No Transportation Needs   Lack of Transportation (Medical): No   Lack of Transportation (Non-Medical): No  Physical Activity: Not on file  Stress: No Stress Concern Present   Feeling of Stress : Not at all  Social Connections: Unknown   Frequency of Communication with Friends and Family: Not on file   Frequency of Social Gatherings with Friends and Family: Not on file   Attends  Religious Services: Not on file   Active Member of Clubs or Organizations: Not on file   Attends Archivist Meetings: Not on file   Marital Status: Married  Human resources officer Violence: Not At Risk   Fear of Current or Ex-Partner: No   Emotionally Abused: No   Physically Abused: No   Sexually Abused: No   Current Meds  Medication Sig   aspirin 81 MG tablet Take 81 mg by mouth daily.   Baclofen 5 MG TABS Take 1 tablet by mouth twice daily.   clopidogrel (PLAVIX) 75 MG tablet 1 pill daily   furosemide (LASIX) 20 MG tablet Take 20 mg by mouth daily.   ipratropium (ATROVENT) 0.02 % nebulizer solution Inhale into the lungs.   isosorbide mononitrate (IMDUR) 60 MG 24 hr tablet Take 1 tablet by mouth in the morning, at noon, and at bedtime.   levocetirizine (XYZAL) 5 MG tablet Take 1 tablet by mouth every evening.   metoprolol succinate (TOPROL-XL) 25 MG 24 hr tablet Take 1 tablet (25 mg total) by mouth daily.   mirtazapine (REMERON) 30 MG tablet 1 pill nightly   nitroGLYCERIN (NITROSTAT) 0.4 MG SL tablet Place 1 tablet (0.4 mg total) under the tongue every 5 (five) minutes as needed for chest pain.   oxyCODONE-acetaminophen (PERCOCET) 10-325 MG tablet    pantoprazole (PROTONIX) 40 MG tablet Take 1 tablet (40 mg total) by mouth daily. 30 minutes before food   tamsulosin (FLOMAX) 0.4 MG CAPS capsule Take 2 capsules by mouth daily after supper.   thiamine (VITAMIN B-1) 100 MG tablet Take 100 mg by mouth daily.   [DISCONTINUED] atorvastatin (LIPITOR) 80 MG tablet Take 1 tablet (80 mg total) by mouth daily at 6pm.   [DISCONTINUED] pyridOXINE (VITAMIN B-6) 100 MG tablet Take 1 tablet by mouth daily.   [DISCONTINUED] zolpidem (AMBIEN) 10 MG tablet Take 1 tablet (10 mg total) by mouth at bedtime as needed. for sleep   No Known Allergies Recent Results (from the past 2160 hour(s))  Comprehensive metabolic panel     Status: None   Collection Time: 06/07/21 11:40 AM  Result Value Ref Range    Sodium 139 135 - 145 mEq/L   Potassium 4.5 3.5 - 5.1 mEq/L   Chloride 101 96 - 112 mEq/L   CO2 29 19 - 32 mEq/L   Glucose, Bld 86 70 - 99 mg/dL   BUN 23 6 - 23 mg/dL   Creatinine, Ser 1.24 0.40 - 1.50 mg/dL   Total Bilirubin 0.3 0.2 - 1.2 mg/dL   Alkaline Phosphatase 49  39 - 117 U/L   AST 19 0 - 37 U/L   ALT 15 0 - 53 U/L   Total Protein 7.1 6.0 - 8.3 g/dL   Albumin 4.5 3.5 - 5.2 g/dL   GFR 60.62 >60.00 mL/min    Comment: Calculated using the CKD-EPI Creatinine Equation (2021)   Calcium 9.3 8.4 - 10.5 mg/dL  Lipid panel     Status: None   Collection Time: 06/07/21 11:40 AM  Result Value Ref Range   Cholesterol 101 0 - 200 mg/dL    Comment: ATP III Classification       Desirable:  < 200 mg/dL               Borderline High:  200 - 239 mg/dL          High:  > = 240 mg/dL   Triglycerides 75.0 0.0 - 149.0 mg/dL    Comment: Normal:  <150 mg/dLBorderline High:  150 - 199 mg/dL   HDL 41.30 >39.00 mg/dL   VLDL 15.0 0.0 - 40.0 mg/dL   LDL Cholesterol 45 0 - 99 mg/dL   Total CHOL/HDL Ratio 2     Comment:                Men          Women1/2 Average Risk     3.4          3.3Average Risk          5.0          4.42X Average Risk          9.6          7.13X Average Risk          15.0          11.0                       NonHDL 59.57     Comment: NOTE:  Non-HDL goal should be 30 mg/dL higher than patient's LDL goal (i.e. LDL goal of < 70 mg/dL, would have non-HDL goal of < 100 mg/dL)  CBC with Differential/Platelet     Status: Abnormal   Collection Time: 06/07/21 11:40 AM  Result Value Ref Range   WBC 6.2 4.0 - 10.5 K/uL   RBC 4.57 4.22 - 5.81 Mil/uL   Hemoglobin 13.6 13.0 - 17.0 g/dL   HCT 41.6 39.0 - 52.0 %   MCV 91.1 78.0 - 100.0 fl   MCHC 32.7 30.0 - 36.0 g/dL   RDW 17.2 (H) 11.5 - 15.5 %   Platelets 254.0 150.0 - 400.0 K/uL   Neutrophils Relative % 58.2 43.0 - 77.0 %   Lymphocytes Relative 30.5 12.0 - 46.0 %   Monocytes Relative 9.7 3.0 - 12.0 %   Eosinophils Relative 1.0 0.0 - 5.0 %    Basophils Relative 0.6 0.0 - 3.0 %   Neutro Abs 3.6 1.4 - 7.7 K/uL   Lymphs Abs 1.9 0.7 - 4.0 K/uL   Monocytes Absolute 0.6 0.1 - 1.0 K/uL   Eosinophils Absolute 0.1 0.0 - 0.7 K/uL   Basophils Absolute 0.0 0.0 - 0.1 K/uL  Vitamin D (25 hydroxy)     Status: None   Collection Time: 06/07/21 11:40 AM  Result Value Ref Range   VITD 32.94 30.00 - 100.00 ng/mL   Objective  Body mass index is 18.22 kg/m. Wt Readings from Last 3 Encounters:  06/07/21 123 lb 6.4 oz (56 kg)  05/24/21 132 lb (59.9 kg)  11/03/20 128 lb 6.4 oz (58.2 kg)   Temp Readings from Last 3 Encounters:  06/07/21 98.1 F (36.7 C) (Oral)  11/03/20 (!) 97 F (36.1 C) (Temporal)  07/01/20 98.3 F (36.8 C)   BP Readings from Last 3 Encounters:  06/07/21 116/80  11/03/20 110/70  07/01/20 122/88   Pulse Readings from Last 3 Encounters:  06/07/21 80  11/03/20 79  07/01/20 99    Physical Exam Vitals and nursing note reviewed.  Constitutional:      Appearance: Normal appearance. He is well-developed and well-groomed.  HENT:     Head: Normocephalic and atraumatic.  Eyes:     Conjunctiva/sclera: Conjunctivae normal.     Pupils: Pupils are equal, round, and reactive to light.  Cardiovascular:     Rate and Rhythm: Normal rate and regular rhythm.     Heart sounds: Normal heart sounds.  Pulmonary:     Effort: Pulmonary effort is normal. No respiratory distress.     Breath sounds: Normal breath sounds.  Abdominal:     Tenderness: There is no abdominal tenderness.  Skin:    General: Skin is warm and moist.  Neurological:     General: No focal deficit present.     Mental Status: He is alert and oriented to person, place, and time. Mental status is at baseline.     Sensory: Sensation is intact.     Motor: Motor function is intact.     Coordination: Coordination is intact.     Gait: Gait is intact. Gait normal.  Psychiatric:        Attention and Perception: Attention and perception normal.        Mood and  Affect: Mood and affect normal.        Speech: Speech normal.        Behavior: Behavior normal. Behavior is cooperative.        Thought Content: Thought content normal.        Cognition and Memory: Cognition and memory normal.        Judgment: Judgment normal.    Assessment  Plan  Arthritis Arthritis, lumbar spine Lumbar foraminal stenosis Hand arthritis Cervicalgia -back injections not working  F/u pain clinic  Think about pt and given list of PT locally insurance will not cover neck injections until he does PT  Lidocaine or salonpas pain patches otc  Cataract of both eyes, unspecified cataract type - Plan: Ambulatory referral to Ophthalmology Reduced vision - Plan: Ambulatory referral to Ophthalmology  Hypertension, controlled/CAD- Plan: Comprehensive metabolic panel, Lipid panel, CBC with Differential/Platelet Off norvasc 2.5 mg qd  On bb toprol 25 xl  and lasix 20 mg qd Imdur 60 tid F[facility]   Vitamin D deficiency - Plan: Vitamin D (25 hydroxy)  Vitamin B6 deficiency - Plan: pyridOXINE (VITAMIN B-6) 100 MG tablet  Insomnia, unspecified type - Plan: zolpidem (AMBIEN) 10 MG tablet   HM Declines flu shot   Had 3/3 doses hep B check titer in future consider  Will disc Tdap and shingrix in future  moderna 3/3 disc booster  Prevnar, pna 23 disc today declines    PSA psa normal 10/2020  Hep C negative  -h/o hematuria CT 08/2017 with left kidney 6 mm stone could be etiology and CT 04/2019 with b/l nonobstructing kidney stones on CT L spine HIV neg 03/29/15   Colonoscopy 3 and 07/2017 had poor prep diverticulosis -f/u GI 03/05/18 will ask if will order CT liver  protocol for liver lesion  -of note will [facility] never had CT ab/pelvis sch 11 or 03/2018  -per pts wife [facility] does not want further procedures for now and especially without their approval previously and as of 02/26/19 no $ to f/u GI    Eye glasses best in [place] cataracts b/l eyes glasses help seen in  2020/2021 Referred [facility] c/w cataracts and reduced vision  specialists [facility]   Provider: Dr. Olivia Mackie McLean-Scocuzza-Internal Medicine

## 2021-06-07 NOTE — Patient Instructions (Addendum)
Lidocaine pain patch over the counter or salonpas pain patch   Think about physical therapy  [facility] outpatient [address] [facility] [address]  [place] [address]  If wanted referral let me know to above [employer] Fax E-mail Address  [phone] Not available [address]    [place] [address]     Specialties     Ophthalmology      Cataract A cataract is a buildup of protein that causes the lens of the eye to become cloudy. The lens is normally clear. It is the part of the eye that is behind the iris and pupil. The lens focuses light on the retina, which lets you see clearly. When a lens becomes cloudy, your vision may become blurry. The clouding can range from a tiny dot of blurriness to complete cloudiness. As some cataracts develop, they can make it harder for you to see things that are far away. (You become more nearsighted.) Other cataracts increase glare or cause more problems with reading. Cataracts usually get worse over time, and sometimes the pupil can look white. As cataracts get worse, they cloud more of the lens, making it difficult to see. Cataracts can affect one eye or both eyes. What are the causes? This condition is usually caused by age-related eye changes. The lens of the eye is mostly made up of water and protein. Normally, this protein is arranged in a way that keeps the lens clear. Cataracts develop when protein begins to clump together over time. This buildup of protein clouds the lens and lets less light pass through to the retina, which causes blurry vision. What increases the risk? You are more likely to develop this condition if you: Are 90 years of age or older. Have diabetes. Take certain medicines for long periods of time, such as steroids or hormone replacement therapy. Have had a previous eye surgery. Have a family history of cataracts. Other conditions associated with developing cataracts include: Having  high blood pressure. Having had a previous eye injury. Smoking. Being obese. Drinking alcohol heavily. Having been exposed to large amounts of radiation, lead, or other toxic substances. Frequently being exposed to sun or very strong light without eye protection. What are the signs or symptoms? The main symptom of a cataract is blurry vision. Your vision may change or get worse over time. Other symptoms include: Increased glare. Seeing a bright ring or halo around light. Poor night vision. Double vision or seeing shadows in one eye or both eyes. Having trouble seeing or reading up close. Seeing colors that appear faded. How is this diagnosed? This condition is diagnosed with a medical history and eye exam. You should see an eye specialist (optometrist or ophthalmologist). Your health care provider may enlarge (dilate) your pupils with eye drops to see the back of your eye more clearly and look for signs of cataracts or other eye problems. You may also have tests, including: A visual acuity test. This uses a chart to determine the smallest letters that you can see from a specific distance. A slit-lamp exam. This uses a microscope to examine small sections of your eye for abnormalities. Tonometry. This test measures the pressure of the fluid inside your eye. Glare testing. This test shines a light in your eye while you view letters to see whether the bright light affects your vision. How is this treated? Treatment depends on the stage of your cataract. You may benefit from: A new  prescription for eyeglasses or contact lenses. You can also use stronger light, especially for reading. These are mainly for early-stage cataracts. Having cataract surgery if the condition is severely affecting your vision. This is needed for late-stage cataracts. Follow these instructions at home: Lifestyle Use stronger or brighter lighting. Consider using a magnifying glass for reading or other  activities. Become familiar with your surroundings. Having poor vision can put you at greater risk for tripping, falling, or bumping into things. Wear sunglasses and a hat if you are sensitive to bright light or are having problems with glare. Do not use any products that contain nicotine or tobacco. These products include cigarettes, chewing tobacco, and vaping devices, such as e-cigarettes. If you need help quitting, ask your health care provider. General instructions If you are prescribed new eyeglasses or contacts, wear them as told by your health care provider. Take over-the-counter and prescription medicines only as told by your health care provider. Do not change your medicines unless told to by your health care provider. Do not drive or use machinery if your vision is blurry, especially at night. Keep your blood sugar under control if you have diabetes. Keep all follow-up visits. This is important. Contact a health care provider if: Your symptoms get worse. Your vision affects your ability to perform daily activities, especially driving. You have new symptoms. Get help right away if: You have sudden vision loss. You have redness, swelling, or increasing pain in your eye. You develop a sudden, severe headache and increased sensitivity to light. Summary A cataract is a buildup of protein that causes the lens of your eye to become cloudy. Cataracts are very common, especially as people age. Mild cataracts cause mild visual symptoms, while more severe cataracts can cause a significant decrease in quality of life. Mild cataracts can often be treated with a prescription for new glasses or contact lenses, while surgery is often recommended for more severe cataracts. Contact a health care provider if your symptoms get worse or your vision affects your ability to do daily activities. Get help right away if you have sudden vision loss, redness, swelling, or increasing pain in the eye, or you  develop a sudden, severe headache or increased sensitivity to light. This information is not intended to replace advice given to you by your health care provider. Make sure you discuss any questions you have with your health care provider. Document Revised: 10/27/2020 Document Reviewed: 10/27/2020 Elsevier Patient Education  2022 [employer].

## 2021-06-07 NOTE — Telephone Encounter (Signed)
Referral not sent waiting on ofc notes to be completed. thanks °

## 2021-07-01 DIAGNOSIS — Z95810 Presence of automatic (implantable) cardiac defibrillator: Secondary | ICD-10-CM | POA: Diagnosis not present

## 2021-07-07 DIAGNOSIS — M545 Low back pain, unspecified: Secondary | ICD-10-CM | POA: Diagnosis not present

## 2021-07-09 DIAGNOSIS — Z95810 Presence of automatic (implantable) cardiac defibrillator: Secondary | ICD-10-CM | POA: Diagnosis not present

## 2021-07-21 ENCOUNTER — Encounter: Payer: Self-pay | Admitting: Internal Medicine

## 2021-07-21 ENCOUNTER — Ambulatory Visit (INDEPENDENT_AMBULATORY_CARE_PROVIDER_SITE_OTHER): Payer: Medicare Other | Admitting: Internal Medicine

## 2021-07-21 VITALS — BP 124/84 | HR 69 | Temp 97.5°F | Resp 14 | Ht 69.0 in | Wt 125.2 lb

## 2021-07-21 DIAGNOSIS — R1084 Generalized abdominal pain: Secondary | ICD-10-CM

## 2021-07-21 DIAGNOSIS — R309 Painful micturition, unspecified: Secondary | ICD-10-CM

## 2021-07-21 DIAGNOSIS — R39198 Other difficulties with micturition: Secondary | ICD-10-CM

## 2021-07-21 DIAGNOSIS — N200 Calculus of kidney: Secondary | ICD-10-CM

## 2021-07-21 NOTE — Progress Notes (Signed)
Chief Complaint  ?Patient presents with  ? Acute Visit  ?  Pt c/o back pain that radiates to testicular area, and painful urination. Pt has hx of kidney stones in the past, not established with urology. Onset few months ago.   ? ?F/u  ?1. H/o left kidney stones and having trouble urinating and feels like pushing urine in urine stream around a possible kidney stone he is passing and having lower ab cramping nothing tried on chronic pain medications this has been ongoing x few months on flomax 0.8 mg qhs and initially was helping with urine stream but is no longer helping  ? ? ?Review of Systems  ?Constitutional:  Negative for weight loss.  ?HENT:  Negative for hearing loss.   ?Eyes:  Negative for blurred vision.  ?Respiratory:  Negative for shortness of breath.   ?Cardiovascular:  Negative for chest pain.  ?Gastrointestinal:  Positive for abdominal pain. Negative for blood in stool.  ?Genitourinary:  Negative for flank pain.  ?     +trouble with urination h/o kidney stones  ?Musculoskeletal:  Negative for back pain.  ?Skin:  Negative for rash.  ?Neurological:  Negative for headaches.  ?Psychiatric/Behavioral:  Negative for depression.   ?Past Medical History:  ?Diagnosis Date  ? Allergy   ? CAD (coronary artery disease)   ? Depression   ? Frequent headaches   ? GERD (gastroesophageal reflux disease)   ? Headache   ? Hyperlipidemia   ? Hypertension   ? PAD (peripheral artery disease) (HCC)   ? Tobacco abuse   ? ?Past Surgical History:  ?Procedure Laterality Date  ? COLONOSCOPY WITH PROPOFOL N/A 06/20/2017  ? Procedure: COLONOSCOPY WITH PROPOFOL;  Surgeon: Wyline Mood, MD;  Location: [facility] ENDOSCOPY;  Service: Gastroenterology;  Laterality: N/A;  ? COLONOSCOPY WITH PROPOFOL N/A 07/24/2017  ? Procedure: COLONOSCOPY WITH PROPOFOL;  Surgeon: Wyline Mood, MD;  Location: [facility] ENDOSCOPY;  Service: Gastroenterology;  Laterality: N/A;  ? ESOPHAGOGASTRODUODENOSCOPY (EGD) WITH PROPOFOL N/A 06/20/2017  ? Procedure:  ESOPHAGOGASTRODUODENOSCOPY (EGD) WITH PROPOFOL;  Surgeon: Wyline Mood, MD;  Location[facility] ENDOSCOPY;  Service: Gastroenterology;  Laterality: N/A;  ? PERCUTANEOUS CORONARY STENT INTERVENTION (PCI-S)    ? STOMACH SURGERY    ? 2009 [facility] per chart review pt had AAA repair   ? TONSILLECTOMY    ? ?Family History  ?Problem Relation Age of Onset  ? Diabetes Mother   ? Heart disease Mother   ? Hypertension Mother   ? Stroke Mother   ? Prostate cancer Father   ? Cancer Father   ?     prostate cancer in 35s  ? Diabetes Sister   ? Heart disease Sister   ? Ulcers Other   ?     unknown who had ulcers in family   ? ?Social History  ? ?Socioeconomic History  ? Marital status: Married  ?  Spouse name: Not on file  ? Number of children: Not on file  ? Years of education: Not on file  ? Highest education level: Not on file  ?Occupational History  ? Not on file  ?Tobacco Use  ? Smoking status: Every Day  ?  Packs/day: 0.25  ?  Types: Cigarettes  ? Smokeless tobacco: Never  ?Vaping Use  ? Vaping Use: Never used  ?Substance and Sexual Activity  ? Alcohol use: No  ?  Alcohol/week: 0.0 standard drinks  ?  Comment: quit 1 year ago  ? Drug use: No  ? Sexual activity: Not Currently  ?Other  Topics Concern  ? Not on file  ?Social History Narrative  ? Disabled   ? 4 kids 2 living   ? Used to do mill work   ? Smoker since age 74 max 2 pk per week now 1 ppd as of 02/2017   ? Married   ? ?Social Determinants of Health  ? ?Financial Resource Strain: Low Risk   ? Difficulty of Paying Living Expenses: Not hard at all  ?Food Insecurity: No Food Insecurity  ? Worried About Programme researcher, broadcasting/film/video in the Last Year: Never true  ? Ran Out of Food in the Last Year: Never true  ?Transportation Needs: No Transportation Needs  ? Lack of Transportation (Medical): No  ? Lack of Transportation (Non-Medical): No  ?Physical Activity: Not on file  ?Stress: No Stress Concern Present  ? Feeling of Stress : Not at all  ?Social Connections: Unknown  ? Frequency of  Communication with Friends and Family: Not on file  ? Frequency of Social Gatherings with Friends and Family: Not on file  ? Attends Religious Services: Not on file  ? Active Member of Clubs or Organizations: Not on file  ? Attends Banker Meetings: Not on file  ? Marital Status: Married  ?Intimate Partner Violence: Not At Risk  ? Fear of Current or Ex-Partner: No  ? Emotionally Abused: No  ? Physically Abused: No  ? Sexually Abused: No  ? ?Current Meds  ?Medication Sig  ? aspirin 81 MG tablet Take 81 mg by mouth daily.  ? atorvastatin (LIPITOR) 80 MG tablet Take 1 tablet (80 mg total) by mouth daily at 6pm.  ? Baclofen 5 MG TABS Take 1 tablet by mouth twice daily.  ? clopidogrel (PLAVIX) 75 MG tablet 1 pill daily  ? furosemide (LASIX) 20 MG tablet Take 20 mg by mouth daily.  ? gabapentin (NEURONTIN) 300 MG capsule Take 300 mg by mouth 3 (three) times daily.  ? ipratropium (ATROVENT) 0.02 % nebulizer solution Inhale into the lungs.  ? isosorbide mononitrate (IMDUR) 60 MG 24 hr tablet Take 1 tablet by mouth in the morning, at noon, and at bedtime.  ? levocetirizine (XYZAL) 5 MG tablet Take 1 tablet by mouth every evening.  ? metoprolol succinate (TOPROL-XL) 25 MG 24 hr tablet Take 1 tablet (25 mg total) by mouth daily.  ? mirtazapine (REMERON) 30 MG tablet 1 pill nightly  ? nitroGLYCERIN (NITROSTAT) 0.4 MG SL tablet Place 1 tablet (0.4 mg total) under the tongue every 5 (five) minutes as needed for chest pain.  ? oxyCODONE-acetaminophen (PERCOCET) 10-325 MG tablet   ? pantoprazole (PROTONIX) 40 MG tablet Take 1 tablet (40 mg total) by mouth daily. 30 minutes before food  ? pyridOXINE (VITAMIN B-6) 100 MG tablet Take 1 tablet (100 mg total) by mouth daily.  ? tamsulosin (FLOMAX) 0.4 MG CAPS capsule Take 2 capsules by mouth daily after supper.  ? thiamine (VITAMIN B-1) 100 MG tablet Take 100 mg by mouth daily.  ? zolpidem (AMBIEN) 10 MG tablet Take 1 tablet (10 mg total) by mouth at bedtime as needed. for  sleep  ? ?No Known Allergies ?Recent Results (from the past 2160 hour(s))  ?Comprehensive metabolic panel     Status: None  ? Collection Time: 06/07/21 11:40 AM  ?Result Value Ref Range  ? Sodium 139 135 - 145 mEq/L  ? Potassium 4.5 3.5 - 5.1 mEq/L  ? Chloride 101 96 - 112 mEq/L  ? CO2 29 19 - 32 mEq/L  ?  Glucose, Bld 86 70 - 99 mg/dL  ? BUN 23 6 - 23 mg/dL  ? Creatinine, Ser 1.24 0.40 - 1.50 mg/dL  ? Total Bilirubin 0.3 0.2 - 1.2 mg/dL  ? Alkaline Phosphatase 49 39 - 117 U/L  ? AST 19 0 - 37 U/L  ? ALT 15 0 - 53 U/L  ? Total Protein 7.1 6.0 - 8.3 g/dL  ? Albumin 4.5 3.5 - 5.2 g/dL  ? GFR 60.62 >60.00 mL/min  ?  Comment: Calculated using the CKD-EPI Creatinine Equation (2021)  ? Calcium 9.3 8.4 - 10.5 mg/dL  ?Lipid panel     Status: None  ? Collection Time: 06/07/21 11:40 AM  ?Result Value Ref Range  ? Cholesterol 101 0 - 200 mg/dL  ?  Comment: ATP III Classification       Desirable:  < 200 mg/dL               Borderline High:  200 - 239 mg/dL          High:  > = 161240 mg/dL  ? Triglycerides 75.0 0.0 - 149.0 mg/dL  ?  Comment: Normal:  <150 mg/dLBorderline High:  150 - 199 mg/dL  ? HDL 41.30 >39.00 mg/dL  ? VLDL 15.0 0.0 - 40.0 mg/dL  ? LDL Cholesterol 45 0 - 99 mg/dL  ? Total CHOL/HDL Ratio 2   ?  Comment:                Men          Women1/2 Average Risk     3.4          3.3Average Risk          5.0          4.42X Average Risk          9.6          7.13X Average Risk          15.0          11.0                      ? NonHDL 59.57   ?  Comment: NOTE:  Non-HDL goal should be 30 mg/dL higher than patient's LDL goal (i.e. LDL goal of < 70 mg/dL, would have non-HDL goal of < 100 mg/dL)  ?CBC with Differential/Platelet     Status: Abnormal  ? Collection Time: 06/07/21 11:40 AM  ?Result Value Ref Range  ? WBC 6.2 4.0 - 10.5 K/uL  ? RBC 4.57 4.22 - 5.81 Mil/uL  ? Hemoglobin 13.6 13.0 - 17.0 g/dL  ? HCT 41.6 39.0 - 52.0 %  ? MCV 91.1 78.0 - 100.0 fl  ? MCHC 32.7 30.0 - 36.0 g/dL  ? RDW 17.2 (H) 11.5 - 15.5 %  ? Platelets  254.0 150.0 - 400.0 K/uL  ? Neutrophils Relative % 58.2 43.0 - 77.0 %  ? Lymphocytes Relative 30.5 12.0 - 46.0 %  ? Monocytes Relative 9.7 3.0 - 12.0 %  ? Eosinophils Relative 1.0 0.0 - 5.0 %  ? Basophils Relative 0.6 0.0 - 3.0

## 2021-07-21 NOTE — Patient Instructions (Addendum)
Ct scan 9:45 [facility] no food after midnight  ? ?[facility] 1G ? ?Almira Coaster. Marlon Pel, MD, FACS ?Almira Coaster. Marlon Pel, MD, FACS ?Urologist ? ?[place] ?4.93 out of 5 ?Accepting New Patients ?[phone] ? ?Wyline Mood, MD ?Urologist ? ?[place] ?4.92 out of 5 ?Accepting New Patients ?[phone] ?

## 2021-07-22 ENCOUNTER — Ambulatory Visit
Admission: RE | Admit: 2021-07-22 | Discharge: 2021-07-22 | Disposition: A | Discharge status: Home / Self Care | Payer: Medicare Other | Source: Ambulatory Visit | Attending: Internal Medicine | Admitting: Internal Medicine

## 2021-07-22 ENCOUNTER — Telehealth: Payer: Self-pay

## 2021-07-22 DIAGNOSIS — N200 Calculus of kidney: Secondary | ICD-10-CM

## 2021-07-22 DIAGNOSIS — R1084 Generalized abdominal pain: Secondary | ICD-10-CM | POA: Diagnosis not present

## 2021-07-22 DIAGNOSIS — R109 Unspecified abdominal pain: Secondary | ICD-10-CM | POA: Diagnosis not present

## 2021-07-22 LAB — URINALYSIS, ROUTINE W REFLEX MICROSCOPIC
Bilirubin Urine: NEGATIVE
Glucose, UA: NEGATIVE
Hgb urine dipstick: NEGATIVE
Ketones, ur: NEGATIVE
Leukocytes,Ua: NEGATIVE
Nitrite: NEGATIVE
Protein, ur: NEGATIVE
Specific Gravity, Urine: 1.01 (ref 1.001–1.035)
pH: 5.5 (ref 5.0–8.0)

## 2021-07-22 LAB — URINE CULTURE
MICRO NUMBER:: 13259847
Result:: NO GROWTH
SPECIMEN QUALITY:: ADEQUATE

## 2021-07-22 NOTE — Telephone Encounter (Signed)
Lvm for pt to return call in regards to CT results. ? ?Per Dr.Tracy: ?Bilateral kidney stones referred [facility]  ?Diverticulosis  ?+constipation as well rec miralax 1-2 caps daily with colace 1-2 pills over the counter  ?Severe plaque build up in aorta  ?

## 2021-07-27 ENCOUNTER — Telehealth: Payer: Self-pay | Admitting: Internal Medicine

## 2021-07-27 NOTE — Telephone Encounter (Signed)
Pt called in stating that Dr. French Ana referred him to see an Urologist... Pt stated that no one called him to schedule an appointment... Pt was wondering if someone is going to call him to schedule the appointment... Pt requesting callback.Marland Kitchen  ?

## 2021-08-01 DIAGNOSIS — Z79891 Long term (current) use of opiate analgesic: Secondary | ICD-10-CM | POA: Diagnosis not present

## 2021-08-01 DIAGNOSIS — G894 Chronic pain syndrome: Secondary | ICD-10-CM | POA: Diagnosis not present

## 2021-08-04 ENCOUNTER — Telehealth: Payer: Self-pay | Admitting: Internal Medicine

## 2021-08-04 NOTE — Telephone Encounter (Signed)
Pt has not been called for an appointment. Pt said to send referral to a person in [place] ?

## 2021-08-04 NOTE — Telephone Encounter (Signed)
Lft pt vm to call ofc regarding referral to [facility] urology. They're scheduling out to August. Thanks ?

## 2021-08-06 ENCOUNTER — Other Ambulatory Visit: Payer: Self-pay | Admitting: Internal Medicine

## 2021-08-06 DIAGNOSIS — J309 Allergic rhinitis, unspecified: Secondary | ICD-10-CM

## 2021-08-10 ENCOUNTER — Encounter: Payer: Self-pay | Admitting: Urology

## 2021-08-10 ENCOUNTER — Other Ambulatory Visit
Admission: RE | Admit: 2021-08-10 | Discharge: 2021-08-10 | Disposition: A | Discharge status: Home / Self Care | Payer: Medicare Other | Attending: Urology | Admitting: Urology

## 2021-08-10 ENCOUNTER — Ambulatory Visit: Payer: Medicare Other | Admitting: Urology

## 2021-08-10 ENCOUNTER — Other Ambulatory Visit: Payer: Self-pay | Admitting: *Deleted

## 2021-08-10 VITALS — BP 118/83 | HR 87 | Ht 69.0 in | Wt 127.0 lb

## 2021-08-10 DIAGNOSIS — N200 Calculus of kidney: Secondary | ICD-10-CM | POA: Diagnosis not present

## 2021-08-10 DIAGNOSIS — R3915 Urgency of urination: Secondary | ICD-10-CM | POA: Diagnosis not present

## 2021-08-10 DIAGNOSIS — R399 Unspecified symptoms and signs involving the genitourinary system: Secondary | ICD-10-CM

## 2021-08-10 DIAGNOSIS — N529 Male erectile dysfunction, unspecified: Secondary | ICD-10-CM | POA: Diagnosis not present

## 2021-08-10 DIAGNOSIS — R309 Painful micturition, unspecified: Secondary | ICD-10-CM | POA: Diagnosis not present

## 2021-08-10 LAB — URINALYSIS, COMPLETE (UACMP) WITH MICROSCOPIC
Bilirubin Urine: NEGATIVE
Glucose, UA: NEGATIVE mg/dL
Hgb urine dipstick: NEGATIVE
Ketones, ur: NEGATIVE mg/dL
Leukocytes,Ua: NEGATIVE
Nitrite: NEGATIVE
Protein, ur: NEGATIVE mg/dL
Specific Gravity, Urine: 1.02 (ref 1.005–1.030)
pH: 5.5 (ref 5.0–8.0)

## 2021-08-10 LAB — BLADDER SCAN AMB NON-IMAGING

## 2021-08-10 MED ORDER — ALFUZOSIN HCL ER 10 MG PO TB24: 10.0000 mg | ORAL_TABLET | Freq: Every day | ORAL | 11 refills | Status: DC

## 2021-08-10 NOTE — Patient Instructions (Signed)

## 2021-08-10 NOTE — Progress Notes (Signed)
? ?  08/10/2021 ?9:15 AM  ? ?[facility]. ?1955-09-18 ?[id number] ? ?Reason for visit: Follow up urinary symptoms, nephrolithiasis ? ?HPI: ?I saw Martin Hernandez back in clinic for the above issues.  I last saw him in June 2021, and he never followed up as scheduled.  At that time he was on Flomax and finasteride for his urinary symptoms.  He also went a cystoscopy at that time for microscopic hematuria that showed a normal urethra, moderate size prostate, no bladder abnormalities.   ? ?He reports worsening of his urinary symptoms over the last year with some dribbling and urinary discomfort and urgency.  It does not sound like he is taking the finasteride anymore.  He is a challenging historian.  He denies any gross hematuria or dysuria.  Urinalysis today is benign, and PVR is normal at 112 mL. ? ?He also had some abdominal pain which prompted a CT scan without contrast on 07/22/2021.  This showed a stable 1 cm nonobstructive left lower pole stone that has been unchanged over the last few years, with no hydronephrosis, bladder was decompressed, and prostate measured 33 g. ? ?He also has had some problems with erections, but he takes nitroglycerin at least once per week, and would not be a good candidate for PDE 5 inhibitors.  He is also had some problems with ejaculation, that is likely secondary to retrograde ejaculation from his increased dose of the Flomax. ? ?We discussed possible etiologies of his symptoms ranging from BPH, urethral stricture, bladder malignancy.  I recommended starting by changing his Flomax to alfuzosin to see if he gets any improvement in the urinary symptoms, and scheduling cystoscopy in the next few weeks.  Risks and benefits discussed at length.  He would like to continue observation for the nonobstructing left lower pole stone which is very reasonable with the stability and his other comorbidities ? ?Sondra Come, MD ? ?[facility] ?[address] ?[place], [place] [address] ?([phone] ? ?

## 2021-08-11 DIAGNOSIS — H2513 Age-related nuclear cataract, bilateral: Secondary | ICD-10-CM | POA: Diagnosis not present

## 2021-08-23 DIAGNOSIS — H2511 Age-related nuclear cataract, right eye: Secondary | ICD-10-CM | POA: Diagnosis not present

## 2021-08-25 DIAGNOSIS — E782 Mixed hyperlipidemia: Secondary | ICD-10-CM | POA: Diagnosis not present

## 2021-08-25 DIAGNOSIS — I255 Ischemic cardiomyopathy: Secondary | ICD-10-CM | POA: Diagnosis not present

## 2021-08-25 DIAGNOSIS — I10 Essential (primary) hypertension: Secondary | ICD-10-CM | POA: Diagnosis not present

## 2021-08-25 DIAGNOSIS — I2510 Atherosclerotic heart disease of native coronary artery without angina pectoris: Secondary | ICD-10-CM | POA: Diagnosis not present

## 2021-08-25 DIAGNOSIS — I5022 Chronic systolic (congestive) heart failure: Secondary | ICD-10-CM | POA: Diagnosis not present

## 2021-08-29 DIAGNOSIS — M5116 Intervertebral disc disorders with radiculopathy, lumbar region: Secondary | ICD-10-CM | POA: Diagnosis not present

## 2021-09-01 ENCOUNTER — Encounter: Payer: Self-pay | Admitting: Ophthalmology

## 2021-09-08 NOTE — Discharge Instructions (Signed)

## 2021-09-13 ENCOUNTER — Ambulatory Visit
Admission: RE | Admit: 2021-09-13 | Discharge: 2021-09-13 | Disposition: A | Discharge status: Home / Self Care | Payer: Medicare Other | Attending: Ophthalmology | Admitting: Ophthalmology

## 2021-09-13 ENCOUNTER — Other Ambulatory Visit: Payer: Self-pay

## 2021-09-13 ENCOUNTER — Ambulatory Visit: Payer: Medicare Other | Admitting: Anesthesiology

## 2021-09-13 ENCOUNTER — Encounter: Payer: Self-pay | Admitting: Ophthalmology

## 2021-09-13 ENCOUNTER — Encounter
Admission: RE | Disposition: A | Discharge status: Home / Self Care | Payer: Self-pay | Source: Home / Self Care | Attending: Ophthalmology

## 2021-09-13 DIAGNOSIS — Z8679 Personal history of other diseases of the circulatory system: Secondary | ICD-10-CM

## 2021-09-13 DIAGNOSIS — E119 Type 2 diabetes mellitus without complications: Secondary | ICD-10-CM

## 2021-09-13 DIAGNOSIS — R339 Retention of urine, unspecified: Secondary | ICD-10-CM

## 2021-09-13 DIAGNOSIS — J431 Panlobular emphysema: Secondary | ICD-10-CM

## 2021-09-13 DIAGNOSIS — G8929 Other chronic pain: Secondary | ICD-10-CM

## 2021-09-13 DIAGNOSIS — H2511 Age-related nuclear cataract, right eye: Secondary | ICD-10-CM | POA: Insufficient documentation

## 2021-09-13 DIAGNOSIS — F1190 Opioid use, unspecified, uncomplicated: Secondary | ICD-10-CM

## 2021-09-13 DIAGNOSIS — K581 Irritable bowel syndrome with constipation: Secondary | ICD-10-CM

## 2021-09-13 DIAGNOSIS — N401 Enlarged prostate with lower urinary tract symptoms: Secondary | ICD-10-CM

## 2021-09-13 DIAGNOSIS — I509 Heart failure, unspecified: Secondary | ICD-10-CM | POA: Diagnosis not present

## 2021-09-13 DIAGNOSIS — H6991 Unspecified Eustachian tube disorder, right ear: Secondary | ICD-10-CM

## 2021-09-13 DIAGNOSIS — M47816 Spondylosis without myelopathy or radiculopathy, lumbar region: Secondary | ICD-10-CM

## 2021-09-13 DIAGNOSIS — I714 Abdominal aortic aneurysm, without rupture, unspecified: Secondary | ICD-10-CM

## 2021-09-13 DIAGNOSIS — I25118 Atherosclerotic heart disease of native coronary artery with other forms of angina pectoris: Secondary | ICD-10-CM

## 2021-09-13 DIAGNOSIS — I723 Aneurysm of iliac artery: Secondary | ICD-10-CM

## 2021-09-13 DIAGNOSIS — I10 Essential (primary) hypertension: Secondary | ICD-10-CM

## 2021-09-13 DIAGNOSIS — R634 Abnormal weight loss: Secondary | ICD-10-CM

## 2021-09-13 DIAGNOSIS — Z4502 Encounter for adjustment and management of automatic implantable cardiac defibrillator: Secondary | ICD-10-CM

## 2021-09-13 DIAGNOSIS — M48061 Spinal stenosis, lumbar region without neurogenic claudication: Secondary | ICD-10-CM

## 2021-09-13 DIAGNOSIS — M5030 Other cervical disc degeneration, unspecified cervical region: Secondary | ICD-10-CM

## 2021-09-13 DIAGNOSIS — N189 Chronic kidney disease, unspecified: Secondary | ICD-10-CM | POA: Insufficient documentation

## 2021-09-13 DIAGNOSIS — K056 Periodontal disease, unspecified: Secondary | ICD-10-CM

## 2021-09-13 DIAGNOSIS — K219 Gastro-esophageal reflux disease without esophagitis: Secondary | ICD-10-CM

## 2021-09-13 DIAGNOSIS — R935 Abnormal findings on diagnostic imaging of other abdominal regions, including retroperitoneum: Secondary | ICD-10-CM

## 2021-09-13 DIAGNOSIS — I739 Peripheral vascular disease, unspecified: Secondary | ICD-10-CM

## 2021-09-13 DIAGNOSIS — F17210 Nicotine dependence, cigarettes, uncomplicated: Secondary | ICD-10-CM | POA: Diagnosis not present

## 2021-09-13 DIAGNOSIS — E785 Hyperlipidemia, unspecified: Secondary | ICD-10-CM

## 2021-09-13 DIAGNOSIS — N200 Calculus of kidney: Secondary | ICD-10-CM

## 2021-09-13 DIAGNOSIS — I7090 Unspecified atherosclerosis: Secondary | ICD-10-CM

## 2021-09-13 DIAGNOSIS — I2510 Atherosclerotic heart disease of native coronary artery without angina pectoris: Secondary | ICD-10-CM | POA: Insufficient documentation

## 2021-09-13 DIAGNOSIS — R252 Cramp and spasm: Secondary | ICD-10-CM

## 2021-09-13 DIAGNOSIS — I130 Hypertensive heart and chronic kidney disease with heart failure and stage 1 through stage 4 chronic kidney disease, or unspecified chronic kidney disease: Secondary | ICD-10-CM | POA: Insufficient documentation

## 2021-09-13 DIAGNOSIS — M1990 Unspecified osteoarthritis, unspecified site: Secondary | ICD-10-CM

## 2021-09-13 DIAGNOSIS — I4729 Other ventricular tachycardia: Secondary | ICD-10-CM

## 2021-09-13 DIAGNOSIS — Z95810 Presence of automatic (implantable) cardiac defibrillator: Secondary | ICD-10-CM

## 2021-09-13 DIAGNOSIS — R1031 Right lower quadrant pain: Secondary | ICD-10-CM

## 2021-09-13 DIAGNOSIS — R918 Other nonspecific abnormal finding of lung field: Secondary | ICD-10-CM

## 2021-09-13 DIAGNOSIS — M542 Cervicalgia: Secondary | ICD-10-CM

## 2021-09-13 DIAGNOSIS — K047 Periapical abscess without sinus: Secondary | ICD-10-CM

## 2021-09-13 DIAGNOSIS — Z9889 Other specified postprocedural states: Secondary | ICD-10-CM

## 2021-09-13 DIAGNOSIS — F419 Anxiety disorder, unspecified: Secondary | ICD-10-CM

## 2021-09-13 DIAGNOSIS — R194 Change in bowel habit: Secondary | ICD-10-CM

## 2021-09-13 DIAGNOSIS — Z0000 Encounter for general adult medical examination without abnormal findings: Secondary | ICD-10-CM

## 2021-09-13 DIAGNOSIS — N529 Male erectile dysfunction, unspecified: Secondary | ICD-10-CM

## 2021-09-13 DIAGNOSIS — I779 Disorder of arteries and arterioles, unspecified: Secondary | ICD-10-CM

## 2021-09-13 DIAGNOSIS — E559 Vitamin D deficiency, unspecified: Secondary | ICD-10-CM

## 2021-09-13 DIAGNOSIS — H25811 Combined forms of age-related cataract, right eye: Secondary | ICD-10-CM | POA: Diagnosis not present

## 2021-09-13 DIAGNOSIS — M47812 Spondylosis without myelopathy or radiculopathy, cervical region: Secondary | ICD-10-CM

## 2021-09-13 DIAGNOSIS — G4700 Insomnia, unspecified: Secondary | ICD-10-CM

## 2021-09-13 DIAGNOSIS — K2970 Gastritis, unspecified, without bleeding: Secondary | ICD-10-CM

## 2021-09-13 DIAGNOSIS — I200 Unstable angina: Secondary | ICD-10-CM

## 2021-09-13 DIAGNOSIS — Z720 Tobacco use: Secondary | ICD-10-CM

## 2021-09-13 DIAGNOSIS — R6881 Early satiety: Secondary | ICD-10-CM

## 2021-09-13 DIAGNOSIS — D72821 Monocytosis (symptomatic): Secondary | ICD-10-CM

## 2021-09-13 DIAGNOSIS — I255 Ischemic cardiomyopathy: Secondary | ICD-10-CM

## 2021-09-13 DIAGNOSIS — M545 Low back pain, unspecified: Secondary | ICD-10-CM

## 2021-09-13 HISTORY — PX: CATARACT EXTRACTION W/PHACO: SHX586

## 2021-09-13 SURGERY — PHACOEMULSIFICATION, CATARACT, WITH IOL INSERTION
Anesthesia: Monitor Anesthesia Care | Site: Eye | Laterality: Right

## 2021-09-13 MED ORDER — SIGHTPATH DOSE#1 NA CHONDROIT SULF-NA HYALURON 40-17 MG/ML IO SOLN
INTRAOCULAR | Status: DC | PRN
  Administered 2021-09-13: 1 mL via INTRAOCULAR

## 2021-09-13 MED ORDER — SIGHTPATH DOSE#1 BSS IO SOLN
INTRAOCULAR | Status: DC | PRN
  Administered 2021-09-13: 1 mL

## 2021-09-13 MED ORDER — TETRACAINE HCL 0.5 % OP SOLN
1.0000 [drp] | OPHTHALMIC | Status: DC | PRN
Start: 2021-09-13 — End: 2021-09-13
  Administered 2021-09-13 (×3): 1 [drp] via OPHTHALMIC

## 2021-09-13 MED ORDER — SIGHTPATH DOSE#1 BSS IO SOLN
INTRAOCULAR | Status: DC | PRN
  Administered 2021-09-13: 75 mL via OPHTHALMIC

## 2021-09-13 MED ORDER — LACTATED RINGERS IV SOLN: INTRAVENOUS | Status: DC

## 2021-09-13 MED ORDER — SIGHTPATH DOSE#1 BSS IO SOLN
INTRAOCULAR | Status: DC | PRN
  Administered 2021-09-13: 15 mL

## 2021-09-13 MED ORDER — BRIMONIDINE TARTRATE-TIMOLOL 0.2-0.5 % OP SOLN
OPHTHALMIC | Status: DC | PRN
  Administered 2021-09-13: 1 [drp] via OPHTHALMIC

## 2021-09-13 MED ORDER — FENTANYL CITRATE (PF) 100 MCG/2ML IJ SOLN
INTRAMUSCULAR | Status: DC | PRN
Start: 2021-09-13 — End: 2021-09-13
  Administered 2021-09-13: 50 ug via INTRAVENOUS

## 2021-09-13 MED ORDER — MIDAZOLAM HCL 2 MG/2ML IJ SOLN
INTRAMUSCULAR | Status: DC | PRN
  Administered 2021-09-13: 1 mg via INTRAVENOUS

## 2021-09-13 MED ORDER — ARMC OPHTHALMIC DILATING DROPS
1.0000 "application " | OPHTHALMIC | Status: DC | PRN
  Administered 2021-09-13 (×3): 1 via OPHTHALMIC

## 2021-09-13 MED ORDER — MOXIFLOXACIN HCL 0.5 % OP SOLN
OPHTHALMIC | Status: DC | PRN
  Administered 2021-09-13: 0.2 mL via OPHTHALMIC

## 2021-09-13 SURGICAL SUPPLY — 16 items
CANNULA ANT/CHMB 27G (MISCELLANEOUS) IMPLANT
CANNULA ANT/CHMB 27GA (MISCELLANEOUS) IMPLANT
CATARACT SUITE SIGHTPATH (MISCELLANEOUS) ×2 IMPLANT
FEE CATARACT SUITE SIGHTPATH (MISCELLANEOUS) ×1 IMPLANT
GLOVE SURG ENC TEXT LTX SZ8 (GLOVE) ×2 IMPLANT
GLOVE SURG TRIUMPH 8.0 PF LTX (GLOVE) ×2 IMPLANT
LENS IOL TECNIS EYHANCE 21.5 (Intraocular Lens) ×1 IMPLANT
NDL FILTER BLUNT 18X1 1/2 (NEEDLE) ×1 IMPLANT
NEEDLE FILTER BLUNT 18X 1/2SAF (NEEDLE) ×1
NEEDLE FILTER BLUNT 18X1 1/2 (NEEDLE) ×1 IMPLANT
PACK VIT ANT 23G (MISCELLANEOUS) IMPLANT
RING MALYGIN (MISCELLANEOUS) IMPLANT
SUT ETHILON 10-0 CS-B-6CS-B-6 (SUTURE)
SUTURE EHLN 10-0 CS-B-6CS-B-6 (SUTURE) IMPLANT
SYR 3ML LL SCALE MARK (SYRINGE) ×2 IMPLANT
WATER STERILE IRR 250ML POUR (IV SOLUTION) ×2 IMPLANT

## 2021-09-13 NOTE — Anesthesia Preprocedure Evaluation (Signed)
Anesthesia Evaluation  Patient identified by MRN, date of birth, ID band Patient awake    Reviewed: Allergy & Precautions, H&P , NPO status , Patient's Chart, lab work & pertinent test results  Airway Mallampati: II  TM Distance: >3 FB Neck ROM: full    Dental no notable dental hx.    Pulmonary COPD, Current Smoker,    Pulmonary exam normal        Cardiovascular hypertension, On Medications + CAD and +CHF  Normal cardiovascular exam+ Cardiac Defibrillator  Rhythm:regular Rate:Normal  Systolic function was normal. The estimated  ejection fraction was in the range of 50% to 55%.    Neuro/Psych  Headaches,    GI/Hepatic Neg liver ROS, Medicated,  Endo/Other  negative endocrine ROS  Renal/GU CRF  negative genitourinary   Musculoskeletal   Abdominal   Peds  Hematology negative hematology ROS (+)   Anesthesia Other Findings   Reproductive/Obstetrics                             Anesthesia Physical Anesthesia Plan  ASA: 3  Anesthesia Plan: MAC   Post-op Pain Management:    Induction:   PONV Risk Score and Plan: TIVA, Midazolam and Treatment may vary due to age or medical condition  Airway Management Planned:   Additional Equipment:   Intra-op Plan:   Post-operative Plan:   Informed Consent: I have reviewed the patients History and Physical, chart, labs and discussed the procedure including the risks, benefits and alternatives for the proposed anesthesia with the patient or authorized representative who has indicated his/her understanding and acceptance.       Plan Discussed with:   Anesthesia Plan Comments:         Anesthesia Quick Evaluation

## 2021-09-13 NOTE — Anesthesia Postprocedure Evaluation (Signed)
Anesthesia Post Note  Patient: Martin Hernandez.  Procedure(s) Performed: CATARACT EXTRACTION PHACO AND INTRAOCULAR LENS PLACEMENT (IOC) RIGHT (Right: Eye)     Patient location during evaluation: PACU Anesthesia Type: MAC Level of consciousness: awake and alert Pain management: pain level controlled Vital Signs Assessment: post-procedure vital signs reviewed and stable Respiratory status: spontaneous breathing Cardiovascular status: stable Anesthetic complications: no   No notable events documented.  Gillian Scarce

## 2021-09-13 NOTE — Op Note (Signed)
PREOPERATIVE DIAGNOSIS:  Nuclear sclerotic cataract of the right eye.   POSTOPERATIVE DIAGNOSIS:  CATARACT   OPERATIVE PROCEDURE:ORPROCALL@   SURGEON:  Birder Robson, MD.   ANESTHESIA:  Anesthesiologist: Elgie Collard, MD CRNA: Silvana Newness, CRNA  1.      Managed anesthesia care. 2.      0.5ml of Shugarcaine was instilled in the eye following the paracentesis.   COMPLICATIONS:  None.   TECHNIQUE:   Stop and chop   DESCRIPTION OF PROCEDURE:  The patient was examined and consented in the preoperative holding area where the aforementioned topical anesthesia was applied to the right eye and then brought back to the Operating Room where the right eye was prepped and draped in the usual sterile ophthalmic fashion and a lid speculum was placed. A paracentesis was created with the side port blade and the anterior chamber was filled with viscoelastic. A near clear corneal incision was performed with the steel keratome. A continuous curvilinear capsulorrhexis was performed with a cystotome followed by the capsulorrhexis forceps. Hydrodissection and hydrodelineation were carried out with BSS on a blunt cannula. The lens was removed in a stop and chop  technique and the remaining cortical material was removed with the irrigation-aspiration handpiece. The capsular bag was inflated with viscoelastic and the Technis [id number]  lens was placed in the capsular bag without complication. The remaining viscoelastic was removed from the eye with the irrigation-aspiration handpiece. The wounds were hydrated. The anterior chamber was flushed with BSS and the eye was inflated to physiologic pressure. 0.37ml of Vigamox was placed in the anterior chamber. The wounds were found to be water tight. The eye was dressed with Combigan. The patient was given protective glasses to wear throughout the day and a shield with which to sleep tonight. The patient was also given drops with which to begin a drop regimen today and will  follow-up with me in one day. Implant Name Type Inv. Item Serial No. Manufacturer Lot No. LRB No. Used Action  LENS IOL TECNIS EYHANCE 21.5 - [id number] Intraocular Lens LENS IOL TECNIS EYHANCE 21.5 [id number] S[employer]  Right 1 Implanted   Procedure(s) with comments: CATARACT EXTRACTION PHACO AND INTRAOCULAR LENS PLACEMENT (IOC) RIGHT (Right) - 3.53 0:43.1  Electronically signed: Birder Robson 09/13/2021 9:53 AM

## 2021-09-13 NOTE — Transfer of Care (Signed)
Immediate Anesthesia Transfer of Care Note  Patient: Martin Hernandez.  Procedure(s) Performed: CATARACT EXTRACTION PHACO AND INTRAOCULAR LENS PLACEMENT (IOC) RIGHT (Right: Eye)  Patient Location: PACU  Anesthesia Type: MAC  Level of Consciousness: awake, alert  and patient cooperative  Airway and Oxygen Therapy: Patient Spontanous Breathing and Patient connected to supplemental oxygen  Post-op Assessment: Post-op Vital signs reviewed, Patient's Cardiovascular Status Stable, Respiratory Function Stable, Patent Airway and No signs of Nausea or vomiting  Post-op Vital Signs: Reviewed and stable  Complications: No notable events documented.

## 2021-09-13 NOTE — H&P (Signed)
[facility]   Primary Care Physician:  McLean-Scocuzza, Pasty Spillers, MD Ophthalmologist: Dr. Druscilla Brownie  Pre-Procedure History & Physical: HPI:  Martin Ravi. is a 66 y.o. male here for cataract surgery.   Past Medical History:  Diagnosis Date   Allergy    CAD (coronary artery disease)    Depression    Frequent headaches    GERD (gastroesophageal reflux disease)    Headache    Hyperlipidemia    Hypertension    PAD (peripheral artery disease) (HCC)    Tobacco abuse     Past Surgical History:  Procedure Laterality Date   COLONOSCOPY WITH PROPOFOL N/A 06/20/2017   Procedure: COLONOSCOPY WITH PROPOFOL;  Surgeon: Wyline Mood, MD;  Location: [facility] ENDOSCOPY;  Service: Gastroenterology;  Laterality: N/A;   COLONOSCOPY WITH PROPOFOL N/A 07/24/2017   Procedure: COLONOSCOPY WITH PROPOFOL;  Surgeon: Wyline Mood, MD;  Location: [facility] ENDOSCOPY;  Service: Gastroenterology;  Laterality: N/A;   ESOPHAGOGASTRODUODENOSCOPY (EGD) WITH PROPOFOL N/A 06/20/2017   Procedure: ESOPHAGOGASTRODUODENOSCOPY (EGD) WITH PROPOFOL;  Surgeon: Wyline Mood, MD;  Location: [facility] ENDOSCOPY;  Service: Gastroenterology;  Laterality: N/A;   PERCUTANEOUS CORONARY STENT INTERVENTION (PCI-S)     STOMACH SURGERY     2009 [facility] per chart review pt had AAA repair    TONSILLECTOMY      Prior to Admission medications   Medication Sig Start Date End Date Taking? Authorizing Provider  alfuzosin (UROXATRAL) 10 MG 24 hr tablet Take 1 tablet (10 mg total) by mouth daily with breakfast. 08/10/21  Yes Sondra Come, MD  aspirin 81 MG tablet Take 81 mg by mouth daily.   Yes [provider]  atorvastatin (LIPITOR) 80 MG tablet Take 1 tablet (80 mg total) by mouth daily at 6pm. 06/07/21  Yes McLean-Scocuzza, Pasty Spillers, MD  Baclofen 5 MG TABS Take 1 tablet by mouth twice daily. 05/04/21  Yes McLean-Scocuzza, Pasty Spillers, MD  clopidogrel (PLAVIX) 75 MG tablet 1 pill daily 03/19/19  Yes McLean-Scocuzza, Pasty Spillers, MD   furosemide (LASIX) 20 MG tablet Take 20 mg by mouth daily. 02/13/20  Yes [provider]  gabapentin (NEURONTIN) 300 MG capsule Take 300 mg by mouth 3 (three) times daily. 06/03/21  Yes [provider]  isosorbide mononitrate (IMDUR) 60 MG 24 hr tablet Take 1 tablet by mouth in the morning, at noon, and at bedtime. 09/02/19  Yes [provider]  levocetirizine (XYZAL) 5 MG tablet Take 1 tablet by mouth every evening. 08/08/21  Yes McLean-Scocuzza, Pasty Spillers, MD  metoprolol succinate (TOPROL-XL) 25 MG 24 hr tablet Take 1 tablet (25 mg total) by mouth daily. 11/03/20  Yes McLean-Scocuzza, Pasty Spillers, MD  mirtazapine (REMERON) 30 MG tablet 1 pill nightly 11/03/20  Yes McLean-Scocuzza, Pasty Spillers, MD  nitroGLYCERIN (NITROSTAT) 0.4 MG SL tablet Place 1 tablet (0.4 mg total) under the tongue every 5 (five) minutes as needed for chest pain. 10/04/18  Yes McLean-Scocuzza, Pasty Spillers, MD  oxyCODONE-acetaminophen (PERCOCET) 10-325 MG tablet  05/18/16  Yes [provider]  pantoprazole (PROTONIX) 40 MG tablet Take 1 tablet (40 mg total) by mouth daily. 30 minutes before food 11/03/20  Yes McLean-Scocuzza, Pasty Spillers, MD  pyridOXINE (VITAMIN B-6) 100 MG tablet Take 1 tablet (100 mg total) by mouth daily. 06/07/21  Yes McLean-Scocuzza, Pasty Spillers, MD  thiamine (VITAMIN B-1) 100 MG tablet Take 100 mg by mouth daily.   Yes [provider]  zolpidem (AMBIEN) 10 MG tablet Take 1 tablet (10 mg total) by mouth  at bedtime as needed. for sleep 06/07/21  Yes McLean-Scocuzza, Pasty Spillersracy N, MD  ipratropium (ATROVENT) 0.02 % nebulizer solution Inhale into the lungs. 12/07/14   [provider]    Allergies as of 08/15/2021   (No Known Allergies)    Family History  Problem Relation Age of Onset   Diabetes Mother    Heart disease Mother    Hypertension Mother    Stroke Mother    Prostate cancer Father    Cancer Father        prostate cancer in 7150s   Diabetes Sister    Heart disease Sister     Ulcers Other        unknown who had ulcers in family     Social History   Socioeconomic History   Marital status: Married    Spouse name: Not on file   Number of children: Not on file   Years of education: Not on file   Highest education level: Not on file  Occupational History   Not on file  Tobacco Use   Smoking status: Every Day    Packs/day: 0.25    Years: 50.00    Pack years: 12.50    Types: Cigarettes    Passive exposure: Current   Smokeless tobacco: Never   Tobacco comments:    Started smoking age 66  Vaping Use   Vaping Use: Never used  Substance and Sexual Activity   Alcohol use: No    Alcohol/week: 0.0 standard drinks    Comment: quit 1 year ago   Drug use: No   Sexual activity: Not Currently  Other Topics Concern   Not on file  Social History Narrative   Disabled    4 kids 2 living    Used to do mill work    Smoker since age 66 max 2 pk per week now 1 ppd as of 02/2017    Married    Social Determinants of Corporate investment bankerHealth   Financial Resource Strain: Low Risk    Difficulty of Paying Living Expenses: Not hard at all  Food Insecurity: No Food Insecurity   Worried About Programme researcher, broadcasting/film/videounning Out of Food in the Last Year: Never true   Baristaan Out of Food in the Last Year: Never true  Transportation Needs: No Transportation Needs   Lack of Transportation (Medical): No   Lack of Transportation (Non-Medical): No  Physical Activity: Not on file  Stress: No Stress Concern Present   Feeling of Stress : Not at all  Social Connections: Unknown   Frequency of Communication with Friends and Family: Not on file   Frequency of Social Gatherings with Friends and Family: Not on file   Attends Religious Services: Not on file   Active Member of Clubs or Organizations: Not on file   Attends BankerClub or Organization Meetings: Not on file   Marital Status: Married  Catering managerntimate Partner Violence: Not At Risk   Fear of Current or Ex-Partner: No   Emotionally Abused: No   Physically Abused: No    Sexually Abused: No    Review of Systems: See HPI, otherwise negative ROS  Physical Exam: BP 114/84   Pulse (!) 56   Temp 97.8 F (36.6 C) (Temporal)   Ht 5\' 9"  (1.753 m)   Wt 56 kg   SpO2 93%   BMI 18.22 kg/m  General:   Alert, cooperative in NAD Head:  Normocephalic and atraumatic. Respiratory:  Normal work of breathing. Cardiovascular:  RRR  Impression/Plan: Martin SaversLindsay Floyd Madelin HeadingsWatlington Jr.  is here for cataract surgery.  Risks, benefits, limitations, and alternatives regarding cataract surgery have been reviewed with the patient.  Questions have been answered.  All parties agreeable.   Galen Manila, MD  09/13/2021, 9:26 AM

## 2021-09-14 ENCOUNTER — Encounter: Payer: Self-pay | Admitting: Ophthalmology

## 2021-09-14 ENCOUNTER — Other Ambulatory Visit: Payer: Self-pay

## 2021-09-21 ENCOUNTER — Other Ambulatory Visit: Payer: Medicare Other | Admitting: Urology

## 2021-09-21 NOTE — Discharge Instructions (Signed)

## 2021-09-26 DIAGNOSIS — Z79891 Long term (current) use of opiate analgesic: Secondary | ICD-10-CM | POA: Diagnosis not present

## 2021-09-27 ENCOUNTER — Encounter
Admission: RE | Disposition: A | Discharge status: Home / Self Care | Payer: Self-pay | Source: Home / Self Care | Attending: Ophthalmology

## 2021-09-27 ENCOUNTER — Ambulatory Visit: Payer: Medicare Other | Admitting: Anesthesiology

## 2021-09-27 ENCOUNTER — Encounter: Payer: Self-pay | Admitting: Ophthalmology

## 2021-09-27 ENCOUNTER — Other Ambulatory Visit: Payer: Self-pay

## 2021-09-27 ENCOUNTER — Ambulatory Visit
Admission: RE | Admit: 2021-09-27 | Discharge: 2021-09-27 | Disposition: A | Discharge status: Home / Self Care | Payer: Medicare Other | Attending: Ophthalmology | Admitting: Ophthalmology

## 2021-09-27 DIAGNOSIS — I2510 Atherosclerotic heart disease of native coronary artery without angina pectoris: Secondary | ICD-10-CM | POA: Insufficient documentation

## 2021-09-27 DIAGNOSIS — I739 Peripheral vascular disease, unspecified: Secondary | ICD-10-CM | POA: Diagnosis not present

## 2021-09-27 DIAGNOSIS — F17210 Nicotine dependence, cigarettes, uncomplicated: Secondary | ICD-10-CM | POA: Diagnosis not present

## 2021-09-27 DIAGNOSIS — Z95810 Presence of automatic (implantable) cardiac defibrillator: Secondary | ICD-10-CM | POA: Diagnosis not present

## 2021-09-27 DIAGNOSIS — I110 Hypertensive heart disease with heart failure: Secondary | ICD-10-CM | POA: Diagnosis not present

## 2021-09-27 DIAGNOSIS — H25812 Combined forms of age-related cataract, left eye: Secondary | ICD-10-CM | POA: Diagnosis not present

## 2021-09-27 DIAGNOSIS — J449 Chronic obstructive pulmonary disease, unspecified: Secondary | ICD-10-CM | POA: Diagnosis not present

## 2021-09-27 DIAGNOSIS — I509 Heart failure, unspecified: Secondary | ICD-10-CM | POA: Diagnosis not present

## 2021-09-27 DIAGNOSIS — Z8249 Family history of ischemic heart disease and other diseases of the circulatory system: Secondary | ICD-10-CM | POA: Diagnosis not present

## 2021-09-27 DIAGNOSIS — H2512 Age-related nuclear cataract, left eye: Secondary | ICD-10-CM | POA: Diagnosis not present

## 2021-09-27 DIAGNOSIS — Z79899 Other long term (current) drug therapy: Secondary | ICD-10-CM | POA: Diagnosis not present

## 2021-09-27 HISTORY — PX: CATARACT EXTRACTION W/PHACO: SHX586

## 2021-09-27 SURGERY — PHACOEMULSIFICATION, CATARACT, WITH IOL INSERTION
Anesthesia: Monitor Anesthesia Care | Site: Eye | Laterality: Left

## 2021-09-27 MED ORDER — ACETAMINOPHEN 160 MG/5ML PO SOLN: 325.0000 mg | Freq: Once | ORAL | Status: DC

## 2021-09-27 MED ORDER — SIGHTPATH DOSE#1 BSS IO SOLN
INTRAOCULAR | Status: DC | PRN
  Administered 2021-09-27: 1 mL via INTRAMUSCULAR

## 2021-09-27 MED ORDER — BRIMONIDINE TARTRATE-TIMOLOL 0.2-0.5 % OP SOLN
OPHTHALMIC | Status: DC | PRN
  Administered 2021-09-27: 1 [drp] via OPHTHALMIC

## 2021-09-27 MED ORDER — TETRACAINE HCL 0.5 % OP SOLN
1.0000 [drp] | OPHTHALMIC | Status: DC | PRN
Start: 2021-09-27 — End: 2021-09-27
  Administered 2021-09-27 (×3): 1 [drp] via OPHTHALMIC

## 2021-09-27 MED ORDER — SIGHTPATH DOSE#1 BSS IO SOLN
INTRAOCULAR | Status: DC | PRN
  Administered 2021-09-27: 15 mL

## 2021-09-27 MED ORDER — ACETAMINOPHEN 325 MG PO TABS: 325.0000 mg | ORAL_TABLET | Freq: Once | ORAL | Status: DC

## 2021-09-27 MED ORDER — ARMC OPHTHALMIC DILATING DROPS
1.0000 "application " | OPHTHALMIC | Status: DC | PRN
  Administered 2021-09-27 (×3): 1 via OPHTHALMIC

## 2021-09-27 MED ORDER — MIDAZOLAM HCL 2 MG/2ML IJ SOLN
INTRAMUSCULAR | Status: DC | PRN
  Administered 2021-09-27: 1 mg via INTRAVENOUS

## 2021-09-27 MED ORDER — SIGHTPATH DOSE#1 BSS IO SOLN
INTRAOCULAR | Status: DC | PRN
  Administered 2021-09-27: 56 mL via OPHTHALMIC

## 2021-09-27 MED ORDER — LACTATED RINGERS IV SOLN: INTRAVENOUS | Status: DC

## 2021-09-27 MED ORDER — MOXIFLOXACIN HCL 0.5 % OP SOLN
OPHTHALMIC | Status: DC | PRN
  Administered 2021-09-27: 0.2 mL via OPHTHALMIC

## 2021-09-27 MED ORDER — FENTANYL CITRATE (PF) 100 MCG/2ML IJ SOLN
INTRAMUSCULAR | Status: DC | PRN
  Administered 2021-09-27: 50 ug via INTRAVENOUS

## 2021-09-27 MED ORDER — SIGHTPATH DOSE#1 NA CHONDROIT SULF-NA HYALURON 40-17 MG/ML IO SOLN
INTRAOCULAR | Status: DC | PRN
  Administered 2021-09-27: 1 mL via INTRAOCULAR

## 2021-09-27 SURGICAL SUPPLY — 10 items
CATARACT SUITE SIGHTPATH (MISCELLANEOUS) ×2 IMPLANT
FEE CATARACT SUITE SIGHTPATH (MISCELLANEOUS) ×1 IMPLANT
GLOVE SURG ENC TEXT LTX SZ8 (GLOVE) ×2 IMPLANT
GLOVE SURG TRIUMPH 8.0 PF LTX (GLOVE) ×2 IMPLANT
LENS IOL TECNIS EYHANCE 21.0 (Intraocular Lens) ×1 IMPLANT
NDL FILTER BLUNT 18X1 1/2 (NEEDLE) ×1 IMPLANT
NEEDLE FILTER BLUNT 18X 1/2SAF (NEEDLE) ×1
NEEDLE FILTER BLUNT 18X1 1/2 (NEEDLE) ×1 IMPLANT
SYR 3ML LL SCALE MARK (SYRINGE) ×2 IMPLANT
WATER STERILE IRR 250ML POUR (IV SOLUTION) ×2 IMPLANT

## 2021-09-27 NOTE — Anesthesia Postprocedure Evaluation (Signed)
Anesthesia Post Note  Patient: Martin Hernandez.  Procedure(s) Performed: CATARACT EXTRACTION PHACO AND INTRAOCULAR LENS PLACEMENT (IOC) LEFT 4.79 00:25.3 (Left: Eye)     Patient location during evaluation: PACU Anesthesia Type: MAC Level of consciousness: awake and alert and oriented Pain management: satisfactory to patient Vital Signs Assessment: post-procedure vital signs reviewed and stable Respiratory status: spontaneous breathing, nonlabored ventilation and respiratory function stable Cardiovascular status: blood pressure returned to baseline and stable Postop Assessment: Adequate PO intake and No signs of nausea or vomiting Anesthetic complications: no   No notable events documented.  Raliegh Ip

## 2021-09-27 NOTE — Anesthesia Preprocedure Evaluation (Signed)
Anesthesia Evaluation  Patient identified by MRN, date of birth, ID band Patient awake    Reviewed: Allergy & Precautions, H&P , NPO status , Patient's Chart, lab work & pertinent test results  Airway Mallampati: II  TM Distance: >3 FB Neck ROM: full    Dental  (+) Poor Dentition, Missing   Pulmonary COPD, Current SmokerPatient did not abstain from smoking.,    Pulmonary exam normal        Cardiovascular hypertension, On Medications + CAD, + Peripheral Vascular Disease and +CHF  Normal cardiovascular exam+ Cardiac Defibrillator  Rhythm:regular Rate:Normal  Systolic function was normal. The estimated  ejection fraction was in the range of 50% to 55%.    Neuro/Psych  Headaches, PSYCHIATRIC DISORDERS    GI/Hepatic Neg liver ROS,   Endo/Other  negative endocrine ROS  Renal/GU   negative genitourinary   Musculoskeletal   Abdominal   Peds  Hematology negative hematology ROS (+)   Anesthesia Other Findings   Reproductive/Obstetrics                             Anesthesia Physical  Anesthesia Plan  ASA: 3  Anesthesia Plan: MAC   Post-op Pain Management:    Induction:   PONV Risk Score and Plan: 0 and TIVA, Midazolam and Treatment may vary due to age or medical condition  Airway Management Planned:   Additional Equipment:   Intra-op Plan:   Post-operative Plan:   Informed Consent: I have reviewed the patients History and Physical, chart, labs and discussed the procedure including the risks, benefits and alternatives for the proposed anesthesia with the patient or authorized representative who has indicated his/her understanding and acceptance.       Plan Discussed with:   Anesthesia Plan Comments:         Anesthesia Quick Evaluation

## 2021-09-27 NOTE — H&P (Signed)
[facility]   Primary Care Physician:  McLean-Scocuzza, Pasty Spillers, MD Ophthalmologist: Dr. Maren Reamer  Pre-Procedure History & Physical: HPI:  Martin Hernandez. is a 66 y.o. male here for cataract surgery.   Past Medical History:  Diagnosis Date   Allergy    CAD (coronary artery disease)    Depression    Frequent headaches    GERD (gastroesophageal reflux disease)    Headache    Hyperlipidemia    Hypertension    PAD (peripheral artery disease) (HCC)    Tobacco abuse     Past Surgical History:  Procedure Laterality Date   CATARACT EXTRACTION W/PHACO Right 09/13/2021   Procedure: CATARACT EXTRACTION PHACO AND INTRAOCULAR LENS PLACEMENT (IOC) RIGHT;  Surgeon: Galen Manila, MD;  Location: [facility];  Service: Ophthalmology;  Laterality: Right;  3.53 0:43.1   COLONOSCOPY WITH PROPOFOL N/A 06/20/2017   Procedure: COLONOSCOPY WITH PROPOFOL;  Surgeon: Wyline Mood, MD;  Location: [facility] ENDOSCOPY;  Service: Gastroenterology;  Laterality: N/A;   COLONOSCOPY WITH PROPOFOL N/A 07/24/2017   Procedure: COLONOSCOPY WITH PROPOFOL;  Surgeon: Wyline Mood, MD;  Location: [facility] ENDOSCOPY;  Service: Gastroenterology;  Laterality: N/A;   ESOPHAGOGASTRODUODENOSCOPY (EGD) WITH PROPOFOL N/A 06/20/2017   Procedure: ESOPHAGOGASTRODUODENOSCOPY (EGD) WITH PROPOFOL;  Surgeon: Wyline Mood, MD;  Location: [facility] ENDOSCOPY;  Service: Gastroenterology;  Laterality: N/A;   PERCUTANEOUS CORONARY STENT INTERVENTION (PCI-S)     STOMACH SURGERY     2009 [facility] per chart review pt had AAA repair    TONSILLECTOMY      Prior to Admission medications   Medication Sig Start Date End Date Taking? Authorizing Provider  aspirin 81 MG tablet Take 81 mg by mouth daily.   Yes [provider]  alfuzosin (UROXATRAL) 10 MG 24 hr tablet Take 1 tablet (10 mg total) by mouth daily with breakfast. 08/10/21   Sondra Come, MD  atorvastatin (LIPITOR) 80 MG tablet Take 1 tablet (80 mg total) by mouth  daily at 6pm. 06/07/21   McLean-Scocuzza, Pasty Spillers, MD  Baclofen 5 MG TABS Take 1 tablet by mouth twice daily. 05/04/21   McLean-Scocuzza, Pasty Spillers, MD  clopidogrel (PLAVIX) 75 MG tablet 1 pill daily 03/19/19   McLean-Scocuzza, Pasty Spillers, MD  furosemide (LASIX) 20 MG tablet Take 20 mg by mouth daily. 02/13/20   [provider]  gabapentin (NEURONTIN) 300 MG capsule Take 300 mg by mouth 3 (three) times daily. 06/03/21   [provider]  ipratropium (ATROVENT) 0.02 % nebulizer solution Inhale into the lungs. 12/07/14   [provider]  isosorbide mononitrate (IMDUR) 60 MG 24 hr tablet Take 1 tablet by mouth in the morning, at noon, and at bedtime. 09/02/19   [provider]  levocetirizine (XYZAL) 5 MG tablet Take 1 tablet by mouth every evening. 08/08/21   McLean-Scocuzza, Pasty Spillers, MD  metoprolol succinate (TOPROL-XL) 25 MG 24 hr tablet Take 1 tablet (25 mg total) by mouth daily. 11/03/20   McLean-Scocuzza, Pasty Spillers, MD  mirtazapine (REMERON) 30 MG tablet 1 pill nightly 11/03/20   McLean-Scocuzza, Pasty Spillers, MD  nitroGLYCERIN (NITROSTAT) 0.4 MG SL tablet Place 1 tablet (0.4 mg total) under the tongue every 5 (five) minutes as needed for chest pain. 10/04/18   McLean-Scocuzza, Pasty Spillers, MD  oxyCODONE-acetaminophen (PERCOCET) 10-325 MG tablet  05/18/16   [provider]  pantoprazole (PROTONIX) 40 MG tablet Take 1 tablet (40 mg total) by mouth daily. 30 minutes before food 11/03/20   McLean-Scocuzza, Pasty Spillers, MD  pyridOXINE (VITAMIN B-6) 100 MG tablet Take 1 tablet (100 mg total) by mouth daily. 06/07/21   McLean-Scocuzza, Pasty Spillers, MD  thiamine (VITAMIN B-1) 100 MG tablet Take 100 mg by mouth daily.    [provider]  zolpidem (AMBIEN) 10 MG tablet Take 1 tablet (10 mg total) by mouth at bedtime as needed. for sleep 06/07/21   McLean-Scocuzza, Pasty Spillers, MD    Allergies as of 08/15/2021   (No Known Allergies)    Family History  Problem Relation Age of Onset   Diabetes  Mother    Heart disease Mother    Hypertension Mother    Stroke Mother    Prostate cancer Father    Cancer Father        prostate cancer in 19s   Diabetes Sister    Heart disease Sister    Ulcers Other        unknown who had ulcers in family     Social History   Socioeconomic History   Marital status: Married    Spouse name: Not on file   Number of children: Not on file   Years of education: Not on file   Highest education level: Not on file  Occupational History   Not on file  Tobacco Use   Smoking status: Every Day    Packs/day: 0.25    Years: 50.00    Total pack years: 12.50    Types: Cigarettes    Passive exposure: Current   Smokeless tobacco: Never   Tobacco comments:    Started smoking age 89  Vaping Use   Vaping Use: Never used  Substance and Sexual Activity   Alcohol use: No    Alcohol/week: 0.0 standard drinks of alcohol    Comment: quit 1 year ago   Drug use: No   Sexual activity: Not Currently  Other Topics Concern   Not on file  Social History Narrative   Disabled    4 kids 2 living    Used to do mill work    Smoker since age 42 max 2 pk per week now 1 ppd as of 02/2017    Married    Social Determinants of Health   Financial Resource Strain: Low Risk  (05/24/2021)   Overall Financial Resource Strain (CARDIA)    Difficulty of Paying Living Expenses: Not hard at all  Food Insecurity: No Food Insecurity (05/24/2021)   Hunger Vital Sign    Worried About Running Out of Food in the Last Year: Never true    Ran Out of Food in the Last Year: Never true  Transportation Needs: No Transportation Needs (05/24/2021)   PRAPARE - Administrator, Civil Service (Medical): No    Lack of Transportation (Non-Medical): No  Physical Activity: Not on file  Stress: No Stress Concern Present (05/24/2021)   [employer] of Occupational Health - Occupational Stress Questionnaire    Feeling of Stress : Not at all  Social Connections: Unknown (05/24/2021)    Social Connection and Isolation Panel [NHANES]    Frequency of Communication with Friends and Family: Not on file    Frequency of Social Gatherings with Friends and Family: Not on file    Attends Religious Services: Not on file    Active Member of Clubs or Organizations: Not on file    Attends Banker Meetings: Not on file    Marital Status: Married  Intimate Partner Violence: Not At Risk (05/24/2021)   Humiliation, Afraid, Rape, and  Kick questionnaire    Fear of Current or Ex-Partner: No    Emotionally Abused: No    Physically Abused: No    Sexually Abused: No    Review of Systems: See HPI, otherwise negative ROS  Physical Exam: BP 120/85   Pulse (!) 57   Temp (!) 97.5 F (36.4 C) (Temporal)   Resp 15   Ht 5\' 9"  (1.753 m)   Wt 56.8 kg   SpO2 98%   BMI 18.50 kg/m  General:   Alert, cooperative in NAD Head:  Normocephalic and atraumatic. Respiratory:  Normal work of breathing. Cardiovascular:  RRR  Impression/Plan: Gordy Savers. is here for cataract surgery.  Risks, benefits, limitations, and alternatives regarding cataract surgery have been reviewed with the patient.  Questions have been answered.  All parties agreeable.   Madelin Headings, MD  09/27/2021, 10:02 AM

## 2021-09-27 NOTE — Op Note (Signed)
PREOPERATIVE DIAGNOSIS:  Nuclear sclerotic cataract of the left eye.   POSTOPERATIVE DIAGNOSIS:  Nuclear sclerotic cataract of the left eye.   OPERATIVE PROCEDURE:ORPROCALL@   SURGEON:  Galen Manila, MD.   ANESTHESIA:  Anesthesiologist: Ranee Gosselin, MD  1.      Managed anesthesia care. 2.     0.67ml of Shugarcaine was instilled following the paracentesis   COMPLICATIONS:  None.   TECHNIQUE:   Stop and chop   DESCRIPTION OF PROCEDURE:  The patient was examined and consented in the preoperative holding area where the aforementioned topical anesthesia was applied to the left eye and then brought back to the Operating Room where the left eye was prepped and draped in the usual sterile ophthalmic fashion and a lid speculum was placed. A paracentesis was created with the side port blade and the anterior chamber was filled with viscoelastic. A near clear corneal incision was performed with the steel keratome. A continuous curvilinear capsulorrhexis was performed with a cystotome followed by the capsulorrhexis forceps. Hydrodissection and hydrodelineation were carried out with BSS on a blunt cannula. The lens was removed in a stop and chop  technique and the remaining cortical material was removed with the irrigation-aspiration handpiece. The capsular bag was inflated with viscoelastic and the Technis [id number] lens was placed in the capsular bag without complication. The remaining viscoelastic was removed from the eye with the irrigation-aspiration handpiece. The wounds were hydrated. The anterior chamber was flushed with BSS and the eye was inflated to physiologic pressure. 0.24ml Vigamox was placed in the anterior chamber. The wounds were found to be water tight. The eye was dressed with Combigan. The patient was given protective glasses to wear throughout the day and a shield with which to sleep tonight. The patient was also given drops with which to begin a drop regimen today and will follow-up  with me in one day. Implant Name Type Inv. Item Serial No. Manufacturer Lot No. LRB No. Used Action  LENS IOL TECNIS EYHANCE 21.0 - [id number] Intraocular Lens LENS IOL TECNIS EYHANCE 21.0 [id number] [employer]  Left 1 Implanted    Procedure(s): CATARACT EXTRACTION PHACO AND INTRAOCULAR LENS PLACEMENT (IOC) LEFT 4.79 00:25.3 (Left)  Electronically signed: Galen Manila 09/27/2021 10:28 AM

## 2021-09-27 NOTE — Transfer of Care (Signed)
Immediate Anesthesia Transfer of Care Note  Patient: Martin Hernandez.  Procedure(s) Performed: CATARACT EXTRACTION PHACO AND INTRAOCULAR LENS PLACEMENT (IOC) LEFT 4.79 00:25.3 (Left: Eye)  Patient Location: PACU  Anesthesia Type: MAC  Level of Consciousness: awake, alert  and patient cooperative  Airway and Oxygen Therapy: Patient Spontanous Breathing and Patient connected to supplemental oxygen  Post-op Assessment: Post-op Vital signs reviewed, Patient's Cardiovascular Status Stable, Respiratory Function Stable, Patent Airway and No signs of Nausea or vomiting  Post-op Vital Signs: Reviewed and stable  Complications: No notable events documented.

## 2021-09-27 NOTE — Anesthesia Procedure Notes (Signed)
Procedure Name: MAC Date/Time: 09/27/2021 10:09 AM  Performed by: Dionne Bucy, CRNAPre-anesthesia Checklist: Patient identified, Emergency Drugs available, Suction available, Patient being monitored and Timeout performed Patient Re-evaluated:Patient Re-evaluated prior to induction Oxygen Delivery Method: Nasal cannula Placement Confirmation: positive ETCO2

## 2021-09-28 ENCOUNTER — Encounter: Payer: Self-pay | Admitting: Ophthalmology

## 2021-10-05 ENCOUNTER — Other Ambulatory Visit: Payer: Self-pay | Admitting: Internal Medicine

## 2021-10-05 DIAGNOSIS — G4700 Insomnia, unspecified: Secondary | ICD-10-CM

## 2021-10-05 DIAGNOSIS — N200 Calculus of kidney: Secondary | ICD-10-CM

## 2021-10-05 DIAGNOSIS — I2510 Atherosclerotic heart disease of native coronary artery without angina pectoris: Secondary | ICD-10-CM

## 2021-10-05 DIAGNOSIS — R39198 Other difficulties with micturition: Secondary | ICD-10-CM

## 2021-10-05 DIAGNOSIS — F32A Depression, unspecified: Secondary | ICD-10-CM

## 2021-10-05 DIAGNOSIS — K2970 Gastritis, unspecified, without bleeding: Secondary | ICD-10-CM

## 2021-10-05 MED ORDER — PANTOPRAZOLE SODIUM 40 MG PO TBEC: DELAYED_RELEASE_TABLET | ORAL | 3 refills | Status: AC

## 2021-10-05 MED ORDER — METOPROLOL SUCCINATE ER 25 MG PO TB24: 25.0000 mg | ORAL_TABLET | Freq: Every day | ORAL | 3 refills | Status: AC

## 2021-10-05 MED ORDER — ALFUZOSIN HCL ER 10 MG PO TB24: 10.0000 mg | ORAL_TABLET | Freq: Every day | ORAL | 3 refills | Status: DC

## 2021-10-05 MED ORDER — MIRTAZAPINE 30 MG PO TABS: ORAL_TABLET | ORAL | 2 refills | Status: DC

## 2021-10-08 DIAGNOSIS — Z95810 Presence of automatic (implantable) cardiac defibrillator: Secondary | ICD-10-CM | POA: Diagnosis not present

## 2021-10-10 DIAGNOSIS — M5416 Radiculopathy, lumbar region: Secondary | ICD-10-CM | POA: Diagnosis not present

## 2021-10-11 DIAGNOSIS — Z95810 Presence of automatic (implantable) cardiac defibrillator: Secondary | ICD-10-CM | POA: Diagnosis not present

## 2021-10-24 DIAGNOSIS — M25519 Pain in unspecified shoulder: Secondary | ICD-10-CM | POA: Diagnosis not present

## 2021-10-27 DIAGNOSIS — Z961 Presence of intraocular lens: Secondary | ICD-10-CM | POA: Diagnosis not present

## 2021-11-03 DIAGNOSIS — I25119 Atherosclerotic heart disease of native coronary artery with unspecified angina pectoris: Secondary | ICD-10-CM | POA: Diagnosis not present

## 2021-11-04 ENCOUNTER — Other Ambulatory Visit: Payer: Self-pay | Admitting: Internal Medicine

## 2021-11-04 DIAGNOSIS — G4700 Insomnia, unspecified: Secondary | ICD-10-CM

## 2021-11-08 DIAGNOSIS — M25512 Pain in left shoulder: Secondary | ICD-10-CM | POA: Diagnosis not present

## 2021-11-28 DIAGNOSIS — M5116 Intervertebral disc disorders with radiculopathy, lumbar region: Secondary | ICD-10-CM | POA: Diagnosis not present

## 2021-12-08 ENCOUNTER — Ambulatory Visit (INDEPENDENT_AMBULATORY_CARE_PROVIDER_SITE_OTHER): Payer: Medicare Other | Admitting: Internal Medicine

## 2021-12-08 ENCOUNTER — Encounter: Payer: Self-pay | Admitting: Internal Medicine

## 2021-12-08 VITALS — BP 98/62 | HR 70 | Temp 98.2°F | Ht 69.0 in | Wt 118.6 lb

## 2021-12-08 DIAGNOSIS — M545 Low back pain, unspecified: Secondary | ICD-10-CM

## 2021-12-08 DIAGNOSIS — I739 Peripheral vascular disease, unspecified: Secondary | ICD-10-CM | POA: Diagnosis not present

## 2021-12-08 DIAGNOSIS — F1190 Opioid use, unspecified, uncomplicated: Secondary | ICD-10-CM

## 2021-12-08 DIAGNOSIS — Z1329 Encounter for screening for other suspected endocrine disorder: Secondary | ICD-10-CM

## 2021-12-08 DIAGNOSIS — N400 Enlarged prostate without lower urinary tract symptoms: Secondary | ICD-10-CM

## 2021-12-08 DIAGNOSIS — I25118 Atherosclerotic heart disease of native coronary artery with other forms of angina pectoris: Secondary | ICD-10-CM | POA: Diagnosis not present

## 2021-12-08 DIAGNOSIS — R7303 Prediabetes: Secondary | ICD-10-CM | POA: Diagnosis not present

## 2021-12-08 DIAGNOSIS — G4700 Insomnia, unspecified: Secondary | ICD-10-CM | POA: Diagnosis not present

## 2021-12-08 DIAGNOSIS — G8929 Other chronic pain: Secondary | ICD-10-CM | POA: Diagnosis not present

## 2021-12-08 DIAGNOSIS — I10 Essential (primary) hypertension: Secondary | ICD-10-CM

## 2021-12-08 MED ORDER — ZOLPIDEM TARTRATE 10 MG PO TABS: 10.0000 mg | ORAL_TABLET | Freq: Every evening | ORAL | 1 refills | Status: DC | PRN

## 2021-12-08 NOTE — Patient Instructions (Addendum)
Discuss about pain medication taking you down to 450 mcg 2x per day   Dr. Clent Ridges or Dr. Lorin Picket (new PCP)

## 2021-12-08 NOTE — Progress Notes (Signed)
Chief Complaint  Patient presents with   Follow-up    6 month f/u with no other concerns   F/u with wife 1. Chronic pain he follows with pain clinic [place] 600 mg bid (percecet was dc'ed) but this is making him sleepy but is helping with pain disc with pt to disc with pain clinic about reducing dose to 450 mg bid 2. Pad/Cad/htn/AICD with hypotension today it could be due to new pain meds will disc with cardiology at [facility] cardiology Dr. Dierdre Highman if they want to change any medications on lipitor 80, plavix 75, lasix 20 mg qd, imdur 60 mg qd toprol 25 xl qd  Echo 04/22/2018  Study Conclusions   - Left ventricle: Systolic function was normal. The estimated    ejection fraction was in the range of 50% to 55%.    Review of Systems  Constitutional:  Negative for weight loss.  HENT:  Negative for hearing loss.   Eyes:  Negative for blurred vision.  Respiratory:  Negative for shortness of breath.   Cardiovascular:  Negative for chest pain.  Gastrointestinal:  Negative for abdominal pain and blood in stool.  Musculoskeletal:  Negative for back pain.  Skin:  Negative for rash.  Neurological:  Positive for dizziness. Negative for headaches.  Psychiatric/Behavioral:  Negative for depression.    Past Medical History:  Diagnosis Date   Allergy    CAD (coronary artery disease)    Depression    Frequent headaches    GERD (gastroesophageal reflux disease)    Headache    Hyperlipidemia    Hypertension    PAD (peripheral artery disease) (HCC)    Tobacco abuse    Past Surgical History:  Procedure Laterality Date   CATARACT EXTRACTION W/PHACO Right 09/13/2021   Procedure: CATARACT EXTRACTION PHACO AND INTRAOCULAR LENS PLACEMENT (IOC) RIGHT;  Surgeon: Galen Manila, MD;  Location: [facility];  Service: Ophthalmology;  Laterality: Right;  3.53 0:43.1   CATARACT EXTRACTION W/PHACO Left 09/27/2021   Procedure: CATARACT EXTRACTION PHACO AND INTRAOCULAR LENS PLACEMENT (IOC) LEFT 4.79  00:25.3;  Surgeon: Galen Manila, MD;  Location: [facility];  Service: Ophthalmology;  Laterality: Left;   COLONOSCOPY WITH PROPOFOL N/A 06/20/2017   Procedure: COLONOSCOPY WITH PROPOFOL;  Surgeon: Wyline Mood, MD;  Location: [facility] ENDOSCOPY;  Service: Gastroenterology;  Laterality: N/A;   COLONOSCOPY WITH PROPOFOL N/A 07/24/2017   Procedure: COLONOSCOPY WITH PROPOFOL;  Surgeon: Wyline Mood, MD;  Location: [facility] ENDOSCOPY;  Service: Gastroenterology;  Laterality: N/A;   ESOPHAGOGASTRODUODENOSCOPY (EGD) WITH PROPOFOL N/A 06/20/2017   Procedure: ESOPHAGOGASTRODUODENOSCOPY (EGD) WITH PROPOFOL;  Surgeon: Wyline Mood, MD;  Location: [facility] ENDOSCOPY;  Service: Gastroenterology;  Laterality: N/A;   PERCUTANEOUS CORONARY STENT INTERVENTION (PCI-S)     STOMACH SURGERY     2009 [facility] per chart review pt had AAA repair    TONSILLECTOMY     Family History  Problem Relation Age of Onset   Diabetes Mother    Heart disease Mother    Hypertension Mother    Stroke Mother    Prostate cancer Father    Cancer Father        prostate cancer in 22s   Diabetes Sister    Heart disease Sister    Ulcers Other        unknown who had ulcers in family    Social History   Socioeconomic History   Marital status: Married    Spouse name: Not on file   Number of children: Not on file  Years of education: Not on file   Highest education level: Not on file  Occupational History   Not on file  Tobacco Use   Smoking status: Every Day    Packs/day: 0.25    Years: 50.00    Total pack years: 12.50    Types: Cigarettes    Passive exposure: Current   Smokeless tobacco: Never   Tobacco comments:    Started smoking age 46  Vaping Use   Vaping Use: Never used  Substance and Sexual Activity   Alcohol use: No    Alcohol/week: 0.0 standard drinks of alcohol    Comment: quit 1 year ago   Drug use: No   Sexual activity: Not Currently  Other Topics Concern   Not on file  Social History Narrative    Disabled    4 kids 2 living    Used to do mill work    Smoker since age 68 max 2 pk per week now 1 ppd as of 02/2017    Married    Social Determinants of Health   Financial Resource Strain: Low Risk  (05/24/2021)   Overall Financial Resource Strain (CARDIA)    Difficulty of Paying Living Expenses: Not hard at all  Food Insecurity: No Food Insecurity (05/24/2021)   Hunger Vital Sign    Worried About Running Out of Food in the Last Year: Never true    Ran Out of Food in the Last Year: Never true  Transportation Needs: No Transportation Needs (05/24/2021)   PRAPARE - Administrator, Civil Service (Medical): No    Lack of Transportation (Non-Medical): No  Physical Activity: Not on file  Stress: No Stress Concern Present (05/24/2021)   [employer] of Occupational Health - Occupational Stress Questionnaire    Feeling of Stress : Not at all  Social Connections: Unknown (05/24/2021)   Social Connection and Isolation Panel [NHANES]    Frequency of Communication with Friends and Family: Not on file    Frequency of Social Gatherings with Friends and Family: Not on file    Attends Religious Services: Not on file    Active Member of Clubs or Organizations: Not on file    Attends Banker Meetings: Not on file    Marital Status: Married  Intimate Partner Violence: Not At Risk (05/24/2021)   Humiliation, Afraid, Rape, and Kick questionnaire    Fear of Current or Ex-Partner: No    Emotionally Abused: No    Physically Abused: No    Sexually Abused: No   Current Meds  Medication Sig   alfuzosin (UROXATRAL) 10 MG 24 hr tablet Take 1 tablet (10 mg total) by mouth daily with breakfast. Dc flomax 0.4   aspirin 81 MG tablet Take 81 mg by mouth daily.   atorvastatin (LIPITOR) 80 MG tablet Take 1 tablet (80 mg total) by mouth daily at 6pm.   Baclofen 5 MG TABS Take 1 tablet by mouth twice daily.   Buprenorphine HCl (BELBUCA) 600 MCG FILM Place inside cheek 2 (two) times  daily.   clopidogrel (PLAVIX) 75 MG tablet 1 pill daily   furosemide (LASIX) 20 MG tablet Take 20 mg by mouth daily.   gabapentin (NEURONTIN) 300 MG capsule Take 300 mg by mouth 3 (three) times daily.   ipratropium (ATROVENT) 0.02 % nebulizer solution Inhale into the lungs.   isosorbide mononitrate (IMDUR) 60 MG 24 hr tablet Take 1 tablet by mouth in the morning, at noon, and at bedtime.   levocetirizine (  XYZAL) 5 MG tablet Take 1 tablet by mouth every evening.   metoprolol succinate (TOPROL-XL) 25 MG 24 hr tablet Take 1 tablet (25 mg total) by mouth daily.   mirtazapine (REMERON) 30 MG tablet Take 1 tablet by mouth nightly.   nitroGLYCERIN (NITROSTAT) 0.4 MG SL tablet Place 1 tablet (0.4 mg total) under the tongue every 5 (five) minutes as needed for chest pain.   pantoprazole (PROTONIX) 40 MG tablet Take 1 tablet by mouth daily 30 minutes before food.   pyridOXINE (VITAMIN B-6) 100 MG tablet Take 1 tablet (100 mg total) by mouth daily.   thiamine (VITAMIN B-1) 100 MG tablet Take 100 mg by mouth daily.   [DISCONTINUED] oxyCODONE-acetaminophen (PERCOCET) 10-325 MG tablet    [DISCONTINUED] zolpidem (AMBIEN) 10 MG tablet Take 1 tablet by mouth at bedtime as needed for sleep.   No Known Allergies No results found for this or any previous visit (from the past 2160 hour(s)). Objective  Body mass index is 17.51 kg/m. Wt Readings from Last 3 Encounters:  12/08/21 118 lb 9.6 oz (53.8 kg)  09/27/21 125 lb 4.8 oz (56.8 kg)  09/13/21 123 lb 6.4 oz (56 kg)   Temp Readings from Last 3 Encounters:  12/08/21 98.2 F (36.8 C) (Oral)  09/27/21 (!) 97.2 F (36.2 C)  09/13/21 97.8 F (36.6 C)   BP Readings from Last 3 Encounters:  12/08/21 98/62  09/27/21 124/85  09/13/21 116/87   Pulse Readings from Last 3 Encounters:  12/08/21 70  09/27/21 62  09/13/21 65    Physical Exam Vitals and nursing note reviewed.  Constitutional:      Appearance: Normal appearance. He is well-developed and  well-groomed.  HENT:     Head: Normocephalic and atraumatic.  Eyes:     Conjunctiva/sclera: Conjunctivae normal.     Pupils: Pupils are equal, round, and reactive to light.  Cardiovascular:     Rate and Rhythm: Normal rate and regular rhythm.     Heart sounds: Normal heart sounds.  Pulmonary:     Effort: Pulmonary effort is normal. No respiratory distress.     Breath sounds: Normal breath sounds.  Abdominal:     Tenderness: There is no abdominal tenderness.  Skin:    General: Skin is warm and moist.  Neurological:     General: No focal deficit present.     Mental Status: He is alert and oriented to person, place, and time. Mental status is at baseline.     Sensory: Sensation is intact.     Motor: Motor function is intact.     Coordination: Coordination is intact.     Gait: Gait is intact. Gait normal.  Psychiatric:        Attention and Perception: Attention and perception normal.        Mood and Affect: Mood and affect normal.        Speech: Speech normal.        Behavior: Behavior normal. Behavior is cooperative.        Thought Content: Thought content normal.        Cognition and Memory: Cognition and memory normal.        Judgment: Judgment normal.     Assessment  Plan  Coronary artery disease of native artery of native heart with stable angina pectoris (HCC) Pad/Cad/htn/AICD with hypotension today it could be due to new pain meds will disc with cardiology at [facility] cardiology Dr. Dierdre Highman if they want to change any medications on lipitor  80, plavix 75, lasix 20 mg qd, imdur 60 mg qd toprol 25 xl qd  Echo 04/22/2018  Study Conclusions  - Left ventricle: Systolic function was normal. The estimated    ejection fraction was in the range of 50% to 55%.   Chronic low back pain, unspecified back pain laterality, unspecified whether sciatica present Chronic, continuous use of opioids   Hm Declines flu shot , shingrix, pna shots Had 3/3 doses hep B check titer in future  consider  Will disc Tdap in the future moderna 3/3 disc booster    PSA psa normal 10/2020 ordered repeat PSA Hep C negative  -h/o hematuria CT 08/2017 with left kidney 6 mm stone could be etiology and CT 04/2019 with b/l nonobstructing kidney stones on CT L spine HIV neg 03/29/15   Colonoscopy 3 and 07/2017 had poor prep diverticulosis -f/u GI 03/05/18 will ask if will order CT liver protocol for liver lesion  -of note will [facility] never had CT ab/pelvis sch 11 or 03/2018  -per pts wife [facility] cardiology does not want further procedures for now and especially without their approval previously and as of 02/26/19 no $ to f/u GI    Eye glasses [facility] in [place] cataracts b/l eyes glasses help seen in 2020/2021 Referred [facility] c/w cataracts and reduced vision  As of 09/2021 had b/l cataracts surgery and vision sensitive to the sun    [facility]-Dr. Maurine Cane [facility]  Urology Dr. Richardo Hanks Pain clinic-preferred pain clinic   Provider: Dr. French Ana McLean-Scocuzza-Internal Medicine

## 2021-12-14 ENCOUNTER — Other Ambulatory Visit (INDEPENDENT_AMBULATORY_CARE_PROVIDER_SITE_OTHER): Payer: Medicare Other

## 2021-12-14 DIAGNOSIS — R7303 Prediabetes: Secondary | ICD-10-CM | POA: Diagnosis not present

## 2021-12-14 DIAGNOSIS — I10 Essential (primary) hypertension: Secondary | ICD-10-CM | POA: Diagnosis not present

## 2021-12-14 DIAGNOSIS — N400 Enlarged prostate without lower urinary tract symptoms: Secondary | ICD-10-CM

## 2021-12-14 DIAGNOSIS — Z1329 Encounter for screening for other suspected endocrine disorder: Secondary | ICD-10-CM

## 2021-12-14 LAB — COMPREHENSIVE METABOLIC PANEL
ALT: 16 U/L (ref 0–53)
AST: 21 U/L (ref 0–37)
Albumin: 3.8 g/dL (ref 3.5–5.2)
Alkaline Phosphatase: 44 U/L (ref 39–117)
BUN: 17 mg/dL (ref 6–23)
CO2: 27 mEq/L (ref 19–32)
Calcium: 9.1 mg/dL (ref 8.4–10.5)
Chloride: 103 mEq/L (ref 96–112)
Creatinine, Ser: 0.93 mg/dL (ref 0.40–1.50)
GFR: 85.3 mL/min (ref >60.00)
Glucose, Bld: 143 mg/dL — ABNORMAL HIGH (ref 70–99)
Potassium: 3.9 mEq/L (ref 3.5–5.1)
Sodium: 139 mEq/L (ref 135–145)
Total Bilirubin: 0.2 mg/dL (ref 0.2–1.2)
Total Protein: 6.4 g/dL (ref 6.0–8.3)

## 2021-12-14 LAB — CBC WITH DIFFERENTIAL/PLATELET
Basophils Absolute: 0 10*3/uL (ref 0.0–0.1)
Basophils Relative: 0.9 % (ref 0.0–3.0)
Eosinophils Absolute: 0.1 10*3/uL (ref 0.0–0.7)
Eosinophils Relative: 2.7 % (ref 0.0–5.0)
HCT: 38 % — ABNORMAL LOW (ref 39.0–52.0)
Hemoglobin: 12.3 g/dL — ABNORMAL LOW (ref 13.0–17.0)
Lymphocytes Relative: 42.7 % (ref 12.0–46.0)
Lymphs Abs: 2 10*3/uL (ref 0.7–4.0)
MCHC: 32.4 g/dL (ref 30.0–36.0)
MCV: 92.3 fl (ref 78.0–100.0)
Monocytes Absolute: 0.7 10*3/uL (ref 0.1–1.0)
Monocytes Relative: 15.4 % — ABNORMAL HIGH (ref 3.0–12.0)
Neutro Abs: 1.8 10*3/uL (ref 1.4–7.7)
Neutrophils Relative %: 38.3 % — ABNORMAL LOW (ref 43.0–77.0)
Platelets: 269 10*3/uL (ref 150.0–400.0)
RBC: 4.12 Mil/uL — ABNORMAL LOW (ref 4.22–5.81)
RDW: 17.7 % — ABNORMAL HIGH (ref 11.5–15.5)
WBC: 4.7 10*3/uL (ref 4.0–10.5)

## 2021-12-14 LAB — LIPID PANEL
Cholesterol: 97 mg/dL (ref 0–200)
HDL: 39.4 mg/dL (ref >39.00)
LDL Cholesterol: 41 mg/dL (ref 0–99)
NonHDL: 57.23
Total CHOL/HDL Ratio: 2
Triglycerides: 79 mg/dL (ref 0.0–149.0)
VLDL: 15.8 mg/dL (ref 0.0–40.0)

## 2021-12-14 LAB — PSA: PSA: 2.58 ng/mL (ref 0.10–4.00)

## 2021-12-14 LAB — HEMOGLOBIN A1C: Hgb A1c MFr Bld: 6.9 % — ABNORMAL HIGH (ref 4.6–6.5)

## 2021-12-14 LAB — TSH: TSH: 1.89 u[IU]/mL (ref 0.35–5.50)

## 2021-12-26 DIAGNOSIS — M5116 Intervertebral disc disorders with radiculopathy, lumbar region: Secondary | ICD-10-CM | POA: Diagnosis not present

## 2021-12-30 ENCOUNTER — Encounter: Payer: Self-pay | Admitting: Internal Medicine

## 2021-12-30 DIAGNOSIS — I700 Atherosclerosis of aorta: Secondary | ICD-10-CM | POA: Insufficient documentation

## 2022-01-18 DIAGNOSIS — M25512 Pain in left shoulder: Secondary | ICD-10-CM | POA: Diagnosis not present

## 2022-02-09 ENCOUNTER — Other Ambulatory Visit: Payer: Self-pay

## 2022-02-09 ENCOUNTER — Emergency Department: Payer: Medicare Other

## 2022-02-09 ENCOUNTER — Inpatient Hospital Stay
Admission: EM | Admit: 2022-02-09 | Discharge: 2022-02-12 | DRG: 378 | Discharge status: Against Medical Advice | Payer: Medicare Other | Attending: Internal Medicine | Admitting: Internal Medicine

## 2022-02-09 DIAGNOSIS — I700 Atherosclerosis of aorta: Secondary | ICD-10-CM | POA: Diagnosis not present

## 2022-02-09 DIAGNOSIS — K259 Gastric ulcer, unspecified as acute or chronic, without hemorrhage or perforation: Secondary | ICD-10-CM

## 2022-02-09 DIAGNOSIS — F419 Anxiety disorder, unspecified: Secondary | ICD-10-CM | POA: Diagnosis present

## 2022-02-09 DIAGNOSIS — Z7982 Long term (current) use of aspirin: Secondary | ICD-10-CM

## 2022-02-09 DIAGNOSIS — R079 Chest pain, unspecified: Secondary | ICD-10-CM | POA: Diagnosis present

## 2022-02-09 DIAGNOSIS — N401 Enlarged prostate with lower urinary tract symptoms: Secondary | ICD-10-CM | POA: Diagnosis present

## 2022-02-09 DIAGNOSIS — J449 Chronic obstructive pulmonary disease, unspecified: Secondary | ICD-10-CM | POA: Diagnosis not present

## 2022-02-09 DIAGNOSIS — I252 Old myocardial infarction: Secondary | ICD-10-CM

## 2022-02-09 DIAGNOSIS — I714 Abdominal aortic aneurysm, without rupture, unspecified: Secondary | ICD-10-CM | POA: Diagnosis not present

## 2022-02-09 DIAGNOSIS — R0789 Other chest pain: Secondary | ICD-10-CM | POA: Diagnosis not present

## 2022-02-09 DIAGNOSIS — K257 Chronic gastric ulcer without hemorrhage or perforation: Secondary | ICD-10-CM | POA: Diagnosis present

## 2022-02-09 DIAGNOSIS — I255 Ischemic cardiomyopathy: Secondary | ICD-10-CM | POA: Diagnosis not present

## 2022-02-09 DIAGNOSIS — G629 Polyneuropathy, unspecified: Secondary | ICD-10-CM | POA: Diagnosis present

## 2022-02-09 DIAGNOSIS — K31819 Angiodysplasia of stomach and duodenum without bleeding: Secondary | ICD-10-CM

## 2022-02-09 DIAGNOSIS — I739 Peripheral vascular disease, unspecified: Secondary | ICD-10-CM | POA: Diagnosis not present

## 2022-02-09 DIAGNOSIS — R0902 Hypoxemia: Secondary | ICD-10-CM | POA: Diagnosis not present

## 2022-02-09 DIAGNOSIS — I959 Hypotension, unspecified: Secondary | ICD-10-CM | POA: Diagnosis not present

## 2022-02-09 DIAGNOSIS — D500 Iron deficiency anemia secondary to blood loss (chronic): Secondary | ICD-10-CM | POA: Diagnosis present

## 2022-02-09 DIAGNOSIS — Z79899 Other long term (current) drug therapy: Secondary | ICD-10-CM

## 2022-02-09 DIAGNOSIS — M549 Dorsalgia, unspecified: Secondary | ICD-10-CM | POA: Diagnosis present

## 2022-02-09 DIAGNOSIS — G4700 Insomnia, unspecified: Secondary | ICD-10-CM | POA: Diagnosis not present

## 2022-02-09 DIAGNOSIS — R338 Other retention of urine: Secondary | ICD-10-CM | POA: Diagnosis present

## 2022-02-09 DIAGNOSIS — K922 Gastrointestinal hemorrhage, unspecified: Secondary | ICD-10-CM | POA: Diagnosis not present

## 2022-02-09 DIAGNOSIS — Z20822 Contact with and (suspected) exposure to covid-19: Secondary | ICD-10-CM | POA: Diagnosis present

## 2022-02-09 DIAGNOSIS — E785 Hyperlipidemia, unspecified: Secondary | ICD-10-CM | POA: Diagnosis not present

## 2022-02-09 DIAGNOSIS — I25118 Atherosclerotic heart disease of native coronary artery with other forms of angina pectoris: Secondary | ICD-10-CM | POA: Diagnosis present

## 2022-02-09 DIAGNOSIS — Z8249 Family history of ischemic heart disease and other diseases of the circulatory system: Secondary | ICD-10-CM

## 2022-02-09 DIAGNOSIS — K3189 Other diseases of stomach and duodenum: Secondary | ICD-10-CM | POA: Diagnosis not present

## 2022-02-09 DIAGNOSIS — D649 Anemia, unspecified: Secondary | ICD-10-CM | POA: Diagnosis present

## 2022-02-09 DIAGNOSIS — Z681 Body mass index (BMI) 19 or less, adult: Secondary | ICD-10-CM

## 2022-02-09 DIAGNOSIS — Z9842 Cataract extraction status, left eye: Secondary | ICD-10-CM

## 2022-02-09 DIAGNOSIS — K31811 Angiodysplasia of stomach and duodenum with bleeding: Secondary | ICD-10-CM | POA: Diagnosis not present

## 2022-02-09 DIAGNOSIS — Z95810 Presence of automatic (implantable) cardiac defibrillator: Secondary | ICD-10-CM | POA: Diagnosis not present

## 2022-02-09 DIAGNOSIS — Z8679 Personal history of other diseases of the circulatory system: Secondary | ICD-10-CM

## 2022-02-09 DIAGNOSIS — Z720 Tobacco use: Secondary | ICD-10-CM | POA: Diagnosis present

## 2022-02-09 DIAGNOSIS — I7090 Unspecified atherosclerosis: Secondary | ICD-10-CM | POA: Diagnosis present

## 2022-02-09 DIAGNOSIS — I10 Essential (primary) hypertension: Secondary | ICD-10-CM | POA: Diagnosis present

## 2022-02-09 DIAGNOSIS — K5730 Diverticulosis of large intestine without perforation or abscess without bleeding: Secondary | ICD-10-CM | POA: Diagnosis present

## 2022-02-09 DIAGNOSIS — Z743 Need for continuous supervision: Secondary | ICD-10-CM | POA: Diagnosis not present

## 2022-02-09 DIAGNOSIS — R0602 Shortness of breath: Secondary | ICD-10-CM | POA: Diagnosis not present

## 2022-02-09 DIAGNOSIS — K219 Gastro-esophageal reflux disease without esophagitis: Secondary | ICD-10-CM | POA: Diagnosis present

## 2022-02-09 DIAGNOSIS — F32A Depression, unspecified: Secondary | ICD-10-CM | POA: Diagnosis not present

## 2022-02-09 DIAGNOSIS — I493 Ventricular premature depolarization: Secondary | ICD-10-CM | POA: Diagnosis present

## 2022-02-09 DIAGNOSIS — R6889 Other general symptoms and signs: Secondary | ICD-10-CM | POA: Diagnosis not present

## 2022-02-09 DIAGNOSIS — I509 Heart failure, unspecified: Secondary | ICD-10-CM | POA: Diagnosis not present

## 2022-02-09 DIAGNOSIS — Z7902 Long term (current) use of antithrombotics/antiplatelets: Secondary | ICD-10-CM

## 2022-02-09 DIAGNOSIS — R634 Abnormal weight loss: Secondary | ICD-10-CM | POA: Diagnosis present

## 2022-02-09 DIAGNOSIS — R531 Weakness: Secondary | ICD-10-CM | POA: Diagnosis not present

## 2022-02-09 DIAGNOSIS — G8929 Other chronic pain: Secondary | ICD-10-CM | POA: Diagnosis present

## 2022-02-09 DIAGNOSIS — Z961 Presence of intraocular lens: Secondary | ICD-10-CM | POA: Diagnosis present

## 2022-02-09 DIAGNOSIS — R636 Underweight: Secondary | ICD-10-CM | POA: Diagnosis present

## 2022-02-09 DIAGNOSIS — Z9841 Cataract extraction status, right eye: Secondary | ICD-10-CM

## 2022-02-09 DIAGNOSIS — F17210 Nicotine dependence, cigarettes, uncomplicated: Secondary | ICD-10-CM | POA: Diagnosis present

## 2022-02-09 DIAGNOSIS — I110 Hypertensive heart disease with heart failure: Secondary | ICD-10-CM | POA: Diagnosis present

## 2022-02-09 DIAGNOSIS — Z955 Presence of coronary angioplasty implant and graft: Secondary | ICD-10-CM

## 2022-02-09 DIAGNOSIS — R55 Syncope and collapse: Secondary | ICD-10-CM

## 2022-02-09 HISTORY — DX: Anemia, unspecified: D64.9

## 2022-02-09 HISTORY — DX: Chest pain, unspecified: R07.9

## 2022-02-09 LAB — COMPREHENSIVE METABOLIC PANEL
ALT: 7 U/L (ref 0–44)
AST: 19 U/L (ref 15–41)
Albumin: 2.4 g/dL — ABNORMAL LOW (ref 3.5–5.0)
Alkaline Phosphatase: 27 U/L — ABNORMAL LOW (ref 38–126)
Anion gap: 8 (ref 5–15)
BUN: 11 mg/dL (ref 8–23)
CO2: 20 mmol/L — ABNORMAL LOW (ref 22–32)
Calcium: 7.3 mg/dL — ABNORMAL LOW (ref 8.9–10.3)
Chloride: 110 mmol/L (ref 98–111)
Creatinine, Ser: 0.42 mg/dL — ABNORMAL LOW (ref 0.61–1.24)
GFR, Estimated: >60 mL/min (ref >60)
Glucose, Bld: 76 mg/dL (ref 70–99)
Potassium: 3.6 mmol/L (ref 3.5–5.1)
Sodium: 138 mmol/L (ref 135–145)
Total Bilirubin: 0.3 mg/dL (ref 0.3–1.2)
Total Protein: 4.4 g/dL — ABNORMAL LOW (ref 6.5–8.1)

## 2022-02-09 LAB — CBC
HCT: 26.3 % — ABNORMAL LOW (ref 39.0–52.0)
HCT: 26.7 % — ABNORMAL LOW (ref 39.0–52.0)
HCT: 27.2 % — ABNORMAL LOW (ref 39.0–52.0)
Hemoglobin: 8.3 g/dL — ABNORMAL LOW (ref 13.0–17.0)
Hemoglobin: 8.4 g/dL — ABNORMAL LOW (ref 13.0–17.0)
Hemoglobin: 8.6 g/dL — ABNORMAL LOW (ref 13.0–17.0)
MCH: 28.2 pg (ref 26.0–34.0)
MCH: 28.2 pg (ref 26.0–34.0)
MCH: 28 pg (ref 26.0–34.0)
MCHC: 31.5 g/dL (ref 30.0–36.0)
MCHC: 31.6 g/dL (ref 30.0–36.0)
MCHC: 31.6 g/dL (ref 30.0–36.0)
MCV: 89.5 fL (ref 80.0–100.0)
MCV: 89.6 fL (ref 80.0–100.0)
MCV: 88.6 fL (ref 80.0–100.0)
Platelets: 220 10*3/uL (ref 150–400)
Platelets: 231 10*3/uL (ref 150–400)
Platelets: 234 10*3/uL (ref 150–400)
RBC: 2.94 MIL/uL — ABNORMAL LOW (ref 4.22–5.81)
RBC: 2.98 MIL/uL — ABNORMAL LOW (ref 4.22–5.81)
RBC: 3.07 MIL/uL — ABNORMAL LOW (ref 4.22–5.81)
RDW: 16.9 % — ABNORMAL HIGH (ref 11.5–15.5)
RDW: 17 % — ABNORMAL HIGH (ref 11.5–15.5)
RDW: 16.9 % — ABNORMAL HIGH (ref 11.5–15.5)
WBC: 5 10*3/uL (ref 4.0–10.5)
WBC: 7.9 10*3/uL (ref 4.0–10.5)
WBC: 7.1 10*3/uL (ref 4.0–10.5)
nRBC: 0 % (ref 0.0–0.2)
nRBC: 0 % (ref 0.0–0.2)
nRBC: 0 % (ref 0.0–0.2)

## 2022-02-09 LAB — RESP PANEL BY RT-PCR (FLU A&B, COVID) ARPGX2
Influenza A by PCR: NEGATIVE
Influenza B by PCR: NEGATIVE
SARS Coronavirus 2 by RT PCR: NEGATIVE

## 2022-02-09 LAB — TROPONIN I (HIGH SENSITIVITY)
Troponin I (High Sensitivity): 9 ng/L (ref <18)
Troponin I (High Sensitivity): 9 ng/L (ref <18)
Troponin I (High Sensitivity): 13 ng/L (ref <18)

## 2022-02-09 MED ORDER — ONDANSETRON HCL 4 MG/2ML IJ SOLN: 4.0000 mg | Freq: Four times a day (QID) | INTRAMUSCULAR | Status: DC | PRN

## 2022-02-09 MED ORDER — ACETAMINOPHEN 325 MG PO TABS
650.0000 mg | ORAL_TABLET | Freq: Three times a day (TID) | ORAL | Status: DC
  Administered 2022-02-09 – 2022-02-12 (×8): 650 mg via ORAL
  Filled 2022-02-09 (×9): qty 2

## 2022-02-09 MED ORDER — ONDANSETRON HCL 4 MG PO TABS: 4.0000 mg | ORAL_TABLET | Freq: Four times a day (QID) | ORAL | Status: DC | PRN

## 2022-02-09 MED ORDER — NITROGLYCERIN 0.4 MG SL SUBL: 0.4000 mg | SUBLINGUAL_TABLET | SUBLINGUAL | Status: DC | PRN

## 2022-02-09 MED ORDER — PANTOPRAZOLE INFUSION (NEW) - SIMPLE MED
8.0000 mg/h | INTRAVENOUS | Status: AC
  Administered 2022-02-09 – 2022-02-12 (×6): 8 mg/h via INTRAVENOUS
  Filled 2022-02-09 (×7): qty 100

## 2022-02-09 MED ORDER — PANTOPRAZOLE 80MG IVPB - SIMPLE MED
80.0000 mg | Freq: Once | INTRAVENOUS | Status: AC
  Administered 2022-02-09: 80 mg via INTRAVENOUS
  Filled 2022-02-09: qty 100

## 2022-02-09 MED ORDER — ACETAMINOPHEN 650 MG RE SUPP: 650.0000 mg | Freq: Four times a day (QID) | RECTAL | Status: DC | PRN

## 2022-02-09 MED ORDER — LISINOPRIL 5 MG PO TABS
2.5000 mg | ORAL_TABLET | Freq: Every day | ORAL | Status: DC
  Administered 2022-02-10 – 2022-02-11 (×2): 2.5 mg via ORAL
  Filled 2022-02-09 (×3): qty 1

## 2022-02-09 MED ORDER — MIRTAZAPINE 15 MG PO TABS
30.0000 mg | ORAL_TABLET | Freq: Every day | ORAL | Status: DC
  Administered 2022-02-09 – 2022-02-11 (×3): 30 mg via ORAL
  Filled 2022-02-09 (×3): qty 2

## 2022-02-09 MED ORDER — METOPROLOL SUCCINATE ER 25 MG PO TB24
25.0000 mg | ORAL_TABLET | Freq: Every day | ORAL | Status: DC
  Administered 2022-02-10 – 2022-02-11 (×2): 25 mg via ORAL
  Filled 2022-02-09 (×3): qty 1

## 2022-02-09 MED ORDER — NICOTINE 14 MG/24HR TD PT24: 14.0000 mg | MEDICATED_PATCH | Freq: Every day | TRANSDERMAL | Status: DC | PRN

## 2022-02-09 MED ORDER — PANTOPRAZOLE SODIUM 40 MG IV SOLR: 40.0000 mg | Freq: Two times a day (BID) | INTRAVENOUS | Status: DC

## 2022-02-09 MED ORDER — IOHEXOL 350 MG/ML SOLN
100.0000 mL | Freq: Once | INTRAVENOUS | Status: AC | PRN
  Administered 2022-02-09: 100 mL via INTRAVENOUS

## 2022-02-09 MED ORDER — ACETAMINOPHEN 325 MG PO TABS: 650.0000 mg | ORAL_TABLET | Freq: Four times a day (QID) | ORAL | Status: DC | PRN

## 2022-02-09 MED ORDER — ATORVASTATIN CALCIUM 80 MG PO TABS
80.0000 mg | ORAL_TABLET | Freq: Every day | ORAL | Status: DC
  Administered 2022-02-09 – 2022-02-12 (×4): 80 mg via ORAL
  Filled 2022-02-09 (×3): qty 1
  Filled 2022-02-09: qty 4

## 2022-02-09 MED ORDER — OXYCODONE HCL 5 MG PO TABS
10.0000 mg | ORAL_TABLET | Freq: Three times a day (TID) | ORAL | Status: DC | PRN
  Administered 2022-02-09 – 2022-02-12 (×8): 10 mg via ORAL
  Filled 2022-02-09 (×8): qty 2

## 2022-02-09 MED ORDER — LACTATED RINGERS IV BOLUS
1000.0000 mL | Freq: Once | INTRAVENOUS | Status: AC
  Administered 2022-02-09: 1000 mL via INTRAVENOUS

## 2022-02-09 MED ORDER — VITAMIN B-6 100 MG PO TABS
100.0000 mg | ORAL_TABLET | Freq: Every day | ORAL | Status: DC
  Administered 2022-02-10 – 2022-02-11 (×2): 100 mg via ORAL
  Filled 2022-02-09 (×3): qty 1

## 2022-02-09 MED ORDER — OXYCODONE-ACETAMINOPHEN 10-325 MG PO TABS: 1.0000 | ORAL_TABLET | Freq: Three times a day (TID) | ORAL | Status: DC | PRN

## 2022-02-09 MED ORDER — SENNOSIDES-DOCUSATE SODIUM 8.6-50 MG PO TABS: 1.0000 | ORAL_TABLET | Freq: Every evening | ORAL | Status: DC | PRN

## 2022-02-09 MED ORDER — IPRATROPIUM BROMIDE 0.02 % IN SOLN
0.2500 mg | Freq: Four times a day (QID) | RESPIRATORY_TRACT | Status: DC | PRN
  Administered 2022-02-10 – 2022-02-12 (×3): 0.25 mg via RESPIRATORY_TRACT
  Filled 2022-02-09 (×3): qty 2.5

## 2022-02-09 NOTE — ED Notes (Signed)
X-ray at bedside

## 2022-02-09 NOTE — Assessment & Plan Note (Signed)
-   Per med reconciliation patient is currently smoking cigarettes - Plavix has been taking today at home prior to ED presentation - Currently holding at this time

## 2022-02-09 NOTE — Hospital Course (Addendum)
[name] is a 66 year old male with history of hypertension, CAD, multiple MIs, AICD, AAA, ischemic cardiomyopathy, PVD with claudication, current tobacco use, hyperlipidemia, GERD, neuropathy, insomnia, who presents to the emergency department for chief concerns of shortness of breath [place].  Patient [place] the post office getting a money order when he developed worsening weakness and shortness of breath.    Initial vitals in the emergency department showed temperature of 97.5, respiration rate of 18, heart rate of 66, blood pressure 87/62, SPO2 of 95% on room air.  Serum sodium is 138, potassium 3.6, chloride of 110, bicarb 20, BUN of 11, serum creatinine of 0.42, GFR greater than 60, nonfasting blood glucose 76, WBC 5.0, hemoglobin 8.4, platelets of 231.  COVID/influenza A/influenza B PCR were negative.  High-sensitivity troponin was negative x2.  ED treatment: LR 1 L bolus.

## 2022-02-09 NOTE — ED Triage Notes (Signed)
Pt [place] via EMS [place] post office after he was not feeling well- pt has been having weakness and generally not feeling well for the past few days/ a week- pt bp on arrival was 70/40- pt has received 1L NS and 511ml of LR- pt took 2 nitro prior to arrival for CP in his L chest radiating into his L shoulder

## 2022-02-09 NOTE — Assessment & Plan Note (Signed)
-   Atorvastatin 80 mg daily resumed 

## 2022-02-09 NOTE — Assessment & Plan Note (Addendum)
In a patient who takes [facility] powder daily for headaches and has negative high sensitive troponin x2 on admission with negative ischemic changes on EKG - Etiology work-up in progress at this time - CBC showed hemoglobin of 8.4, baseline hemoglobin is 12.3-13.6 over the last year - Repeat CBC pending - Protonix bolus and gtt. initiated - Gastroenterology has been consulted, Dr. Marius Ditch who states the patient will be seen - Admit telemetry cardiac, observation

## 2022-02-09 NOTE — H&P (Signed)
History and Physical   Martin Hernandez [employer]. ZOX:[id number] DOB: 1955/09/03 DOA: 02/09/2022  PCP: [facility], [facility], MD  Patient coming from: Post office via EMS  I have personally briefly reviewed patient's old medical records in [facility] EMR.  Chief Concern: Shortness of breath  HPI: [name] is a 66 year old male with history of hypertension, CAD, multiple MIs, AICD, AAA, ischemic cardiomyopathy, PVD with claudication, current tobacco use, hyperlipidemia, GERD, neuropathy, insomnia, who presents to the emergency department for chief concerns of shortness of breath and hypotension [place].  Patient was at the post office getting a money order when he developed worsening weakness and shortness of breath.    Initial vitals in the emergency department showed temperature of 97.5, respiration rate of 18, heart rate of 66, blood pressure 87/62, SPO2 of 95% on room air.  Serum sodium is 138, potassium 3.6, chloride of 110, bicarb 20, BUN of 11, serum creatinine of 0.42, GFR greater than 60, nonfasting blood glucose 76, WBC 5.0, hemoglobin 8.4, platelets of 231.  COVID/influenza A/influenza B PCR were negative.  High-sensitivity troponin was negative x2.  ED treatment: LR 1 L bolus. --------------------------- At bedside, he is able to tell me his name, age, current location of hospital. The current year.   He reports that he has been short of breath for the last 2 to 3 days.  He reports that shortness of breath he denies any known sick contacts and changes to cough, chills.  He denies fever.  He denies trauma to his person.  He unintentional lost 4 pounds in two months. He denies fever, new cough, chest pain. He endorses shortness of breat with exertion.   He takes [facility] powder daily for headaches.  Social history: He lives at home with his wife. He is current tobacco user, smoking 1/2 ppd and he started smoking at age 59. He denies etoh and  recreational drug use. He is formerly a Curator.  ROS: Constitutional: + weight change, no fever ENT/Mouth: no sore throat, no rhinorrhea Eyes: no eye pain, no vision changes Cardiovascular: no chest pain, + dyspnea,  no edema, no palpitations Respiratory: no cough, no sputum, no wheezing Gastrointestinal: no nausea, no vomiting, no diarrhea, no constipation Genitourinary: no urinary incontinence, no dysuria, no hematuria Musculoskeletal: no arthralgias, no myalgias Skin: no skin lesions, no pruritus, Neuro: + weakness, no loss of consciousness, no syncope Psych: no anxiety, no depression, + decrease appetite Heme/Lymph: no bruising, no bleeding  ED Course: Discussed with emergency medicine provider, patient requiring hospitalization for chief concerns of symptomatic anemia.  Assessment/Plan  Principal Problem:   Symptomatic anemia Active Problems:   AAA (abdominal aortic aneurysm) (HCC)   HLD (hyperlipidemia)   Cardiomyopathy, ischemic   PAD (peripheral artery disease) (HCC)   Current tobacco use   Weight loss   Essential hypertension   Anxiety and depression   Benign prostatic hyperplasia with urinary retention   Atherosclerosis   Chest pain   Assessment and Plan:  * Symptomatic anemia In a patient who takes BC powder daily for headaches and has negative high sensitive troponin x2 on admission with negative ischemic changes on EKG - Etiology work-up in progress at this time - CBC showed hemoglobin of 8.4, baseline hemoglobin is 12.3-13.6 over the last year - Repeat CBC pending - Protonix bolus and gtt. initiated - Gastroenterology has been consulted, Dr. Allegra Lai who states the patient will be seen - Admit telemetry cardiac, observation  Essential hypertension -  Lisinopril 2.5 mg daily and metoprolol succinate 25 mg daily resumed for 02/10/2022 - Imdur 60 mg daily 3 times daily not resumed on admission due to hypotension - AM team to resume when appropriate  Weight  loss - Mirtazapine 30 mg nightly resumed - Recommend patient to continue follow-up with outpatient PCP for close monitoring  Current tobacco use - As needed nicotine patch ordered  PAD (peripheral artery disease) (HCC) - Per med reconciliation patient is currently smoking cigarettes - Plavix has been taking today at home prior to ED presentation - Currently holding at this time  Cardiomyopathy, ischemic - Atorvastatin 80 mg daily, lisinopril 2.5 mg daily resumed - Nitroglycerin 0.4 mg sublingual every 5 minutes.  For chest pain ordered - Holding home Plavix 75 mg daily, aspirin 81 mg daily due to concerns of symptomatic anemia secondary to upper GI bleed  HLD (hyperlipidemia) - Atorvastatin 80 mg daily resumed  Chart reviewed.   DVT prophylaxis: TED hose Code Status: Full code Diet: Liquids Family Communication: Daughter, Glee ArvinLaToya was updated at bedside with patient's permission Disposition Plan: Pending clinical course Consults called: Gastroenterology Admission status: Telemetry cardiac, observation  Past Medical History:  Diagnosis Date   Allergy    CAD (coronary artery disease)    Depression    Frequent headaches    GERD (gastroesophageal reflux disease)    Headache    Hyperlipidemia    Hypertension    PAD (peripheral artery disease) (HCC)    Tobacco abuse    Past Surgical History:  Procedure Laterality Date   CATARACT EXTRACTION W/PHACO Right 09/13/2021   Procedure: CATARACT EXTRACTION PHACO AND INTRAOCULAR LENS PLACEMENT (IOC) RIGHT;  Surgeon: Galen ManilaPorfilio, William, MD;  Location: [facility];  Service: Ophthalmology;  Laterality: Right;  3.53 0:43.1   CATARACT EXTRACTION W/PHACO Left 09/27/2021   Procedure: CATARACT EXTRACTION PHACO AND INTRAOCULAR LENS PLACEMENT (IOC) LEFT 4.79 00:25.3;  Surgeon: Galen ManilaPorfilio, William, MD;  [facility];  Service: Ophthalmology;  Laterality: Left;   COLONOSCOPY WITH PROPOFOL N/A 06/20/2017   Procedure: COLONOSCOPY  WITH PROPOFOL;  Surgeon: Wyline MoodAnna, Kiran, MD;  [facility];  Service: Gastroenterology;  Laterality: N/A;   COLONOSCOPY WITH PROPOFOL N/A 07/24/2017   Procedure: COLONOSCOPY WITH PROPOFOL;  Surgeon: Wyline MoodAnna, Kiran, MD;  [facility];  Service: Gastroenterology;  Laterality: N/A;   ESOPHAGOGASTRODUODENOSCOPY (EGD) WITH PROPOFOL N/A 06/20/2017   Procedure: ESOPHAGOGASTRODUODENOSCOPY (EGD) WITH PROPOFOL;  Surgeon: Wyline MoodAnna, Kiran, MD;  [facility];  Service: Gastroenterology;  Laterality: N/A;   PERCUTANEOUS CORONARY STENT INTERVENTION (PCI-S)     STOMACH SURGERY     2009 [facility] per chart review pt had AAA repair    TONSILLECTOMY     Social History:  reports that he has been smoking cigarettes. He has a 12.50 pack-year smoking history. He has been exposed to tobacco smoke. He has never used smokeless tobacco. He reports that he does not drink alcohol and does not use drugs.  No Known Allergies Family History  Problem Relation Age of Onset   Diabetes Mother    Heart disease Mother    Hypertension Mother    Stroke Mother    Prostate cancer Father    Cancer Father        prostate cancer in 4550s   Diabetes Sister    Heart disease Sister    Ulcers Other        unknown who had ulcers in family    Family history: Family history reviewed and not pertinent  Prior to Admission  medications   Medication Sig Start Date End Date Taking? Authorizing Provider  alfuzosin (UROXATRAL) 10 MG 24 hr tablet Take 1 tablet (10 mg total) by mouth daily with breakfast. Dc flomax 0.4 10/05/21  Yes McLean-Scocuzza, Pasty Spillers, MD  aspirin 81 MG tablet Take 81 mg by mouth daily.   Yes [provider]  atorvastatin (LIPITOR) 80 MG tablet Take 1 tablet (80 mg total) by mouth daily at 6pm. 06/07/21  Yes McLean-Scocuzza, Pasty Spillers, MD  Baclofen 5 MG TABS Take 1 tablet by mouth twice daily. 05/04/21  Yes McLean-Scocuzza, Pasty Spillers, MD  clopidogrel (PLAVIX) 75 MG tablet 1 pill daily 03/19/19  Yes  McLean-Scocuzza, Pasty Spillers, MD  furosemide (LASIX) 20 MG tablet Take 20 mg by mouth daily. 02/13/20  Yes [provider]  gabapentin (NEURONTIN) 300 MG capsule Take 300 mg by mouth 3 (three) times daily. 06/03/21  Yes [provider]  isosorbide mononitrate (IMDUR) 60 MG 24 hr tablet Take 1 tablet by mouth in the morning, at noon, and at bedtime. 09/02/19  Yes [provider]  levocetirizine (XYZAL) 5 MG tablet Take 1 tablet by mouth every evening. 08/08/21  Yes McLean-Scocuzza, Pasty Spillers, MD  lisinopril (ZESTRIL) 2.5 MG tablet Take 2.5 mg by mouth daily.   Yes [provider]  metoprolol succinate (TOPROL-XL) 25 MG 24 hr tablet Take 1 tablet (25 mg total) by mouth daily. 10/05/21  Yes McLean-Scocuzza, Pasty Spillers, MD  mirtazapine (REMERON) 30 MG tablet Take 1 tablet by mouth nightly. 10/05/21  Yes McLean-Scocuzza, Pasty Spillers, MD  oxyCODONE-acetaminophen (PERCOCET) 10-325 MG tablet Take 1 tablet by mouth as needed. 01/25/22  Yes [provider]  pantoprazole (PROTONIX) 40 MG tablet Take 1 tablet by mouth daily 30 minutes before food. 10/05/21  Yes McLean-Scocuzza, Pasty Spillers, MD  pyridOXINE (VITAMIN B-6) 100 MG tablet Take 1 tablet (100 mg total) by mouth daily. 06/07/21  Yes McLean-Scocuzza, Pasty Spillers, MD  zolpidem (AMBIEN) 10 MG tablet Take 1 tablet (10 mg total) by mouth at bedtime as needed. for sleep 12/08/21  Yes McLean-Scocuzza, Pasty Spillers, MD  Buprenorphine HCl (BELBUCA) 600 MCG FILM Place inside cheek 2 (two) times daily. Patient not taking: Reported on 02/09/2022    [provider]  ipratropium (ATROVENT) 0.02 % nebulizer solution Inhale into the lungs. 12/07/14   [provider]  nitroGLYCERIN (NITROSTAT) 0.4 MG SL tablet Place 1 tablet (0.4 mg total) under the tongue every 5 (five) minutes as needed for chest pain. 10/04/18   McLean-Scocuzza, Pasty Spillers, MD  tamsulosin (FLOMAX) 0.4 MG CAPS capsule  09/28/21   [provider]  thiamine (VITAMIN B-1) 100  MG tablet Take 100 mg by mouth daily. Patient not taking: Reported on 02/09/2022    [provider]   Physical Exam: Vitals:   02/09/22 1503 02/09/22 1530 02/09/22 1600 02/09/22 1630  BP:  109/64 96/69 106/60  Pulse:  71 74 70  Resp:  15 15 12   Temp: 98 F (36.7 C)     TempSrc: Oral     SpO2:      Weight:      Height:       Constitutional: appears age-appropriate, frail, cachectic appearing, NAD, calm, comfortable Eyes: PERRL, lids and conjunctivae normal ENMT: Mucous membranes are moist. Posterior pharynx clear of any exudate or lesions. Age-appropriate dentition. Hearing appropriate Neck: normal, supple, no masses, no thyromegaly Respiratory: clear to auscultation bilaterally, no wheezing, no crackles. Normal respiratory effort. No accessory muscle use.  Cardiovascular: Regular  rate and rhythm, no murmurs / rubs / gallops. No extremity edema. 2+ pedal pulses. No carotid bruits.  Abdomen: no tenderness, no masses palpated, no hepatosplenomegaly. Bowel sounds positive.  Musculoskeletal: no clubbing / cyanosis. No joint deformity upper and lower extremities. Good ROM, no contractures, no atrophy. Normal muscle tone.  Skin: no rashes, lesions, ulcers. No induration Neurologic: Sensation intact. Strength 5/5 in all 4.  Psychiatric: Normal judgment and insight. Alert and oriented x 3. Normal mood.   EKG: independently reviewed, showing sinus rhythm with rate of 68, QTc 476  Chest x-ray on Admission: I personally reviewed and I agree with radiologist reading as below.  CT Angio Chest/Abd/Pel for Dissection W and/or Wo Contrast  Result Date: 02/09/2022 CLINICAL DATA:  66 year old male with altered mental status. Weakness, malaise. Chest pain radiating to the left shoulder. History of MI. EXAM: CT ANGIOGRAPHY CHEST, ABDOMEN AND PELVIS TECHNIQUE: Non-contrast CT of the chest was initially obtained. Multidetector CT imaging through the chest, abdomen and pelvis was performed using  the standard protocol during bolus administration of intravenous contrast. Multiplanar reconstructed images and MIPs were obtained and reviewed to evaluate the vascular anatomy. RADIATION DOSE REDUCTION: This exam was performed according to the departmental dose-optimization program which includes automated exposure control, adjustment of the mA and/or kV according to patient size and/or use of iterative reconstruction technique. CONTRAST:  193mL OMNIPAQUE IOHEXOL 350 MG/ML SOLN COMPARISON:  CT Chest, Abdomen, and Pelvis 08/16/2017. CT Abdomen and Pelvis 08/21/2021. FINDINGS: CTA CHEST FINDINGS Cardiovascular: Chronic left chest AICD. Cardiomegaly appears stable since 2019. No pericardial effusion. Calcified coronary artery atherosclerosis and/or stents. Calcified aortic atherosclerosis. Negative for aortic aneurysm or dissection. No visible pulmonary artery filling defect. Mediastinum/Nodes: Negative. No mediastinal mass or lymphadenopathy. Lungs/Pleura: Chronic centrilobular and paraseptal/bullous emphysema. Lung volumes are stable since 2019. Non dependent retained secretions along the left anterolateral wall of the trachea series 7, image 51, but major airways are patent. Dependent left greater than right lung base opacity has increased and is only partially enhancing. No definite pleural fluid. But elsewhere the lungs are stable since 2019. Musculoskeletal: Chronic left anterior 4th rib fracture redemonstrated. No acute osseous abnormality identified. Review of the MIP images confirms the above findings. CTA ABDOMEN AND PELVIS FINDINGS VASCULAR Aortoiliac calcified atherosclerosis. 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 Embolization coils there which also track into the left up to rater appears stable since the noncontrast CT this April. Other major arterial structures appear patent. No  dissection identified. Tortuous abdominal aorta but no discrete abdominal aortic aneurysm. Review of the MIP images confirms the above findings. NON-VASCULAR Hepatobiliary: Liver and gallbladder appear negative. Pancreas: Negative. Spleen: Negative. Adrenals/Urinary Tract: Stable kidneys and adrenal gland since 2019. Chronic left lower pole nephrolithiasis now measures up to 9 mm. No hydronephrosis. Unremarkable bladder. Stomach/Bowel: Stool ball in the rectum increased from prior exams. Upstream large bowel retained stool and redundant sigmoid. Chronic sigmoid diverticulosis is apparent, but there is limited bowel detail due to early contrast timing and lack of oral contrast. However, the descending colon appears abnormally thickened and indistinct such as on series 5, image 125. The wall thickening appears to extend at least to the proximal sigmoid. No pneumoperitoneum identified. No definite free fluid. Upstream transverse colon is difficult to delineate and probably completely decompressed through the midline. Right: At the hepatic flexure also appears indistinct. Cecum has a more normal appearance. No  dilated small bowel. Questionable visualization of the appendix. No pericecal inflammation identified. Lymphatic: No lymphadenopathy is evident. Reproductive: No acute finding. Other: Bulky but stable prostate and bilateral seminal vesicles. No convincing pelvic free fluid. Musculoskeletal: Chronic left femur ORIF. Chronic L4-L5 degeneration. No acute osseous abnormality identified. Review of the MIP images confirms the above findings. IMPRESSION: 1. Negative for aortic dissection or aneurysm. Positive for Aortic Atherosclerosis (ICD10-I70.0 and chronic left iliac artery stenting and coil embolization. 2. Suboptimal bowel detail with CTA technique but indistinct appearance of the large bowel suspicious for a widespread Acute Colitis. Underlying diverticulosis, rectal stool ball. No free air, free fluid, or bowel  obstruction. If the diagnosis is in doubt then repeat CT Abdomen and Pelvis with oral and IV contrast would be ideal. 3. Bullous Emphysema (ICD10-J43.9). Bilateral dependent lower lobe opacity, probably atelectasis but difficult to exclude infection. No significant pleural fluid. 4. Chronic Cardiomegaly, left nephrolithiasis. Electronically Signed   By: Odessa Fleming M.D.   On: 02/09/2022 14:28   CT Head Wo Contrast  Result Date: 02/09/2022 CLINICAL DATA:  Mental status change, unknown cause EXAM: CT HEAD WITHOUT CONTRAST TECHNIQUE: Contiguous axial images were obtained from the base of the skull through the vertex without intravenous contrast. RADIATION DOSE REDUCTION: This exam was performed according to the departmental dose-optimization program which includes automated exposure control, adjustment of the mA and/or kV according to patient size and/or use of iterative reconstruction technique. COMPARISON:  None Available. FINDINGS: Brain: No evidence of acute infarction, hemorrhage, hydrocephalus, extra-axial collection or mass lesion/mass effect. Vascular: No hyperdense vessel identified. Skull: No acute fracture. Sinuses/Orbits: Clear visualized sinuses. No acute orbital findings. Other: No mastoid effusions. IMPRESSION: No evidence of acute intracranial abnormality. Electronically Signed   By: Feliberto Harts M.D.   On: 02/09/2022 12:23   DG Chest Portable 1 View  Result Date: 02/09/2022 CLINICAL DATA:  Chest pain, weakness EXAM: PORTABLE CHEST 1 VIEW COMPARISON:  04/18/2018 FINDINGS: Lungs are hyperinflated as can be seen with COPD. Mild bilateral interstitial thickening. No focal consolidation. No pleural effusion or pneumothorax. Heart and mediastinal contours are unremarkable. No acute osseous abnormality. IMPRESSION: 1. Mild bilateral interstitial thickening which may reflect mild interstitial edema versus infection. 2. COPD. Electronically Signed   By: Elige Ko M.D.   On: 02/09/2022 11:23     Labs on Admission: I have personally reviewed following labs  CBC: Recent Labs  Lab 02/09/22 1122 02/09/22 1123 02/09/22 1631  WBC 7.9 5.0 7.1  HGB 8.3* 8.4* 8.6*  HCT 26.3* 26.7* 27.2*  MCV 89.5 89.6 88.6  PLT 220 231 234   Basic Metabolic Panel: Recent Labs  Lab 02/09/22 1122  NA 138  K 3.6  CL 110  CO2 20*  GLUCOSE 76  BUN 11  CREATININE 0.42*  CALCIUM 7.3*   GFR: Estimated Creatinine Clearance: 68.9 mL/min (A) (by C-G formula based on SCr of 0.42 mg/dL (L)).  Liver Function Tests: Recent Labs  Lab 02/09/22 1122  AST 19  ALT 7  ALKPHOS 27*  BILITOT 0.3  PROT 4.4*  ALBUMIN 2.4*   Urine analysis:    Component Value Date/Time   COLORURINE YELLOW 08/10/2021 0830   APPEARANCEUR CLEAR 08/10/2021 0830   APPEARANCEUR Clear 09/18/2019 1049   LABSPEC 1.020 08/10/2021 0830   PHURINE 5.5 08/10/2021 0830   GLUCOSEU NEGATIVE 08/10/2021 0830   GLUCOSEU 100 (A) 05/22/2017 0836   HGBUR NEGATIVE 08/10/2021 0830   BILIRUBINUR NEGATIVE 08/10/2021 0830   BILIRUBINUR Negative 09/18/2019 1049  KETONESUR NEGATIVE 08/10/2021 0830   PROTEINUR NEGATIVE 08/10/2021 0830   UROBILINOGEN 0.2 05/22/2017 0836   NITRITE NEGATIVE 08/10/2021 0830   LEUKOCYTESUR NEGATIVE 08/10/2021 0830   CRITICAL CARE Performed by: Dr. Sedalia Muta  Total critical care time: 35 minutes  Critical care time was exclusive of separately billable procedures and treating other patients.  Critical care was necessary to treat or prevent imminent or life-threatening deterioration.  Critical care was time spent personally by me on the following activities: development of treatment plan with patient and/or surrogate as well as nursing, discussions with consultants, evaluation of patient's response to treatment, examination of patient, obtaining history from patient or surrogate, ordering and performing treatments and interventions, ordering and review of laboratory studies, ordering and review of radiographic  studies, pulse oximetry and re-evaluation of patient's condition.  Dr. Sedalia Muta [facility]  If 7PM-7AM, please contact overnight-coverage provider If 7AM-7PM, please contact day coverage provider [email or web address]  02/09/2022, 5:10 PM

## 2022-02-09 NOTE — ED Notes (Signed)
Dr Joni Fears at bedside speaking with wife

## 2022-02-09 NOTE — Assessment & Plan Note (Signed)
-   Lisinopril 2.5 mg daily and metoprolol succinate 25 mg daily resumed for 02/10/2022 - Imdur 60 mg daily 3 times daily not resumed on admission due to hypotension - AM team to resume when appropriate

## 2022-02-09 NOTE — Assessment & Plan Note (Signed)
-   Atorvastatin 80 mg daily, lisinopril 2.5 mg daily resumed - Nitroglycerin 0.4 mg sublingual every 5 minutes.  For chest pain ordered - Holding home Plavix 75 mg daily, aspirin 81 mg daily due to concerns of symptomatic anemia secondary to upper GI bleed

## 2022-02-09 NOTE — Assessment & Plan Note (Signed)
-   As needed nicotine patch ordered ?

## 2022-02-09 NOTE — ED Provider Notes (Signed)
[facility] Provider Note    Event Date/Time   First MD Initiated Contact with Patient 02/09/22 1054     (approximate)   History   Chief Complaint: Chest Pain and Weakness   HPI  Nazim Kadlec Briscoe Daniello. is a 66 y.o. male with a history of PAD, heart failure, hypertension, CAD, GERD who complains of malaise starting yesterday, and then today while walking into a post office he had a sudden onset of central chest pain associated with diaphoresis and lightheadedness.  He felt like he was going to pass out.  He took 2 nitroglycerin, and EMS was called who found him to be hypotensive at 70/40.  They gave 1/2 L bolus.  On arrival to the ED patient states that his chest pain has resolved and he is feeling little bit better.  He does note also having some upper abdominal pain as well.  No black or bloody stool or hematemesis.  No trauma.  He does have a prior history of aortic aneurysm repair.     Physical Exam   Triage Vital Signs: ED Triage Vitals  Enc Vitals Group     BP 02/09/22 1100 (!) 87/62     Pulse Rate 02/09/22 1100 66     Resp 02/09/22 1100 18     Temp 02/09/22 1100 (!) 97.5 F (36.4 C)     Temp Source 02/09/22 1100 Oral     SpO2 02/09/22 1100 95 %     Weight 02/09/22 1056 120 lb (54.4 kg)     Height 02/09/22 1056 5\' 9"  (1.753 m)     Head Circumference --      Peak Flow --      Pain Score 02/09/22 1056 0     Pain Loc --      Pain Edu? --      Excl. in [place]? --     Most recent vital signs: Vitals:   02/09/22 1500 02/09/22 1503  BP: 104/61   Pulse: 71   Resp: 14   Temp:  98 F (36.7 C)  SpO2:      General: Awake, no distress.  CV:  Good peripheral perfusion.  Regular rate and rhythm.  Symmetric distal pulses. Resp:  Normal effort.  Clear to auscultation bilaterally.  No wheezing. Abd:  No distention.  Soft nontender. Other:  No lower extremity edema.  Moist mucous membranes.  Slow speech, but no dysarthria.  Normal orientation,  speech and language.  No motor drift.  Sensation symmetric.  NIH stroke scale 0.   ED Results / Procedures / Treatments   Labs (all labs ordered are listed, but only abnormal results are displayed) Labs Reviewed  CBC - Abnormal; Notable for the following components:      Result Value   RBC 2.98 (*)    Hemoglobin 8.4 (*)    HCT 26.7 (*)    RDW 16.9 (*)    All other components within normal limits  CBC - Abnormal; Notable for the following components:   RBC 2.94 (*)    Hemoglobin 8.3 (*)    HCT 26.3 (*)    RDW 17.0 (*)    All other components within normal limits  COMPREHENSIVE METABOLIC PANEL - Abnormal; Notable for the following components:   CO2 20 (*)    Creatinine, Ser 0.42 (*)    Calcium 7.3 (*)    Total Protein 4.4 (*)    Albumin 2.4 (*)    Alkaline Phosphatase 27 (*)  All other components within normal limits  RESP PANEL BY RT-PCR (FLU A&B, COVID) ARPGX2  CBC  HIV ANTIBODY (ROUTINE TESTING W REFLEX)  TROPONIN I (HIGH SENSITIVITY)  TROPONIN I (HIGH SENSITIVITY)     EKG Interpreted by me Sinus rhythm rate of 68.  Left axis, normal intervals.  Poor R wave progression.  Normal ST segments.  T wave inversions in inferior and lateral leads.  No significant change from April 24, 2018.   RADIOLOGY Chest x-ray interpreted by me, appears unremarkable without effusion edema or consolidation.  Radiology report reviewed.   PROCEDURES:  Procedures   MEDICATIONS ORDERED IN ED: Medications  nitroGLYCERIN (NITROSTAT) SL tablet 0.4 mg (has no administration in time range)  acetaminophen (TYLENOL) tablet 650 mg (has no administration in time range)    Or  acetaminophen (TYLENOL) suppository 650 mg (has no administration in time range)  ondansetron (ZOFRAN) tablet 4 mg (has no administration in time range)    Or  ondansetron (ZOFRAN) injection 4 mg (has no administration in time range)  senna-docusate (Senokot-S) tablet 1 tablet (has no administration in time range)   lactated ringers bolus 1,000 mL (1,000 mLs Intravenous New Bag/Given 02/09/22 1155)  iohexol (OMNIPAQUE) 350 MG/ML injection 100 mL (100 mLs Intravenous Contrast Given 02/09/22 1358)     IMPRESSION / MDM / ASSESSMENT AND PLAN / ED COURSE  I reviewed the triage vital signs and the nursing notes.                              Differential diagnosis includes, but is not limited to, non-STEMI, dehydration, viral illness, electrolyte abnormality, pneumonia, unlikely ischemic stroke  Patient's presentation is most consistent with acute presentation with potential threat to life or bodily function.  Patient presents with chest pain lightheadedness and hypotension.  Chest pain is now resolved but still remains somewhat hypotensive.  No other acute complaints, but he does feel like he is less alert and having trouble speaking.  Speech cadence is slowed, but no other focal deficits.  Does not meet criteria for code stroke activation at this time.  Will obtain a CT scan of his head.  We will need to plan for hospitalization due to his high risk chest pain and hypotension.   Clinical Course as of 02/09/22 1538  Thu Feb 09, 2022  1303 Hemoglobin(!): 8.4 Baseline hb is about 12. Pt denies black/bloody stool or other blood loss.  DRE reveals brown stool, hemoccult negative. [PS]  1306 Lab requested recollect of blood samples. Perhaps initial hb level was diluted. Will repeat cbc with chemistry. [PS]    Clinical Course User Index [PS] Sharman Cheek, MD    ----------------------------------------- 3:38 PM on 02/09/2022 ----------------------------------------- Patient continues feeling improved, no recurrent chest pain.  Vital signs remained stable and improved after IV fluid hydration in the ED.  CT angiogram of the chest abdomen pelvis is negative for any acute findings or aortic complications.  Initial troponin is normal.  CT head unremarkable and I doubt stroke at this point given normalization  of his mental status with IV fluids and improved blood pressure.  He is at elevated risk for MACE, so I discussed his care with hospitalist for further evaluation.   FINAL CLINICAL IMPRESSION(S) / ED DIAGNOSES   Final diagnoses:  Chest pain with moderate risk for cardiac etiology  Near syncope  Hypotension, unspecified hypotension type  Chronic congestive heart failure, unspecified heart failure type (HCC)  Rx / DC Orders   ED Discharge Orders     None        Note:  This document was prepared using Dragon voice recognition software and may include unintentional dictation errors.   Sharman Cheek, MD 02/09/22 1539

## 2022-02-09 NOTE — Assessment & Plan Note (Signed)
-   Mirtazapine 30 mg nightly resumed - Recommend patient to continue follow-up with outpatient PCP for close monitoring

## 2022-02-10 DIAGNOSIS — I739 Peripheral vascular disease, unspecified: Secondary | ICD-10-CM | POA: Diagnosis not present

## 2022-02-10 DIAGNOSIS — I10 Essential (primary) hypertension: Secondary | ICD-10-CM

## 2022-02-10 DIAGNOSIS — D649 Anemia, unspecified: Secondary | ICD-10-CM | POA: Diagnosis not present

## 2022-02-10 LAB — BASIC METABOLIC PANEL
Anion gap: 5 (ref 5–15)
BUN: 10 mg/dL (ref 8–23)
CO2: 26 mmol/L (ref 22–32)
Calcium: 8.1 mg/dL — ABNORMAL LOW (ref 8.9–10.3)
Chloride: 113 mmol/L — ABNORMAL HIGH (ref 98–111)
Creatinine, Ser: 0.7 mg/dL (ref 0.61–1.24)
GFR, Estimated: >60 mL/min (ref >60)
Glucose, Bld: 84 mg/dL (ref 70–99)
Potassium: 3.6 mmol/L (ref 3.5–5.1)
Sodium: 144 mmol/L (ref 135–145)

## 2022-02-10 LAB — CBC
HCT: 26.8 % — ABNORMAL LOW (ref 39.0–52.0)
Hemoglobin: 8.6 g/dL — ABNORMAL LOW (ref 13.0–17.0)
MCH: 28.3 pg (ref 26.0–34.0)
MCHC: 32.1 g/dL (ref 30.0–36.0)
MCV: 88.2 fL (ref 80.0–100.0)
Platelets: 233 10*3/uL (ref 150–400)
RBC: 3.04 MIL/uL — ABNORMAL LOW (ref 4.22–5.81)
RDW: 16.7 % — ABNORMAL HIGH (ref 11.5–15.5)
WBC: 6.8 10*3/uL (ref 4.0–10.5)
nRBC: 0 % (ref 0.0–0.2)

## 2022-02-10 LAB — HIV ANTIBODY (ROUTINE TESTING W REFLEX): HIV Screen 4th Generation wRfx: NONREACTIVE

## 2022-02-10 LAB — IRON AND TIBC
Iron: 19 ug/dL — ABNORMAL LOW (ref 45–182)
Saturation Ratios: 7 % — ABNORMAL LOW (ref 17.9–39.5)
TIBC: 280 ug/dL (ref 250–450)
UIBC: 261 ug/dL

## 2022-02-10 LAB — FOLATE: Folate: 8.7 ng/mL (ref >5.9)

## 2022-02-10 LAB — FERRITIN: Ferritin: 7 ng/mL — ABNORMAL LOW (ref 24–336)

## 2022-02-10 LAB — VITAMIN B12: Vitamin B-12: 237 pg/mL (ref 180–914)

## 2022-02-10 MED ORDER — ISOSORBIDE MONONITRATE ER 60 MG PO TB24
60.0000 mg | ORAL_TABLET | Freq: Three times a day (TID) | ORAL | Status: DC
  Administered 2022-02-10 – 2022-02-12 (×7): 60 mg via ORAL
  Filled 2022-02-10 (×8): qty 1

## 2022-02-10 NOTE — Progress Notes (Signed)
Per tele pt had 5 beats of AIVR. Pt asymptomatic sleeping , provider B. Randol Kern aware.

## 2022-02-10 NOTE — Progress Notes (Signed)
Pt voiced concerns over change in medication. Looked through notes, educated patient on reason why some medications were held/ r/t bleeding and hypotensive event. Pt proceeded to call [facility] to speak to heart Doctor there. Louanne Skye 02/10/22 9:44 AM

## 2022-02-10 NOTE — Consult Note (Signed)
Martin Darby, MD [address]  [place]  [place], [place] [address]  Main: [phone]  Fax: [phone] Pager: [phone]   Consultation  Referring Provider:     No ref. provider found Primary Care Physician:  Pccm, Ander Gaster, MD Primary Gastroenterologist:  Dr. Vicente Males        Reason for Consultation:     Symptomatic anemia  Date of Admission:  02/09/2022 Date of Consultation:  02/10/2022         HPI:   Martin Delrossi. is a 66 y.o. male history of chronic pain, peripheral artery disease, carotid artery disease, hypertension, AICD, AAA, ischemic cardiomyopathy on D[place] secondary to hypotension, exertional dyspnea, generalized weakness.  In the ER, patient received IV fluids, labs revealed hemoglobin 8.4 which is a drop from 12.3 since 12/14/2021, negative troponins, normal BUN/creatinine.  Patient is started on pantoprazole drip, kept n.p.o. and GI is consulted for further evaluation.  No reported history of melena, rectal bleeding, abdominal pain, nausea or vomiting, coffee-ground emesis.  Patient's wife is bedside who said that she wanted him to be taken to [facility] because all his doctors are from [place] but EMS brought him to [facility].  She is not happy about it.   Current tobacco use, lives at home with his wife  NSAIDs: BC powder daily for headaches for a long time  Antiplts/Anticoagulants/Anti thrombotics: DAPT, last dose of Plavix on 11/2  GI Procedures:  EGD 06/2017- gastro-duodenitis.  Colonoscopy 06/2017 , 07/2017 -poor prep but not gross lesions to explain weight loss. Prep was inadequate for bowel prep  Past Medical History:  Diagnosis Date   Allergy    CAD (coronary artery disease)    Depression    Frequent headaches    GERD (gastroesophageal reflux disease)    Headache    Hyperlipidemia    Hypertension    PAD (peripheral artery disease) (HCC)    Tobacco abuse     Past Surgical History:  Procedure Laterality  Date   CATARACT EXTRACTION W/PHACO Right 09/13/2021   Procedure: CATARACT EXTRACTION PHACO AND INTRAOCULAR LENS PLACEMENT ([place]) RIGHT;  Surgeon: Birder Robson, MD;  Location: [facility];  Service: Ophthalmology;  Laterality: Right;  3.53 0:43.1   CATARACT EXTRACTION W/PHACO Left 09/27/2021   Procedure: CATARACT EXTRACTION PHACO AND INTRAOCULAR LENS PLACEMENT ([facility]) LEFT 4.79 00:25.3;  Surgeon: Birder Robson, MD;  Location: [facility];  Service: Ophthalmology;  Laterality: Left;   COLONOSCOPY WITH PROPOFOL N/A 06/20/2017   Procedure: COLONOSCOPY WITH PROPOFOL;  Surgeon: Jonathon Bellows, MD;  Location: [facility] ENDOSCOPY;  Service: Gastroenterology;  Laterality: N/A;   COLONOSCOPY WITH PROPOFOL N/A 07/24/2017   Procedure: COLONOSCOPY WITH PROPOFOL;  Surgeon: Jonathon Bellows, MD;  Location: [facility] ENDOSCOPY;  Service: Gastroenterology;  Laterality: N/A;   ESOPHAGOGASTRODUODENOSCOPY (EGD) WITH PROPOFOL N/A 06/20/2017   Procedure: ESOPHAGOGASTRODUODENOSCOPY (EGD) WITH PROPOFOL;  Surgeon: Jonathon Bellows, MD;  Location: [facility] ENDOSCOPY;  Service: Gastroenterology;  Laterality: N/A;   PERCUTANEOUS CORONARY STENT INTERVENTION (PCI-S)     STOMACH SURGERY     2009 [facility] per chart review pt had AAA repair    TONSILLECTOMY       Current Facility-Administered Medications:    acetaminophen (TYLENOL) tablet 650 mg, 650 mg, Oral, Q6H PRN **OR** acetaminophen (TYLENOL) suppository 650 mg, 650 mg, Rectal, Q6H PRN, Cox, Amy N, DO   oxyCODONE (Oxy IR/ROXICODONE) immediate release tablet 10 mg, 10 mg, Oral, Q8H PRN, 10 mg at 02/10/22 1005 **AND** acetaminophen (  TYLENOL) tablet 650 mg, 650 mg, Oral, Q8H, Darrick Penna, RPH, 650 mg at 02/10/22 1509   atorvastatin (LIPITOR) tablet 80 mg, 80 mg, Oral, Daily, Cox, Amy N, DO, 80 mg at 02/09/22 1807   ipratropium (ATROVENT) nebulizer solution 0.25 mg, 0.25 mg, Inhalation, Q6H PRN, Cox, Amy N, DO, 0.25 mg at 02/10/22 0743   isosorbide mononitrate (IMDUR) 24 hr tablet  60 mg, 60 mg, Oral, TID, Wyvonnia Dusky, MD, 60 mg at 02/10/22 1509   lisinopril (ZESTRIL) tablet 2.5 mg, 2.5 mg, Oral, Daily, Cox, Amy N, DO, 2.5 mg at 02/10/22 8333   metoprolol succinate (TOPROL-XL) 24 hr tablet 25 mg, 25 mg, Oral, Daily, Cox, Amy N, DO, 25 mg at 02/10/22 0942   mirtazapine (REMERON) tablet 30 mg, 30 mg, Oral, QHS, Cox, Amy N, DO, 30 mg at 02/09/22 2147   nicotine (NICODERM CQ - dosed in mg/24 hours) patch 14 mg, 14 mg, Transdermal, Daily PRN, Cox, Amy N, DO   nitroGLYCERIN (NITROSTAT) SL tablet 0.4 mg, 0.4 mg, Sublingual, Q5 min PRN, Cox, Amy N, DO   ondansetron (ZOFRAN) tablet 4 mg, 4 mg, Oral, Q6H PRN **OR** ondansetron (ZOFRAN) injection 4 mg, 4 mg, Intravenous, Q6H PRN, Cox, Amy N, DO   [START ON 02/13/2022] pantoprazole (PROTONIX) injection 40 mg, 40 mg, Intravenous, Q12H, Cox, Amy N, DO   pantoprozole (PROTONIX) 80 mg /NS 100 mL infusion, 8 mg/hr, Intravenous, Continuous, Cox, Amy N, DO, Last Rate: 10 mL/hr at 02/10/22 1306, 8 mg/hr at 02/10/22 1306   pyridOXINE (VITAMIN B6) tablet 100 mg, 100 mg, Oral, Daily, Cox, Amy N, DO, 100 mg at 02/10/22 0942   senna-docusate (Senokot-S) tablet 1 tablet, 1 tablet, Oral, QHS PRN, Cox, Amy N, DO   Family History  Problem Relation Age of Onset   Diabetes Mother    Heart disease Mother    Hypertension Mother    Stroke Mother    Prostate cancer Father    Cancer Father        prostate cancer in 55s   Diabetes Sister    Heart disease Sister    Ulcers Other        unknown who had ulcers in family      Social History   Tobacco Use   Smoking status: Every Day    Packs/day: 0.25    Years: 50.00    Total pack years: 12.50    Types: Cigarettes    Passive exposure: Current   Smokeless tobacco: Never   Tobacco comments:    Started smoking age 73  Vaping Use   Vaping Use: Never used  Substance Use Topics   Alcohol use: No    Alcohol/week: 0.0 standard drinks of alcohol    Comment: quit 1 year ago   Drug use: No     Allergies as of 02/09/2022   (No Known Allergies)    Review of Systems:    All systems reviewed and negative except where noted in HPI.   Physical Exam:  Vital signs in last 24 hours: Temp:  [98.3 F (36.8 C)-99.5 F (37.5 C)] 98.6 F (37 C) (11/03 1529) Pulse Rate:  [57-75] 65 (11/03 1529) Resp:  [12-19] 18 (11/03 1529) BP: (103-129)/(56-82) 129/82 (11/03 1529) SpO2:  [90 %-99 %] 96 % (11/03 1529) Last BM Date : 02/09/22 General:   Pleasant, cooperative in NAD Head:  Normocephalic and atraumatic. Eyes:   No icterus.   Conjunctiva pale. PERRLA. Ears:  Normal auditory acuity. Neck:  Supple; no  masses or thyroidomegaly Lungs: Respirations even and unlabored. Lungs clear to auscultation bilaterally.   No wheezes, crackles, or rhonchi.  Heart:  Regular rate and rhythm;  Without murmur, clicks, rubs or gallops Abdomen:  Soft, nondistended, nontender. Normal bowel sounds. No appreciable masses or hepatomegaly.  No rebound or guarding.  Rectal:  Not performed. Msk:  Symmetrical without gross deformities.  Strength generalized weakness Extremities:  Without edema, cyanosis or clubbing. Neurologic:  Alert and oriented x3;  grossly normal neurologically. Skin:  Intact without significant lesions or rashes. Psych:  Alert and cooperative. Normal affect.  LAB RESULTS:    Latest Ref Rng & Units 02/10/2022    4:12 AM 02/09/2022    4:31 PM 02/09/2022   11:23 AM  CBC  WBC 4.0 - 10.5 K/uL 6.8  7.1  5.0   Hemoglobin 13.0 - 17.0 g/dL 8.6  8.6  8.4   Hematocrit 39.0 - 52.0 % 26.8  27.2  26.7   Platelets 150 - 400 K/uL 233  234  231     BMET    Latest Ref Rng & Units 02/10/2022    4:12 AM 02/09/2022   11:22 AM 12/14/2021    8:39 AM  BMP  Glucose 70 - 99 mg/dL 84  76  143   BUN 8 - 23 mg/dL _0 Creatinine 0.61 - 1.24 mg/dL 0.70  0.42  0.93   Sodium 135 - 145 mmol/L 144  138  139   Potassium 3.5 - 5.1 mmol/L 3.6  3.6  3.9   Chloride 98 - 111 mmol/L 113  110  103   CO2 22 -  32 mmol/L _1 Calcium 8.9 - 10.3 mg/dL 8.1  7.3  9.1     LFT    Latest Ref Rng & Units 02/09/2022   11:22 AM 12/14/2021    8:39 AM 06/07/2021   11:40 AM  Hepatic Function  Total Protein 6.5 - 8.1 g/dL 4.4  6.4  7.1   Albumin 3.5 - 5.0 g/dL 2.4  3.8  4.5   AST 15 - 41 U/L _2 ALT 0 - 44 U/L _3 Alk Phosphatase 38 - 126 U/L 27  44  49   Total Bilirubin 0.3 - 1.2 mg/dL 0.3  0.2  0.3      STUDIES: CT Angio Chest/Abd/Pel for Dissection W and/or Wo Contrast  Result Date: 02/09/2022 CLINICAL DATA:  66 year old male with altered mental status. Weakness, malaise. Chest pain radiating to the left shoulder. History of MI. EXAM: CT ANGIOGRAPHY CHEST, ABDOMEN AND PELVIS TECHNIQUE: Non-contrast CT of the chest was initially obtained. Multidetector CT imaging through the chest, abdomen and pelvis was performed using the standard protocol during bolus administration of intravenous contrast. Multiplanar reconstructed images and MIPs were obtained and reviewed to evaluate the vascular anatomy. RADIATION DOSE REDUCTION: This exam was performed according to the departmental dose-optimization program which includes automated exposure control, adjustment of the mA and/or kV according to patient size and/or use of iterative reconstruction technique. CONTRAST:  167m OMNIPAQUE IOHEXOL 350 MG/ML SOLN COMPARISON:  CT Chest, Abdomen, and Pelvis 08/16/2017. CT Abdomen and Pelvis 08/21/2021. FINDINGS: CTA CHEST FINDINGS Cardiovascular: Chronic left chest AICD. Cardiomegaly appears stable since 2019. No pericardial effusion. Calcified coronary artery atherosclerosis and/or stents. Calcified aortic atherosclerosis. Negative for aortic aneurysm or dissection. No visible pulmonary artery filling defect. Mediastinum/Nodes: Negative. No mediastinal mass or  lymphadenopathy. Lungs/Pleura: Chronic centrilobular and paraseptal/bullous emphysema. Lung volumes are stable since 2019. Non dependent retained  secretions along the left anterolateral wall of the trachea series 7, image 51, but major airways are patent. Dependent left greater than right lung base opacity has increased and is only partially enhancing. No definite pleural fluid. But elsewhere the lungs are stable since 2019. Musculoskeletal: Chronic left anterior 4th rib fracture redemonstrated. No acute osseous abnormality identified. Review of the MIP images confirms the above findings. CTA ABDOMEN AND PELVIS FINDINGS VASCULAR Aortoiliac calcified atherosclerosis. 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 Embolization coils there which also track into the left up to rater appears stable since the noncontrast CT this April. Other major arterial structures appear patent. No dissection identified. Tortuous abdominal aorta but no discrete abdominal aortic aneurysm. Review of the MIP images confirms the above findings. NON-VASCULAR Hepatobiliary: Liver and gallbladder appear negative. Pancreas: Negative. Spleen: Negative. Adrenals/Urinary Tract: Stable kidneys and adrenal gland since 2019. Chronic left lower pole nephrolithiasis now measures up to 9 mm. No hydronephrosis. Unremarkable bladder. Stomach/Bowel: Stool ball in the rectum increased from prior exams. Upstream large bowel retained stool and redundant sigmoid. Chronic sigmoid diverticulosis is apparent, but there is limited bowel detail due to early contrast timing and lack of oral contrast. However, the descending colon appears abnormally thickened and indistinct such as on series 5, image 125. The wall thickening appears to extend at least to the proximal sigmoid. No pneumoperitoneum identified. No definite free fluid. Upstream transverse colon is difficult to delineate and probably completely decompressed through the midline. Right: At the hepatic flexure also appears indistinct.  Cecum has a more normal appearance. No dilated small bowel. Questionable visualization of the appendix. No pericecal inflammation identified. Lymphatic: No lymphadenopathy is evident. Reproductive: No acute finding. Other: Bulky but stable prostate and bilateral seminal vesicles. No convincing pelvic free fluid. Musculoskeletal: Chronic left femur ORIF. Chronic L4-L5 degeneration. No acute osseous abnormality identified. Review of the MIP images confirms the above findings. IMPRESSION: 1. Negative for aortic dissection or aneurysm. Positive for Aortic Atherosclerosis (ICD10-I70.0 and chronic left iliac artery stenting and coil embolization. 2. Suboptimal bowel detail with CTA technique but indistinct appearance of the large bowel suspicious for a widespread Acute Colitis. Underlying diverticulosis, rectal stool ball. No free air, free fluid, or bowel obstruction. If the diagnosis is in doubt then repeat CT Abdomen and Pelvis with oral and IV contrast would be ideal. 3. Bullous Emphysema (ICD10-J43.9). Bilateral dependent lower lobe opacity, probably atelectasis but difficult to exclude infection. No significant pleural fluid. 4. Chronic Cardiomegaly, left nephrolithiasis. Electronically Signed   By: Genevie Ann M.D.   On: 02/09/2022 14:28   CT Head Wo Contrast  Result Date: 02/09/2022 CLINICAL DATA:  Mental status change, unknown cause EXAM: CT HEAD WITHOUT CONTRAST TECHNIQUE: Contiguous axial images were obtained from the base of the skull through the vertex without intravenous contrast. RADIATION DOSE REDUCTION: This exam was performed according to the departmental dose-optimization program which includes automated exposure control, adjustment of the mA and/or kV according to patient size and/or use of iterative reconstruction technique. COMPARISON:  None Available. FINDINGS: Brain: No evidence of acute infarction, hemorrhage, hydrocephalus, extra-axial collection or mass lesion/mass effect. Vascular: No  hyperdense vessel identified. Skull: No acute fracture. Sinuses/Orbits: Clear visualized sinuses. No acute orbital findings. Other: No mastoid effusions. IMPRESSION: No evidence of acute intracranial abnormality. Electronically Signed  By: Margaretha Sheffield M.D.   On: 02/09/2022 12:23   DG Chest Portable 1 View  Result Date: 02/09/2022 CLINICAL DATA:  Chest pain, weakness EXAM: PORTABLE CHEST 1 VIEW COMPARISON:  04/18/2018 FINDINGS: Lungs are hyperinflated as can be seen with COPD. Mild bilateral interstitial thickening. No focal consolidation. No pleural effusion or pneumothorax. Heart and mediastinal contours are unremarkable. No acute osseous abnormality. IMPRESSION: 1. Mild bilateral interstitial thickening which may reflect mild interstitial edema versus infection. 2. COPD. Electronically Signed   By: Kathreen Devoid M.D.   On: 02/09/2022 11:23      Impression / Plan:   Martin Hernandez. is a 66 y.o. male with chronic back pain, history of coronary artery disease, MI, AICD, peripheral artery disease, chronic tobacco use, severe vasculopathy, on DAPT presented with severe symptomatic anemia with no evidence of active GI bleed, history of chronic BC powder use for headaches  Symptomatic anemia with no evidence of active GI bleed in setting of DAPT and long-term NSAID use Patient's hemoglobin is stable around 8 within last 24 hours Recommend iron panel, B12 and folate levels Okay to continue IV PPI Monitor CBC daily, transfuse to maintain hemoglobin above 8 Recommend soft diet today, clear liquid diet starting tomorrow a.m. Patient's last dose of Plavix was 11/2, therefore would not be able to proceed with endoscopic evaluation for 72 hours which will be on Sunday Recommend cardiology clearance given patient's history of extensive coronary artery disease before proceeding with endoscopic evaluation Avoid NSAID use including BC powder, Goody powder Discussed my recommendations with  patient and his wife  Thank you for involving me in the care of this patient.  GI will follow along with you    LOS: 0 days   Sherri Sear, MD  02/10/2022, 4:03 PM    Note: This dictation was prepared with Dragon dictation along with smaller phrase technology. Any transcriptional errors that result from this process are unintentional.

## 2022-02-10 NOTE — Plan of Care (Signed)

## 2022-02-10 NOTE — Progress Notes (Signed)
PROGRESS NOTE    Martin Hernandez.  [id number] DOB: 04/27/1955 DOA: 02/09/2022 PCP: [facility], [facility], MD   Assessment & Plan:   Principal Problem:   Symptomatic anemia Active Problems:   AAA (abdominal aortic aneurysm) (HCC)   HLD (hyperlipidemia)   Cardiomyopathy, ischemic   PAD (peripheral artery disease) (HCC)   Current tobacco use   Weight loss   Essential hypertension   Anxiety and depression   Benign prostatic hyperplasia with urinary retention   Atherosclerosis   Chest pain  Assessment and Plan: Symptomatic anemia: etiology unclear, possible GI bleed.  Takes BC powder daily for headaches. Continue on IV PPI. GI consulted. Holding aspirin, plavix.    HTN: continue on metoprolol, lisinopril, imdur. Hold for MAP < 65    Weight loss/underweight: continue on home dose of mirtazapine. BMI 17.7.    Current tobacco use: smoking cessation counseling x 5 mins. Nicotine patch to prevent w/drawl   PAD: continue on statin. Holding plavix, aspirin.    Ischemic cardiomyopathy: continue on statin, lisinopril, imdur, metoprolol. Holding plavix, aspirin   HLD: continue on statin        DVT prophylaxis: SCDs Code Status: full  Family Communication: discussed pt's care w/ pt's family at bedside and answered their questions  Disposition Plan: likely d/c back home   Level of care: Telemetry Cardiac  Status is: Observation The patient remains OBS appropriate and will d/c before 2 midnights.   Consultants:  GI   Procedures:   Antimicrobials:    Subjective: Pt c/o   Objective: Vitals:   02/09/22 2058 02/09/22 2303 02/10/22 0734 02/10/22 0745  BP: 105/70 104/71 121/78   Pulse: 72 74 66   Resp: 18 18 18    Temp: 98.6 F (37 C) 99.5 F (37.5 C) 98.8 F (37.1 C)   TempSrc:   Oral   SpO2: 99% 93% 93% 90%  Weight:      Height:        Intake/Output Summary (Last 24 hours) at 02/10/2022 0754 Last data filed at 02/10/2022 0735 Gross per 24 hour   Intake 91.83 ml  Output 850 ml  Net -758.17 ml   Filed Weights   02/09/22 1056  Weight: 54.4 kg    Examination:  General exam: Appears frustrated & agitated. Frail appearing  Respiratory system: Clear to auscultation. Respiratory effort normal. Cardiovascular system: S1 & S2+. No rubs, gallops or clicks. No pedal edema. Gastrointestinal system: Abdomen is nondistended, soft and nontender. Normal bowel sounds heard. Central nervous system: Alert and oriented. Moves all extremities  Psychiatry: Judgement and insight appear normal. Frustrated mood and affect     Data Reviewed: I have personally reviewed following labs and imaging studies  CBC: Recent Labs  Lab 02/09/22 1122 02/09/22 1123 02/09/22 1631 02/10/22 0412  WBC 7.9 5.0 7.1 6.8  HGB 8.3* 8.4* 8.6* 8.6*  HCT 26.3* 26.7* 27.2* 26.8*  MCV 89.5 89.6 88.6 88.2  PLT 220 231 234 0000000   Basic Metabolic Panel: Recent Labs  Lab 02/09/22 1122 02/10/22 0412  NA 138 144  K 3.6 3.6  CL 110 113*  CO2 20* 26  GLUCOSE 76 84  BUN 11 10  CREATININE 0.42* 0.70  CALCIUM 7.3* 8.1*   GFR: Estimated Creatinine Clearance: 68.9 mL/min (by C-G formula based on SCr of 0.7 mg/dL). Liver Function Tests: Recent Labs  Lab 02/09/22 1122  AST 19  ALT 7  ALKPHOS 27*  BILITOT 0.3  PROT 4.4*  ALBUMIN 2.4*   No results for  input(s): "LIPASE", "AMYLASE" in the last 168 hours. No results for input(s): "AMMONIA" in the last 168 hours. Coagulation Profile: No results for input(s): "INR", "PROTIME" in the last 168 hours. Cardiac Enzymes: No results for input(s): "CKTOTAL", "CKMB", "CKMBINDEX", "TROPONINI" in the last 168 hours. BNP (last 3 results) No results for input(s): "PROBNP" in the last 8760 hours. HbA1C: No results for input(s): "HGBA1C" in the last 72 hours. CBG: No results for input(s): "GLUCAP" in the last 168 hours. Lipid Profile: No results for input(s): "CHOL", "HDL", "LDLCALC", "TRIG", "CHOLHDL", "LDLDIRECT" in  the last 72 hours. Thyroid Function Tests: No results for input(s): "TSH", "T4TOTAL", "FREET4", "T3FREE", "THYROIDAB" in the last 72 hours. Anemia Panel: No results for input(s): "VITAMINB12", "FOLATE", "FERRITIN", "TIBC", "IRON", "RETICCTPCT" in the last 72 hours. Sepsis Labs: No results for input(s): "PROCALCITON", "LATICACIDVEN" in the last 168 hours.  Recent Results (from the past 240 hour(s))  Resp Panel by RT-PCR (Flu A&B, Covid) Anterior Nasal Swab     Status: None   Collection Time: 02/09/22 12:03 PM   Specimen: Anterior Nasal Swab  Result Value Ref Range Status   SARS Coronavirus 2 by RT PCR NEGATIVE NEGATIVE Final    Comment: (NOTE) SARS-CoV-2 target nucleic acids are NOT DETECTED.  The SARS-CoV-2 RNA is generally detectable in upper respiratory specimens during the acute phase of infection. The lowest concentration of SARS-CoV-2 viral copies this assay can detect is 138 copies/mL. A negative result does not preclude SARS-Cov-2 infection and should not be used as the sole basis for treatment or other patient management decisions. A negative result may occur with  improper specimen collection/handling, submission of specimen other than nasopharyngeal swab, presence of viral mutation(s) within the areas targeted by this assay, and inadequate number of viral copies(<138 copies/mL). A negative result must be combined with clinical observations, patient history, and epidemiological information. The expected result is Negative.  Fact Sheet for Patients:  EntrepreneurPulse.com.au  Fact Sheet for Healthcare Providers:  IncredibleEmployment.be  This test is no t yet approved or cleared by the [place] FDA and  has been authorized for detection and/or diagnosis of SARS-CoV-2 by FDA under an Emergency Use Authorization (EUA). This EUA will remain  in effect (meaning this test can be used) for the duration of the COVID-19 declaration under  Section 564(b)(1) of the Act, 21 U.S.C.section 360bbb-3(b)(1), unless the authorization is terminated  or revoked sooner.       Influenza A by PCR NEGATIVE NEGATIVE Final   Influenza B by PCR NEGATIVE NEGATIVE Final    Comment: (NOTE) The Xpert Xpress SARS-CoV-2/FLU/RSV plus assay is intended as an aid in the diagnosis of influenza from Nasopharyngeal swab specimens and should not be used as a sole basis for treatment. Nasal washings and aspirates are unacceptable for Xpert Xpress SARS-CoV-2/FLU/RSV testing.  Fact Sheet for Patients: EntrepreneurPulse.com.au  Fact Sheet for Healthcare Providers: IncredibleEmployment.be  This test is not yet approved or cleared by the [place] FDA and has been authorized for detection and/or diagnosis of SARS-CoV-2 by FDA under an Emergency Use Authorization (EUA). This EUA will remain in effect (meaning this test can be used) for the duration of the COVID-19 declaration under Section 564(b)(1) of the Act, 21 U.S.C. section 360bbb-3(b)(1), unless the authorization is terminated or revoked.  Performed at [facility], [address] [place], [place] [address]          Radiology Studies: CT Angio Chest/Abd/Pel for Dissection W and/or Wo Contrast  Result Date: 02/09/2022 CLINICAL DATA:  66 year old male with altered mental status. Weakness, malaise. Chest pain radiating to the left shoulder. History of MI. EXAM: CT ANGIOGRAPHY CHEST, ABDOMEN AND PELVIS TECHNIQUE: Non-contrast CT of the chest was initially obtained. Multidetector CT imaging through the chest, abdomen and pelvis was performed using the standard protocol during bolus administration of intravenous contrast. Multiplanar reconstructed images and MIPs were obtained and reviewed to evaluate the vascular anatomy. RADIATION DOSE REDUCTION: This exam was performed according to the departmental dose-optimization program which includes  automated exposure control, adjustment of the mA and/or kV according to patient size and/or use of iterative reconstruction technique. CONTRAST:  195mL OMNIPAQUE IOHEXOL 350 MG/ML SOLN COMPARISON:  CT Chest, Abdomen, and Pelvis 08/16/2017. CT Abdomen and Pelvis 08/21/2021. FINDINGS: CTA CHEST FINDINGS Cardiovascular: Chronic left chest AICD. Cardiomegaly appears stable since 2019. No pericardial effusion. Calcified coronary artery atherosclerosis and/or stents. Calcified aortic atherosclerosis. Negative for aortic aneurysm or dissection. No visible pulmonary artery filling defect. Mediastinum/Nodes: Negative. No mediastinal mass or lymphadenopathy. Lungs/Pleura: Chronic centrilobular and paraseptal/bullous emphysema. Lung volumes are stable since 2019. Non dependent retained secretions along the left anterolateral wall of the trachea series 7, image 51, but major airways are patent. Dependent left greater than right lung base opacity has increased and is only partially enhancing. No definite pleural fluid. But elsewhere the lungs are stable since 2019. Musculoskeletal: Chronic left anterior 4th rib fracture redemonstrated. No acute osseous abnormality identified. Review of the MIP images confirms the above findings. CTA ABDOMEN AND PELVIS FINDINGS VASCULAR Aortoiliac calcified atherosclerosis. 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 Embolization coils there which also track into the left up to rater appears stable since the noncontrast CT this April. Other major arterial structures appear patent. No dissection identified. Tortuous abdominal aorta but no discrete abdominal aortic aneurysm. Review of the MIP images confirms the above findings. NON-VASCULAR Hepatobiliary: Liver and gallbladder appear negative. Pancreas: Negative. Spleen: Negative. Adrenals/Urinary Tract: Stable kidneys and adrenal  gland since 2019. Chronic left lower pole nephrolithiasis now measures up to 9 mm. No hydronephrosis. Unremarkable bladder. Stomach/Bowel: Stool ball in the rectum increased from prior exams. Upstream large bowel retained stool and redundant sigmoid. Chronic sigmoid diverticulosis is apparent, but there is limited bowel detail due to early contrast timing and lack of oral contrast. However, the descending colon appears abnormally thickened and indistinct such as on series 5, image 125. The wall thickening appears to extend at least to the proximal sigmoid. No pneumoperitoneum identified. No definite free fluid. Upstream transverse colon is difficult to delineate and probably completely decompressed through the midline. Right: At the hepatic flexure also appears indistinct. Cecum has a more normal appearance. No dilated small bowel. Questionable visualization of the appendix. No pericecal inflammation identified. Lymphatic: No lymphadenopathy is evident. Reproductive: No acute finding. Other: Bulky but stable prostate and bilateral seminal vesicles. No convincing pelvic free fluid. Musculoskeletal: Chronic left femur ORIF. Chronic L4-L5 degeneration. No acute osseous abnormality identified. Review of the MIP images confirms the above findings. IMPRESSION: 1. Negative for aortic dissection or aneurysm. Positive for Aortic Atherosclerosis (ICD10-I70.0 and chronic left iliac artery stenting and coil embolization. 2. Suboptimal bowel detail with CTA technique but indistinct appearance of the large bowel suspicious for a widespread Acute Colitis. Underlying diverticulosis, rectal stool ball. No free air, free fluid, or bowel obstruction. If the diagnosis is in doubt then repeat CT Abdomen and Pelvis with oral and IV  contrast would be ideal. 3. Bullous Emphysema (ICD10-J43.9). Bilateral dependent lower lobe opacity, probably atelectasis but difficult to exclude infection. No significant pleural fluid. 4. Chronic  Cardiomegaly, left nephrolithiasis. Electronically Signed   By: Genevie Ann M.D.   On: 02/09/2022 14:28   CT Head Wo Contrast  Result Date: 02/09/2022 CLINICAL DATA:  Mental status change, unknown cause EXAM: CT HEAD WITHOUT CONTRAST TECHNIQUE: Contiguous axial images were obtained from the base of the skull through the vertex without intravenous contrast. RADIATION DOSE REDUCTION: This exam was performed according to the departmental dose-optimization program which includes automated exposure control, adjustment of the mA and/or kV according to patient size and/or use of iterative reconstruction technique. COMPARISON:  None Available. FINDINGS: Brain: No evidence of acute infarction, hemorrhage, hydrocephalus, extra-axial collection or mass lesion/mass effect. Vascular: No hyperdense vessel identified. Skull: No acute fracture. Sinuses/Orbits: Clear visualized sinuses. No acute orbital findings. Other: No mastoid effusions. IMPRESSION: No evidence of acute intracranial abnormality. Electronically Signed   By: Margaretha Sheffield M.D.   On: 02/09/2022 12:23   DG Chest Portable 1 View  Result Date: 02/09/2022 CLINICAL DATA:  Chest pain, weakness EXAM: PORTABLE CHEST 1 VIEW COMPARISON:  04/18/2018 FINDINGS: Lungs are hyperinflated as can be seen with COPD. Mild bilateral interstitial thickening. No focal consolidation. No pleural effusion or pneumothorax. Heart and mediastinal contours are unremarkable. No acute osseous abnormality. IMPRESSION: 1. Mild bilateral interstitial thickening which may reflect mild interstitial edema versus infection. 2. COPD. Electronically Signed   By: Kathreen Devoid M.D.   On: 02/09/2022 11:23        Scheduled Meds:  acetaminophen  650 mg Oral Q8H   atorvastatin  80 mg Oral Daily   lisinopril  2.5 mg Oral Daily   metoprolol succinate  25 mg Oral Daily   mirtazapine  30 mg Oral QHS   [START ON 02/13/2022] pantoprazole  40 mg Intravenous Q12H   pyridOXINE  100 mg Oral Daily    Continuous Infusions:  pantoprazole 8 mg/hr (02/10/22 0439)     LOS: 0 days    Time spent: 35 mins     Wyvonnia Dusky, MD [facility] Pager [phone]  If 7PM-7AM, please contact night-coverage [email or web address] 02/10/2022, 7:54 AM

## 2022-02-10 NOTE — Consult Note (Signed)
CARDIOLOGY CONSULT NOTE               Patient ID: Martin Hernandez. MRN: [id number] DOB/AGE: Nov 11, 1955 66 y.o.  Admit date: 02/09/2022 Referring Physician Dr. Londell Moh hospitalist Primary Physician [facility] Primary Cardiologist [facility] Reason for Consultation cardiomyopathy heart failure preop possible EGD  HPI: Patient is a 66 year old male multiple medical problems multivessel coronary disease multiple myocardial infarction AICD in place for ischemic cardiomyopathy AAA peripheral vascular disease with claudication chronic persistent tobacco abuse hyperlipidemia hypertension GERD neuropathy insomnia shortness of breath he presented after chronic use of PACs for pain atypical chest pain and angina pain radiating down his left arm reportedly [place] took nitroglycerin and then felt lightheaded and weak so EMS was called and brought to the emergency room.  On telemetry has sinus rhythm but multifocal arrhythmias PVCs and PACs patient had some relative hypotension as well.  Headache is better but he been taking multiple BCs recently for headache but continues to smoke on Eliquis  Review of systems complete and found to be negative unless listed above     Past Medical History:  Diagnosis Date   Allergy    CAD (coronary artery disease)    Depression    Frequent headaches    GERD (gastroesophageal reflux disease)    Headache    Hyperlipidemia    Hypertension    PAD (peripheral artery disease) (HCC)    Tobacco abuse     Past Surgical History:  Procedure Laterality Date   CATARACT EXTRACTION W/PHACO Right 09/13/2021   Procedure: CATARACT EXTRACTION PHACO AND INTRAOCULAR LENS PLACEMENT (IOC) RIGHT;  Surgeon: Galen Manila, MD;  Location: [facility];  Service: Ophthalmology;  Laterality: Right;  3.53 0:43.1   CATARACT EXTRACTION W/PHACO Left 09/27/2021   Procedure: CATARACT EXTRACTION PHACO AND INTRAOCULAR LENS PLACEMENT  (IOC) LEFT 4.79 00:25.3;  Surgeon: Galen Manila, MD;  Location: [facility];  Service: Ophthalmology;  Laterality: Left;   COLONOSCOPY WITH PROPOFOL N/A 06/20/2017   Procedure: COLONOSCOPY WITH PROPOFOL;  Surgeon: Wyline Mood, MD;  Location: [facility];  Service: Gastroenterology;  Laterality: N/A;   COLONOSCOPY WITH PROPOFOL N/A 07/24/2017   Procedure: COLONOSCOPY WITH PROPOFOL;  Surgeon: Wyline Mood, MD;  Location: [facility] END[facility]COPY;  Service: Gastroenterology;  Laterality: N/A;   ESOPHAGOGASTRODUODENOSCOPY (EGD) WITH PROPOFOL N/A 06/20/2017   Procedure: ESOPHAGOGASTRODUODENOSCOPY (EGD) WITH PROPOFOL;  Surgeon: Wyline Mood, MD;  Location: [facility] END[facility]COPY;  Service: Gastroenterology;  Laterality: N/A;   PERCUTANEOUS CORONARY STENT INTERVENTION (PCI-S)     STOMACH SURGERY     2009 [facility] per chart review pt had AAA repair    TONSILLECTOMY      Medications Prior to Admission  Medication Sig Dispense Refill Last Dose   alfuzosin (UROXATRAL) 10 MG 24 hr tablet Take 1 tablet (10 mg total) by mouth daily with breakfast. Dc flomax 0.4 90 tablet 3 02/09/2022   aspirin 81 MG tablet Take 81 mg by mouth daily.   02/09/2022   atorvastatin (LIPITOR) 80 MG tablet Take 1 tablet (80 mg total) by mouth daily at 6pm. 90 tablet 3 02/08/2022   Baclofen 5 MG TABS Take 1 tablet by mouth twice daily. 180 tablet 3 02/09/2022   clopidogrel (PLAVIX) 75 MG tablet 1 pill daily 90 tablet 3 02/09/2022   furosemide (LASIX) 20 MG tablet Take 20 mg by mouth daily.   02/09/2022   gabapentin (NEURONTIN) 300 MG capsule Take 300 mg by mouth 3 (three) times  daily.   02/09/2022 at 1200   isosorbide mononitrate (IMDUR) 60 MG 24 hr tablet Take 1 tablet by mouth in the morning, at noon, and at bedtime.   02/09/2022   levocetirizine (XYZAL) 5 MG tablet Take 1 tablet by mouth every evening. 90 tablet 1 02/08/2022   lisinopril (ZESTRIL) 2.5 MG tablet Take 2.5 mg by mouth daily.   02/09/2022   metoprolol succinate (TOPROL-XL) 25 MG 24 hr  tablet Take 1 tablet (25 mg total) by mouth daily. 90 tablet 3 02/09/2022   mirtazapine (REMERON) 30 MG tablet Take 1 tablet by mouth nightly. 90 tablet 2 02/08/2022   oxyCODONE-acetaminophen (PERCOCET) 10-325 MG tablet Take 1 tablet by mouth as needed.   02/09/2022 at 0600   pantoprazole (PROTONIX) 40 MG tablet Take 1 tablet by mouth daily 30 minutes before food. 90 tablet 3 02/09/2022   pyridOXINE (VITAMIN B-6) 100 MG tablet Take 1 tablet (100 mg total) by mouth daily. 90 tablet 3 02/09/2022   zolpidem (AMBIEN) 10 MG tablet Take 1 tablet (10 mg total) by mouth at bedtime as needed. for sleep 90 tablet 1 02/08/2022   Buprenorphine HCl (BELBUCA) 600 MCG FILM Place inside cheek 2 (two) times daily. (Patient not taking: Reported on 02/09/2022)   Not Taking   ipratropium (ATROVENT) 0.02 % nebulizer solution Inhale into the lungs.      nitroGLYCERIN (NITROSTAT) 0.4 MG SL tablet Place 1 tablet (0.4 mg total) under the tongue every 5 (five) minutes as needed for chest pain. 90 tablet 3 1030   tamsulosin (FLOMAX) 0.4 MG CAPS capsule  (Patient not taking: Reported on 02/09/2022)   Not Taking   thiamine (VITAMIN B-1) 100 MG tablet Take 100 mg by mouth daily. (Patient not taking: Reported on 02/09/2022)   Not Taking   Social History   Socioeconomic History   Marital status: Married    Spouse name: Not on file   Number of children: Not on file   Years of education: Not on file   Highest education level: Not on file  Occupational History   Not on file  Tobacco Use   Smoking status: Every Day    Packs/day: 0.25    Years: 50.00    Total pack years: 12.50    Types: Cigarettes    Passive exposure: Current   Smokeless tobacco: Never   Tobacco comments:    Started smoking age 56  Vaping Use   Vaping Use: Never used  Substance and Sexual Activity   Alcohol use: No    Alcohol/week: 0.0 standard drinks of alcohol    Comment: quit 1 year ago   Drug use: No   Sexual activity: Not Currently  Other Topics  Concern   Not on file  Social History Narrative   Disabled    4 kids 2 living    Used to do mill work    Smoker since age 70 max 2 pk per week now 1 ppd as of 02/2017    Married    Social Determinants of Health   Financial Resource Strain: Low Risk  (05/24/2021)   Overall Financial Resource Strain (CARDIA)    Difficulty of Paying Living Expenses: Not hard at all  Food Insecurity: No Food Insecurity (05/24/2021)   Hunger Vital Sign    Worried About Running Out of Food in the Last Year: Never true    Ran Out of Food in the Last Year: Never true  Transportation Needs: No Transportation Needs (05/24/2021)   PRAPARE - Transportation  Lack of Transportation (Medical): No    Lack of Transportation (Non-Medical): No  Physical Activity: Not on file  Stress: No Stress Concern Present (05/24/2021)   [employer] of Occupational Health - Occupational Stress Questionnaire    Feeling of Stress : Not at all  Social Connections: Unknown (05/24/2021)   Social Connection and Isolation Panel [NHANES]    Frequency of Communication with Friends and Family: Not on file    Frequency of Social Gatherings with Friends and Family: Not on file    Attends Religious Services: Not on file    Active Member of Clubs or Organizations: Not on file    Attends Banker Meetings: Not on file    Marital Status: Married  Intimate Partner Violence: Not At Risk (05/24/2021)   Humiliation, Afraid, Rape, and Kick questionnaire    Fear of Current or Ex-Partner: No    Emotionally Abused: No    Physically Abused: No    Sexually Abused: No    Family History  Problem Relation Age of Onset   Diabetes Mother    Heart disease Mother    Hypertension Mother    Stroke Mother    Prostate cancer Father    Cancer Father        prostate cancer in 60s   Diabetes Sister    Heart disease Sister    Ulcers Other        unknown who had ulcers in family       Review of systems complete and found to be negative  unless listed above      PHYSICAL EXAM  General: Well developed, well nourished, in no acute distress HEENT:  Normocephalic and atramatic Neck:  No JVD.  Lungs: Clear bilaterally to auscultation and percussion. Heart: HRRR . Normal S1 and S2 without gallops or murmurs.  Abdomen: Bowel sounds are positive, abdomen soft and non-tender  Msk:  Back normal, normal gait. Normal strength and tone for age. Extremities: No clubbing, cyanosis or edema.   Neuro: Alert and oriented X 3. Psych:  Good affect, responds appropriately  Labs:   Lab Results  Component Value Date   WBC 6.8 02/10/2022   HGB 8.6 (L) 02/10/2022   HCT 26.8 (L) 02/10/2022   MCV 88.2 02/10/2022   PLT 233 02/10/2022    Recent Labs  Lab 02/09/22 1122 02/10/22 0412  NA 138 144  K 3.6 3.6  CL 110 113*  CO2 20* 26  BUN 11 10  CREATININE 0.42* 0.70  CALCIUM 7.3* 8.1*  PROT 4.4*  --   BILITOT 0.3  --   ALKPHOS 27*  --   ALT 7  --   AST 19  --   GLUCOSE 76 84   Lab Results  Component Value Date   TROPONINI <0.03 04/21/2018    Lab Results  Component Value Date   CHOL 97 12/14/2021   CHOL 101 06/07/2021   CHOL 103 10/28/2020   Lab Results  Component Value Date   HDL 39.40 12/14/2021   HDL 41.30 06/07/2021   HDL 37.10 (L) 10/28/2020   Lab Results  Component Value Date   LDLCALC 41 12/14/2021   LDLCALC 45 06/07/2021   LDLCALC 50 10/28/2020   Lab Results  Component Value Date   TRIG 79.0 12/14/2021   TRIG 75.0 06/07/2021   TRIG 76.0 10/28/2020   Lab Results  Component Value Date   CHOLHDL 2 12/14/2021   CHOLHDL 2 06/07/2021   CHOLHDL 3 10/28/2020   No results  found for: "LDLDIRECT"    Radiology: CT Angio Chest/Abd/Pel for Dissection W and/or Wo Contrast  Result Date: 02/09/2022 CLINICAL DATA:  66 year old male with altered mental status. Weakness, malaise. Chest pain radiating to the left shoulder. History of MI. EXAM: CT ANGIOGRAPHY CHEST, ABDOMEN AND PELVIS TECHNIQUE: Non-contrast CT of  the chest was initially obtained. Multidetector CT imaging through the chest, abdomen and pelvis was performed using the standard protocol during bolus administration of intravenous contrast. Multiplanar reconstructed images and MIPs were obtained and reviewed to evaluate the vascular anatomy. RADIATION DOSE REDUCTION: This exam was performed according to the departmental dose-optimization program which includes automated exposure control, adjustment of the mA and/or kV according to patient size and/or use of iterative reconstruction technique. CONTRAST:  100mL OMNIPAQUE IOHEXOL 350 MG/ML SOLN COMPARISON:  CT Chest, Abdomen, and Pelvis 08/16/2017. CT Abdomen and Pelvis 08/21/2021. FINDINGS: CTA CHEST FINDINGS Cardiovascular: Chronic left chest AICD. Cardiomegaly appears stable since 2019. No pericardial effusion. Calcified coronary artery atherosclerosis and/or stents. Calcified aortic atherosclerosis. Negative for aortic aneurysm or dissection. No visible pulmonary artery filling defect. Mediastinum/Nodes: Negative. No mediastinal mass or lymphadenopathy. Lungs/Pleura: Chronic centrilobular and paraseptal/bullous emphysema. Lung volumes are stable since 2019. Non dependent retained secretions along the left anterolateral wall of the trachea series 7, image 51, but major airways are patent. Dependent left greater than right lung base opacity has increased and is only partially enhancing. No definite pleural fluid. But elsewhere the lungs are stable since 2019. Musculoskeletal: Chronic left anterior 4th rib fracture redemonstrated. No acute osseous abnormality identified. Review of the MIP images confirms the above findings. CTA ABDOMEN AND PELVIS FINDINGS VASCULAR Aortoiliac calcified atherosclerosis. 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 Embolization coils there which also track into the left  up to rater appears stable since the noncontrast CT this April. Other major arterial structures appear patent. No dissection identified. Tortuous abdominal aorta but no discrete abdominal aortic aneurysm. Review of the MIP images confirms the above findings. NON-VASCULAR Hepatobiliary: Liver and gallbladder appear negative. Pancreas: Negative. Spleen: Negative. Adrenals/Urinary Tract: Stable kidneys and adrenal gland since 2019. Chronic left lower pole nephrolithiasis now measures up to 9 mm. No hydronephrosis. Unremarkable bladder. Stomach/Bowel: Stool ball in the rectum increased from prior exams. Upstream large bowel retained stool and redundant sigmoid. Chronic sigmoid diverticulosis is apparent, but there is limited bowel detail due to early contrast timing and lack of oral contrast. However, the descending colon appears abnormally thickened and indistinct such as on series 5, image 125. The wall thickening appears to extend at least to the proximal sigmoid. No pneumoperitoneum identified. No definite free fluid. Upstream transverse colon is difficult to delineate and probably completely decompressed through the midline. Right: At the hepatic flexure also appears indistinct. Cecum has a more normal appearance. No dilated small bowel. Questionable visualization of the appendix. No pericecal inflammation identified. Lymphatic: No lymphadenopathy is evident. Reproductive: No acute finding. Other: Bulky but stable prostate and bilateral seminal vesicles. No convincing pelvic free fluid. Musculoskeletal: Chronic left femur ORIF. Chronic L4-L5 degeneration. No acute osseous abnormality identified. Review of the MIP images confirms the above findings. IMPRESSION: 1. Negative for aortic dissection or aneurysm. Positive for Aortic Atherosclerosis (ICD10-I70.0 and chronic left iliac artery stenting and coil embolization. 2. Suboptimal bowel detail with CTA technique but indistinct appearance of the large bowel suspicious  for a widespread Acute Colitis. Underlying diverticulosis, rectal stool ball.  No free air, free fluid, or bowel obstruction. If the diagnosis is in doubt then repeat CT Abdomen and Pelvis with oral and IV contrast would be ideal. 3. Bullous Emphysema (ICD10-J43.9). Bilateral dependent lower lobe opacity, probably atelectasis but difficult to exclude infection. No significant pleural fluid. 4. Chronic Cardiomegaly, left nephrolithiasis. Electronically Signed   By: Genevie Ann M.D.   On: 02/09/2022 14:28   CT Head Wo Contrast  Result Date: 02/09/2022 CLINICAL DATA:  Mental status change, unknown cause EXAM: CT HEAD WITHOUT CONTRAST TECHNIQUE: Contiguous axial images were obtained from the base of the skull through the vertex without intravenous contrast. RADIATION DOSE REDUCTION: This exam was performed according to the departmental dose-optimization program which includes automated exposure control, adjustment of the mA and/or kV according to patient size and/or use of iterative reconstruction technique. COMPARISON:  None Available. FINDINGS: Brain: No evidence of acute infarction, hemorrhage, hydrocephalus, extra-axial collection or mass lesion/mass effect. Vascular: No hyperdense vessel identified. Skull: No acute fracture. Sinuses/Orbits: Clear visualized sinuses. No acute orbital findings. Other: No mastoid effusions. IMPRESSION: No evidence of acute intracranial abnormality. Electronically Signed   By: Margaretha Sheffield M.D.   On: 02/09/2022 12:23   DG Chest Portable 1 View  Result Date: 02/09/2022 CLINICAL DATA:  Chest pain, weakness EXAM: PORTABLE CHEST 1 VIEW COMPARISON:  04/18/2018 FINDINGS: Lungs are hyperinflated as can be seen with COPD. Mild bilateral interstitial thickening. No focal consolidation. No pleural effusion or pneumothorax. Heart and mediastinal contours are unremarkable. No acute osseous abnormality. IMPRESSION: 1. Mild bilateral interstitial thickening which may reflect mild  interstitial edema versus infection. 2. COPD. Electronically Signed   By: Kathreen Devoid M.D.   On: 02/09/2022 11:23    EKG: Normal sinus rhythm nonspecific ST-T changes occasional PVCs  ASSESSMENT AND PLAN:  Shortness of breath Anemia Ischemic cardiomyopathy Multivessel coronary artery disease  tobacco abuse Generalized weakness Peripheral vascular disease with claudication AAA  GERD Relative hypotension . Agree with admit to telemetry follow-up EKGs troponin Commend transfusion to hemoglobin of 9 or 10 Recommend continuing PPI as a Protonix for reflux and possible gastritis Agree with GI input management evaluation Have the patient avoid BCs for pain Maintain low-dose ACE inhibitor for cardiomyopathy heart failure Continue metoprolol therapy for blood pressure support as well as heart rate management Maintain Imdur for chronic stable angina Advised patient to refrain from tobacco abuse Maintain Lipitor therapy for hyperlipidemia management and control Recommend gentle hydration until transfusion  Signed: Yolonda Kida MD 02/10/2022, 3:47 PM

## 2022-02-11 DIAGNOSIS — K5730 Diverticulosis of large intestine without perforation or abscess without bleeding: Secondary | ICD-10-CM | POA: Diagnosis present

## 2022-02-11 DIAGNOSIS — D649 Anemia, unspecified: Secondary | ICD-10-CM | POA: Diagnosis not present

## 2022-02-11 DIAGNOSIS — I25118 Atherosclerotic heart disease of native coronary artery with other forms of angina pectoris: Secondary | ICD-10-CM | POA: Diagnosis present

## 2022-02-11 DIAGNOSIS — K922 Gastrointestinal hemorrhage, unspecified: Secondary | ICD-10-CM | POA: Diagnosis not present

## 2022-02-11 DIAGNOSIS — N401 Enlarged prostate with lower urinary tract symptoms: Secondary | ICD-10-CM | POA: Diagnosis present

## 2022-02-11 DIAGNOSIS — I959 Hypotension, unspecified: Secondary | ICD-10-CM | POA: Diagnosis not present

## 2022-02-11 DIAGNOSIS — R0602 Shortness of breath: Secondary | ICD-10-CM | POA: Diagnosis not present

## 2022-02-11 DIAGNOSIS — D500 Iron deficiency anemia secondary to blood loss (chronic): Secondary | ICD-10-CM

## 2022-02-11 DIAGNOSIS — K648 Other hemorrhoids: Secondary | ICD-10-CM | POA: Diagnosis not present

## 2022-02-11 DIAGNOSIS — R338 Other retention of urine: Secondary | ICD-10-CM | POA: Diagnosis present

## 2022-02-11 DIAGNOSIS — G4700 Insomnia, unspecified: Secondary | ICD-10-CM | POA: Diagnosis present

## 2022-02-11 DIAGNOSIS — K31811 Angiodysplasia of stomach and duodenum with bleeding: Secondary | ICD-10-CM | POA: Diagnosis present

## 2022-02-11 DIAGNOSIS — K257 Chronic gastric ulcer without hemorrhage or perforation: Secondary | ICD-10-CM | POA: Diagnosis present

## 2022-02-11 DIAGNOSIS — I255 Ischemic cardiomyopathy: Secondary | ICD-10-CM | POA: Diagnosis not present

## 2022-02-11 DIAGNOSIS — K921 Melena: Secondary | ICD-10-CM | POA: Diagnosis not present

## 2022-02-11 DIAGNOSIS — K31819 Angiodysplasia of stomach and duodenum without bleeding: Secondary | ICD-10-CM | POA: Diagnosis not present

## 2022-02-11 DIAGNOSIS — D509 Iron deficiency anemia, unspecified: Secondary | ICD-10-CM | POA: Diagnosis not present

## 2022-02-11 DIAGNOSIS — Z79899 Other long term (current) drug therapy: Secondary | ICD-10-CM | POA: Diagnosis not present

## 2022-02-11 DIAGNOSIS — Z20822 Contact with and (suspected) exposure to covid-19: Secondary | ICD-10-CM | POA: Diagnosis present

## 2022-02-11 DIAGNOSIS — K3189 Other diseases of stomach and duodenum: Secondary | ICD-10-CM | POA: Diagnosis present

## 2022-02-11 DIAGNOSIS — F419 Anxiety disorder, unspecified: Secondary | ICD-10-CM | POA: Diagnosis present

## 2022-02-11 DIAGNOSIS — I110 Hypertensive heart disease with heart failure: Secondary | ICD-10-CM | POA: Diagnosis present

## 2022-02-11 DIAGNOSIS — I714 Abdominal aortic aneurysm, without rupture, unspecified: Secondary | ICD-10-CM | POA: Diagnosis present

## 2022-02-11 DIAGNOSIS — I739 Peripheral vascular disease, unspecified: Secondary | ICD-10-CM | POA: Diagnosis not present

## 2022-02-11 DIAGNOSIS — Z681 Body mass index (BMI) 19 or less, adult: Secondary | ICD-10-CM | POA: Diagnosis not present

## 2022-02-11 DIAGNOSIS — F17210 Nicotine dependence, cigarettes, uncomplicated: Secondary | ICD-10-CM | POA: Diagnosis present

## 2022-02-11 DIAGNOSIS — K2960 Other gastritis without bleeding: Secondary | ICD-10-CM | POA: Diagnosis not present

## 2022-02-11 DIAGNOSIS — F32A Depression, unspecified: Secondary | ICD-10-CM | POA: Diagnosis present

## 2022-02-11 DIAGNOSIS — K219 Gastro-esophageal reflux disease without esophagitis: Secondary | ICD-10-CM | POA: Diagnosis present

## 2022-02-11 DIAGNOSIS — I509 Heart failure, unspecified: Secondary | ICD-10-CM | POA: Diagnosis present

## 2022-02-11 DIAGNOSIS — K259 Gastric ulcer, unspecified as acute or chronic, without hemorrhage or perforation: Secondary | ICD-10-CM | POA: Diagnosis not present

## 2022-02-11 DIAGNOSIS — Z720 Tobacco use: Secondary | ICD-10-CM | POA: Diagnosis not present

## 2022-02-11 DIAGNOSIS — Z95810 Presence of automatic (implantable) cardiac defibrillator: Secondary | ICD-10-CM | POA: Diagnosis not present

## 2022-02-11 DIAGNOSIS — E785 Hyperlipidemia, unspecified: Secondary | ICD-10-CM | POA: Diagnosis not present

## 2022-02-11 DIAGNOSIS — I10 Essential (primary) hypertension: Secondary | ICD-10-CM | POA: Diagnosis not present

## 2022-02-11 HISTORY — DX: Iron deficiency anemia secondary to blood loss (chronic): D50.0

## 2022-02-11 LAB — CBC
HCT: 29.4 % — ABNORMAL LOW (ref 39.0–52.0)
Hemoglobin: 9.3 g/dL — ABNORMAL LOW (ref 13.0–17.0)
MCH: 27.8 pg (ref 26.0–34.0)
MCHC: 31.6 g/dL (ref 30.0–36.0)
MCV: 88 fL (ref 80.0–100.0)
Platelets: 262 10*3/uL (ref 150–400)
RBC: 3.34 MIL/uL — ABNORMAL LOW (ref 4.22–5.81)
RDW: 16.7 % — ABNORMAL HIGH (ref 11.5–15.5)
WBC: 7 10*3/uL (ref 4.0–10.5)
nRBC: 0 % (ref 0.0–0.2)

## 2022-02-11 LAB — BASIC METABOLIC PANEL
Anion gap: 6 (ref 5–15)
BUN: 9 mg/dL (ref 8–23)
CO2: 23 mmol/L (ref 22–32)
Calcium: 8.1 mg/dL — ABNORMAL LOW (ref 8.9–10.3)
Chloride: 111 mmol/L (ref 98–111)
Creatinine, Ser: 0.77 mg/dL (ref 0.61–1.24)
GFR, Estimated: >60 mL/min (ref >60)
Glucose, Bld: 78 mg/dL (ref 70–99)
Potassium: 3.6 mmol/L (ref 3.5–5.1)
Sodium: 140 mmol/L (ref 135–145)

## 2022-02-11 MED ORDER — SODIUM CHLORIDE 0.9 % IV SOLN
250.0000 mg | Freq: Every day | INTRAVENOUS | Status: AC
  Administered 2022-02-11 – 2022-02-12 (×2): 250 mg via INTRAVENOUS
  Filled 2022-02-11 (×2): qty 20

## 2022-02-11 MED ORDER — POLYETHYLENE GLYCOL 3350 17 GM/SCOOP PO POWD
1.0000 | Freq: Once | ORAL | Status: AC
  Administered 2022-02-11: 255 g via ORAL
  Filled 2022-02-11: qty 255

## 2022-02-11 NOTE — Progress Notes (Signed)
PROGRESS NOTE    Gordy Savers Madelin Headings.  GBT:[id number] DOB: 02-07-1956 DOA: 02/09/2022 PCP: [facility], [facility], MD   Assessment & Plan:   Principal Problem:   Symptomatic anemia Active Problems:   AAA (abdominal aortic aneurysm) (HCC)   HLD (hyperlipidemia)   Cardiomyopathy, ischemic   PAD (peripheral artery disease) (HCC)   Current tobacco use   Weight loss   Essential hypertension   Anxiety and depression   Benign prostatic hyperplasia with urinary retention   Atherosclerosis   Chest pain   Iron deficiency anemia due to chronic blood loss  Assessment and Plan: Symptomatic anemia: etiology unclear, possible GI bleed. Takes BC powder daily for headaches. Continue on IV PPI. Holding plavix, aspirin. Cardio consulted for cardiac clearance as per GI request. EGD and colonoscopy tomorrow. H&H trending up currently     HTN: continue on metoprolol, lisinopril, imdur. Hold for MAP < 65    Weight loss/underweight: continue on home dose of mirtazapine. BMI 17.7   Current tobacco use: nicotine patch to prevent w/drawl. Smoking cessation counseling x 5 mins  PAD: continue on statin. Holding aspirin, plavix   Ischemic cardiomyopathy: continue on lisinopril, metoprolol, statin, imdur. Holding aspirin, plavix  HLD: continue on statin        DVT prophylaxis: SCDs Code Status: full  Family Communication:  Disposition Plan: likely d/c back home   Level of care: Telemetry Cardiac  Status is: Observation The patient remains OBS appropriate and will d/c before 2 midnights.   Consultants:  GI   Procedures:   Antimicrobials:    Subjective: Pt c/o malaise   Objective: Vitals:   02/11/22 0425 02/11/22 0801 02/11/22 0939 02/11/22 1214  BP: 127/78 126/83  130/88  Pulse: 66 (!) 57  66  Resp: 19 16  16   Temp: 98.5 F (36.9 C) 98.3 F (36.8 C)  97.6 F (36.4 C)  TempSrc:  Oral    SpO2: 93% 94% 95% 100%  Weight:      Height:        Intake/Output Summary  (Last 24 hours) at 02/11/2022 1302 Last data filed at 02/11/2022 1200 Gross per 24 hour  Intake --  Output 1000 ml  Net -1000 ml    Filed Weights   02/09/22 1056  Weight: 54.4 kg    Examination:  General exam: Appears calm & comfortable. Frail appearing  Respiratory system: clear breath sounds b/l  Cardiovascular system: S1/S2+. No rubs or clicks  Gastrointestinal system: Abd is soft, NT, ND & hypoactive bowel sounds  Central nervous system: Alert and oriented. Moves all extremities  Psychiatry: judgement and insight appears at baseline. Flat mood and affect     Data Reviewed: I have personally reviewed following labs and imaging studies  CBC: Recent Labs  Lab 02/09/22 1122 02/09/22 1123 02/09/22 1631 02/10/22 0412 02/11/22 0819  WBC 7.9 5.0 7.1 6.8 7.0  HGB 8.3* 8.4* 8.6* 8.6* 9.3*  HCT 26.3* 26.7* 27.2* 26.8* 29.4*  MCV 89.5 89.6 88.6 88.2 88.0  PLT 220 231 234 233 262   Basic Metabolic Panel: Recent Labs  Lab 02/09/22 1122 02/10/22 0412 02/11/22 0819  NA 138 144 140  K 3.6 3.6 3.6  CL 110 113* 111  CO2 20* 26 23  GLUCOSE 76 84 78  BUN 11 10 9   CREATININE 0.42* 0.70 0.77  CALCIUM 7.3* 8.1* 8.1*   GFR: Estimated Creatinine Clearance: 68.9 mL/min (by C-G formula based on SCr of 0.77 mg/dL). Liver Function Tests: Recent Labs  Lab 02/09/22 1122  AST 19  ALT 7  ALKPHOS 27*  BILITOT 0.3  PROT 4.4*  ALBUMIN 2.4*   No results for input(s): "LIPASE", "AMYLASE" in the last 168 hours. No results for input(s): "AMMONIA" in the last 168 hours. Coagulation Profile: No results for input(s): "INR", "PROTIME" in the last 168 hours. Cardiac Enzymes: No results for input(s): "CKTOTAL", "CKMB", "CKMBINDEX", "TROPONINI" in the last 168 hours. BNP (last 3 results) No results for input(s): "PROBNP" in the last 8760 hours. HbA1C: No results for input(s): "HGBA1C" in the last 72 hours. CBG: No results for input(s): "GLUCAP" in the last 168 hours. Lipid  Profile: No results for input(s): "CHOL", "HDL", "LDLCALC", "TRIG", "CHOLHDL", "LDLDIRECT" in the last 72 hours. Thyroid Function Tests: No results for input(s): "TSH", "T4TOTAL", "FREET4", "T3FREE", "THYROIDAB" in the last 72 hours. Anemia Panel: Recent Labs    02/10/22 1406  VITAMINB12 237  FOLATE 8.7  FERRITIN 7*  TIBC 280  IRON 19*   Sepsis Labs: No results for input(s): "PROCALCITON", "LATICACIDVEN" in the last 168 hours.  Recent Results (from the past 240 hour(s))  Resp Panel by RT-PCR (Flu A&B, Covid) Anterior Nasal Swab     Status: None   Collection Time: 02/09/22 12:03 PM   Specimen: Anterior Nasal Swab  Result Value Ref Range Status   SARS Coronavirus 2 by RT PCR NEGATIVE NEGATIVE Final    Comment: (NOTE) SARS-CoV-2 target nucleic acids are NOT DETECTED.  The SARS-CoV-2 RNA is generally detectable in upper respiratory specimens during the acute phase of infection. The lowest concentration of SARS-CoV-2 viral copies this assay can detect is 138 copies/mL. A negative result does not preclude SARS-Cov-2 infection and should not be used as the sole basis for treatment or other patient management decisions. A negative result may occur with  improper specimen collection/handling, submission of specimen other than nasopharyngeal swab, presence of viral mutation(s) within the areas targeted by this assay, and inadequate number of viral copies(<138 copies/mL). A negative result must be combined with clinical observations, patient history, and epidemiological information. The expected result is Negative.  Fact Sheet for Patients:  BloggerCourse.com  Fact Sheet for Healthcare Providers:  SeriousBroker.it  This test is no t yet approved or cleared by the [place] FDA and  has been authorized for detection and/or diagnosis of SARS-CoV-2 by FDA under an Emergency Use Authorization (EUA). This EUA will remain  in effect  (meaning this test can be used) for the duration of the COVID-19 declaration under Section 564(b)(1) of the Act, 21 U.S.C.section 360bbb-3(b)(1), unless the authorization is terminated  or revoked sooner.       Influenza A by PCR NEGATIVE NEGATIVE Final   Influenza B by PCR NEGATIVE NEGATIVE Final    Comment: (NOTE) The Xpert Xpress SARS-CoV-2/FLU/RSV plus assay is intended as an aid in the diagnosis of influenza from Nasopharyngeal swab specimens and should not be used as a sole basis for treatment. Nasal washings and aspirates are unacceptable for Xpert Xpress SARS-CoV-2/FLU/RSV testing.  Fact Sheet for Patients: BloggerCourse.com  Fact Sheet for Healthcare Providers: SeriousBroker.it  This test is not yet approved or cleared by the [place] FDA and has been authorized for detection and/or diagnosis of SARS-CoV-2 by FDA under an Emergency Use Authorization (EUA). This EUA will remain in effect (meaning this test can be used) for the duration of the COVID-19 declaration under Section 564(b)(1) of the Act, 21 U.S.C. section 360bbb-3(b)(1), unless the authorization is terminated or revoked.  Performed at [facility], [address] [place], [place] [address]          Radiology Studies: CT Angio Chest/Abd/Pel for Dissection W and/or Wo Contrast  Result Date: 02/09/2022 CLINICAL DATA:  66 year old male with altered mental status. Weakness, malaise. Chest pain radiating to the left shoulder. History of MI. EXAM: CT ANGIOGRAPHY CHEST, ABDOMEN AND PELVIS TECHNIQUE: Non-contrast CT of the chest was initially obtained. Multidetector CT imaging through the chest, abdomen and pelvis was performed using the standard protocol during bolus administration of intravenous contrast. Multiplanar reconstructed images and MIPs were obtained and reviewed to evaluate the vascular anatomy. RADIATION DOSE REDUCTION: This exam was  performed according to the departmental dose-optimization program which includes automated exposure control, adjustment of the mA and/or kV according to patient size and/or use of iterative reconstruction technique. CONTRAST:  186mL OMNIPAQUE IOHEXOL 350 MG/ML SOLN COMPARISON:  CT Chest, Abdomen, and Pelvis 08/16/2017. CT Abdomen and Pelvis 08/21/2021. FINDINGS: CTA CHEST FINDINGS Cardiovascular: Chronic left chest AICD. Cardiomegaly appears stable since 2019. No pericardial effusion. Calcified coronary artery atherosclerosis and/or stents. Calcified aortic atherosclerosis. Negative for aortic aneurysm or dissection. No visible pulmonary artery filling defect. Mediastinum/Nodes: Negative. No mediastinal mass or lymphadenopathy. Lungs/Pleura: Chronic centrilobular and paraseptal/bullous emphysema. Lung volumes are stable since 2019. Non dependent retained secretions along the left anterolateral wall of the trachea series 7, image 51, but major airways are patent. Dependent left greater than right lung base opacity has increased and is only partially enhancing. No definite pleural fluid. But elsewhere the lungs are stable since 2019. Musculoskeletal: Chronic left anterior 4th rib fracture redemonstrated. No acute osseous abnormality identified. Review of the MIP images confirms the above findings. CTA ABDOMEN AND PELVIS FINDINGS VASCULAR Aortoiliac calcified atherosclerosis. 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 Embolization coils there which also track into the left up to rater appears stable since the noncontrast CT this April. Other major arterial structures appear patent. No dissection identified. Tortuous abdominal aorta but no discrete abdominal aortic aneurysm. Review of the MIP images confirms the above findings. NON-VASCULAR Hepatobiliary: Liver and gallbladder appear negative. Pancreas:  Negative. Spleen: Negative. Adrenals/Urinary Tract: Stable kidneys and adrenal gland since 2019. Chronic left lower pole nephrolithiasis now measures up to 9 mm. No hydronephrosis. Unremarkable bladder. Stomach/Bowel: Stool ball in the rectum increased from prior exams. Upstream large bowel retained stool and redundant sigmoid. Chronic sigmoid diverticulosis is apparent, but there is limited bowel detail due to early contrast timing and lack of oral contrast. However, the descending colon appears abnormally thickened and indistinct such as on series 5, image 125. The wall thickening appears to extend at least to the proximal sigmoid. No pneumoperitoneum identified. No definite free fluid. Upstream transverse colon is difficult to delineate and probably completely decompressed through the midline. Right: At the hepatic flexure also appears indistinct. Cecum has a more normal appearance. No dilated small bowel. Questionable visualization of the appendix. No pericecal inflammation identified. Lymphatic: No lymphadenopathy is evident. Reproductive: No acute finding. Other: Bulky but stable prostate and bilateral seminal vesicles. No convincing pelvic free fluid. Musculoskeletal: Chronic left femur ORIF. Chronic L4-L5 degeneration. No acute osseous abnormality identified. Review of the MIP images confirms the above findings. IMPRESSION: 1. Negative for aortic dissection or aneurysm. Positive for Aortic Atherosclerosis (ICD10-I70.0 and chronic left iliac artery stenting and coil embolization. 2. Suboptimal bowel detail with CTA technique but indistinct appearance of the large bowel suspicious for a  widespread Acute Colitis. Underlying diverticulosis, rectal stool ball. No free air, free fluid, or bowel obstruction. If the diagnosis is in doubt then repeat CT Abdomen and Pelvis with oral and IV contrast would be ideal. 3. Bullous Emphysema (ICD10-J43.9). Bilateral dependent lower lobe opacity, probably atelectasis but  difficult to exclude infection. No significant pleural fluid. 4. Chronic Cardiomegaly, left nephrolithiasis. Electronically Signed   By: Odessa Fleming M.D.   On: 02/09/2022 14:28        Scheduled Meds:  acetaminophen  650 mg Oral Q8H   atorvastatin  80 mg Oral Daily   isosorbide mononitrate  60 mg Oral TID   lisinopril  2.5 mg Oral Daily   metoprolol succinate  25 mg Oral Daily   mirtazapine  30 mg Oral QHS   [START ON 02/13/2022] pantoprazole  40 mg Intravenous Q12H   pyridOXINE  100 mg Oral Daily   Continuous Infusions:  ferric gluconate (FERRLECIT) IVPB     pantoprazole 8 mg/hr (02/10/22 2320)     LOS: 0 days    Time spent: 30 mins     Charise Killian, MD [facility] Pager [phone]  If 7PM-7AM, please contact night-coverage [email or web address] 02/11/2022, 1:02 PM

## 2022-02-11 NOTE — Progress Notes (Signed)
Martin Repressohini R Martin Seawright, MD [address]  [address]  [place], [place] [address]  Main: [phone]  Fax: [phone] Pager: [phone]   Subjective: No acute events overnight, he denies any abdominal pain, nausea or vomiting   Objective: Vital signs in last 24 hours: Vitals:   02/11/22 0425 02/11/22 0801 02/11/22 0939 02/11/22 1214  BP: 127/78 126/83  130/88  Pulse: 66 (!) 57  66  Resp: 19 16  16   Temp: 98.5 F (36.9 C) 98.3 F (36.8 C)  97.6 F (36.4 C)  TempSrc:  Oral    SpO2: 93% 94% 95% 100%  Weight:      Height:       Weight change:   Intake/Output Summary (Last 24 hours) at 02/11/2022 1245 Last data filed at 02/11/2022 1200 Gross per 24 hour  Intake --  Output 1000 ml  Net -1000 ml     Exam: Heart:: Regular rate and rhythm, S1S2 present, or without murmur or extra heart sounds Lungs: normal and clear to auscultation Abdomen: soft, nontender, normal bowel sounds  Lab Results: CBC    Component Value Date/Time   WBC 7.0 02/11/2022 0819   RBC 3.34 (L) 02/11/2022 0819   HGB 9.3 (L) 02/11/2022 0819   HGB 14.8 01/10/2019 0945   HCT 29.4 (L) 02/11/2022 0819   HCT 42.9 01/10/2019 0945   PLT 262 02/11/2022 0819   PLT 285 01/10/2019 0945   MCV 88.0 02/11/2022 0819   MCV 92 01/10/2019 0945   MCH 27.8 02/11/2022 0819   MCHC 31.6 02/11/2022 0819   RDW 16.7 (H) 02/11/2022 0819   RDW 13.2 01/10/2019 0945   LYMPHSABS 2.0 12/14/2021 0839   LYMPHSABS 2.2 01/10/2019 0945   MONOABS 0.7 12/14/2021 0839   EOSABS 0.1 12/14/2021 0839   EOSABS 0.1 01/10/2019 0945   BASOSABS 0.0 12/14/2021 0839   BASOSABS 0.0 01/10/2019 0945      Latest Ref Rng & Units 02/11/2022    8:19 AM 02/10/2022    4:12 AM 02/09/2022   11:22 AM  CMP  Glucose 70 - 99 mg/dL 78  84  76   BUN 8 - 23 mg/dL 9  10  11    Creatinine 0.61 - 1.24 mg/dL 9.620.77  9.520.70  8.410.42   Sodium 135 - 145 mmol/L 140  144  138   Potassium 3.5 - 5.1 mmol/L 3.6  3.6  3.6   Chloride 98 - 111 mmol/L 111  113  110    CO2 22 - 32 mmol/L 23  26  20    Calcium 8.9 - 10.3 mg/dL 8.1  8.1  7.3   Total Protein 6.5 - 8.1 g/dL   4.4   Total Bilirubin 0.3 - 1.2 mg/dL   0.3   Alkaline Phos 38 - 126 U/L   27   AST 15 - 41 U/L   19   ALT 0 - 44 U/L   7     Micro Results: Recent Results (from the past 240 hour(s))  Resp Panel by RT-PCR (Flu A&B, Covid) Anterior Nasal Swab     Status: None   Collection Time: 02/09/22 12:03 PM   Specimen: Anterior Nasal Swab  Result Value Ref Range Status   SARS Coronavirus 2 by RT PCR NEGATIVE NEGATIVE Final    Comment: (NOTE) SARS-CoV-2 target nucleic acids are NOT DETECTED.  The SARS-CoV-2 RNA is generally detectable in upper respiratory specimens during the acute phase of infection. The lowest concentration of SARS-CoV-2 viral copies this assay  can detect is 138 copies/mL. A negative result does not preclude SARS-Cov-2 infection and should not be used as the sole basis for treatment or other patient management decisions. A negative result may occur with  improper specimen collection/handling, submission of specimen other than nasopharyngeal swab, presence of viral mutation(s) within the areas targeted by this assay, and inadequate number of viral copies(<138 copies/mL). A negative result must be combined with clinical observations, patient history, and epidemiological information. The expected result is Negative.  Fact Sheet for Patients:  EntrepreneurPulse.com.au  Fact Sheet for Healthcare Providers:  IncredibleEmployment.be  This test is no t yet approved or cleared by the [place] FDA and  has been authorized for detection and/or diagnosis of SARS-CoV-2 by FDA under an Emergency Use Authorization (EUA). This EUA will remain  in effect (meaning this test can be used) for the duration of the COVID-19 declaration under Section 564(b)(1) of the Act, 21 U.S.C.section 360bbb-3(b)(1), unless the authorization is terminated  or  revoked sooner.       Influenza A by PCR NEGATIVE NEGATIVE Final   Influenza B by PCR NEGATIVE NEGATIVE Final    Comment: (NOTE) The Xpert Xpress SARS-CoV-2/FLU/RSV plus assay is intended as an aid in the diagnosis of influenza from Nasopharyngeal swab specimens and should not be used as a sole basis for treatment. Nasal washings and aspirates are unacceptable for Xpert Xpress SARS-CoV-2/FLU/RSV testing.  Fact Sheet for Patients: EntrepreneurPulse.com.au  Fact Sheet for Healthcare Providers: IncredibleEmployment.be  This test is not yet approved or cleared by the [place] FDA and has been authorized for detection and/or diagnosis of SARS-CoV-2 by FDA under an Emergency Use Authorization (EUA). This EUA will remain in effect (meaning this test can be used) for the duration of the COVID-19 declaration under Section 564(b)(1) of the Act, 21 U.S.C. section 360bbb-3(b)(1), unless the authorization is terminated or revoked.  Performed at [facility], [address], [place], [place] [address]    Studies/Results: CT Angio Chest/Abd/Pel for Dissection W and/or Wo Contrast  Result Date: 02/09/2022 CLINICAL DATA:  66 year old male with altered mental status. Weakness, malaise. Chest pain radiating to the left shoulder. History of MI. EXAM: CT ANGIOGRAPHY CHEST, ABDOMEN AND PELVIS TECHNIQUE: Non-contrast CT of the chest was initially obtained. Multidetector CT imaging through the chest, abdomen and pelvis was performed using the standard protocol during bolus administration of intravenous contrast. Multiplanar reconstructed images and MIPs were obtained and reviewed to evaluate the vascular anatomy. RADIATION DOSE REDUCTION: This exam was performed according to the departmental dose-optimization program which includes automated exposure control, adjustment of the mA and/or kV according to patient size and/or use of iterative reconstruction  technique. CONTRAST:  167mL OMNIPAQUE IOHEXOL 350 MG/ML SOLN COMPARISON:  CT Chest, Abdomen, and Pelvis 08/16/2017. CT Abdomen and Pelvis 08/21/2021. FINDINGS: CTA CHEST FINDINGS Cardiovascular: Chronic left chest AICD. Cardiomegaly appears stable since 2019. No pericardial effusion. Calcified coronary artery atherosclerosis and/or stents. Calcified aortic atherosclerosis. Negative for aortic aneurysm or dissection. No visible pulmonary artery filling defect. Mediastinum/Nodes: Negative. No mediastinal mass or lymphadenopathy. Lungs/Pleura: Chronic centrilobular and paraseptal/bullous emphysema. Lung volumes are stable since 2019. Non dependent retained secretions along the left anterolateral wall of the trachea series 7, image 51, but major airways are patent. Dependent left greater than right lung base opacity has increased and is only partially enhancing. No definite pleural fluid. But elsewhere the lungs are stable since 2019. Musculoskeletal: Chronic left anterior 4th rib fracture redemonstrated. No acute osseous abnormality identified. Review of the  MIP images confirms the above findings. CTA ABDOMEN AND PELVIS FINDINGS VASCULAR Aortoiliac calcified atherosclerosis. 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 Embolization coils there which also track into the left up to rater appears stable since the noncontrast CT this April. Other major arterial structures appear patent. No dissection identified. Tortuous abdominal aorta but no discrete abdominal aortic aneurysm. Review of the MIP images confirms the above findings. NON-VASCULAR Hepatobiliary: Liver and gallbladder appear negative. Pancreas: Negative. Spleen: Negative. Adrenals/Urinary Tract: Stable kidneys and adrenal gland since 2019. Chronic left lower pole nephrolithiasis now measures up to 9 mm. No hydronephrosis. Unremarkable bladder.  Stomach/Bowel: Stool ball in the rectum increased from prior exams. Upstream large bowel retained stool and redundant sigmoid. Chronic sigmoid diverticulosis is apparent, but there is limited bowel detail due to early contrast timing and lack of oral contrast. However, the descending colon appears abnormally thickened and indistinct such as on series 5, image 125. The wall thickening appears to extend at least to the proximal sigmoid. No pneumoperitoneum identified. No definite free fluid. Upstream transverse colon is difficult to delineate and probably completely decompressed through the midline. Right: At the hepatic flexure also appears indistinct. Cecum has a more normal appearance. No dilated small bowel. Questionable visualization of the appendix. No pericecal inflammation identified. Lymphatic: No lymphadenopathy is evident. Reproductive: No acute finding. Other: Bulky but stable prostate and bilateral seminal vesicles. No convincing pelvic free fluid. Musculoskeletal: Chronic left femur ORIF. Chronic L4-L5 degeneration. No acute osseous abnormality identified. Review of the MIP images confirms the above findings. IMPRESSION: 1. Negative for aortic dissection or aneurysm. Positive for Aortic Atherosclerosis (ICD10-I70.0 and chronic left iliac artery stenting and coil embolization. 2. Suboptimal bowel detail with CTA technique but indistinct appearance of the large bowel suspicious for a widespread Acute Colitis. Underlying diverticulosis, rectal stool ball. No free air, free fluid, or bowel obstruction. If the diagnosis is in doubt then repeat CT Abdomen and Pelvis with oral and IV contrast would be ideal. 3. Bullous Emphysema (ICD10-J43.9). Bilateral dependent lower lobe opacity, probably atelectasis but difficult to exclude infection. No significant pleural fluid. 4. Chronic Cardiomegaly, left nephrolithiasis. Electronically Signed   By: Martin Hernandez M.D.   On: 02/09/2022 14:28   Medications: I have reviewed  the patient's current medications. Prior to Admission:  Medications Prior to Admission  Medication Sig Dispense Refill Last Dose   alfuzosin (UROXATRAL) 10 MG 24 hr tablet Take 1 tablet (10 mg total) by mouth daily with breakfast. Dc flomax 0.4 90 tablet 3 02/09/2022   aspirin 81 MG tablet Take 81 mg by mouth daily.   02/09/2022   atorvastatin (LIPITOR) 80 MG tablet Take 1 tablet (80 mg total) by mouth daily at 6pm. 90 tablet 3 02/08/2022   Baclofen 5 MG TABS Take 1 tablet by mouth twice daily. 180 tablet 3 02/09/2022   clopidogrel (PLAVIX) 75 MG tablet 1 pill daily 90 tablet 3 02/09/2022   furosemide (LASIX) 20 MG tablet Take 20 mg by mouth daily.   02/09/2022   gabapentin (NEURONTIN) 300 MG capsule Take 300 mg by mouth 3 (three) times daily.   02/09/2022 at 1200   isosorbide mononitrate (IMDUR) 60 MG 24 hr tablet Take 1 tablet by mouth in the morning, at noon, and at bedtime.   02/09/2022   levocetirizine (XYZAL) 5 MG tablet Take 1 tablet by mouth every evening. 90 tablet 1 02/08/2022  lisinopril (ZESTRIL) 2.5 MG tablet Take 2.5 mg by mouth daily.   02/09/2022   metoprolol succinate (TOPROL-XL) 25 MG 24 hr tablet Take 1 tablet (25 mg total) by mouth daily. 90 tablet 3 02/09/2022   mirtazapine (REMERON) 30 MG tablet Take 1 tablet by mouth nightly. 90 tablet 2 02/08/2022   oxyCODONE-acetaminophen (PERCOCET) 10-325 MG tablet Take 1 tablet by mouth as needed.   02/09/2022 at 0600   pantoprazole (PROTONIX) 40 MG tablet Take 1 tablet by mouth daily 30 minutes before food. 90 tablet 3 02/09/2022   pyridOXINE (VITAMIN B-6) 100 MG tablet Take 1 tablet (100 mg total) by mouth daily. 90 tablet 3 02/09/2022   zolpidem (AMBIEN) 10 MG tablet Take 1 tablet (10 mg total) by mouth at bedtime as needed. for sleep 90 tablet 1 02/08/2022   Buprenorphine HCl (BELBUCA) 600 MCG FILM Place inside cheek 2 (two) times daily. (Patient not taking: Reported on 02/09/2022)   Not Taking   ipratropium (ATROVENT) 0.02 % nebulizer solution  Inhale into the lungs.      nitroGLYCERIN (NITROSTAT) 0.4 MG SL tablet Place 1 tablet (0.4 mg total) under the tongue every 5 (five) minutes as needed for chest pain. 90 tablet 3 1030   tamsulosin (FLOMAX) 0.4 MG CAPS capsule  (Patient not taking: Reported on 02/09/2022)   Not Taking   thiamine (VITAMIN B-1) 100 MG tablet Take 100 mg by mouth daily. (Patient not taking: Reported on 02/09/2022)   Not Taking   Scheduled:  acetaminophen  650 mg Oral Q8H   atorvastatin  80 mg Oral Daily   isosorbide mononitrate  60 mg Oral TID   lisinopril  2.5 mg Oral Daily   metoprolol succinate  25 mg Oral Daily   mirtazapine  30 mg Oral QHS   [START ON 02/13/2022] pantoprazole  40 mg Intravenous Q12H   pyridOXINE  100 mg Oral Daily   Continuous:  ferric gluconate (FERRLECIT) IVPB     pantoprazole 8 mg/hr (02/10/22 2320)   EVO:[id number] **OR** acetaminophen, ipratropium, nicotine, nitroGLYCERIN, ondansetron **OR** ondansetron (ZOFRAN) IV, oxyCODONE **AND** acetaminophen, senna-docusate Anti-infectives (From admission, onward)    None      Scheduled Meds:  acetaminophen  650 mg Oral Q8H   atorvastatin  80 mg Oral Daily   isosorbide mononitrate  60 mg Oral TID   lisinopril  2.5 mg Oral Daily   metoprolol succinate  25 mg Oral Daily   mirtazapine  30 mg Oral QHS   [START ON 02/13/2022] pantoprazole  40 mg Intravenous Q12H   pyridOXINE  100 mg Oral Daily   Continuous Infusions:  ferric gluconate (FERRLECIT) IVPB     pantoprazole 8 mg/hr (02/10/22 2320)   PRN Meds:.acetaminophen **OR** acetaminophen, ipratropium, nicotine, nitroGLYCERIN, ondansetron **OR** ondansetron (ZOFRAN) IV, oxyCODONE **AND** acetaminophen, senna-docusate   Assessment: Principal Problem:   Symptomatic anemia Active Problems:   AAA (abdominal aortic aneurysm) (HCC)   HLD (hyperlipidemia)   Cardiomyopathy, ischemic   PAD (peripheral artery disease) (HCC)   Current tobacco use   Weight loss   Essential hypertension    Anxiety and depression   Benign prostatic hyperplasia with urinary retention   Atherosclerosis   Chest pain  Martin Hernandez. is a 66 y.o. male with chronic back pain, history of coronary artery disease, MI, AICD, peripheral artery disease, chronic tobacco use, severe vasculopathy, on DAPT presented with severe symptomatic anemia with no evidence of active GI bleed, history of chronic BC powder use for headaches   Plan: Symptomatic  anemia with no evidence of active GI bleed in setting of DAPT and long-term NSAID use Patient's hemoglobin has been stable since admission Iron studies revealed severe iron deficiency, recommend IV iron, ordered Recommend daily B12 supplements Okay to continue IV PPI Patient has been started on clear liquid diet today Patient's last dose of Plavix was 11/2, therefore would not be able to proceed with endoscopic evaluation for 72 hours which will be on Sunday, with possible video capsule endoscopy if EGD and colonoscopy are unremarkable Awaiting cardiology clearance given patient's history of extensive coronary artery disease before proceeding with endoscopic evaluation.  Once cleared by cardiology, recommend to start bowel prep and n.p.o. effective 5 AM tomorrow Avoid NSAID use including BC powder, Goody powder    LOS: 0 days   Martin Hernandez 02/11/2022, 12:45 PM

## 2022-02-11 NOTE — Plan of Care (Signed)

## 2022-02-12 ENCOUNTER — Inpatient Hospital Stay: Payer: Medicare Other | Admitting: Anesthesiology

## 2022-02-12 ENCOUNTER — Encounter
Admission: EM | Discharge status: Against Medical Advice | Payer: Self-pay | Source: Home / Self Care | Attending: Internal Medicine

## 2022-02-12 DIAGNOSIS — K259 Gastric ulcer, unspecified as acute or chronic, without hemorrhage or perforation: Secondary | ICD-10-CM

## 2022-02-12 DIAGNOSIS — K3189 Other diseases of stomach and duodenum: Secondary | ICD-10-CM

## 2022-02-12 DIAGNOSIS — K922 Gastrointestinal hemorrhage, unspecified: Secondary | ICD-10-CM

## 2022-02-12 DIAGNOSIS — I998 Other disorder of circulatory system: Secondary | ICD-10-CM

## 2022-02-12 DIAGNOSIS — K31819 Angiodysplasia of stomach and duodenum without bleeding: Secondary | ICD-10-CM

## 2022-02-12 HISTORY — DX: Gastric ulcer, unspecified as acute or chronic, without hemorrhage or perforation: K25.9

## 2022-02-12 HISTORY — PX: ESOPHAGOGASTRODUODENOSCOPY: SHX5428

## 2022-02-12 HISTORY — PX: COLONOSCOPY WITH PROPOFOL: SHX5780

## 2022-02-12 LAB — CBC
HCT: 32.2 % — ABNORMAL LOW (ref 39.0–52.0)
Hemoglobin: 10.2 g/dL — ABNORMAL LOW (ref 13.0–17.0)
MCH: 27.5 pg (ref 26.0–34.0)
MCHC: 31.7 g/dL (ref 30.0–36.0)
MCV: 86.8 fL (ref 80.0–100.0)
Platelets: 275 10*3/uL (ref 150–400)
RBC: 3.71 MIL/uL — ABNORMAL LOW (ref 4.22–5.81)
RDW: 16.2 % — ABNORMAL HIGH (ref 11.5–15.5)
WBC: 5.8 10*3/uL (ref 4.0–10.5)
nRBC: 0 % (ref 0.0–0.2)

## 2022-02-12 LAB — BASIC METABOLIC PANEL
Anion gap: 9 (ref 5–15)
BUN: 8 mg/dL (ref 8–23)
CO2: 26 mmol/L (ref 22–32)
Calcium: 8.3 mg/dL — ABNORMAL LOW (ref 8.9–10.3)
Chloride: 105 mmol/L (ref 98–111)
Creatinine, Ser: 0.78 mg/dL (ref 0.61–1.24)
GFR, Estimated: >60 mL/min (ref >60)
Glucose, Bld: 122 mg/dL — ABNORMAL HIGH (ref 70–99)
Potassium: 3.1 mmol/L — ABNORMAL LOW (ref 3.5–5.1)
Sodium: 140 mmol/L (ref 135–145)

## 2022-02-12 SURGERY — EGD (ESOPHAGOGASTRODUODENOSCOPY)
Anesthesia: General

## 2022-02-12 MED ORDER — POTASSIUM CHLORIDE CRYS ER 20 MEQ PO TBCR
40.0000 meq | EXTENDED_RELEASE_TABLET | Freq: Two times a day (BID) | ORAL | Status: DC
  Filled 2022-02-12: qty 2

## 2022-02-12 MED ORDER — SODIUM CHLORIDE 0.9 % IV SOLN: INTRAVENOUS | Status: DC

## 2022-02-12 MED ORDER — LIDOCAINE 2% (20 MG/ML) 5 ML SYRINGE
INTRAMUSCULAR | Status: DC | PRN
  Administered 2022-02-12: 50 mg via INTRAVENOUS

## 2022-02-12 MED ORDER — PROPOFOL 10 MG/ML IV BOLUS
INTRAVENOUS | Status: DC | PRN
  Administered 2022-02-12: 40 mg via INTRAVENOUS
  Administered 2022-02-12: 60 mg via INTRAVENOUS

## 2022-02-12 MED ORDER — PROPOFOL 500 MG/50ML IV EMUL
INTRAVENOUS | Status: DC | PRN
  Administered 2022-02-12: 150 ug/kg/min via INTRAVENOUS

## 2022-02-12 MED ORDER — LACTATED RINGERS IV SOLN: INTRAVENOUS | Status: DC | PRN

## 2022-02-12 NOTE — Transfer of Care (Signed)
Immediate Anesthesia Transfer of Care Note  Patient: Martin Hernandez.  Procedure(s) Performed: ESOPHAGOGASTRODUODENOSCOPY (EGD) COLONOSCOPY WITH PROPOFOL  Patient Location: PACU  Anesthesia Type:General  Level of Consciousness: awake  Airway & Oxygen Therapy: Patient Spontanous Breathing  Post-op Assessment: Report given to RN and Post -op Vital signs reviewed and stable  Post vital signs: Reviewed and stable  Last Vitals:  Vitals Value Taken Time  BP 100/69   Temp 97.21f   Pulse 60   Resp 12   SpO2 98     Last Pain:  Vitals:   02/12/22 1256  TempSrc: Temporal  PainSc:       Patients Stated Pain Goal: 0 (02/58/52 7782)  Complications: No notable events documented.

## 2022-02-12 NOTE — Progress Notes (Signed)
PROGRESS NOTE    Martin Hernandez.  YKZ:[id number] DOB: 12/16/1955 DOA: 02/09/2022 PCP: [facility], MD   Assessment & Plan:   Principal Problem:   Symptomatic anemia Active Problems:   AAA (abdominal aortic aneurysm) (HCC)   HLD (hyperlipidemia)   Cardiomyopathy, ischemic   PAD (peripheral artery disease) (HCC)   Current tobacco use   Weight loss   Essential hypertension   Anxiety and depression   Benign prostatic hyperplasia with urinary retention   Atherosclerosis   Chest pain   Iron deficiency anemia due to chronic blood loss  Assessment and Plan: Symptomatic anemia: etiology unclear, possible GI bleed. Takes BC powder daily for headaches.  Continue on IV PPI. Holding aspirin, plavix. S/p EGD which showed single non-bleeding angioectasia in the duodenum, treated w/ APC, erosive gastropathy w/ no bleeding (which was biopsied), white nummular lesions in esophageal mucosa (which was biopsied). H&H are trending up currently     HTN: continue on imdur, metoprolol, lisinopril. Hold for MAP < 65   Weight loss/underweight: continue on home dose of mirtazapine. BMI 17.7.   Current tobacco use: nicotine patch to prevent w/drawl. Smoking cessation counseling x 73mins   PAD: continue on statin. Holding plavix, aspirin.    Ischemic cardiomyopathy: continue on imdur, metoprolol, lisinopril, statin. Holding plavix, aspirin   HLD: continue on statin        DVT prophylaxis: SCDs Code Status: full  Family Communication: discussed pt's care w/ pt's family at bedside and answered their questions Disposition Plan: likely d/c back home   Level of care: Telemetry Cardiac  Status is: Observation The patient remains OBS appropriate and will d/c before 2 midnights.   Consultants:  GI   Procedures:   Antimicrobials:    Subjective: Pt c/o being hungry    Objective: Vitals:   02/11/22 1702 02/11/22 2053 02/12/22 0049 02/12/22 0330  BP: (!) 130/95 132/84  (!) 90/56 133/78  Pulse: (!) 57 (!) 57 (!) 57 61  Resp: 16 19 19 18   Temp: 98.4 F (36.9 C) 98.3 F (36.8 C) 98.3 F (36.8 C) 98.2 F (36.8 C)  TempSrc:      SpO2: 98% 95% 95% 94%  Weight:      Height:        Intake/Output Summary (Last 24 hours) at 02/12/2022 0756 Last data filed at 02/11/2022 2300 Gross per 24 hour  Intake 678.88 ml  Output 1000 ml  Net -321.12 ml    Filed Weights   02/09/22 1056  Weight: 54.4 kg    Examination:  General exam: Appears comfortable. Frail appearing  Respiratory system: clear breath sounds b/l  Cardiovascular system: S1 & S2+. No rubs or gallops  Gastrointestinal system: Abd is soft, NT, ND & hypoactive bowel sounds  Central nervous system: Alert and oriented. Moves all extremities  Psychiatry: judgement and insight appears at baseline. Flat mood and affect    Data Reviewed: I have personally reviewed following labs and imaging studies  CBC: Recent Labs  Lab 02/09/22 1123 02/09/22 1631 02/10/22 0412 02/11/22 0819 02/12/22 0446  WBC 5.0 7.1 6.8 7.0 5.8  HGB 8.4* 8.6* 8.6* 9.3* 10.2*  HCT 26.7* 27.2* 26.8* 29.4* 32.2*  MCV 89.6 88.6 88.2 88.0 86.8  PLT 231 234 233 262 939   Basic Metabolic Panel: Recent Labs  Lab 02/09/22 1122 02/10/22 0412 02/11/22 0819 02/12/22 0446  NA 138 144 140 140  K 3.6 3.6 3.6 3.1*  CL 110 113* 111 105  CO2 20* 26 23 26  GLUCOSE 76 84 78 122*  BUN 11 10 9 8   CREATININE 0.42* 0.70 0.77 0.78  CALCIUM 7.3* 8.1* 8.1* 8.3*   GFR: Estimated Creatinine Clearance: 68.9 mL/min (by C-G formula based on SCr of 0.78 mg/dL). Liver Function Tests: Recent Labs  Lab 02/09/22 1122  AST 19  ALT 7  ALKPHOS 27*  BILITOT 0.3  PROT 4.4*  ALBUMIN 2.4*   No results for input(s): "LIPASE", "AMYLASE" in the last 168 hours. No results for input(s): "AMMONIA" in the last 168 hours. Coagulation Profile: No results for input(s): "INR", "PROTIME" in the last 168 hours. Cardiac Enzymes: No results for  input(s): "CKTOTAL", "CKMB", "CKMBINDEX", "TROPONINI" in the last 168 hours. BNP (last 3 results) No results for input(s): "PROBNP" in the last 8760 hours. HbA1C: No results for input(s): "HGBA1C" in the last 72 hours. CBG: No results for input(s): "GLUCAP" in the last 168 hours. Lipid Profile: No results for input(s): "CHOL", "HDL", "LDLCALC", "TRIG", "CHOLHDL", "LDLDIRECT" in the last 72 hours. Thyroid Function Tests: No results for input(s): "TSH", "T4TOTAL", "FREET4", "T3FREE", "THYROIDAB" in the last 72 hours. Anemia Panel: Recent Labs    02/10/22 1406  VITAMINB12 237  FOLATE 8.7  FERRITIN 7*  TIBC 280  IRON 19*   Sepsis Labs: No results for input(s): "PROCALCITON", "LATICACIDVEN" in the last 168 hours.  Recent Results (from the past 240 hour(s))  Resp Panel by RT-PCR (Flu A&B, Covid) Anterior Nasal Swab     Status: None   Collection Time: 02/09/22 12:03 PM   Specimen: Anterior Nasal Swab  Result Value Ref Range Status   SARS Coronavirus 2 by RT PCR NEGATIVE NEGATIVE Final    Comment: (NOTE) SARS-CoV-2 target nucleic acids are NOT DETECTED.  The SARS-CoV-2 RNA is generally detectable in upper respiratory specimens during the acute phase of infection. The lowest concentration of SARS-CoV-2 viral copies this assay can detect is 138 copies/mL. A negative result does not preclude SARS-Cov-2 infection and should not be used as the sole basis for treatment or other patient management decisions. A negative result may occur with  improper specimen collection/handling, submission of specimen other than nasopharyngeal swab, presence of viral mutation(s) within the areas targeted by this assay, and inadequate number of viral copies(<138 copies/mL). A negative result must be combined with clinical observations, patient history, and epidemiological information. The expected result is Negative.  Fact Sheet for Patients:  13/02/23  Fact Sheet  for Healthcare Providers:  BloggerCourse.com  This test is no t yet approved or cleared by the [place] FDA and  has been authorized for detection and/or diagnosis of SARS-CoV-2 by FDA under an Emergency Use Authorization (EUA). This EUA will remain  in effect (meaning this test can be used) for the duration of the COVID-19 declaration under Section 564(b)(1) of the Act, 21 U.S.C.section 360bbb-3(b)(1), unless the authorization is terminated  or revoked sooner.       Influenza A by PCR NEGATIVE NEGATIVE Final   Influenza B by PCR NEGATIVE NEGATIVE Final    Comment: (NOTE) The Xpert Xpress SARS-CoV-2/FLU/RSV plus assay is intended as an aid in the diagnosis of influenza from Nasopharyngeal swab specimens and should not be used as a sole basis for treatment. Nasal washings and aspirates are unacceptable for Xpert Xpress SARS-CoV-2/FLU/RSV testing.  Fact Sheet for Patients: [place]  Fact Sheet for Healthcare Providers: BloggerCourse.com  This test is not yet approved or cleared by the [place] FDA and has been authorized for detection and/or diagnosis of SARS-CoV-2 by FDA  under an Emergency Use Authorization (EUA). This EUA will remain in effect (meaning this test can be used) for the duration of the COVID-19 declaration under Section 564(b)(1) of the Act, 21 U.S.C. section 360bbb-3(b)(1), unless the authorization is terminated or revoked.  Performed at [facility], [address], [place], [place] [address]          Radiology Studies: No results found.      Scheduled Meds:  acetaminophen  650 mg Oral Q8H   atorvastatin  80 mg Oral Daily   isosorbide mononitrate  60 mg Oral TID   lisinopril  2.5 mg Oral Daily   metoprolol succinate  25 mg Oral Daily   mirtazapine  30 mg Oral QHS   [START ON 02/13/2022] pantoprazole  40 mg Intravenous Q12H   potassium chloride   40 mEq Oral BID   pyridOXINE  100 mg Oral Daily   Continuous Infusions:  ferric gluconate (FERRLECIT) IVPB 250 mg (02/11/22 1404)   pantoprazole 8 mg/hr (02/12/22 0740)     LOS: 1 day    Time spent: 35 mins     Charise Killian, MD [facility] Pager [phone]  If 7PM-7AM, please contact night-coverage [email or web address] 02/12/2022, 7:56 AM

## 2022-02-12 NOTE — Op Note (Signed)
[facility] Gastroenterology Patient Name: Martin Hernandez Procedure Date: 02/12/2022 12:50 PM MRN: [id number] Account #: [id number] Date of Birth: 1955-07-23 Admit Type: Outpatient Age: 66 Room: [facility] ENDO ROOM 4 Gender: Male Note Status: Finalized Instrument Name: Upper Endoscope [id number] Procedure:             Upper GI endoscopy Indications:           Iron deficiency anemia secondary to chronic blood loss Providers:             Lin Landsman MD, MD Medicines:             General Anesthesia Complications:         No immediate complications. Estimated blood loss: None. Procedure:             Pre-Anesthesia Assessment:                        - Prior to the procedure, a History and Physical was                         performed, and patient medications and allergies were                         reviewed. The patient is competent. The risks and                         benefits of the procedure and the sedation options and                         risks were discussed with the patient. All questions                         were answered and informed consent was obtained.                         Patient identification and proposed procedure were                         verified by the physician, the nurse, the                         anesthesiologist, the anesthetist and the technician                         in the pre-procedure area in the procedure room in the                         endoscopy suite. Mental Status Examination: alert and                         oriented. Airway Examination: normal oropharyngeal                         airway and neck mobility. Respiratory Examination:                         clear to auscultation. CV Examination: normal.  Prophylactic Antibiotics: The patient does not require                         prophylactic antibiotics. Prior Anticoagulants: The                         patient has taken Plavix  (clopidogrel), last dose was                         3 days prior to procedure. ASA Grade Assessment: III -                         A patient with severe systemic disease. After                         reviewing the risks and benefits, the patient was                         deemed in satisfactory condition to undergo the                         procedure. The anesthesia plan was to use general                         anesthesia. Immediately prior to administration of                         medications, the patient was re-assessed for adequacy                         to receive sedatives. The heart rate, respiratory                         rate, oxygen saturations, blood pressure, adequacy of                         pulmonary ventilation, and response to care were                         monitored throughout the procedure. The physical                         status of the patient was re-assessed after the                         procedure.                        After obtaining informed consent, the endoscope was                         passed under direct vision. Throughout the procedure,                         the patient's blood pressure, pulse, and oxygen                         saturations were monitored continuously. The Endoscope  was introduced through the mouth, and advanced to the                         second part of duodenum. The upper GI endoscopy was                         accomplished without difficulty. The patient tolerated                         the procedure well. Findings:      A single 3 mm angioectasia without bleeding was found in the duodenal       bulb. Coagulation for hemostasis using argon plasma was successful.       Estimated blood loss: none.      The second portion of the duodenum was normal.      Multiple dispersed small erosions with no bleeding and no stigmata of       recent bleeding were found in the gastric antrum. Biopsies  were taken       with a cold forceps for Helicobacter pylori testing.      The gastric body and incisura were normal. Biopsies were taken with a       cold forceps for Helicobacter pylori testing.      The cardia and gastric fundus were normal on retroflexion.      White nummular lesions were noted in the lower third of the esophagus.       Biopsies were taken with a cold forceps for histology. Estimated blood       loss: none. Impression:            - A single non-bleeding angioectasia in the duodenum.                         Treated with argon plasma coagulation (APC).                        - Normal second portion of the duodenum.                        - Erosive gastropathy with no bleeding and no stigmata                         of recent bleeding. Biopsied.                        - Normal gastric body and incisura. Biopsied.                        - White nummular lesions in esophageal mucosa.                         Biopsied. Recommendation:        - Await pathology results.                        - Use Prilosec (omeprazole) 40 mg PO daily for 3                         months.                        -  No ibuprofen, naproxen, or other non-steroidal                         anti-inflammatory drugs.                        - Proceed with colonoscopy as scheduled                        See colonoscopy report Procedure Code(s):     --- Professional ---                        43255, 59, Esophagogastroduodenoscopy, flexible,                         transoral; with control of bleeding, any method                        43239, Esophagogastroduodenoscopy, flexible,                         transoral; with biopsy, single or multiple Diagnosis Code(s):     --- Professional ---                        H47.425, Angiodysplasia of stomach and duodenum                         without bleeding                        K31.89, Other diseases of stomach and duodenum                        K22.89, Other  specified disease of esophagus                        D50.0, Iron deficiency anemia secondary to blood loss                         (chronic) CPT copyright 2022 American Medical Association. All rights reserved. The codes documented in this report are preliminary and upon coder review may  be revised to meet current compliance requirements. Dr. Libby Maw Toney Reil MD, MD 02/12/2022 1:32:39 PM This report has been signed electronically. Number of Addenda: 0 Note Initiated On: 02/12/2022 12:50 PM Estimated Blood Loss:  Estimated blood loss: none.      [facility]

## 2022-02-12 NOTE — Progress Notes (Signed)
Pt was cleared by GI, hospitalist did not clear pat for d/c . Confusion btwn who is to discharge patient by family members. Hospitalist has spoken to patient and family numerous times. I have tried to educate family about discharge procedure. Hospitalist does not want pt to bleed out, message reinforced to family and patient.  Martin Hernandez

## 2022-02-12 NOTE — Anesthesia Preprocedure Evaluation (Signed)
Anesthesia Evaluation  Patient identified by MRN, date of birth, ID band Patient awake    Reviewed: Allergy & Precautions, H&P , NPO status , Patient's Chart, lab work & pertinent test results, reviewed documented beta blocker date and time   Airway Mallampati: II   Neck ROM: full    Dental  (+) Poor Dentition   Pulmonary COPD, Current Smoker   Pulmonary exam normal        Cardiovascular Exercise Tolerance: Poor hypertension, On Medications + angina with exertion + CAD, + Peripheral Vascular Disease and +CHF  Normal cardiovascular exam+ Cardiac Defibrillator  Rhythm:regular Rate:Normal     Neuro/Psych  Headaches PSYCHIATRIC DISORDERS Anxiety Depression       GI/Hepatic Neg liver ROS,GERD  Medicated,,  Endo/Other  negative endocrine ROS    Renal/GU Renal disease  negative genitourinary   Musculoskeletal   Abdominal   Peds  Hematology  (+) Blood dyscrasia, anemia   Anesthesia Other Findings Past Medical History: No date: Allergy No date: CAD (coronary artery disease) No date: Depression No date: Frequent headaches No date: GERD (gastroesophageal reflux disease) No date: Headache No date: Hyperlipidemia No date: Hypertension No date: PAD (peripheral artery disease) (HCC) No date: Tobacco abuse Past Surgical History: 09/13/2021: CATARACT EXTRACTION W/PHACO; Right     Comment:  Procedure: CATARACT EXTRACTION PHACO AND INTRAOCULAR               LENS PLACEMENT ([place]) RIGHT;  Surgeon: Birder Robson,               MD;  Location: [place];  Service:               Ophthalmology;  Laterality: Right;  3.53 0:43.1 09/27/2021: CATARACT EXTRACTION W/PHACO; Left     Comment:  Procedure: CATARACT EXTRACTION PHACO AND INTRAOCULAR               LENS PLACEMENT (IOC) LEFT 4.79 00:25.3;  Surgeon:               Birder Robson, MD;  Location: [place];                Service: Ophthalmology;  Laterality:  Left; 06/20/2017: COLONOSCOPY WITH PROPOFOL; N/A     Comment:  Procedure: COLONOSCOPY WITH PROPOFOL;  Surgeon: Jonathon Bellows, MD;  Location: [facility] ENDOSCOPY;  Service:               Gastroenterology;  Laterality: N/A; 07/24/2017: COLONOSCOPY WITH PROPOFOL; N/A     Comment:  Procedure: COLONOSCOPY WITH PROPOFOL;  Surgeon: Jonathon Bellows, MD;  Location: [facility] ENDOSCOPY;  Service:               Gastroenterology;  Laterality: N/A; 06/20/2017: ESOPHAGOGASTRODUODENOSCOPY (EGD) WITH PROPOFOL; N/A     Comment:  Procedure: ESOPHAGOGASTRODUODENOSCOPY (EGD) WITH               PROPOFOL;  Surgeon: Jonathon Bellows, MD;  Location: [facility]               ENDOSCOPY;  Service: Gastroenterology;  Laterality: N/A; No date: PERCUTANEOUS CORONARY STENT INTERVENTION (PCI-S) No date: STOMACH SURGERY     Comment:  2009 Ken Caryl per chart review pt had AAA repair  No date: TONSILLECTOMY BMI    Body Mass Index: 17.72 kg/m     Reproductive/Obstetrics negative OB ROS  Anesthesia Physical Anesthesia Plan  ASA: 4 and emergent  Anesthesia Plan: General   Post-op Pain Management:    Induction:   PONV Risk Score and Plan:   Airway Management Planned:   Additional Equipment:   Intra-op Plan:   Post-operative Plan:   Informed Consent: I have reviewed the patients History and Physical, chart, labs and discussed the procedure including the risks, benefits and alternatives for the proposed anesthesia with the patient or authorized representative who has indicated his/her understanding and acceptance.     Dental Advisory Given  Plan Discussed with: CRNA  Anesthesia Plan Comments:        Anesthesia Quick Evaluation

## 2022-02-12 NOTE — Op Note (Signed)
[facility] Gastroenterology Patient Name: Martin Hernandez Procedure Date: 02/12/2022 12:50 PM MRN: [id number] Account #: [id number] Date of Birth: 05-Dec-1955 Admit Type: Outpatient Age: 66 Room: [facility] ENDO ROOM 4 Gender: Male Note Status: Finalized Instrument Name: Colonscope [id number] Procedure:             Colonoscopy Indications:           Last colonoscopy: April 2019, Unexplained iron                         deficiency anemia Providers:             Toney Reil MD, MD Medicines:             General Anesthesia Complications:         No immediate complications. Estimated blood loss: None. Procedure:             Pre-Anesthesia Assessment:                        - Prior to the procedure, a History and Physical was                         performed, and patient medications and allergies were                         reviewed. The patient is competent. The risks and                         benefits of the procedure and the sedation options and                         risks were discussed with the patient. All questions                         were answered and informed consent was obtained.                         Patient identification and proposed procedure were                         verified by the physician, the nurse, the                         anesthesiologist, the anesthetist and the technician                         in the pre-procedure area in the procedure room in the                         endoscopy suite. Mental Status Examination: alert and                         oriented. Airway Examination: normal oropharyngeal                         airway and neck mobility. Respiratory Examination:                         clear to auscultation. CV Examination: normal.  Prophylactic Antibiotics: The patient does not require                         prophylactic antibiotics. Prior Anticoagulants: The                         patient  has taken Plavix (clopidogrel), last dose was                         3 days prior to procedure. ASA Grade Assessment: III -                         A patient with severe systemic disease. After                         reviewing the risks and benefits, the patient was                         deemed in satisfactory condition to undergo the                         procedure. The anesthesia plan was to use general                         anesthesia. Immediately prior to administration of                         medications, the patient was re-assessed for adequacy                         to receive sedatives. The heart rate, respiratory                         rate, oxygen saturations, blood pressure, adequacy of                         pulmonary ventilation, and response to care were                         monitored throughout the procedure. The physical                         status of the patient was re-assessed after the                         procedure.                        After obtaining informed consent, the colonoscope was                         passed under direct vision. Throughout the procedure,                         the patient's blood pressure, pulse, and oxygen                         saturations were monitored continuously. The  Colonoscope was introduced through the anus and                         advanced to the 10 cm into the ileum. The colonoscopy                         was performed without difficulty. The patient                         tolerated the procedure well. The quality of the bowel                         preparation was adequate. The terminal ileum,                         ileocecal valve, appendiceal orifice, and rectum were                         photographed. Findings:      The perianal and digital rectal examinations were normal. Pertinent       negatives include normal sphincter tone and no palpable rectal lesions.       The terminal ileum appeared normal.      A few diverticula were found in the sigmoid colon.      The exam was otherwise without abnormality.      Unable to perform retroflexion Impression:            - The examined portion of the ileum was normal.                        - Diverticulosis in the sigmoid colon.                        - The examination was otherwise normal.                        - No specimens collected. Recommendation:        - Return patient to hospital ward for ongoing care.                        - Advance diet as tolerated today.                        - Continue present medications.                        - To visualize the small bowel, perform video capsule                         endoscopy at appointment to be scheduled.                        - Resume Plavix (clopidogrel) tomorrow at prior dose.                         Refer to managing physician for further adjustment of                         therapy. Procedure Code(s):     ---  Professional ---                        [phone], Colonoscopy, flexible; diagnostic, including                         collection of specimen(s) by brushing or washing, when                         performed (separate procedure) Diagnosis Code(s):     --- Professional ---                        D50.9, Iron deficiency anemia, unspecified                        K57.30, Diverticulosis of large intestine without                         perforation or abscess without bleeding CPT copyright 2022 American Medical Association. All rights reserved. The codes documented in this report are preliminary and upon coder review may  be revised to meet current compliance requirements. Dr. Ulyess Mort Lin Landsman MD, MD 02/12/2022 1:58:46 PM This report has been signed electronically. Number of Addenda: 0 Note Initiated On: 02/12/2022 12:50 PM Scope Withdrawal Time: 0 hours 7 minutes 48 seconds  Total Procedure Duration: 0 hours 12 minutes 7  seconds  Estimated Blood Loss:  Estimated blood loss: none.      [facility]

## 2022-02-12 NOTE — Progress Notes (Signed)
[facility] Cardiology    SUBJECTIVE: Patient doing reasonably well status post EGD with no bleeding continue current therapy once we resume Plavix we will discontinue aspirin because of potential for bleeding   Vitals:   02/12/22 0330 02/12/22 0823 02/12/22 0833 02/12/22 1256  BP: 133/78  107/76 131/87  Pulse: 61  (!) 56 (!) 57  Resp: 18  20 20   Temp: 98.2 F (36.8 C)  (!) 97.4 F (36.3 C) 98.7 F (37.1 C)  TempSrc:    Temporal  SpO2: 94% 91% 96% 97%  Weight:      Height:         Intake/Output Summary (Last 24 hours) at 02/12/2022 1329 Last data filed at 02/11/2022 2300 Gross per 24 hour  Intake 678.88 ml  Output --  Net 678.88 ml      PHYSICAL EXAM  General: Well developed, well nourished, in no acute distress HEENT:  Normocephalic and atramatic Neck:  No JVD.  Lungs: Clear bilaterally to auscultation and percussion. Heart: HRRR . Normal S1 and S2 without gallops or murmurs.  Abdomen: Bowel sounds are positive, abdomen soft and non-tender  Msk:  Back normal, normal gait. Normal strength and tone for age. Extremities: No clubbing, cyanosis or edema.   Neuro: Alert and oriented X 3. Psych:  Good affect, responds appropriately   LABS: Basic Metabolic Panel: Recent Labs    02/11/22 0819 02/12/22 0446  NA 140 140  K 3.6 3.1*  CL 111 105  CO2 23 26  GLUCOSE 78 122*  BUN 9 8  CREATININE 0.77 0.78  CALCIUM 8.1* 8.3*   Liver Function Tests: No results for input(s): "AST", "ALT", "ALKPHOS", "BILITOT", "PROT", "ALBUMIN" in the last 72 hours. No results for input(s): "LIPASE", "AMYLASE" in the last 72 hours. CBC: Recent Labs    02/11/22 0819 02/12/22 0446  WBC 7.0 5.8  HGB 9.3* 10.2*  HCT 29.4* 32.2*  MCV 88.0 86.8  PLT 262 275   Cardiac Enzymes: No results for input(s): "CKTOTAL", "CKMB", "CKMBINDEX", "TROPONINI" in the last 72 hours. BNP: Invalid input(s): "POCBNP" D-Dimer: No results for input(s): "DDIMER" in the last 72 hours. Hemoglobin A1C: No  results for input(s): "HGBA1C" in the last 72 hours. Fasting Lipid Panel: No results for input(s): "CHOL", "HDL", "LDLCALC", "TRIG", "CHOLHDL", "LDLDIRECT" in the last 72 hours. Thyroid Function Tests: No results for input(s): "TSH", "T4TOTAL", "T3FREE", "THYROIDAB" in the last 72 hours.  Invalid input(s): "FREET3" Anemia Panel: Recent Labs    02/10/22 1406  VITAMINB12 237  FOLATE 8.7  FERRITIN 7*  TIBC 280  IRON 19*    No results found.   Echo ischemic cardiomyopathy ejection fraction around 50 to 55% and January  TELEMETRY: Sinus rhythm rate of around 80 nonspecific ST-T changes:  ASSESSMENT AND PLAN:  Principal Problem:   Symptomatic anemia Active Problems:   AAA (abdominal aortic aneurysm) (HCC)   HLD (hyperlipidemia)   Cardiomyopathy, ischemic   PAD (peripheral artery disease) (HCC)   Current tobacco use   Weight loss   Essential hypertension   Anxiety and depression   Benign prostatic hyperplasia with urinary retention   Atherosclerosis   Chest pain   Iron deficiency anemia due to chronic blood loss    Plan Continue telemetry and follow-up troponins EKGs Status post EGD with evidence of angio ectasia erosive gastropathy but no active bleeding currently recommend continue Plavix we will discontinue aspirin Patient currently on IV PPIs holding aspirin and Plavix for EGD Advised patient refrain from tobacco abuse For hypertension  continue Imdur metoprolol lisinopril PAD will continue statin Plavix would recommend discontinuing aspirin Ischemic cardiomyopathy patient currently on Imdur metoprolol lisinopril statin can consider resuming Plavix but discontinue aspirin Hyperlipidemia continue statin therapy    Yolonda Kida, MD 02/12/2022 1:29 PM

## 2022-02-13 ENCOUNTER — Encounter: Payer: Self-pay | Admitting: Gastroenterology

## 2022-02-13 NOTE — Discharge Summary (Signed)
Physician Discharge Summary  Martin Hernandez Martin Hernandez. [id number] DOB: Dec 04, 1955 DOA: 02/09/2022  PCP: Hoy Register, MD  Admit date: 02/09/2022 Discharge date: 02/13/2022  Admitted From: home  Disposition:  Pt left [place]: no  Equipment/Devices:  Discharge Condition: Pt left AMA CODE STATUS: full  Diet recommendation: Heart Healthy   Brief/Interim Summary: HPI was taken from Dr. Tobie Poet: [name] is a 66 year old male with history of hypertension, CAD, multiple MIs, AICD, AAA, ischemic cardiomyopathy, PVD with claudication, current tobacco use, hyperlipidemia, GERD, neuropathy, insomnia, who presents to the emergency department for chief concerns of shortness of breath [place].   Patient [place] office [place] money order when he developed worsening weakness and shortness of breath.     Initial vitals in the emergency department showed temperature of 97.5, respiration rate of 18, heart rate of 66, blood pressure 87/62, SPO2 of 95% on room air.   Serum sodium is 138, potassium 3.6, chloride of 110, bicarb 20, BUN of 11, serum creatinine of 0.42, GFR greater than 60, nonfasting blood glucose 76, WBC 5.0, hemoglobin 8.4, platelets of 231.   COVID/influenza A/influenza B PCR were negative.  High-sensitivity troponin was negative x2.   ED treatment: LR 1 L bolus. --------------------------- At bedside, he is able to tell me his name, age, current location of hospital. The current year.    He reports that he has been short of breath for the last 2 to 3 days.  He reports that shortness of breath he denies any known sick contacts and changes to cough, chills.  He denies fever.  He denies trauma to his person.   He unintentional lost 4 pounds in two months. He denies fever, new cough, chest pain. He endorses shortness of breat with exertion.    He takes [facility] powder daily for headaches.   Social history: He  lives at home with his wife. He is current tobacco user, smoking 1/2 ppd and he started smoking at age 42. He denies etoh and recreational drug use. He is formerly a Dealer.   As per Dr. Jimmye Norman 11/3-11/5/23: Pt presented w/ GI bleed and was found to have a single non-bleeding angioectasia in the duodenum treated w/ APC, erosive gastropathy w/ no bleeding ( which was biopsied), white nummular lesions in the esophageal mucosa (which was biopsied) on EGD on 02/12/22. After the endoscopy and colonoscopy, pt wanted to go home despite being told that the pt would have to stay overnight to monitor for complications. Pt decided to leave AMA. For more information, please see previous progress/consults.   Discharge Diagnoses:  Principal Problem:   Symptomatic anemia Active Problems:   AAA (abdominal aortic aneurysm) (HCC)   HLD (hyperlipidemia)   Cardiomyopathy, ischemic   PAD (peripheral artery disease) (HCC)   Current tobacco use   Weight loss   Essential hypertension   Anxiety and depression   Benign prostatic hyperplasia with urinary retention   Atherosclerosis   Chest pain   Iron deficiency anemia due to chronic blood loss   Gastric erosion   AVM (arteriovenous malformation) of duodenum, acquired  Symptomatic anemia: etiology unclear, possible GI bleed. Takes BC powder daily for headaches.  Continue on IV PPI. Holding aspirin, plavix. S/p EGD which showed single non-bleeding angioectasia in the duodenum, treated w/ APC, erosive gastropathy w/ no bleeding (which was biopsied), white nummular lesions in esophageal mucosa (which was biopsied). S/p colonoscopy which showed diverticulosis  in the sigmoid colon otherwise examination was normal. H&H are trending up currently     HTN: continue on imdur, metoprolol, lisinopril. Hold for MAP < 65   Weight loss/underweight: continue on home dose of mirtazapine. BMI 17.7.    Current tobacco use: nicotine patch to prevent w/drawl. Smoking cessation  counseling x 44mins    PAD: continue on statin. Holding plavix, aspirin.    Ischemic cardiomyopathy: continue on imdur, metoprolol, lisinopril, statin. Holding plavix, aspirin    HLD: continue on statin   Discharge Instructions   Allergies as of 02/12/2022   No Known Allergies      Medication List     ASK your doctor about these medications    alfuzosin 10 MG 24 hr tablet Commonly known as: UROXATRAL Take 1 tablet (10 mg total) by mouth daily with breakfast. Dc flomax 0.4   aspirin 81 MG tablet Take 81 mg by mouth daily.   atorvastatin 80 MG tablet Commonly known as: LIPITOR Take 1 tablet (80 mg total) by mouth daily at 6pm.   Baclofen 5 MG Tabs Take 1 tablet by mouth twice daily.   Belbuca 600 MCG Film Generic drug: Buprenorphine HCl Place inside cheek 2 (two) times daily.   clopidogrel 75 MG tablet Commonly known as: PLAVIX 1 pill daily   furosemide 20 MG tablet Commonly known as: LASIX Take 20 mg by mouth daily.   gabapentin 300 MG capsule Commonly known as: NEURONTIN Take 300 mg by mouth 3 (three) times daily.   ipratropium 0.02 % nebulizer solution Commonly known as: ATROVENT Inhale into the lungs.   isosorbide mononitrate 60 MG 24 hr tablet Commonly known as: IMDUR Take 1 tablet by mouth in the morning, at noon, and at bedtime.   levocetirizine 5 MG tablet Commonly known as: XYZAL Take 1 tablet by mouth every evening.   lisinopril 2.5 MG tablet Commonly known as: ZESTRIL Take 2.5 mg by mouth daily.   metoprolol succinate 25 MG 24 hr tablet Commonly known as: TOPROL-XL Take 1 tablet (25 mg total) by mouth daily.   mirtazapine 30 MG tablet Commonly known as: REMERON Take 1 tablet by mouth nightly.   nitroGLYCERIN 0.4 MG SL tablet Commonly known as: NITROSTAT Place 1 tablet (0.4 mg total) under the tongue every 5 (five) minutes as needed for chest pain.   oxyCODONE-acetaminophen 10-325 MG tablet Commonly known as: PERCOCET Take 1  tablet by mouth as needed.   pantoprazole 40 MG tablet Commonly known as: PROTONIX Take 1 tablet by mouth daily 30 minutes before food.   pyridOXINE 100 MG tablet Commonly known as: VITAMIN B6 Take 1 tablet (100 mg total) by mouth daily.   tamsulosin 0.4 MG Caps capsule Commonly known as: FLOMAX   thiamine 100 MG tablet Commonly known as: Vitamin B-1 Take 100 mg by mouth daily.   zolpidem 10 MG tablet Commonly known as: AMBIEN Take 1 tablet (10 mg total) by mouth at bedtime as needed. for sleep        No Known Allergies  Consultations: GI    Procedures/Studies: CT Angio Chest/Abd/Pel for Dissection W and/or Wo Contrast  Result Date: 02/09/2022 CLINICAL DATA:  66 year old male with altered mental status. Weakness, malaise. Chest pain radiating to the left shoulder. History of MI. EXAM: CT ANGIOGRAPHY CHEST, ABDOMEN AND PELVIS TECHNIQUE: Non-contrast CT of the chest was initially obtained. Multidetector CT imaging through the chest, abdomen and pelvis was performed using the standard protocol during bolus administration of intravenous contrast. Multiplanar reconstructed images  and MIPs were obtained and reviewed to evaluate the vascular anatomy. RADIATION DOSE REDUCTION: This exam was performed according to the departmental dose-optimization program which includes automated exposure control, adjustment of the mA and/or kV according to patient size and/or use of iterative reconstruction technique. CONTRAST:  170mL OMNIPAQUE IOHEXOL 350 MG/ML SOLN COMPARISON:  CT Chest, Abdomen, and Pelvis 08/16/2017. CT Abdomen and Pelvis 08/21/2021. FINDINGS: CTA CHEST FINDINGS Cardiovascular: Chronic left chest AICD. Cardiomegaly appears stable since 2019. No pericardial effusion. Calcified coronary artery atherosclerosis and/or stents. Calcified aortic atherosclerosis. Negative for aortic aneurysm or dissection. No visible pulmonary artery filling defect. Mediastinum/Nodes: Negative. No mediastinal  mass or lymphadenopathy. Lungs/Pleura: Chronic centrilobular and paraseptal/bullous emphysema. Lung volumes are stable since 2019. Non dependent retained secretions along the left anterolateral wall of the trachea series 7, image 51, but major airways are patent. Dependent left greater than right lung base opacity has increased and is only partially enhancing. No definite pleural fluid. But elsewhere the lungs are stable since 2019. Musculoskeletal: Chronic left anterior 4th rib fracture redemonstrated. No acute osseous abnormality identified. Review of the MIP images confirms the above findings. CTA ABDOMEN AND PELVIS FINDINGS VASCULAR Aortoiliac calcified atherosclerosis. 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 Embolization coils there which also track into the left up to rater appears stable since the noncontrast CT this April. Other major arterial structures appear patent. No dissection identified. Tortuous abdominal aorta but no discrete abdominal aortic aneurysm. Review of the MIP images confirms the above findings. NON-VASCULAR Hepatobiliary: Liver and gallbladder appear negative. Pancreas: Negative. Spleen: Negative. Adrenals/Urinary Tract: Stable kidneys and adrenal gland since 2019. Chronic left lower pole nephrolithiasis now measures up to 9 mm. No hydronephrosis. Unremarkable bladder. Stomach/Bowel: Stool ball in the rectum increased from prior exams. Upstream large bowel retained stool and redundant sigmoid. Chronic sigmoid diverticulosis is apparent, but there is limited bowel detail due to early contrast timing and lack of oral contrast. However, the descending colon appears abnormally thickened and indistinct such as on series 5, image 125. The wall thickening appears to extend at least to the proximal sigmoid. No pneumoperitoneum identified. No definite free fluid. Upstream  transverse colon is difficult to delineate and probably completely decompressed through the midline. Right: At the hepatic flexure also appears indistinct. Cecum has a more normal appearance. No dilated small bowel. Questionable visualization of the appendix. No pericecal inflammation identified. Lymphatic: No lymphadenopathy is evident. Reproductive: No acute finding. Other: Bulky but stable prostate and bilateral seminal vesicles. No convincing pelvic free fluid. Musculoskeletal: Chronic left femur ORIF. Chronic L4-L5 degeneration. No acute osseous abnormality identified. Review of the MIP images confirms the above findings. IMPRESSION: 1. Negative for aortic dissection or aneurysm. Positive for Aortic Atherosclerosis (ICD10-I70.0 and chronic left iliac artery stenting and coil embolization. 2. Suboptimal bowel detail with CTA technique but indistinct appearance of the large bowel suspicious for a widespread Acute Colitis. Underlying diverticulosis, rectal stool ball. No free air, free fluid, or bowel obstruction. If the diagnosis is in doubt then repeat CT Abdomen and Pelvis with oral and IV contrast would be ideal. 3. Bullous Emphysema (ICD10-J43.9). Bilateral dependent lower lobe opacity, probably atelectasis but difficult to exclude infection. No significant pleural fluid. 4. Chronic Cardiomegaly, left nephrolithiasis. Electronically Signed   By: Genevie Ann M.D.   On: 02/09/2022 14:28   CT Head Wo Contrast  Result Date: 02/09/2022 CLINICAL DATA:  Mental status  change, unknown cause EXAM: CT HEAD WITHOUT CONTRAST TECHNIQUE: Contiguous axial images were obtained from the base of the skull through the vertex without intravenous contrast. RADIATION DOSE REDUCTION: This exam was performed according to the departmental dose-optimization program which includes automated exposure control, adjustment of the mA and/or kV according to patient size and/or use of iterative reconstruction technique. COMPARISON:  None  Available. FINDINGS: Brain: No evidence of acute infarction, hemorrhage, hydrocephalus, extra-axial collection or mass lesion/mass effect. Vascular: No hyperdense vessel identified. Skull: No acute fracture. Sinuses/Orbits: Clear visualized sinuses. No acute orbital findings. Other: No mastoid effusions. IMPRESSION: No evidence of acute intracranial abnormality. Electronically Signed   By: Margaretha Sheffield M.D.   On: 02/09/2022 12:23   DG Chest Portable 1 View  Result Date: 02/09/2022 CLINICAL DATA:  Chest pain, weakness EXAM: PORTABLE CHEST 1 VIEW COMPARISON:  04/18/2018 FINDINGS: Lungs are hyperinflated as can be seen with COPD. Mild bilateral interstitial thickening. No focal consolidation. No pleural effusion or pneumothorax. Heart and mediastinal contours are unremarkable. No acute osseous abnormality. IMPRESSION: 1. Mild bilateral interstitial thickening which may reflect mild interstitial edema versus infection. 2. COPD. Electronically Signed   By: Kathreen Devoid M.D.   On: 02/09/2022 11:23   (Echo, Carotid, EGD, Colonoscopy, ERCP)    Subjective: Pt c/o malaise and being hungry    Discharge Exam: Vitals:   02/12/22 1415 02/12/22 1434  BP: 139/88 138/87  Pulse: (!) 55 (!) 53  Resp: 15 18  Temp: 97.7 F (36.5 C) 97.8 F (36.6 C)  SpO2: 94% 96%   Vitals:   02/12/22 1357 02/12/22 1402 02/12/22 1415 02/12/22 1434  BP: 100/69 119/67 139/88 138/87  Pulse: 60 (!) 58 (!) 55 (!) 53  Resp: 18 15 15 18   Temp: (!) 97.4 F (36.3 C)  97.7 F (36.5 C) 97.8 F (36.6 C)  TempSrc:      SpO2: 99% 95% 94% 96%  Weight:      Height:        General: Pt is alert, awake, not in acute distress. Frail appearing  Cardiovascular: S1/S2 +, no rubs, no gallops Respiratory: CTA bilaterally, no wheezing, no rhonchi Abdominal: Soft, NT, ND, bowel sounds + Extremities: no edema, no cyanosis    The results of significant diagnostics from this hospitalization (including imaging, microbiology, ancillary  and laboratory) are listed below for reference.     Microbiology: Recent Results (from the past 240 hour(s))  Resp Panel by RT-PCR (Flu A&B, Covid) Anterior Nasal Swab     Status: None   Collection Time: 02/09/22 12:03 PM   Specimen: Anterior Nasal Swab  Result Value Ref Range Status   SARS Coronavirus 2 by RT PCR NEGATIVE NEGATIVE Final    Comment: (NOTE) SARS-CoV-2 target nucleic acids are NOT DETECTED.  The SARS-CoV-2 RNA is generally detectable in upper respiratory specimens during the acute phase of infection. The lowest concentration of SARS-CoV-2 viral copies this assay can detect is 138 copies/mL. A negative result does not preclude SARS-Cov-2 infection and should not be used as the sole basis for treatment or other patient management decisions. A negative result may occur with  improper specimen collection/handling, submission of specimen other than nasopharyngeal swab, presence of viral mutation(s) within the areas targeted by this assay, and inadequate number of viral copies(<138 copies/mL). A negative result must be combined with clinical observations, patient history, and epidemiological information. The expected result is Negative.  Fact Sheet for Patients:  EntrepreneurPulse.com.au  Fact Sheet for Healthcare Providers:  IncredibleEmployment.be  This test is no t yet approved or cleared by the [place] and  has been authorized for detection and/or diagnosis of SARS-CoV-2 by FDA under an Emergency Use Authorization (EUA). This EUA will remain  in effect (meaning this test can be used) for the duration of the COVID-19 declaration under Section 564(b)(1) of the Act, 21 U.S.C.section 360bbb-3(b)(1), unless the authorization is terminated  or revoked sooner.       Influenza A by PCR NEGATIVE NEGATIVE Final   Influenza B by PCR NEGATIVE NEGATIVE Final    Comment: (NOTE) The Xpert Xpress SARS-CoV-2/FLU/RSV plus assay is  intended as an aid in the diagnosis of influenza from Nasopharyngeal swab specimens and should not be used as a sole basis for treatment. Nasal washings and aspirates are unacceptable for Xpert Xpress SARS-CoV-2/FLU/RSV testing.  Fact Sheet for Patients: EntrepreneurPulse.com.au  Fact Sheet for Healthcare Providers: IncredibleEmployment.be  This test is not yet approved or cleared by the [place] FDA and has been authorized for detection and/or diagnosis of SARS-CoV-2 by FDA under an Emergency Use Authorization (EUA). This EUA will remain in effect (meaning this test can be used) for the duration of the COVID-19 declaration under Section 564(b)(1) of the Act, 21 U.S.C. section 360bbb-3(b)(1), unless the authorization is terminated or revoked.  Performed at [facility], [address] [place], [place] [address]      Labs: BNP (last 3 results) No results for input(s): "BNP" in the last 8760 hours. Basic Metabolic Panel: Recent Labs  Lab 02/09/22 1122 02/10/22 0412 02/11/22 0819 02/12/22 0446  NA 138 144 140 140  K 3.6 3.6 3.6 3.1*  CL 110 113* 111 105  CO2 20* 26 23 26   GLUCOSE 76 84 78 122*  BUN 11 10 9 8   CREATININE 0.42* 0.70 0.77 0.78  CALCIUM 7.3* 8.1* 8.1* 8.3*   Liver Function Tests: Recent Labs  Lab 02/09/22 1122  AST 19  ALT 7  ALKPHOS 27*  BILITOT 0.3  PROT 4.4*  ALBUMIN 2.4*   No results for input(s): "LIPASE", "AMYLASE" in the last 168 hours. No results for input(s): "AMMONIA" in the last 168 hours. CBC: Recent Labs  Lab 02/09/22 1123 02/09/22 1631 02/10/22 0412 02/11/22 0819 02/12/22 0446  WBC 5.0 7.1 6.8 7.0 5.8  HGB 8.4* 8.6* 8.6* 9.3* 10.2*  HCT 26.7* 27.2* 26.8* 29.4* 32.2*  MCV 89.6 88.6 88.2 88.0 86.8  PLT 231 234 233 262 275   Cardiac Enzymes: No results for input(s): "CKTOTAL", "CKMB", "CKMBINDEX", "TROPONINI" in the last 168 hours. BNP: Invalid input(s): "POCBNP" CBG: No  results for input(s): "GLUCAP" in the last 168 hours. D-Dimer No results for input(s): "DDIMER" in the last 72 hours. Hgb A1c No results for input(s): "HGBA1C" in the last 72 hours. Lipid Profile No results for input(s): "CHOL", "HDL", "LDLCALC", "TRIG", "CHOLHDL", "LDLDIRECT" in the last 72 hours. Thyroid function studies No results for input(s): "TSH", "T4TOTAL", "T3FREE", "THYROIDAB" in the last 72 hours.  Invalid input(s): "FREET3" Anemia work up No results for input(s): "VITAMINB12", "FOLATE", "FERRITIN", "TIBC", "IRON", "RETICCTPCT" in the last 72 hours. Urinalysis    Component Value Date/Time   COLORURINE YELLOW 08/10/2021 0830   APPEARANCEUR CLEAR 08/10/2021 0830   APPEARANCEUR Clear 09/18/2019 1049   LABSPEC 1.020 08/10/2021 0830   PHURINE 5.5 08/10/2021 0830   GLUCOSEU NEGATIVE 08/10/2021 0830   GLUCOSEU 100 (A) 05/22/2017 0836   HGBUR NEGATIVE 08/10/2021 0830   BILIRUBINUR NEGATIVE 08/10/2021 0830   BILIRUBINUR Negative 09/18/2019 1049  KETONESUR NEGATIVE 08/10/2021 0830   PROTEINUR NEGATIVE 08/10/2021 0830   UROBILINOGEN 0.2 05/22/2017 0836   NITRITE NEGATIVE 08/10/2021 0830   LEUKOCYTESUR NEGATIVE 08/10/2021 0830   Sepsis Labs Recent Labs  Lab 02/09/22 1631 02/10/22 0412 02/11/22 0819 02/12/22 0446  WBC 7.1 6.8 7.0 5.8   Microbiology Recent Results (from the past 240 hour(s))  Resp Panel by RT-PCR (Flu A&B, Covid) Anterior Nasal Swab     Status: None   Collection Time: 02/09/22 12:03 PM   Specimen: Anterior Nasal Swab  Result Value Ref Range Status   SARS Coronavirus 2 by RT PCR NEGATIVE NEGATIVE Final    Comment: (NOTE) SARS-CoV-2 target nucleic acids are NOT DETECTED.  The SARS-CoV-2 RNA is generally detectable in upper respiratory specimens during the acute phase of infection. The lowest concentration of SARS-CoV-2 viral copies this assay can detect is 138 copies/mL. A negative result does not preclude SARS-Cov-2 infection and should not be used  as the sole basis for treatment or other patient management decisions. A negative result may occur with  improper specimen collection/handling, submission of specimen other than nasopharyngeal swab, presence of viral mutation(s) within the areas targeted by this assay, and inadequate number of viral copies(<138 copies/mL). A negative result must be combined with clinical observations, patient history, and epidemiological information. The expected result is Negative.  Fact Sheet for Patients:  EntrepreneurPulse.com.au  Fact Sheet for Healthcare Providers:  IncredibleEmployment.be  This test is no t yet approved or cleared by the [place] FDA and  has been authorized for detection and/or diagnosis of SARS-CoV-2 by FDA under an Emergency Use Authorization (EUA). This EUA will remain  in effect (meaning this test can be used) for the duration of the COVID-19 declaration under Section 564(b)(1) of the Act, 21 U.S.C.section 360bbb-3(b)(1), unless the authorization is terminated  or revoked sooner.       Influenza A by PCR NEGATIVE NEGATIVE Final   Influenza B by PCR NEGATIVE NEGATIVE Final    Comment: (NOTE) The Xpert Xpress SARS-CoV-2/FLU/RSV plus assay is intended as an aid in the diagnosis of influenza from Nasopharyngeal swab specimens and should not be used as a sole basis for treatment. Nasal washings and aspirates are unacceptable for Xpert Xpress SARS-CoV-2/FLU/RSV testing.  Fact Sheet for Patients: EntrepreneurPulse.com.au  Fact Sheet for Healthcare Providers: IncredibleEmployment.be  This test is not yet approved or cleared by the [place] FDA and has been authorized for detection and/or diagnosis of SARS-CoV-2 by FDA under an Emergency Use Authorization (EUA). This EUA will remain in effect (meaning this test can be used) for the duration of the COVID-19 declaration under Section 564(b)(1)  of the Act, 21 U.S.C. section 360bbb-3(b)(1), unless the authorization is terminated or revoked.  Performed at [facility], [address] [place], [place] [address]      Time coordinating discharge: Over 30 minutes  SIGNED:   Wyvonnia Dusky, MD  [facility] 02/13/2022, 4:51 PM Pager   If 7PM-7AM, please contact night-coverage [email or web address]

## 2022-02-13 NOTE — Anesthesia Postprocedure Evaluation (Signed)
Anesthesia Post Note  Patient: Martin Hernandez.  Procedure(s) Performed: ESOPHAGOGASTRODUODENOSCOPY (EGD) COLONOSCOPY WITH PROPOFOL  Patient location during evaluation: PACU Anesthesia Type: General Level of consciousness: awake and alert Pain management: pain level controlled Vital Signs Assessment: post-procedure vital signs reviewed and stable Respiratory status: spontaneous breathing, nonlabored ventilation, respiratory function stable and patient connected to nasal cannula oxygen Cardiovascular status: blood pressure returned to baseline and stable Postop Assessment: no apparent nausea or vomiting Anesthetic complications: no   No notable events documented.   Last Vitals:  Vitals:   02/12/22 1415 02/12/22 1434  BP: 139/88 138/87  Pulse: (!) 55 (!) 53  Resp: 15 18  Temp: 36.5 C 36.6 C  SpO2: 94% 96%    Last Pain:  Vitals:   02/12/22 1539  TempSrc:   PainSc: 0-No pain                 Molli Barrows

## 2022-02-14 LAB — SURGICAL PATHOLOGY

## 2022-02-15 ENCOUNTER — Telehealth: Payer: Self-pay

## 2022-02-15 NOTE — Telephone Encounter (Signed)
Called and left a message for call back  

## 2022-02-15 NOTE — Telephone Encounter (Signed)
-----   Message from Toney Reil, MD sent at 02/15/2022 12:06 PM EST ----- Please inform patient that the pathology results from upper endoscopy came back normal.  Recommend video capsule endoscopy as outpatient for follow-up of iron deficiency anemia  Lannette Donath, MD

## 2022-02-16 ENCOUNTER — Other Ambulatory Visit: Payer: Self-pay

## 2022-02-16 DIAGNOSIS — D509 Iron deficiency anemia, unspecified: Secondary | ICD-10-CM

## 2022-02-16 NOTE — Telephone Encounter (Signed)
Called patient and patient states he can not do any time in November because he has several appointments. He wants to do December. Schedule for 03/13/2022 with Dr. Allegra Lai. Went over instructions, sent to [employer] and mailed

## 2022-02-21 DIAGNOSIS — D62 Acute posthemorrhagic anemia: Secondary | ICD-10-CM | POA: Diagnosis not present

## 2022-02-21 DIAGNOSIS — I252 Old myocardial infarction: Secondary | ICD-10-CM | POA: Diagnosis not present

## 2022-02-21 DIAGNOSIS — K5732 Diverticulitis of large intestine without perforation or abscess without bleeding: Secondary | ICD-10-CM | POA: Diagnosis not present

## 2022-02-21 DIAGNOSIS — F17210 Nicotine dependence, cigarettes, uncomplicated: Secondary | ICD-10-CM | POA: Diagnosis not present

## 2022-02-21 DIAGNOSIS — I5181 Takotsubo syndrome: Secondary | ICD-10-CM | POA: Diagnosis not present

## 2022-02-21 DIAGNOSIS — K5730 Diverticulosis of large intestine without perforation or abscess without bleeding: Secondary | ICD-10-CM | POA: Diagnosis not present

## 2022-02-21 DIAGNOSIS — R634 Abnormal weight loss: Secondary | ICD-10-CM | POA: Diagnosis not present

## 2022-02-21 DIAGNOSIS — E559 Vitamin D deficiency, unspecified: Secondary | ICD-10-CM | POA: Diagnosis not present

## 2022-02-21 DIAGNOSIS — I358 Other nonrheumatic aortic valve disorders: Secondary | ICD-10-CM | POA: Diagnosis not present

## 2022-02-21 DIAGNOSIS — K921 Melena: Secondary | ICD-10-CM | POA: Diagnosis not present

## 2022-02-21 DIAGNOSIS — F17200 Nicotine dependence, unspecified, uncomplicated: Secondary | ICD-10-CM | POA: Diagnosis not present

## 2022-02-21 DIAGNOSIS — R1084 Generalized abdominal pain: Secondary | ICD-10-CM | POA: Diagnosis not present

## 2022-02-21 DIAGNOSIS — D649 Anemia, unspecified: Secondary | ICD-10-CM | POA: Diagnosis not present

## 2022-02-21 DIAGNOSIS — K5520 Angiodysplasia of colon without hemorrhage: Secondary | ICD-10-CM | POA: Diagnosis not present

## 2022-02-21 DIAGNOSIS — K269 Duodenal ulcer, unspecified as acute or chronic, without hemorrhage or perforation: Secondary | ICD-10-CM | POA: Diagnosis not present

## 2022-02-21 DIAGNOSIS — K6389 Other specified diseases of intestine: Secondary | ICD-10-CM | POA: Diagnosis not present

## 2022-02-21 DIAGNOSIS — I5022 Chronic systolic (congestive) heart failure: Secondary | ICD-10-CM | POA: Diagnosis not present

## 2022-02-21 DIAGNOSIS — Z681 Body mass index (BMI) 19 or less, adult: Secondary | ICD-10-CM | POA: Diagnosis not present

## 2022-02-21 DIAGNOSIS — E538 Deficiency of other specified B group vitamins: Secondary | ICD-10-CM | POA: Diagnosis not present

## 2022-02-21 DIAGNOSIS — E43 Unspecified severe protein-calorie malnutrition: Secondary | ICD-10-CM | POA: Diagnosis not present

## 2022-02-21 DIAGNOSIS — I739 Peripheral vascular disease, unspecified: Secondary | ICD-10-CM | POA: Diagnosis not present

## 2022-02-21 DIAGNOSIS — K31811 Angiodysplasia of stomach and duodenum with bleeding: Secondary | ICD-10-CM | POA: Diagnosis not present

## 2022-02-21 DIAGNOSIS — K3189 Other diseases of stomach and duodenum: Secondary | ICD-10-CM | POA: Diagnosis not present

## 2022-02-21 DIAGNOSIS — I110 Hypertensive heart disease with heart failure: Secondary | ICD-10-CM | POA: Diagnosis not present

## 2022-02-21 DIAGNOSIS — M545 Low back pain, unspecified: Secondary | ICD-10-CM | POA: Diagnosis not present

## 2022-02-21 DIAGNOSIS — G8929 Other chronic pain: Secondary | ICD-10-CM | POA: Diagnosis not present

## 2022-02-21 DIAGNOSIS — R519 Headache, unspecified: Secondary | ICD-10-CM | POA: Diagnosis not present

## 2022-02-21 DIAGNOSIS — J439 Emphysema, unspecified: Secondary | ICD-10-CM | POA: Diagnosis not present

## 2022-02-21 DIAGNOSIS — E785 Hyperlipidemia, unspecified: Secondary | ICD-10-CM | POA: Diagnosis not present

## 2022-02-21 DIAGNOSIS — I2510 Atherosclerotic heart disease of native coronary artery without angina pectoris: Secondary | ICD-10-CM | POA: Diagnosis not present

## 2022-02-21 DIAGNOSIS — R079 Chest pain, unspecified: Secondary | ICD-10-CM | POA: Diagnosis not present

## 2022-02-21 DIAGNOSIS — K31819 Angiodysplasia of stomach and duodenum without bleeding: Secondary | ICD-10-CM | POA: Diagnosis not present

## 2022-02-21 DIAGNOSIS — I255 Ischemic cardiomyopathy: Secondary | ICD-10-CM | POA: Diagnosis not present

## 2022-02-21 DIAGNOSIS — R9431 Abnormal electrocardiogram [ECG] [EKG]: Secondary | ICD-10-CM | POA: Diagnosis not present

## 2022-02-21 DIAGNOSIS — K219 Gastro-esophageal reflux disease without esophagitis: Secondary | ICD-10-CM | POA: Diagnosis not present

## 2022-02-21 DIAGNOSIS — R112 Nausea with vomiting, unspecified: Secondary | ICD-10-CM | POA: Diagnosis not present

## 2022-02-21 DIAGNOSIS — G629 Polyneuropathy, unspecified: Secondary | ICD-10-CM | POA: Diagnosis not present

## 2022-02-21 HISTORY — DX: Melena: K92.1

## 2022-02-28 ENCOUNTER — Inpatient Hospital Stay: Payer: Medicare Other | Admitting: Family

## 2022-02-28 ENCOUNTER — Telehealth: Payer: Self-pay

## 2022-02-28 NOTE — Patient Outreach (Signed)
  Care Coordination [facility] Note Transition Care Management Follow-up Telephone Call Date of discharge and from where: [facility] 02/21/22-02/24/22 How have you been since you were released from the hospital? "I am doing a whole lot better.  I did not even know I was bleeding in my gut". Any questions or concerns? No  Items Reviewed: Did the pt receive and understand the discharge instructions provided? Yes  Medications obtained and verified? Yes  Other? No  Any new allergies since your discharge? No  Dietary orders reviewed? Yes Do you have support at home? Yes   Home Care and Equipment/Supplies: Were home health services ordered? no If so, what is the name of the agency? N/a  Has the agency set up a time to come to the patient's home? no Were any new equipment or medical supplies ordered?  No What is the name of the medical supply agency? N/a Were you able to get the supplies/equipment? not applicable Do you have any questions related to the use of the equipment or supplies? No  Functional Questionnaire: (I = Independent and D = Dependent) ADLs: I  Bathing/Dressing- I  Meal Prep- I  Eating- I  Maintaining continence- I  Transferring/Ambulation- I  Managing Meds- I  Follow up appointments reviewed:  PCP Hospital f/u appt confirmed? Yes  Scheduled to see Petra Kuba, NP on 03/16/22 @ 3:00PM. Specialist Hospital f/u appt confirmed? No   Are transportation arrangements needed? No  If their condition worsens, is the pt aware to call PCP or go to the Emergency Dept.? Yes Was the patient provided with contact information for the PCP's office or ED? Yes Was to pt encouraged to call back with questions or concerns? Yes  SDOH assessments and interventions completed:   Yes  Care Coordination Interventions Activated:  Yes   Care Coordination Interventions:  No Care Coordination interventions needed at this time.   Encounter Outcome:  Pt. Visit Completed

## 2022-03-06 DIAGNOSIS — I25119 Atherosclerotic heart disease of native coronary artery with unspecified angina pectoris: Secondary | ICD-10-CM | POA: Diagnosis not present

## 2022-03-06 DIAGNOSIS — I5022 Chronic systolic (congestive) heart failure: Secondary | ICD-10-CM | POA: Diagnosis not present

## 2022-03-06 DIAGNOSIS — I2510 Atherosclerotic heart disease of native coronary artery without angina pectoris: Secondary | ICD-10-CM | POA: Diagnosis not present

## 2022-03-06 DIAGNOSIS — R0602 Shortness of breath: Secondary | ICD-10-CM | POA: Diagnosis not present

## 2022-03-13 ENCOUNTER — Ambulatory Visit: Admission: RE | Admit: 2022-03-13 | Payer: Medicare Other | Source: Home / Self Care | Admitting: Gastroenterology

## 2022-03-13 ENCOUNTER — Encounter: Admission: RE | Payer: Self-pay | Source: Home / Self Care

## 2022-03-13 SURGERY — IMAGING PROCEDURE, GI TRACT, INTRALUMINAL, VIA CAPSULE

## 2022-03-15 DIAGNOSIS — M25512 Pain in left shoulder: Secondary | ICD-10-CM | POA: Diagnosis not present

## 2022-03-15 DIAGNOSIS — G8929 Other chronic pain: Secondary | ICD-10-CM | POA: Diagnosis not present

## 2022-03-15 DIAGNOSIS — Z79891 Long term (current) use of opiate analgesic: Secondary | ICD-10-CM | POA: Diagnosis not present

## 2022-03-16 ENCOUNTER — Ambulatory Visit (INDEPENDENT_AMBULATORY_CARE_PROVIDER_SITE_OTHER): Payer: Medicare Other | Admitting: Family Medicine

## 2022-03-16 ENCOUNTER — Encounter: Payer: Self-pay | Admitting: Family Medicine

## 2022-03-16 VITALS — BP 100/60 | HR 70 | Temp 98.4°F | Ht 69.0 in | Wt 119.0 lb

## 2022-03-16 DIAGNOSIS — N401 Enlarged prostate with lower urinary tract symptoms: Secondary | ICD-10-CM

## 2022-03-16 DIAGNOSIS — I5022 Chronic systolic (congestive) heart failure: Secondary | ICD-10-CM | POA: Diagnosis not present

## 2022-03-16 DIAGNOSIS — I25118 Atherosclerotic heart disease of native coronary artery with other forms of angina pectoris: Secondary | ICD-10-CM

## 2022-03-16 DIAGNOSIS — E785 Hyperlipidemia, unspecified: Secondary | ICD-10-CM

## 2022-03-16 DIAGNOSIS — I714 Abdominal aortic aneurysm, without rupture, unspecified: Secondary | ICD-10-CM

## 2022-03-16 DIAGNOSIS — Z0000 Encounter for general adult medical examination without abnormal findings: Secondary | ICD-10-CM

## 2022-03-16 DIAGNOSIS — I739 Peripheral vascular disease, unspecified: Secondary | ICD-10-CM

## 2022-03-16 DIAGNOSIS — I10 Essential (primary) hypertension: Secondary | ICD-10-CM

## 2022-03-16 DIAGNOSIS — K219 Gastro-esophageal reflux disease without esophagitis: Secondary | ICD-10-CM

## 2022-03-16 DIAGNOSIS — R338 Other retention of urine: Secondary | ICD-10-CM

## 2022-03-16 NOTE — Progress Notes (Signed)
SUBJECTIVE:   Chief Complaint  Patient presents with   Transitions Of Care     Just got out Hospital    HPI Patient presents to clinic to transfer care  No acute concerns today  Patient was recently admitted to [facility] for GI bleed.    New medication list per patient Vitamin B12 1000 mcg daily Jardiance 10 mg daily Protonix 40 mg daily daily Albuterol inhaler Alfuzosin ER 10 mg with breakfast ASA 81 mg daily Atorvastatin 80 mg daily Baclofen 5 mg daily Vitamin D 1000 units daily Lasix 20 mg daily Gabapentin 300 mg 3 times a day Oxycodone 10-325 mg 4 times a day Imdur ER 60 mg 1 tablet in a.m. 1 tablet at noon and 2 tablets at 7 PM Lisinopril 2.5 mg daily Metoprolol XL 25 mg daily Vitamin B6 100 mg daily Thiamine 100 mg daily Nitro 0.4 mg sublingual as needed  Plavix and Goody powders were discontinued during hospitalization.  He was referred to cardiology pharmacy as well as [facility] well for GDMT titration and pharmacy assistance  Follows with cardiology and vascular surgery at [facility] for HFrEF, AAA, CAD, PAD, unstable angina.  PERTINENT PMH / PSH: AAA s/p repair CAD, multiple MIs/stent replacement/AICD HFrEF PVD GERD Hyperlipidemia Chronic pain Tobacco use   OBJECTIVE:  BP 100/60   Pulse 70   Temp 98.4 F (36.9 C) (Oral)   Ht 5\' 9"  (1.753 m)   Wt 119 lb (54 kg)   SpO2 96%   BMI 17.57 kg/m    Physical Exam Constitutional:      General: He is not in acute distress (underweight).    Appearance: He is not ill-appearing or toxic-appearing.  HENT:     Head: Normocephalic.     Mouth/Throat:     Mouth: Mucous membranes are moist.  Eyes:     Conjunctiva/sclera: Conjunctivae normal.  Neck:     Vascular: No carotid bruit or JVD.  Cardiovascular:     Rate and Rhythm: Normal rate and regular rhythm.     Pulses: Normal pulses.     Heart sounds: Normal heart sounds.  Pulmonary:     Effort: Pulmonary effort is normal.     Breath sounds:  Normal breath sounds.  Abdominal:     General: Abdomen is flat. Bowel sounds are normal. There is no distension.     Tenderness: There is no abdominal tenderness. There is no right CVA tenderness, left CVA tenderness or guarding.  Musculoskeletal:        General: Normal range of motion.     Right lower leg: No edema.     Left lower leg: No edema.  Skin:    General: Skin is warm and dry.  Neurological:     Mental Status: He is alert and oriented to person, place, and time. Mental status is at baseline.     Motor: No weakness.     Coordination: Coordination normal.  Psychiatric:        Mood and Affect: Mood normal.        Behavior: Behavior normal.        Thought Content: Thought content normal.        Judgment: Judgment normal.     ASSESSMENT/PLAN:  Essential hypertension Assessment & Plan: Chronic.  Well-controlled.  BP soft. Follows with cardiology. Continuing metoprolol XL 25 mg daily, lisinopril 2.5 mg daily, Lasix 20 mg daily   Orders: -     Comprehensive metabolic panel; Future -  Hemoglobin A1c; Future -     CBC with Differential/Platelet; Future -     Lipid panel; Future -     TSH; Future -     VITAMIN D 25 Hydroxy (Vit-D Deficiency, Fractures); Future -     Vitamin B12; Future  Annual physical exam  Abdominal aortic aneurysm (AAA) without rupture, unspecified part [facility]) Assessment & Plan: Chronic.  Asymptomatic. Follows with [facility] cardiology/vascular    Chronic systolic CHF (congestive heart failure) (HCC) Assessment & Plan: Chronic.  Asymptomatic.  Tolerating medications well.   Continue Jardiance 10 mg daily, Lasix 20 mg daily, Metoprolol XL 25 mg daily, Lisinopril 25 mg daily Follows with [facility] cardiology.    PAD (peripheral artery disease) (HCC) Assessment & Plan: Chronic.  Stable.  Plavix was discontinued at hospital discharge secondary to GI bleed. Following with vascular at [facility].   Gastroesophageal reflux disease without  esophagitis Assessment & Plan: Following with GI at [facility] Continue Protonix 40 mg daily   Coronary artery disease of native artery of native heart with stable angina pectoris [facility]) Assessment & Plan: Chronic.  Stable. Continue ASA 81 mg daily Continue Imdur 60 mg a.m., lunch and 2 PM Continue atorvastatin 80 mg daily Plavix was discontinued secondary to recent GI bleed Follow-up with cardiology    Benign prostatic hyperplasia with urinary retention Assessment & Plan: Continue Alfluzosin Zosyn ER 10 mg    Hyperlipidemia, unspecified hyperlipidemia type Assessment & Plan: Chronic.  Stable. Continue atorvastatin 80 mg daily Repeat lipids yearly    PDMP reviewed  Return for annual visit with fasting labs 1 week prior.  Dana Allan, MD

## 2022-03-16 NOTE — Patient Instructions (Addendum)
It was a pleasure meeting you today. Thank you for allowing me to take part in your health care.  Our goals for today as we discussed include:  I am glad you are feeling a little better.  Your medication list has now been updated.   Schedule lab appointment 1 week prior to your annual visit in August of next year   If you have any questions or concerns, please do not hesitate to call the office at [phone].  I look forward to our next visit and until then take care and stay safe.  Regards,   Dana Allan, MD   [facility]

## 2022-03-23 DIAGNOSIS — I739 Peripheral vascular disease, unspecified: Secondary | ICD-10-CM | POA: Diagnosis not present

## 2022-03-23 DIAGNOSIS — Z95828 Presence of other vascular implants and grafts: Secondary | ICD-10-CM | POA: Diagnosis not present

## 2022-03-23 DIAGNOSIS — I723 Aneurysm of iliac artery: Secondary | ICD-10-CM | POA: Diagnosis not present

## 2022-04-03 ENCOUNTER — Encounter: Payer: Self-pay | Admitting: Family Medicine

## 2022-04-03 NOTE — Assessment & Plan Note (Addendum)
Chronic.  Asymptomatic. Follows with [facility] cardiology/vascular

## 2022-04-03 NOTE — Assessment & Plan Note (Signed)
Chronic.  Stable. Continue atorvastatin 80 mg daily Repeat lipids yearly

## 2022-04-03 NOTE — Assessment & Plan Note (Signed)
Chronic.  Well-controlled.  BP soft. Follows with cardiology. Continuing metoprolol XL 25 mg daily, lisinopril 2.5 mg daily, Lasix 20 mg daily

## 2022-04-03 NOTE — Assessment & Plan Note (Addendum)
Chronic.  Asymptomatic.  Tolerating medications well.   Continue Jardiance 10 mg daily, Lasix 20 mg daily, Metoprolol XL 25 mg daily, Lisinopril 25 mg daily Follows with [facility] cardiology.

## 2022-04-03 NOTE — Assessment & Plan Note (Addendum)
Chronic.  Stable.  Plavix was discontinued at hospital discharge secondary to GI bleed. Following with vascular at [facility].

## 2022-04-03 NOTE — Assessment & Plan Note (Signed)
Following with GI at [facility] Continue Protonix 40 mg daily

## 2022-04-03 NOTE — Assessment & Plan Note (Signed)
Continue Alfluzosin Zosyn ER 10 mg

## 2022-04-03 NOTE — Assessment & Plan Note (Addendum)
Chronic.  Stable. Continue ASA 81 mg daily Continue Imdur 60 mg a.m., lunch and 2 PM Continue atorvastatin 80 mg daily Plavix was discontinued secondary to recent GI bleed Follow-up with cardiology

## 2022-04-19 DIAGNOSIS — M48062 Spinal stenosis, lumbar region with neurogenic claudication: Secondary | ICD-10-CM | POA: Diagnosis not present

## 2022-04-19 DIAGNOSIS — Z79891 Long term (current) use of opiate analgesic: Secondary | ICD-10-CM | POA: Diagnosis not present

## 2022-04-19 DIAGNOSIS — G8929 Other chronic pain: Secondary | ICD-10-CM | POA: Diagnosis not present

## 2022-04-19 DIAGNOSIS — M25512 Pain in left shoulder: Secondary | ICD-10-CM | POA: Diagnosis not present

## 2022-05-03 ENCOUNTER — Other Ambulatory Visit: Payer: Self-pay

## 2022-05-03 DIAGNOSIS — E531 Pyridoxine deficiency: Secondary | ICD-10-CM

## 2022-05-03 MED ORDER — VITAMIN B-6 100 MG PO TABS: 100.0000 mg | ORAL_TABLET | Freq: Every day | ORAL | 3 refills | Status: DC

## 2022-05-15 DIAGNOSIS — M5416 Radiculopathy, lumbar region: Secondary | ICD-10-CM | POA: Diagnosis not present

## 2022-05-18 ENCOUNTER — Other Ambulatory Visit: Payer: Self-pay

## 2022-05-18 DIAGNOSIS — G8929 Other chronic pain: Secondary | ICD-10-CM

## 2022-05-18 MED ORDER — BACLOFEN 5 MG PO TABS: 1.0000 | ORAL_TABLET | Freq: Two times a day (BID) | ORAL | 3 refills | Status: DC

## 2022-05-24 ENCOUNTER — Other Ambulatory Visit: Payer: Self-pay

## 2022-05-24 DIAGNOSIS — G4700 Insomnia, unspecified: Secondary | ICD-10-CM

## 2022-05-24 MED ORDER — ZOLPIDEM TARTRATE 10 MG PO TABS: 10.0000 mg | ORAL_TABLET | Freq: Every evening | ORAL | 0 refills | Status: DC | PRN

## 2022-05-25 DIAGNOSIS — M545 Low back pain, unspecified: Secondary | ICD-10-CM | POA: Diagnosis not present

## 2022-05-25 DIAGNOSIS — M25512 Pain in left shoulder: Secondary | ICD-10-CM | POA: Diagnosis not present

## 2022-05-25 DIAGNOSIS — Z79891 Long term (current) use of opiate analgesic: Secondary | ICD-10-CM | POA: Diagnosis not present

## 2022-05-25 DIAGNOSIS — G8929 Other chronic pain: Secondary | ICD-10-CM | POA: Diagnosis not present

## 2022-06-06 ENCOUNTER — Other Ambulatory Visit: Payer: Self-pay | Admitting: Family Medicine

## 2022-06-06 DIAGNOSIS — G4700 Insomnia, unspecified: Secondary | ICD-10-CM

## 2022-06-22 ENCOUNTER — Other Ambulatory Visit: Payer: Self-pay | Admitting: Nurse Practitioner

## 2022-06-22 ENCOUNTER — Ambulatory Visit
Admission: RE | Admit: 2022-06-22 | Discharge: 2022-06-22 | Disposition: A | Discharge status: Home / Self Care | Payer: Medicare Other | Source: Ambulatory Visit | Attending: Nurse Practitioner | Admitting: Nurse Practitioner

## 2022-06-22 DIAGNOSIS — Z79891 Long term (current) use of opiate analgesic: Secondary | ICD-10-CM | POA: Diagnosis not present

## 2022-06-22 DIAGNOSIS — M25552 Pain in left hip: Secondary | ICD-10-CM

## 2022-06-22 DIAGNOSIS — G8929 Other chronic pain: Secondary | ICD-10-CM | POA: Diagnosis not present

## 2022-06-22 DIAGNOSIS — M48062 Spinal stenosis, lumbar region with neurogenic claudication: Secondary | ICD-10-CM

## 2022-06-22 DIAGNOSIS — M25512 Pain in left shoulder: Secondary | ICD-10-CM | POA: Diagnosis not present

## 2022-06-22 DIAGNOSIS — M542 Cervicalgia: Secondary | ICD-10-CM | POA: Diagnosis not present

## 2022-06-26 ENCOUNTER — Ambulatory Visit
Admission: RE | Admit: 2022-06-26 | Discharge: 2022-06-26 | Disposition: A | Discharge status: Home / Self Care | Payer: Medicare Other | Source: Ambulatory Visit | Attending: Nurse Practitioner | Admitting: Nurse Practitioner

## 2022-06-26 DIAGNOSIS — M545 Low back pain, unspecified: Secondary | ICD-10-CM | POA: Diagnosis not present

## 2022-06-26 DIAGNOSIS — M48062 Spinal stenosis, lumbar region with neurogenic claudication: Secondary | ICD-10-CM

## 2022-06-26 DIAGNOSIS — M48061 Spinal stenosis, lumbar region without neurogenic claudication: Secondary | ICD-10-CM | POA: Diagnosis not present

## 2022-06-29 DIAGNOSIS — I70223 Atherosclerosis of native arteries of extremities with rest pain, bilateral legs: Secondary | ICD-10-CM | POA: Diagnosis not present

## 2022-06-29 DIAGNOSIS — Z95820 Peripheral vascular angioplasty status with implants and grafts: Secondary | ICD-10-CM | POA: Diagnosis not present

## 2022-06-29 DIAGNOSIS — R109 Unspecified abdominal pain: Secondary | ICD-10-CM | POA: Diagnosis not present

## 2022-06-29 DIAGNOSIS — R5381 Other malaise: Secondary | ICD-10-CM | POA: Diagnosis not present

## 2022-06-29 DIAGNOSIS — I739 Peripheral vascular disease, unspecified: Secondary | ICD-10-CM | POA: Diagnosis not present

## 2022-06-29 DIAGNOSIS — I255 Ischemic cardiomyopathy: Secondary | ICD-10-CM | POA: Diagnosis not present

## 2022-06-29 DIAGNOSIS — Z452 Encounter for adjustment and management of vascular access device: Secondary | ICD-10-CM | POA: Diagnosis not present

## 2022-06-29 DIAGNOSIS — K56609 Unspecified intestinal obstruction, unspecified as to partial versus complete obstruction: Secondary | ICD-10-CM | POA: Diagnosis not present

## 2022-06-29 DIAGNOSIS — R1084 Generalized abdominal pain: Secondary | ICD-10-CM | POA: Diagnosis not present

## 2022-06-29 DIAGNOSIS — I723 Aneurysm of iliac artery: Secondary | ICD-10-CM | POA: Diagnosis not present

## 2022-06-29 DIAGNOSIS — T82868A Thrombosis of vascular prosthetic devices, implants and grafts, initial encounter: Secondary | ICD-10-CM | POA: Diagnosis not present

## 2022-06-29 DIAGNOSIS — I82422 Acute embolism and thrombosis of left iliac vein: Secondary | ICD-10-CM | POA: Diagnosis not present

## 2022-06-29 DIAGNOSIS — K565 Intestinal adhesions [bands], unspecified as to partial versus complete obstruction: Secondary | ICD-10-CM | POA: Diagnosis not present

## 2022-06-29 DIAGNOSIS — I110 Hypertensive heart disease with heart failure: Secondary | ICD-10-CM | POA: Diagnosis not present

## 2022-06-29 DIAGNOSIS — Z4682 Encounter for fitting and adjustment of non-vascular catheter: Secondary | ICD-10-CM | POA: Diagnosis not present

## 2022-06-29 DIAGNOSIS — K56699 Other intestinal obstruction unspecified as to partial versus complete obstruction: Secondary | ICD-10-CM | POA: Diagnosis not present

## 2022-06-29 DIAGNOSIS — Z955 Presence of coronary angioplasty implant and graft: Secondary | ICD-10-CM | POA: Diagnosis not present

## 2022-06-29 DIAGNOSIS — I2510 Atherosclerotic heart disease of native coronary artery without angina pectoris: Secondary | ICD-10-CM | POA: Diagnosis not present

## 2022-06-29 DIAGNOSIS — I252 Old myocardial infarction: Secondary | ICD-10-CM | POA: Diagnosis not present

## 2022-06-29 DIAGNOSIS — F17210 Nicotine dependence, cigarettes, uncomplicated: Secondary | ICD-10-CM | POA: Diagnosis not present

## 2022-06-29 DIAGNOSIS — Z95810 Presence of automatic (implantable) cardiac defibrillator: Secondary | ICD-10-CM | POA: Diagnosis not present

## 2022-06-29 DIAGNOSIS — Z681 Body mass index (BMI) 19 or less, adult: Secondary | ICD-10-CM | POA: Diagnosis not present

## 2022-06-29 DIAGNOSIS — I5020 Unspecified systolic (congestive) heart failure: Secondary | ICD-10-CM | POA: Diagnosis not present

## 2022-06-29 DIAGNOSIS — K5652 Intestinal adhesions [bands] with complete obstruction: Secondary | ICD-10-CM | POA: Diagnosis not present

## 2022-06-29 DIAGNOSIS — K219 Gastro-esophageal reflux disease without esophagitis: Secondary | ICD-10-CM | POA: Diagnosis not present

## 2022-06-29 DIAGNOSIS — Z7902 Long term (current) use of antithrombotics/antiplatelets: Secondary | ICD-10-CM | POA: Diagnosis not present

## 2022-06-29 DIAGNOSIS — E440 Moderate protein-calorie malnutrition: Secondary | ICD-10-CM | POA: Diagnosis not present

## 2022-06-29 DIAGNOSIS — Z01810 Encounter for preprocedural cardiovascular examination: Secondary | ICD-10-CM | POA: Diagnosis not present

## 2022-06-29 DIAGNOSIS — Z7982 Long term (current) use of aspirin: Secondary | ICD-10-CM | POA: Diagnosis not present

## 2022-06-29 DIAGNOSIS — E785 Hyperlipidemia, unspecified: Secondary | ICD-10-CM | POA: Diagnosis not present

## 2022-06-29 DIAGNOSIS — I5022 Chronic systolic (congestive) heart failure: Secondary | ICD-10-CM | POA: Diagnosis not present

## 2022-06-30 DIAGNOSIS — Z01810 Encounter for preprocedural cardiovascular examination: Secondary | ICD-10-CM | POA: Diagnosis not present

## 2022-06-30 DIAGNOSIS — K56609 Unspecified intestinal obstruction, unspecified as to partial versus complete obstruction: Secondary | ICD-10-CM | POA: Diagnosis not present

## 2022-06-30 DIAGNOSIS — I5020 Unspecified systolic (congestive) heart failure: Secondary | ICD-10-CM | POA: Diagnosis not present

## 2022-07-02 DIAGNOSIS — K565 Intestinal adhesions [bands], unspecified as to partial versus complete obstruction: Secondary | ICD-10-CM | POA: Diagnosis not present

## 2022-07-02 DIAGNOSIS — K56699 Other intestinal obstruction unspecified as to partial versus complete obstruction: Secondary | ICD-10-CM | POA: Diagnosis not present

## 2022-07-03 ENCOUNTER — Other Ambulatory Visit: Payer: Self-pay | Admitting: Family

## 2022-07-03 ENCOUNTER — Telehealth: Payer: Self-pay

## 2022-07-03 DIAGNOSIS — K56609 Unspecified intestinal obstruction, unspecified as to partial versus complete obstruction: Secondary | ICD-10-CM | POA: Diagnosis not present

## 2022-07-03 MED ORDER — MIRTAZAPINE 30 MG PO TABS: 30.0000 mg | ORAL_TABLET | Freq: Every day | ORAL | 0 refills | Status: DC

## 2022-07-05 DIAGNOSIS — K56609 Unspecified intestinal obstruction, unspecified as to partial versus complete obstruction: Secondary | ICD-10-CM | POA: Diagnosis not present

## 2022-07-12 ENCOUNTER — Telehealth: Payer: Self-pay | Admitting: *Deleted

## 2022-07-12 NOTE — Transitions of Care (Post Inpatient/ED Visit) (Signed)
   07/12/2022  Name: Martin Hernandez [facility]. MRN: [id number] DOB: 04-22-1955  Today's TOC FU Call Status: Today's TOC FU Call Status:: Unsuccessful Call (2nd Attempt) Unsuccessful Call (2nd Attempt) Date: 07/12/22  Attempted to reach the patient regarding the most recent Inpatient/ED visit.  Follow Up Plan: Additional outreach attempts will be made to reach the patient to complete the Transitions of Care (Post Inpatient/ED visit) call.   [place] Care Management [phone]

## 2022-07-12 NOTE — Transitions of Care (Post Inpatient/ED Visit) (Signed)
   07/12/2022  Name: Martin Hernandez [facility]. MRN: [id number] DOB: 1955-11-29  Today's TOC FU Call Status: Today's TOC FU Call Status:: Unsuccessul Call (1st Attempt) Unsuccessful Call (1st Attempt) Date: 07/12/22  Attempted to reach the patient regarding the most recent Inpatient/ED visit.  Follow Up Plan: Additional outreach attempts will be made to reach the patient to complete the Transitions of Care (Post Inpatient/ED visit) call.   [place] Care Management [phone]

## 2022-07-13 ENCOUNTER — Telehealth: Payer: Self-pay | Admitting: *Deleted

## 2022-07-13 NOTE — Transitions of Care (Post Inpatient/ED Visit) (Signed)
   07/13/2022  Name: Martin Hernandez [facility]. MRN: [id number] DOB: 12-Nov-1955  Today's TOC FU Call Status: Today's TOC FU Call Status:: Successful TOC FU Call Competed TOC FU Call Complete Date: 07/13/22  Transition Care Management Follow-up Telephone Call Date of Discharge: 07/11/22 Discharge Facility: Other [employer] [facility] buildings) Name of Other (Non[facility]) Discharge Facility: [facility] Type of Discharge: Inpatient Admission Primary Inpatient Discharge Diagnosis:: SBO How have you been since you were released from the hospital?: Better (But very sore and its's a slow process. My legs are swelling. They said I had a blood clot. They have called me and set a date for me to come in.) Any questions or concerns?: No  Items Reviewed: Did you receive and understand the discharge instructions provided?: Yes Medications obtained and verified?: Yes (Medications Reviewed) Any new allergies since your discharge?: No Dietary orders reviewed?: No Do you have support at home?: Yes People in Home: spouse Name of Support/Comfort Primary Source: Reneish w and grand daughter  Home Care and Equipment/Supplies: [place] Ordered?: No Any new equipment or medical supplies ordered?: No  Functional Questionnaire: Do you need assistance with bathing/showering or dressing?: Yes Do you need assistance with meal preparation?: Yes Do you need assistance with eating?: No Do you have difficulty maintaining continence: No Do you need assistance with getting out of bed/getting out of a chair/moving?: No Do you have difficulty managing or taking your medications?: No  Follow up appointments reviewed: PCP Follow-up appointment confirmed?: [facility] Follow-up appointment confirmed?: Yes Date of Specialist follow-up appointment?: 07/24/22 Follow-Up Specialty Provider:: Gen Surgeon Do you need transportation to your follow-up appointment?: No Do you understand care options if your  condition(s) worsen?: Yes-patient verbalized understanding  SDOH Interventions Today    Flowsheet Row Most Recent Value  SDOH Interventions   Food Insecurity Interventions Intervention Not Indicated  Housing Interventions Intervention Not Indicated  Transportation Interventions Intervention Not Indicated      Interventions Today    Flowsheet Row Most Recent Value  General Interventions   General Interventions Discussed/Reviewed General Interventions Discussed, General Interventions Reviewed, Doctor Visits  Doctor Visits Discussed/Reviewed Doctor Visits Discussed, Specialist  PCP/Specialist Visits Compliance with follow-up visit  Pharmacy Interventions   Pharmacy Dicussed/Reviewed Pharmacy Topics Discussed  [Per patient he taking his eliquis]         [place] Management [phone]

## 2022-07-19 ENCOUNTER — Telehealth: Payer: Self-pay | Admitting: Family Medicine

## 2022-07-19 NOTE — Telephone Encounter (Signed)
Contacted [facility] [place]. to schedule their annual wellness visit. Appointment made for 07/20/2022.  Thank you,  [facility] Support [facility] Direct dial  [phone]

## 2022-07-20 ENCOUNTER — Telehealth: Payer: Self-pay

## 2022-07-20 DIAGNOSIS — Z79891 Long term (current) use of opiate analgesic: Secondary | ICD-10-CM | POA: Diagnosis not present

## 2022-07-20 DIAGNOSIS — M48062 Spinal stenosis, lumbar region with neurogenic claudication: Secondary | ICD-10-CM | POA: Diagnosis not present

## 2022-07-20 DIAGNOSIS — G8929 Other chronic pain: Secondary | ICD-10-CM | POA: Diagnosis not present

## 2022-07-20 NOTE — Telephone Encounter (Signed)
No answer when called for scheduled AWV. Left message. Okay to reschedule.  

## 2022-07-25 DIAGNOSIS — Z4802 Encounter for removal of sutures: Secondary | ICD-10-CM | POA: Diagnosis not present

## 2022-07-25 DIAGNOSIS — Z9889 Other specified postprocedural states: Secondary | ICD-10-CM | POA: Diagnosis not present

## 2022-07-27 ENCOUNTER — Other Ambulatory Visit: Payer: Self-pay

## 2022-07-27 ENCOUNTER — Ambulatory Visit (INDEPENDENT_AMBULATORY_CARE_PROVIDER_SITE_OTHER): Payer: Medicare Other

## 2022-07-27 VITALS — Ht 69.0 in | Wt 109.0 lb

## 2022-07-27 DIAGNOSIS — Z0000 Encounter for general adult medical examination without abnormal findings: Secondary | ICD-10-CM

## 2022-07-27 DIAGNOSIS — G4700 Insomnia, unspecified: Secondary | ICD-10-CM

## 2022-07-27 MED ORDER — ZOLPIDEM TARTRATE 10 MG PO TABS: 10.0000 mg | ORAL_TABLET | Freq: Every evening | ORAL | 0 refills | Status: DC | PRN

## 2022-07-27 NOTE — Patient Instructions (Addendum)
Martin Hernandez , Thank you for taking time to come for your Medicare Wellness Visit. I appreciate your ongoing commitment to your health goals. Please review the following plan we discussed and let me know if I can assist you in the future.   These are the goals we discussed:  Goals      Follow up with Primary Care Provider     As needed. Complete Advanced Directive        This is a list of the screening recommended for you and due dates:  Health Maintenance  Topic Date Due   DTaP/Tdap/Td vaccine (1 - Tdap) Never done   COVID-19 Vaccine (4 - 2023-24 season) 08/12/2022*   Flu Shot  11/09/2022   Medicare Annual Wellness Visit  07/27/2023   Colon Cancer Screening  02/13/2032   Hepatitis C Screening: USPSTF Recommendation to screen - Ages 18-79 yo.  Completed   HPV Vaccine  Aged Out   Pneumonia Vaccine  Discontinued   Zoster (Shingles) Vaccine  Discontinued  *Topic was postponed. The date shown is not the original due date.    Advanced directives: Not yet completed  Conditions/risks identified: none new  Next appointment: Follow up in one year for your annual wellness visit.   Preventive Care 70 Years and Older, Male  Preventive care refers to lifestyle choices and visits with your health care provider that can promote health and wellness. What does preventive care include? A yearly physical exam. This is also called an annual well check. Dental exams once or twice a year. Routine eye exams. Ask your health care provider how often you should have your eyes checked. Personal lifestyle choices, including: Daily care of your teeth and gums. Regular physical activity. Eating a healthy diet. Avoiding tobacco and drug use. Limiting alcohol use. Practicing safe sex. Taking low doses of aspirin every day. Taking vitamin and mineral supplements as recommended by your health care provider. What happens during an annual well check? The services and screenings done by your health  care provider during your annual well check will depend on your age, overall health, lifestyle risk factors, and family history of disease. Counseling  Your health care provider may ask you questions about your: Alcohol use. Tobacco use. Drug use. Emotional well-being. Home and relationship well-being. Sexual activity. Eating habits. History of falls. Memory and ability to understand (cognition). Work and work Astronomer. Screening  You may have the following tests or measurements: Height, weight, and BMI. Blood pressure. Lipid and cholesterol levels. These may be checked every 5 years, or more frequently if you are over 42 years old. Skin check. Lung cancer screening. You may have this screening every year starting at age 21 if you have a 30-pack-year history of smoking and currently smoke or have quit within the past 15 years. Fecal occult blood test (FOBT) of the stool. You may have this test every year starting at age 81. Flexible sigmoidoscopy or colonoscopy. You may have a sigmoidoscopy every 5 years or a colonoscopy every 10 years starting at age 39. Prostate cancer screening. Recommendations will vary depending on your family history and other risks. Hepatitis C blood test. Hepatitis B blood test. Sexually transmitted disease (STD) testing. Diabetes screening. This is done by checking your blood sugar (glucose) after you have not eaten for a while (fasting). You may have this done every 1-3 years. Abdominal aortic aneurysm (AAA) screening. You may need this if you are a current or former smoker. Osteoporosis. You may be screened starting  at age 57 if you are at high risk. Talk with your health care provider about your test results, treatment options, and if necessary, the need for more tests. Vaccines  Your health care provider may recommend certain vaccines, such as: Influenza vaccine. This is recommended every year. Tetanus, diphtheria, and acellular pertussis (Tdap, Td)  vaccine. You may need a Td booster every 10 years. Zoster vaccine. You may need this after age 82. Pneumococcal 13-valent conjugate (PCV13) vaccine. One dose is recommended after age 41. Pneumococcal polysaccharide (PPSV23) vaccine. One dose is recommended after age 52. Talk to your health care provider about which screenings and vaccines you need and how often you need them. This information is not intended to replace advice given to you by your health care provider. Make sure you discuss any questions you have with your health care provider. Document Released: 04/23/2015 Document Revised: 12/15/2015 Document Reviewed: 01/26/2015 Elsevier Interactive Patient Education  2017 [place] Prevention in the Home Falls can cause injuries. They can happen to people of all ages. There are many things you can do to make your home safe and to help prevent falls. What can I do on the outside of my home? Regularly fix the edges of walkways and driveways and fix any cracks. Remove anything that might make you trip as you walk through a door, such as a raised step or threshold. Trim any bushes or trees on the path to your home. Use bright outdoor lighting. Clear any walking paths of anything that might make someone trip, such as rocks or tools. Regularly check to see if handrails are loose or broken. Make sure that both sides of any steps have handrails. Any raised decks and porches should have guardrails on the edges. Have any leaves, snow, or ice cleared regularly. Use sand or salt on walking paths during winter. Clean up any spills in your garage right away. This includes oil or grease spills. What can I do in the bathroom? Use night lights. Install grab bars by the toilet and in the tub and shower. Do not use towel bars as grab bars. Use non-skid mats or decals in the tub or shower. If you need to sit down in the shower, use a plastic, non-slip stool. Keep the floor dry. Clean up any  water that spills on the floor as soon as it happens. Remove soap buildup in the tub or shower regularly. Attach bath mats securely with double-sided non-slip rug tape. Do not have throw rugs and other things on the floor that can make you trip. What can I do in the bedroom? Use night lights. Make sure that you have a light by your bed that is easy to reach. Do not use any sheets or blankets that are too big for your bed. They should not hang down onto the floor. Have a firm chair that has side arms. You can use this for support while you get dressed. Do not have throw rugs and other things on the floor that can make you trip. What can I do in the kitchen? Clean up any spills right away. Avoid walking on wet floors. Keep items that you use a lot in easy-to-reach places. If you need to reach something above you, use a strong step stool that has a grab bar. Keep electrical cords out of the way. Do not use floor polish or wax that makes floors slippery. If you must use wax, use non-skid floor wax. Do not have throw rugs  and other things on the floor that can make you trip. What can I do with my stairs? Do not leave any items on the stairs. Make sure that there are handrails on both sides of the stairs and use them. Fix handrails that are broken or loose. Make sure that handrails are as long as the stairways. Check any carpeting to make sure that it is firmly attached to the stairs. Fix any carpet that is loose or worn. Avoid having throw rugs at the top or bottom of the stairs. If you do have throw rugs, attach them to the floor with carpet tape. Make sure that you have a light switch at the top of the stairs and the bottom of the stairs. If you do not have them, ask someone to add them for you. What else can I do to help prevent falls? Wear shoes that: Do not have high heels. Have rubber bottoms. Are comfortable and fit you well. Are closed at the toe. Do not wear sandals. If you use a  stepladder: Make sure that it is fully opened. Do not climb a closed stepladder. Make sure that both sides of the stepladder are locked into place. Ask someone to hold it for you, if possible. Clearly mark and make sure that you can see: Any grab bars or handrails. First and last steps. Where the edge of each step is. Use tools that help you move around (mobility aids) if they are needed. These include: Canes. Walkers. Scooters. Crutches. Turn on the lights when you go into a dark area. Replace any light bulbs as soon as they burn out. Set up your furniture so you have a clear path. Avoid moving your furniture around. If any of your floors are uneven, fix them. If there are any pets around you, be aware of where they are. Review your medicines with your doctor. Some medicines can make you feel dizzy. This can increase your chance of falling. Ask your doctor what other things that you can do to help prevent falls. This information is not intended to replace advice given to you by your health care provider. Make sure you discuss any questions you have with your health care provider. Document Released: 01/21/2009 Document Revised: 09/02/2015 Document Reviewed: 05/01/2014 Elsevier Interactive Patient Education  2017 [employer].

## 2022-07-27 NOTE — Telephone Encounter (Signed)
Patient request refill on medication zolpidem (AMBIEN) 10 MG tablet. Last prescribed 05/24/22, 0 refills.  Pended for your approval.

## 2022-07-27 NOTE — Progress Notes (Addendum)
Subjective:   Martin Gjerde Frances Joynt. is a 67 y.o. male who presents for Medicare Annual/Subsequent preventive examination.  Review of Systems    No ROS.  Medicare Wellness Virtual Visit.  Visual/audio telehealth visit, UTA vital signs.   See social history for additional risk factors.   Cardiac Risk Factors include: advanced age (>27men, >22 women);male gender     Objective:    Today's Vitals   07/27/22 0853  Weight: 109 lb (49.4 kg)  Height: 5\' 9"  (1.753 m)   Body mass index is 16.1 kg/m.     07/27/2022    9:04 AM 02/09/2022   10:57 AM 09/13/2021    8:49 AM 05/24/2021    9:58 AM 04/22/2020    9:42 AM 04/22/2019    9:51 AM 04/18/2018   10:41 PM  Advanced Directives  Does Patient Have a Medical Advance Directive? No No No No Yes No No  Type of Advance Directive     Healthcare Power of Attorney    Does patient want to make changes to medical advance directive?     No - Patient declined    Copy of Healthcare Power of Attorney in Chart?     No - copy requested    Would patient like information on creating a medical advance directive? No - Patient declined  No - Patient declined No - Patient declined  Yes (MAU/Ambulatory/Procedural Areas - Information given) No - Patient declined    Current Medications (verified) Outpatient Encounter Medications as of 07/27/2022  Medication Sig   alfuzosin (UROXATRAL) 10 MG 24 hr tablet Take 1 tablet (10 mg total) by mouth daily with breakfast. Dc flomax 0.4   aspirin 81 MG tablet Take 81 mg by mouth daily.   atorvastatin (LIPITOR) 80 MG tablet Take 1 tablet (80 mg total) by mouth daily at 6pm.   Baclofen 5 MG TABS Take 1 tablet (5 mg total) by mouth 2 (two) times daily.   empagliflozin (JARDIANCE) 10 MG TABS tablet Take 10 mg by mouth daily.   furosemide (LASIX) 20 MG tablet Take 20 mg by mouth daily.   gabapentin (NEURONTIN) 300 MG capsule Take 300 mg by mouth 3 (three) times daily.   ipratropium (ATROVENT) 0.02 % nebulizer solution  Inhale into the lungs.   isosorbide mononitrate (IMDUR) 60 MG 24 hr tablet Take 1 tablet by mouth in the morning, at noon, and at bedtime. Take 1 tablet by mouth in the morning at noon and 2 tablets at 7 PM   lisinopril (ZESTRIL) 2.5 MG tablet Take 2.5 mg by mouth daily.   metoprolol succinate (TOPROL-XL) 25 MG 24 hr tablet Take 1 tablet (25 mg total) by mouth daily.   mirtazapine (REMERON) 30 MG tablet Take 1 tablet (30 mg total) by mouth at bedtime.   nitroGLYCERIN (NITROSTAT) 0.4 MG SL tablet Place 1 tablet (0.4 mg total) under the tongue every 5 (five) minutes as needed for chest pain.   oxyCODONE-acetaminophen (PERCOCET) 10-325 MG tablet Take 1 tablet by mouth as needed.   pantoprazole (PROTONIX) 40 MG tablet Take 1 tablet by mouth daily 30 minutes before food.   pyridOXINE (VITAMIN B6) 100 MG tablet Take 1 tablet (100 mg total) by mouth daily.   thiamine (VITAMIN B-1) 100 MG tablet Take 100 mg by mouth daily.   zolpidem (AMBIEN) 10 MG tablet Take 1 tablet (10 mg total) by mouth at bedtime as needed.   No facility-administered encounter medications on file as of 07/27/2022.    Allergies (  verified) Patient has no known allergies.   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 Normal size of the aortic root, ascending aorta, and descending  thoracic aorta. No dissection or pseudoaneurysm. Mild to moderate scattered  calcified plaque. The aortic branches are patent with mild narrowing of the  left common carotid and left subclavian   Bilateral lower abdominal pain  02/15/2016   CAD (coronary artery disease)    Cervical arthritis 10/17/2019   Cervical x-rays (AP, lateral, and oblique views) are obtained.  These  films demonstrate moderate degenerative changes at C3-4, C4-5, C5-6 and  C6-7 as manifest by moderate narrowing of the disk space(s), osteophyte  formation anteriorly and foraminal osteophytes.  There is no evidence for  spondylolysis or spondylolisthesis.  No lytic lesions or fractures are  identified.  Specimen Collected   Cervicalgia 06/07/2021   Chest pain 02/09/2022   CHF (congestive heart failure), NYHA class III 03/05/2013   Overview:   On max medical MGMT from 2013 thru 02/2013   Chronic abdominal pain 01/15/2018   Chronic pain of both shoulders 07/02/2019   Cramping of hands 08/28/2016   Dental abscess 05/30/2018   Depression    Disorder of right eustachian tube 02/27/2018   Early satiety 02/25/2017   Encounter for fitting or adjustment of automatic implantable cardioverter-defibrillator 06/30/2013   Frequent headaches    Gastric erosion 02/12/2022   Gastritis without bleeding 01/15/2018   Gastrointestinal hemorrhage with melena 02/21/2022   GERD (gastroesophageal reflux disease)    Headache    Heart failure 03/05/2013   Overview:   On max medical MGMT from 2013 thru 02/2013   Hx of aortic aneurysm repair 07/09/2020   Hyperlipidemia    Hypertension    Iliac aneurysm 04/20/2020   Insomnia 12/07/2014   Iron deficiency anemia due to chronic blood loss 02/11/2022   Kidney stone 07/02/2019   Monocytosis 11/03/2020   Non-sustained ventricular tachycardia 02/20/2013   Overview:   As seen on recent holter.   PAD (peripheral artery disease)    Periodontal disease 05/30/2018   Peripheral arterial occlusive disease 06/27/2011   Overview:   Overview:   Overview:   A. Jan 2012 CTA: Right iliac artery occlusion; left common and internal iliac artery aneurysms by [facility]. ABIs and dopplers without hemodynamically significant  stenoses at that time.   B. 08/2011 run off in conjunction with cath. 40% left common iliac artery stenosis with no hemodynamically significant pressure gradient, and a left external iliac and left in   Status post abdominal aortic aneurysm repair 03/29/2015   Overview:   2009, [facility]   Symptomatic anemia 02/09/2022   Tobacco abuse    Unstable angina 11/03/2015   Urinary retention 03/29/2015   Weight loss 09/24/2015   Past Surgical History:  Procedure Laterality Date   CATARACT EXTRACTION W/PHACO Right 09/13/2021   Procedure: CATARACT EXTRACTION PHACO AND INTRAOCULAR LENS PLACEMENT (IOC) RIGHT;  Surgeon: Galen Manila, MD;  Location: [facility];  Service: Ophthalmology;  Laterality: Right;  3.53 0:43.1   CATARACT EXTRACTION W/PHACO Left 09/27/2021  Procedure: CATARACT EXTRACTION PHACO AND INTRAOCULAR LENS PLACEMENT (IOC) LEFT 4.79 00:25.3;  Surgeon: Galen Manila, MD;  Location: [facility];  Service: Ophthalmology;  Laterality: Left;   COLONOSCOPY WITH PROPOFOL N/A 06/20/2017   Procedure: COLONOSCOPY WITH PROPOFOL;  Surgeon: Wyline Mood, MD;  Location: [facility] ENDOSCOPY;  Service: Gastroenterology;  Laterality: N/A;   COLONOSCOPY WITH PROPOFOL N/A 07/24/2017   Procedure: COLONOSCOPY WITH PROPOFOL;  Surgeon: Wyline Mood, MD;  Location: [facility] ENDOSCOPY;  Service: Gastroenterology;  Laterality: N/A;   COLONOSCOPY WITH PROPOFOL N/A 02/12/2022   Procedure: COLONOSCOPY WITH PROPOFOL;  Surgeon: Toney Reil, MD;  Location: [facility] ENDOSCOPY;  Service: Gastroenterology;  Laterality: N/A;   ESOPHAGOGASTRODUODENOSCOPY N/A 02/12/2022   Procedure: ESOPHAGOGASTRODUODENOSCOPY (EGD);  Surgeon: Toney Reil, MD;  Location: [facility] ENDOSCOPY;  Service: Gastroenterology;  Laterality: N/A;   ESOPHAGOGASTRODUODENOSCOPY (EGD) WITH PROPOFOL N/A 06/20/2017   Procedure: ESOPHAGOGASTRODUODENOSCOPY (EGD) WITH PROPOFOL;  Surgeon: Wyline Mood, MD;  Location: [facility] END[facility]COPY;  Service:  Gastroenterology;  Laterality: N/A;   PERCUTANEOUS CORONARY STENT INTERVENTION (PCI-S)     STOMACH SURGERY     2009 [facility] per chart review pt had AAA repair    TONSILLECTOMY     Family History  Problem Relation Age of Onset   Diabetes Mother    Heart disease Mother    Hypertension Mother    Stroke Mother    Prostate cancer Father    Cancer Father        prostate cancer in 58s   Diabetes Sister    Heart disease Sister    Ulcers Other        unknown who had ulcers in family    Social History   Socioeconomic History   Marital status: Married    Spouse name: Not on file   Number of children: Not on file   Years of education: Not on file   Highest education level: Not on file  Occupational History   Not on file  Tobacco Use   Smoking status: Some Days    Packs/day: 0.25    Years: 50.00    Additional pack years: 0.00    Total pack years: 12.50    Types: Cigarettes    Passive exposure: Current   Smokeless tobacco: Never   Tobacco comments:    Started smoking age 69  Vaping Use   Vaping Use: Never used  Substance and Sexual Activity   Alcohol use: No    Alcohol/week: 0.0 standard drinks of alcohol    Comment: quit 1 year ago   Drug use: No   Sexual activity: Not Currently  Other Topics Concern   Not on file  Social History Narrative   Disabled    4 kids 2 living    Used to do mill work    Smoker since age 1 max 2 pk per week now 1 ppd as of 02/2017    Married    Social Determinants of Health   Financial Resource Strain: Low Risk  (07/27/2022)   Overall Financial Resource Strain (CARDIA)    Difficulty of Paying Living Expenses: Not hard at all  Food Insecurity: No Food Insecurity (07/27/2022)   Hunger Vital Sign    Worried About Running Out of Food in the Last Year: Never true    Ran Out of Food in the Last Year: Never true  Transportation Needs: No Transportation Needs (07/27/2022)   PRAPARE - Administrator, Civil Service (Medical): No    Lack of  Transportation (Non-Medical): No  Physical Activity: Inactive (07/27/2022)   Exercise Vital Sign    Days of Exercise per Week: 0 days    Minutes of Exercise per Session: 0 min  Stress: No Stress Concern Present (07/27/2022)   [employer] of Occupational Health - Occupational Stress Questionnaire    Feeling of Stress : Not at all  Social Connections: Unknown (07/27/2022)   Social Connection and Isolation Panel [NHANES]    Frequency of Communication with Friends and Family: Not on file    Frequency of Social Gatherings with Friends and Family: Not on file    Attends Religious Services: Not on file    Active Member of Clubs or Organizations: Not on file    Attends Banker Meetings: Not on file    Marital Status: Married    Tobacco Counseling Ready to quit: Not Answered Counseling given: Not Answered Tobacco comments: Started smoking age 54   Clinical Intake:  Pre-visit preparation completed: Yes        Diabetes: No  How often do you need to have someone help you when you read instructions, pamphlets, or other written materials from your doctor or pharmacy?: 2 - Rarely    Interpreter Needed?: No      Activities of Daily Living    07/27/2022    9:01 AM 02/09/2022    9:09 PM  In your present state of health, do you have any difficulty performing the following activities:  Hearing? 0   Vision? 0   Difficulty concentrating or making decisions? 0   Walking or climbing stairs? 1   Comment Cane in use   Dressing or bathing? 1   Comment Wife assist as needed   Doing errands, shopping? 0 0  Preparing Food and eating ? Y   Using the Toilet? N   In the past six months, have you accidently leaked urine? N   Do you have problems with loss of bowel control? N   Managing your Medications? N   Managing your Finances? N   Housekeeping or managing your Housekeeping? Y   Comment Wife assist     Patient Care Team: Dana Allan, MD as PCP - General (Family  Medicine)  Indicate any recent Medical Services you may have received from other than [facility] providers in the past year (date may be approximate).     Assessment:   This is a routine wellness examination for Martin Hernandez.  I connected with  Martin Hernandez. on 07/27/22 by a audio enabled telemedicine application and verified that I am speaking with the correct person using two identifiers.  Patient Location: Home  Provider Location: Office/Clinic  I discussed the limitations of evaluation and management by telemedicine. The patient expressed understanding and agreed to proceed.   Hearing/Vision screen Hearing Screening - Comments:: Patient is able to hear conversational tones without difficulty.  No issues reported. Vision Screening - Comments:: Comments: Wears reading glasses as needed    Dietary issues and exercise activities discussed: Current Exercise Habits: Home exercise routine, Intensity: Mild Regular diet Good fluid intake   Goals Addressed             This Visit's Progress    Follow up with Primary Care Provider       As needed. Complete Advanced Directive       Depression Screen    07/27/2022    9:07 AM 12/08/2021    9:10 AM 07/21/2021    8:53 AM 05/24/2021    9:54 AM 04/22/2020  9:42 AM 10/17/2019   10:37 AM 06/27/2019    8:13 AM  PHQ 2/9 Scores  PHQ - 2 Score 0 1 0 0 0 0 0  PHQ- 9 Score      0     Fall Risk    07/27/2022    9:05 AM 03/16/2022    3:12 PM 12/08/2021    9:10 AM 07/21/2021    8:53 AM 05/24/2021    9:59 AM  Fall Risk   Falls in the past year? 1 0 1 0 0  Comment None since last reported to pcp      Number falls in past yr: 0 0 1 0 0  Injury with Fall? 0 0 0 0   Risk for fall due to :  No Fall Risks Other (Comment) No Fall Risks   Follow up Falls evaluation completed;Falls prevention discussed Falls evaluation completed Falls evaluation completed Falls evaluation completed Falls evaluation completed    FALL RISK PREVENTION  PERTAINING TO THE HOME: Home free of loose throw rugs in walkways, pet beds, electrical cords, etc? Yes  Adequate lighting in your home to reduce risk of falls? Yes   ASSISTIVE DEVICES UTILIZED TO PREVENT FALLS: Life alert? No  Use of a cane, walker or w/c? Yes  Grab bars in the bathroom? No  Shower chair or bench in shower? Yes  Elevated toilet seat or a handicapped toilet? Yes   TIMED UP AND GO: Was the test performed? No .   Cognitive Function:    11/03/2015   10:32 AM  MMSE - Mini Mental State Exam  Orientation to time 5  Orientation to Place 5  Registration 3  Attention/ Calculation 5  Recall 3  Language- name 2 objects 2  Language- repeat 1  Language- follow 3 step command 3  Language- read & follow direction 1  Write a sentence 1  Copy design 1  Total score 30        07/27/2022    9:15 AM 05/24/2021   10:19 AM 04/22/2020    9:55 AM 04/22/2019    9:47 AM 11/05/2017   10:30 AM  6CIT Screen  What Year? 0 points 0 points 0 points 0 points 0 points  What month? 0 points 0 points 0 points 0 points 0 points  What time? 0 points 0 points 0 points 0 points 0 points  Count back from 20 0 points 0 points  0 points 0 points  Months in reverse 0 points  0 points 4 points 2 points  Repeat phrase 0 points  0 points  0 points  Total Score 0 points    2 points    Immunizations Immunization History  Administered Date(s) Administered   Covid-19, Mrna,Vaccine(Spikevax)5yrs and older 02/23/2022   Hepatitis B, ADULT 03/08/2017, 05/22/2017, 05/30/2018   Influenza-Unspecified 04/19/2012   Moderna Covid-19 Vaccine Bivalent Booster 15yrs & up 03/22/2020   Moderna SARS-COV2 Booster Vaccination 03/22/2020   Moderna Sars-Covid-2 Vaccination 06/18/2019, 07/19/2019   PNEUMOCOCCAL CONJUGATE-20 02/23/2022   TDAP status: Due, Education has been provided regarding the importance of this vaccine. Advised may receive this vaccine at local pharmacy or Health Dept. Aware to provide a copy of  the vaccination record if obtained from local pharmacy or Health Dept. Verbalized acceptance and understanding.  Screening Tests Health Maintenance  Topic Date Due   DTaP/Tdap/Td (1 - Tdap) Never done   COVID-19 Vaccine (4 - 2023-24 season) 08/12/2022 (Originally 04/20/2022)   INFLUENZA VACCINE  11/09/2022   Medicare  Annual Wellness (AWV)  07/27/2023   COLONOSCOPY (Pts 45-31yrs Insurance coverage will need to be confirmed)  02/13/2032   Hepatitis C Screening  Completed   HPV VACCINES  Aged Out   Pneumonia Vaccine 53+ Years old  Discontinued   Zoster Vaccines- Shingrix  Discontinued   Health Maintenance Health Maintenance Due  Topic Date Due   DTaP/Tdap/Td (1 - Tdap) Never done   Hepatitis C Screening: Completed 02/2017.  Vision Screening: Recommended annual ophthalmology exams for early detection of glaucoma and other disorders of the eye.  Dental Screening: Recommended annual dental exams for proper oral hygiene  Community Resource Referral / Chronic Care Management: CRR required this visit?  No   CCM required this visit?  No      Plan:     I have personally reviewed and noted the following in the patient's chart:   Medical and social history Use of alcohol, tobacco or illicit drugs  Current medications and supplements including opioid prescriptions. Patient is currently taking opioid prescriptions. Information provided to patient regarding non-opioid alternatives. Patient advised to discuss non-opioid treatment plan with their provider.Followed by [facility].  Functional ability and status Nutritional status Physical activity Advanced directives List of other physicians Hospitalizations, surgeries, and ER visits in previous 12 months Vitals Screenings to include cognitive, depression, and falls Referrals and appointments  In addition, I have reviewed and discussed with patient certain preventive protocols, quality metrics, and best practice  recommendations. A written personalized care plan for preventive services as well as general preventive health recommendations were provided to patient.     Cathey Endow, LPN   12/07/5619

## 2022-07-28 ENCOUNTER — Telehealth: Payer: Self-pay | Admitting: Family Medicine

## 2022-07-28 NOTE — Telephone Encounter (Signed)
New message     1. Which medications need to be refilled? (please list name of each medication and dose if known) zolpidem (AMBIEN) 10 MG tablet   2. Which pharmacy/location (including street and city if local pharmacy) is medication to be sent to?[facility] [place]    3. Do they need a 30 day or 90 day supply? 30 day supply

## 2022-07-28 NOTE — Telephone Encounter (Signed)
Called [place] [facility] [place] with an Cicero Duck who informed me that the medication had shipped last night and I would be unable to cancel this medication as the pt [facility] [place] [facility], Cicero Duck told me that it would be to the pt by tomorrow.    Called pt and informed him.

## 2022-07-29 ENCOUNTER — Other Ambulatory Visit: Payer: Self-pay | Admitting: Family Medicine

## 2022-07-29 DIAGNOSIS — G4700 Insomnia, unspecified: Secondary | ICD-10-CM

## 2022-08-02 DIAGNOSIS — K219 Gastro-esophageal reflux disease without esophagitis: Secondary | ICD-10-CM | POA: Diagnosis not present

## 2022-08-02 DIAGNOSIS — Z7982 Long term (current) use of aspirin: Secondary | ICD-10-CM | POA: Diagnosis not present

## 2022-08-02 DIAGNOSIS — I739 Peripheral vascular disease, unspecified: Secondary | ICD-10-CM | POA: Diagnosis not present

## 2022-08-02 DIAGNOSIS — Z7901 Long term (current) use of anticoagulants: Secondary | ICD-10-CM | POA: Diagnosis not present

## 2022-08-02 DIAGNOSIS — I723 Aneurysm of iliac artery: Secondary | ICD-10-CM | POA: Diagnosis not present

## 2022-08-02 DIAGNOSIS — T82868A Thrombosis of vascular prosthetic devices, implants and grafts, initial encounter: Secondary | ICD-10-CM | POA: Diagnosis not present

## 2022-08-02 DIAGNOSIS — I743 Embolism and thrombosis of arteries of the lower extremities: Secondary | ICD-10-CM | POA: Diagnosis not present

## 2022-08-02 DIAGNOSIS — E785 Hyperlipidemia, unspecified: Secondary | ICD-10-CM | POA: Diagnosis not present

## 2022-08-02 DIAGNOSIS — I82421 Acute embolism and thrombosis of right iliac vein: Secondary | ICD-10-CM | POA: Diagnosis not present

## 2022-08-02 DIAGNOSIS — T82856A Stenosis of peripheral vascular stent, initial encounter: Secondary | ICD-10-CM | POA: Diagnosis not present

## 2022-08-02 DIAGNOSIS — Z95810 Presence of automatic (implantable) cardiac defibrillator: Secondary | ICD-10-CM | POA: Diagnosis not present

## 2022-08-02 DIAGNOSIS — I779 Disorder of arteries and arterioles, unspecified: Secondary | ICD-10-CM | POA: Diagnosis not present

## 2022-08-02 DIAGNOSIS — I745 Embolism and thrombosis of iliac artery: Secondary | ICD-10-CM | POA: Diagnosis not present

## 2022-08-02 DIAGNOSIS — I5022 Chronic systolic (congestive) heart failure: Secondary | ICD-10-CM | POA: Diagnosis not present

## 2022-08-02 DIAGNOSIS — R3121 Asymptomatic microscopic hematuria: Secondary | ICD-10-CM | POA: Diagnosis not present

## 2022-08-02 DIAGNOSIS — I110 Hypertensive heart disease with heart failure: Secondary | ICD-10-CM | POA: Diagnosis not present

## 2022-08-02 DIAGNOSIS — Z95820 Peripheral vascular angioplasty status with implants and grafts: Secondary | ICD-10-CM | POA: Diagnosis not present

## 2022-08-02 DIAGNOSIS — I2510 Atherosclerotic heart disease of native coronary artery without angina pectoris: Secondary | ICD-10-CM | POA: Diagnosis not present

## 2022-08-03 ENCOUNTER — Ambulatory Visit (INDEPENDENT_AMBULATORY_CARE_PROVIDER_SITE_OTHER): Payer: Medicare Other | Admitting: Nurse Practitioner

## 2022-08-03 ENCOUNTER — Encounter: Payer: Self-pay | Admitting: Nurse Practitioner

## 2022-08-03 VITALS — BP 118/80 | HR 87 | Temp 98.0°F | Ht 69.0 in | Wt 117.6 lb

## 2022-08-03 DIAGNOSIS — I739 Peripheral vascular disease, unspecified: Secondary | ICD-10-CM | POA: Diagnosis not present

## 2022-08-03 DIAGNOSIS — R3121 Asymptomatic microscopic hematuria: Secondary | ICD-10-CM | POA: Diagnosis not present

## 2022-08-03 LAB — URINALYSIS, ROUTINE W REFLEX MICROSCOPIC
Bilirubin Urine: NEGATIVE
Ketones, ur: NEGATIVE
Leukocytes,Ua: NEGATIVE
Nitrite: NEGATIVE
Specific Gravity, Urine: >=1.03 — AB (ref 1.000–1.030)
Urine Glucose: >=1000 — AB
Urobilinogen, UA: 0.2 (ref 0.0–1.0)
pH: 6 (ref 5.0–8.0)

## 2022-08-03 LAB — POC URINALSYSI DIPSTICK (AUTOMATED)
Bilirubin, UA: NEGATIVE
Glucose, UA: POSITIVE — AB
Ketones, UA: NEGATIVE
Leukocytes, UA: NEGATIVE
Nitrite, UA: NEGATIVE
Protein, UA: POSITIVE — AB
Spec Grav, UA: 1.025 (ref 1.010–1.025)
Urobilinogen, UA: 0.2 E.U./dL
pH, UA: 5.5 (ref 5.0–8.0)

## 2022-08-03 NOTE — Progress Notes (Addendum)
Martin Dicker, NP-C Phone: [phone]  Martin Hernandez. is a 67 y.o. male who presents today for hematuria.  Patient is scheduled for a bilateral lower extremity angiogram procedure tomorrow. He had pre-op testing yesterday, including a urinalysis which revealed large amounts of blood in his urine.   Hematuria: Patient complains of microscopic hematuria. Onset of hematuria was  unknown.  There is not a history of nephrolithiasis. There is not a history of urologic trauma. Other urologic symptoms include none. Patient admits to history of  recent foley catheter placement for surgical procedure in March . Patient denies history of GU surgeries, sexually transmitted diseases, trauma, and urolithiasis. Prior workup has been UA'S.  UTI: Dysuria- No Frequency- No  Urgency- No  Hematuria- Yes  Fever- No Abd pain- No  Penile d/c- No  Social History   Tobacco Use  Smoking Status Some Days   Packs/day: 0.25   Years: 50.00   Additional pack years: 0.00   Total pack years: 12.50   Types: Cigarettes   Passive exposure: Current  Smokeless Tobacco Never  Tobacco Comments   Started smoking age 29    Current Outpatient Medications on File Prior to Visit  Medication Sig Dispense Refill   alfuzosin (UROXATRAL) 10 MG 24 hr tablet Take 1 tablet (10 mg total) by mouth daily with breakfast. Dc flomax 0.4 90 tablet 3   aspirin 81 MG tablet Take 81 mg by mouth daily.     atorvastatin (LIPITOR) 80 MG tablet Take 1 tablet (80 mg total) by mouth daily at 6pm. 90 tablet 3   Baclofen 5 MG TABS Take 1 tablet (5 mg total) by mouth 2 (two) times daily. 180 tablet 3   empagliflozin (JARDIANCE) 10 MG TABS tablet Take 10 mg by mouth daily.     furosemide (LASIX) 20 MG tablet Take 20 mg by mouth daily.     gabapentin (NEURONTIN) 300 MG capsule Take 300 mg by mouth 3 (three) times daily.     ipratropium (ATROVENT) 0.02 % nebulizer solution Inhale into the lungs.     isosorbide mononitrate (IMDUR) 60 MG  24 hr tablet Take 1 tablet by mouth in the morning, at noon, and at bedtime. Take 1 tablet by mouth in the morning at noon and 2 tablets at 7 PM     lisinopril (ZESTRIL) 2.5 MG tablet Take 2.5 mg by mouth daily.     metoprolol succinate (TOPROL-XL) 25 MG 24 hr tablet Take 1 tablet (25 mg total) by mouth daily. 90 tablet 3   mirtazapine (REMERON) 30 MG tablet Take 1 tablet (30 mg total) by mouth at bedtime. 90 tablet 0   nitroGLYCERIN (NITROSTAT) 0.4 MG SL tablet Place 1 tablet (0.4 mg total) under the tongue every 5 (five) minutes as needed for chest pain. 90 tablet 3   oxyCODONE-acetaminophen (PERCOCET) 10-325 MG tablet Take 1 tablet by mouth as needed.     pantoprazole (PROTONIX) 40 MG tablet Take 1 tablet by mouth daily 30 minutes before food. 90 tablet 3   pyridOXINE (VITAMIN B6) 100 MG tablet Take 1 tablet (100 mg total) by mouth daily. 90 tablet 3   thiamine (VITAMIN B-1) 100 MG tablet Take 100 mg by mouth daily.     zolpidem (AMBIEN) 10 MG tablet Take 1 tablet (10 mg total) by mouth at bedtime as needed. 30 tablet 0   No current facility-administered medications on file prior to visit.    ROS see history of present illness  Objective  Physical  Exam Vitals:   08/03/22 0823  BP: 118/80  Pulse: 87  Temp: 98 F (36.7 C)  SpO2: 96%    BP Readings from Last 3 Encounters:  08/03/22 118/80  03/16/22 100/60  02/12/22 138/87   Wt Readings from Last 3 Encounters:  08/03/22 117 lb 9.6 oz (53.3 kg)  07/27/22 109 lb (49.4 kg)  03/16/22 119 lb (54 kg)    Physical Exam Constitutional:      General: He is not in acute distress.    Appearance: Normal appearance.  HENT:     Head: Normocephalic.  Cardiovascular:     Rate and Rhythm: Normal rate and regular rhythm.     Heart sounds: Normal heart sounds.  Pulmonary:     Effort: Pulmonary effort is normal.     Breath sounds: Normal breath sounds.  Skin:    General: Skin is warm and dry.  Neurological:     General: No focal  deficit present.     Mental Status: He is alert.  Psychiatric:        Mood and Affect: Mood normal.        Behavior: Behavior normal.    Assessment/Plan: Please see individual problem list.  Asymptomatic microscopic hematuria Assessment & Plan: UA in office with moderate blood and protein, large glucose due to taking Jardiance. Microscopic and culture pending. Denies any other urinary symptoms, denies dysuria, denies frequency, denies fever/chills. Patient declined referral to Urology until culture results are back. Advised patient we will contact him with results.  Orders: -     POCT Urinalysis Dipstick (Automated) -     Urinalysis, Routine w reflex microscopic -     Urine Culture  PAD (peripheral artery disease) Assessment & Plan: Surgery tomorrow for bilateral lower extremity angiogram.     Return if symptoms worsen or fail to improve.   Martin Dicker, NP-C [facility] [employer]

## 2022-08-03 NOTE — Assessment & Plan Note (Signed)
Surgery tomorrow for bilateral lower extremity angiogram.

## 2022-08-03 NOTE — Assessment & Plan Note (Addendum)
UA in office with moderate blood and protein, large glucose due to taking Jardiance. Microscopic and culture pending. Denies any other urinary symptoms, denies dysuria, denies frequency, denies fever/chills. Patient declined referral to Urology until culture results are back. Advised patient we will contact him with results.

## 2022-08-04 DIAGNOSIS — I745 Embolism and thrombosis of iliac artery: Secondary | ICD-10-CM | POA: Diagnosis not present

## 2022-08-04 DIAGNOSIS — I743 Embolism and thrombosis of arteries of the lower extremities: Secondary | ICD-10-CM | POA: Diagnosis not present

## 2022-08-04 DIAGNOSIS — T82856A Stenosis of peripheral vascular stent, initial encounter: Secondary | ICD-10-CM | POA: Diagnosis not present

## 2022-08-04 LAB — URINE CULTURE
MICRO NUMBER:: 14873994
Result:: NO GROWTH
SPECIMEN QUALITY:: ADEQUATE

## 2022-08-07 ENCOUNTER — Other Ambulatory Visit: Payer: Self-pay | Admitting: Nurse Practitioner

## 2022-08-07 ENCOUNTER — Telehealth: Payer: Self-pay | Admitting: *Deleted

## 2022-08-07 DIAGNOSIS — R3121 Asymptomatic microscopic hematuria: Secondary | ICD-10-CM

## 2022-08-07 NOTE — Transitions of Care (Post Inpatient/ED Visit) (Signed)
   08/07/2022  Name: Martin Hernandez [facility]. MRN: [id number] DOB: 12-19-1955  Today's TOC FU Call Status: Today's TOC FU Call Status:: Successful TOC FU Call Competed TOC FU Call Complete Date: 08/07/22  Transition Care Management Follow-up Telephone Call Date of Discharge: 08/05/22 Discharge Facility: Other [facility]) Name of Other (Non[facility]) Discharge Facility: [facility] Type of Discharge: Inpatient Admission Primary Inpatient Discharge Diagnosis:: Peripheral Artery Disease How have you been since you were released from the hospital?: Better Any questions or concerns?: Yes Patient Questions/Concerns:: Patient has swelling in lower extremity. Rn discussed keeping legs elevated when the patient is sitting. Patient had not been doing this. Patient Questions/Concerns Addressed: Notified Provider of Patient Questions/Concerns, Other: (Patient has put in call to Surgeon)  Items Reviewed: Did you receive and understand the discharge instructions provided?: Yes Medications obtained and verified?: Yes (Medications Reviewed) Any new allergies since your discharge?: No Dietary orders reviewed?: No Do you have support at home?: Yes People in Home: spouse Name of Support/Comfort Primary Source: [facility] and Equipment/Supplies: Were Home Health Services Ordered?: No Any new equipment or medical supplies ordered?: No  Functional Questionnaire: Do you need assistance with bathing/showering or dressing?: Yes Do you need assistance with meal preparation?: Yes Do you need assistance with eating?: No Do you have difficulty maintaining continence: No Do you need assistance with getting out of bed/getting out of a chair/moving?: No Do you have difficulty managing or taking your medications?: No  Follow up appointments reviewed: PCP Follow-up appointment confirmed?: NA Specialist Hospital Follow-up appointment confirmed?: Yes Date of Specialist follow-up appointment?:  08/16/22 Follow-Up Specialty Provider:: Dr Lucretia Roers 1:45 Do you need transportation to your follow-up appointment?: No Do you understand care options if your condition(s) worsen?: Yes-patient verbalized understanding  SDOH Interventions Today    Flowsheet Row Most Recent Value  SDOH Interventions   Food Insecurity Interventions Intervention Not Indicated  Housing Interventions Intervention Not Indicated  Transportation Interventions Intervention Not Indicated      Interventions Today    Flowsheet Row Most Recent Value  General Interventions   General Interventions Discussed/Reviewed General Interventions Discussed, General Interventions Reviewed, Doctor Visits      TOC Interventions Today    Flowsheet Row Most Recent Value  TOC Interventions   TOC Interventions Discussed/Reviewed TOC Interventions Discussed, Contacted EMS/911 for emergency patient needs  [RN discussed wound care. RN discussed elevating lowe extremities while sitting. Patient stated his ankles are swollen. Patient has put a call into surgeon.]        Gean Maidens BSN RN [facility] [phone]

## 2022-08-16 ENCOUNTER — Telehealth: Payer: Self-pay | Admitting: Family Medicine

## 2022-08-16 DIAGNOSIS — I779 Disorder of arteries and arterioles, unspecified: Secondary | ICD-10-CM | POA: Diagnosis not present

## 2022-08-16 DIAGNOSIS — Z09 Encounter for follow-up examination after completed treatment for conditions other than malignant neoplasm: Secondary | ICD-10-CM | POA: Diagnosis not present

## 2022-08-16 NOTE — Telephone Encounter (Signed)
Prescription Request  08/16/2022  LOV: 03/16/2022  What is the name of the medication or equipment? furosemide (LASIX) 20 MG tablet  Have you contacted your pharmacy to request a refill? Yes   Which pharmacy would you like this sent to?   [id number] [place], [place] - [address] [place] [address] Phone: [phone] Fax: [phone]    Patient notified that their request is being sent to the clinical staff for review and that they should receive a response within 2 business days.   Please advise at Mobile [phone] (mobile)

## 2022-08-17 NOTE — Telephone Encounter (Signed)
Per chart this medication has not been prescribed since 2021, and this shows as historical with no further documentation.  [facility] show patient was prescribed this 08/15/21 by Dr. Misty Stanley Povsic, dispensed #90, with 3 refills available but never filled.  LVM for pt to return call.  Need to clarify if he is still taking this rx and who has been prescribing it for him.

## 2022-08-17 NOTE — Telephone Encounter (Signed)
Pt returned [facility] CMA call. Note below was read to pt. As per pt, nobody has been prescribing this to him. But he will call Dr. Misty Stanley Povsic office to see if he can get it from her. If not, pt will call back.

## 2022-08-17 NOTE — Telephone Encounter (Signed)
Noted  

## 2022-08-23 ENCOUNTER — Encounter: Payer: Self-pay | Admitting: Nurse Practitioner

## 2022-08-23 DIAGNOSIS — Z79891 Long term (current) use of opiate analgesic: Secondary | ICD-10-CM | POA: Diagnosis not present

## 2022-08-23 DIAGNOSIS — M25511 Pain in right shoulder: Secondary | ICD-10-CM | POA: Diagnosis not present

## 2022-08-23 DIAGNOSIS — G8929 Other chronic pain: Secondary | ICD-10-CM | POA: Diagnosis not present

## 2022-08-25 ENCOUNTER — Telehealth: Payer: Self-pay | Admitting: Family Medicine

## 2022-08-25 NOTE — Telephone Encounter (Signed)
Pt need a refill on ambien sent to pill pack

## 2022-08-27 ENCOUNTER — Other Ambulatory Visit: Payer: Self-pay | Admitting: Family Medicine

## 2022-08-27 DIAGNOSIS — G4700 Insomnia, unspecified: Secondary | ICD-10-CM

## 2022-08-27 MED ORDER — ZOLPIDEM TARTRATE 10 MG PO TABS: 10.0000 mg | ORAL_TABLET | Freq: Every evening | ORAL | 0 refills | Status: DC | PRN

## 2022-08-31 ENCOUNTER — Other Ambulatory Visit: Payer: Self-pay

## 2022-08-31 MED ORDER — ALFUZOSIN HCL ER 10 MG PO TB24: 10.0000 mg | ORAL_TABLET | Freq: Every day | ORAL | 3 refills | Status: AC

## 2022-09-07 ENCOUNTER — Ambulatory Visit
Admission: RE | Admit: 2022-09-07 | Discharge: 2022-09-07 | Disposition: A | Discharge status: Home / Self Care | Payer: Medicare Other | Source: Ambulatory Visit | Attending: Nurse Practitioner | Admitting: Nurse Practitioner

## 2022-09-07 ENCOUNTER — Other Ambulatory Visit: Payer: Self-pay | Admitting: Nurse Practitioner

## 2022-09-07 DIAGNOSIS — M25511 Pain in right shoulder: Secondary | ICD-10-CM

## 2022-09-07 DIAGNOSIS — M25512 Pain in left shoulder: Secondary | ICD-10-CM | POA: Diagnosis not present

## 2022-09-21 DIAGNOSIS — Z79891 Long term (current) use of opiate analgesic: Secondary | ICD-10-CM | POA: Diagnosis not present

## 2022-09-21 DIAGNOSIS — M25511 Pain in right shoulder: Secondary | ICD-10-CM | POA: Diagnosis not present

## 2022-09-21 DIAGNOSIS — G8929 Other chronic pain: Secondary | ICD-10-CM | POA: Diagnosis not present

## 2022-09-26 ENCOUNTER — Other Ambulatory Visit: Payer: Self-pay | Admitting: Family Medicine

## 2022-09-26 DIAGNOSIS — G4700 Insomnia, unspecified: Secondary | ICD-10-CM

## 2022-09-28 ENCOUNTER — Telehealth: Payer: Self-pay

## 2022-09-28 NOTE — Telephone Encounter (Signed)
Spoke with pt to let him know that the medication was refilled two days and that he just needs to call the pharmacy. Pt gave a verbal understanding.

## 2022-09-28 NOTE — Telephone Encounter (Signed)
Prescription Request  09/28/2022  LOV: Visit date not found  What is the name of the medication or equipment?  zolpidem (AMBIEN) 10 MG tablet   Have you contacted your pharmacy to request a refill? Yes   Which pharmacy would you like this sent to?   [id number] [place], [place] - [address] [place] [address] Phone: [phone] Fax: [phone]    Patient notified that their request is being sent to the clinical staff for review and that they should receive a response within 2 business days.   Please advise at [facility] [phone]  Patient states he has three pills left.  Patient states he does not want to run out.

## 2022-09-30 ENCOUNTER — Other Ambulatory Visit: Payer: Self-pay | Admitting: Family

## 2022-10-10 DIAGNOSIS — I255 Ischemic cardiomyopathy: Secondary | ICD-10-CM | POA: Diagnosis not present

## 2022-10-10 DIAGNOSIS — Z4502 Encounter for adjustment and management of automatic implantable cardiac defibrillator: Secondary | ICD-10-CM | POA: Diagnosis not present

## 2022-10-10 DIAGNOSIS — I2510 Atherosclerotic heart disease of native coronary artery without angina pectoris: Secondary | ICD-10-CM | POA: Diagnosis not present

## 2022-10-10 DIAGNOSIS — I5022 Chronic systolic (congestive) heart failure: Secondary | ICD-10-CM | POA: Diagnosis not present

## 2022-10-10 DIAGNOSIS — Z95810 Presence of automatic (implantable) cardiac defibrillator: Secondary | ICD-10-CM | POA: Diagnosis not present

## 2022-10-10 DIAGNOSIS — I10 Essential (primary) hypertension: Secondary | ICD-10-CM | POA: Diagnosis not present

## 2022-10-10 DIAGNOSIS — I4729 Other ventricular tachycardia: Secondary | ICD-10-CM | POA: Diagnosis not present

## 2022-10-11 DIAGNOSIS — I714 Abdominal aortic aneurysm, without rupture, unspecified: Secondary | ICD-10-CM | POA: Diagnosis not present

## 2022-10-11 DIAGNOSIS — Z8679 Personal history of other diseases of the circulatory system: Secondary | ICD-10-CM | POA: Diagnosis not present

## 2022-10-11 DIAGNOSIS — Z95820 Peripheral vascular angioplasty status with implants and grafts: Secondary | ICD-10-CM | POA: Diagnosis not present

## 2022-10-11 DIAGNOSIS — F17210 Nicotine dependence, cigarettes, uncomplicated: Secondary | ICD-10-CM | POA: Diagnosis not present

## 2022-10-11 DIAGNOSIS — Z955 Presence of coronary angioplasty implant and graft: Secondary | ICD-10-CM | POA: Diagnosis not present

## 2022-10-11 DIAGNOSIS — Z09 Encounter for follow-up examination after completed treatment for conditions other than malignant neoplasm: Secondary | ICD-10-CM | POA: Diagnosis not present

## 2022-10-11 DIAGNOSIS — E785 Hyperlipidemia, unspecified: Secondary | ICD-10-CM | POA: Diagnosis not present

## 2022-10-11 DIAGNOSIS — M25552 Pain in left hip: Secondary | ICD-10-CM | POA: Diagnosis not present

## 2022-10-11 DIAGNOSIS — I2510 Atherosclerotic heart disease of native coronary artery without angina pectoris: Secondary | ICD-10-CM | POA: Diagnosis not present

## 2022-10-11 DIAGNOSIS — I779 Disorder of arteries and arterioles, unspecified: Secondary | ICD-10-CM | POA: Diagnosis not present

## 2022-10-11 DIAGNOSIS — M25551 Pain in right hip: Secondary | ICD-10-CM | POA: Diagnosis not present

## 2022-10-11 DIAGNOSIS — I10 Essential (primary) hypertension: Secondary | ICD-10-CM | POA: Diagnosis not present

## 2022-10-13 ENCOUNTER — Other Ambulatory Visit: Payer: Self-pay

## 2022-10-13 MED ORDER — MIRTAZAPINE 30 MG PO TABS: 30.0000 mg | ORAL_TABLET | Freq: Every day | ORAL | 0 refills | Status: DC

## 2022-10-16 DIAGNOSIS — I255 Ischemic cardiomyopathy: Secondary | ICD-10-CM | POA: Diagnosis not present

## 2022-10-16 DIAGNOSIS — R3129 Other microscopic hematuria: Secondary | ICD-10-CM | POA: Diagnosis not present

## 2022-10-18 DIAGNOSIS — Z79891 Long term (current) use of opiate analgesic: Secondary | ICD-10-CM | POA: Diagnosis not present

## 2022-10-18 DIAGNOSIS — M25511 Pain in right shoulder: Secondary | ICD-10-CM | POA: Diagnosis not present

## 2022-10-18 DIAGNOSIS — G8929 Other chronic pain: Secondary | ICD-10-CM | POA: Diagnosis not present

## 2022-10-30 ENCOUNTER — Other Ambulatory Visit: Payer: Self-pay | Admitting: Family Medicine

## 2022-10-30 ENCOUNTER — Telehealth: Payer: Self-pay | Admitting: Family Medicine

## 2022-10-30 DIAGNOSIS — G4700 Insomnia, unspecified: Secondary | ICD-10-CM

## 2022-10-30 MED ORDER — ZOLPIDEM TARTRATE 10 MG PO TABS
10.0000 mg | ORAL_TABLET | Freq: Every evening | ORAL | 0 refills | Status: DC | PRN
Start: 2022-10-30 — End: 2022-11-30

## 2022-10-30 NOTE — Telephone Encounter (Signed)
Pt called in stating that he needs blood work done. Please advice.

## 2022-10-30 NOTE — Telephone Encounter (Signed)
Prescription Request  10/30/2022  LOV: Visit date not found  What is the name of the medication or equipment? zolpidem (AMBIEN) 10 MG tablet  Have you contacted your pharmacy to request a refill? Yes   Which pharmacy would you like this sent to?   [id number] [place], [place] - [address] [place] [address] Phone: [phone] Fax: [phone]    Patient notified that their request is being sent to the clinical staff for review and that they should receive a response within 2 business days.   Please advise at Mobile [phone] (mobile)

## 2022-10-31 NOTE — Telephone Encounter (Signed)
Appt schedule for 11/01/22 at 8am

## 2022-11-02 ENCOUNTER — Ambulatory Visit (INDEPENDENT_AMBULATORY_CARE_PROVIDER_SITE_OTHER): Payer: Medicare Other | Admitting: Family Medicine

## 2022-11-02 ENCOUNTER — Other Ambulatory Visit: Payer: Self-pay | Admitting: Family Medicine

## 2022-11-02 ENCOUNTER — Encounter: Payer: Self-pay | Admitting: Family Medicine

## 2022-11-02 VITALS — BP 122/78 | HR 73 | Temp 98.2°F | Wt 117.0 lb

## 2022-11-02 DIAGNOSIS — R319 Hematuria, unspecified: Secondary | ICD-10-CM

## 2022-11-02 DIAGNOSIS — D509 Iron deficiency anemia, unspecified: Secondary | ICD-10-CM

## 2022-11-02 DIAGNOSIS — R5383 Other fatigue: Secondary | ICD-10-CM

## 2022-11-02 DIAGNOSIS — E559 Vitamin D deficiency, unspecified: Secondary | ICD-10-CM

## 2022-11-02 LAB — COMPREHENSIVE METABOLIC PANEL
ALT: 20 U/L (ref 0–53)
AST: 32 U/L (ref 0–37)
Albumin: 4 g/dL (ref 3.5–5.2)
Alkaline Phosphatase: 53 U/L (ref 39–117)
BUN: 15 mg/dL (ref 6–23)
CO2: 30 mEq/L (ref 19–32)
Calcium: 9.1 mg/dL (ref 8.4–10.5)
Chloride: 101 mEq/L (ref 96–112)
Creatinine, Ser: 1.03 mg/dL (ref 0.40–1.50)
GFR: 75 mL/min (ref >60.00)
Glucose, Bld: 179 mg/dL — ABNORMAL HIGH (ref 70–99)
Potassium: 4.3 mEq/L (ref 3.5–5.1)
Sodium: 138 mEq/L (ref 135–145)
Total Bilirubin: 0.2 mg/dL (ref 0.2–1.2)
Total Protein: 6.9 g/dL (ref 6.0–8.3)

## 2022-11-02 LAB — URINALYSIS, ROUTINE W REFLEX MICROSCOPIC
Bilirubin Urine: NEGATIVE
Ketones, ur: NEGATIVE
Leukocytes,Ua: NEGATIVE
Nitrite: NEGATIVE
Specific Gravity, Urine: 1.02 (ref 1.000–1.030)
Total Protein, Urine: NEGATIVE
Urine Glucose: NEGATIVE
Urobilinogen, UA: 0.2 (ref 0.0–1.0)
pH: 6 (ref 5.0–8.0)

## 2022-11-02 LAB — IBC + FERRITIN
Ferritin: 10.2 ng/mL — ABNORMAL LOW (ref 22.0–322.0)
Iron: 28 ug/dL — ABNORMAL LOW (ref 42–165)
Saturation Ratios: 7.1 % — ABNORMAL LOW (ref 20.0–50.0)
TIBC: 394.8 ug/dL (ref 250.0–450.0)
Transferrin: 282 mg/dL (ref 212.0–360.0)

## 2022-11-02 LAB — CBC
HCT: 38.1 % — ABNORMAL LOW (ref 39.0–52.0)
Hemoglobin: 11.6 g/dL — ABNORMAL LOW (ref 13.0–17.0)
MCHC: 30.5 g/dL (ref 30.0–36.0)
MCV: 88.7 fl (ref 78.0–100.0)
Platelets: 259 10*3/uL (ref 150.0–400.0)
RBC: 4.29 Mil/uL (ref 4.22–5.81)
RDW: 21 % — ABNORMAL HIGH (ref 11.5–15.5)
WBC: 4 10*3/uL (ref 4.0–10.5)

## 2022-11-02 LAB — VITAMIN D 25 HYDROXY (VIT D DEFICIENCY, FRACTURES): VITD: 23.19 ng/mL — ABNORMAL LOW (ref 30.00–100.00)

## 2022-11-02 LAB — VITAMIN B12: Vitamin B-12: 286 pg/mL (ref 211–911)

## 2022-11-02 LAB — TSH: TSH: 1.44 u[IU]/mL (ref 0.35–5.50)

## 2022-11-02 MED ORDER — IRON (FERROUS SULFATE) 325 (65 FE) MG PO TABS
325.0000 mg | ORAL_TABLET | ORAL | 3 refills | Status: AC
Start: 2022-11-02 — End: ?

## 2022-11-02 MED ORDER — VITAMIN D (ERGOCALCIFEROL) 1.25 MG (50000 UNIT) PO CAPS
50000.0000 [IU] | ORAL_CAPSULE | ORAL | 0 refills | Status: DC
Start: 2022-11-02 — End: 2022-12-26

## 2022-11-02 NOTE — Patient Instructions (Signed)
It was a pleasure meeting you today. Thank you for allowing me to take part in your health care.  Our goals for today as we discussed include:  We will get some labs today.  If they are abnormal or we need to do something about them, I will call you.  If they are normal, I will send you a message on MyChart (if it is active) or a letter in the mail.  If you don't hear from [place] in 2 weeks, please call the office at the number below.   Follow up with Urology at [facility] for cystoscopy and ultrasound  If you have any worsening weakness, passing out, shortness of breath or chest pain call 911 or have someone take you to the Emergency Department   If you have any questions or concerns, please do not hesitate to call the office at [phone].  I look forward to our next visit and until then take care and stay safe.  Regards,   Dana Allan, MD   [facility]

## 2022-11-02 NOTE — Progress Notes (Signed)
SUBJECTIVE:   Chief Complaint  Patient presents with   Fatigue    Fatigue and weakness in legs, blood in urine X 2 months.    HPI Presents to clinic to discuss fatigue and weakness   Reports had similar symptoms in past and was noted to have low blood that required blood transfusion.  Concerned that may need another transfusion today so would like labs and urine checked. He reports having blood in urine. He is followed by [facility] and plan ifs for cystoscopy and ultrasound in near future.  Denies any symptoms today.  Has had dizziness, weakness of lower extremities that has not worsened but not improved.  Denies any chest pain, shortness of breath, heart palpitations.  PERTINENT PMH / PSH: AAA HFrEF CAD ICD Chronic Insomnia HTN Emphysema Chronic Opioid use Chronic Pain Tobacco use    OBJECTIVE:  BP 122/78 (BP Location: Right Arm, Patient Position: Sitting, Cuff Size: Large)   Pulse 73   Temp 98.2 F (36.8 C) (Temporal)   Wt 117 lb (53.1 kg)   SpO2 99%   BMI 17.28 kg/m    Physical Exam Vitals reviewed.  Constitutional:      General: He is not in acute distress.    Appearance: Normal appearance. He is not ill-appearing, toxic-appearing or diaphoretic.  HENT:     Head: Normocephalic.     Mouth/Throat:     Mouth: Mucous membranes are moist.  Eyes:     General:        Right eye: No discharge.        Left eye: No discharge.  Neck:     Thyroid: No thyromegaly or thyroid tenderness.  Cardiovascular:     Rate and Rhythm: Normal rate and regular rhythm.     Pulses: Normal pulses.     Heart sounds: Normal heart sounds. No murmur heard. Pulmonary:     Effort: Pulmonary effort is normal.     Breath sounds: Normal breath sounds. No wheezing or rhonchi.  Musculoskeletal:        General: Normal range of motion.     Cervical back: Normal range of motion.     Right lower leg: Edema (trace) present.     Left lower leg: Edema (trace) present.  Lymphadenopathy:      Cervical: No cervical adenopathy.  Skin:    General: Skin is warm and dry.  Neurological:     Mental Status: He is alert and oriented to person, place, and time. Mental status is at baseline.  Psychiatric:        Mood and Affect: Mood normal.        Behavior: Behavior normal.        Thought Content: Thought content normal.        Judgment: Judgment normal.        11/02/2022    8:11 AM 07/27/2022    9:07 AM 12/08/2021    9:10 AM 07/21/2021    8:53 AM 05/24/2021    9:54 AM  Depression screen PHQ 2/9  Decreased Interest 2 0 1 0 0  Down, Depressed, Hopeless 0 0 0 0 0  PHQ - 2 Score 2 0 1 0 0  Altered sleeping 3      Tired, decreased energy 3      Change in appetite 3      Feeling bad or failure about yourself  0      Trouble concentrating 3      Moving slowly or fidgety/restless 3  Suicidal thoughts 0      PHQ-9 Score 17      Difficult doing work/chores Very difficult          11/02/2022    8:11 AM 10/17/2019   10:38 AM 06/27/2019    8:13 AM  GAD 7 : Generalized Anxiety Score  Nervous, Anxious, on Edge 0 0 0  Control/stop worrying 0 0 0  Worry too much - different things 0 0 0  Trouble relaxing 0 0 0  Restless 0 0 0  Easily annoyed or irritable 1 0 0  Afraid - awful might happen 1 0 0  Total GAD 7 Score 2 0 0  Anxiety Difficulty Very difficult Not difficult at all Not difficult at all    ASSESSMENT/PLAN:  Other fatigue Assessment & Plan: Chronic.  Multifactorial Check CBC, Cmet, TSH, Iron, Ferritin, Vitamin B 12, Vitamin D and U/A Has follow up appointment scheduled with [facility] urology for ongoing hematuria Follows with cardiology, vascular, pain management Follow up with labs.  Orders: -     CBC -     TSH -     VITAMIN D 25 Hydroxy (Vit-D Deficiency, Fractures) -     Vitamin B12 -     Comprehensive metabolic panel -     IBC + Ferritin  Hematuria, unspecified type -     Urinalysis, Routine w reflex microscopic   PDMP reviewed  Return if symptoms worsen or  fail to improve, for PCP.  Dana Allan, MD

## 2022-11-03 ENCOUNTER — Telehealth: Payer: Self-pay | Admitting: Family Medicine

## 2022-11-03 NOTE — Telephone Encounter (Signed)
Pt called stating he would like to know his lab results

## 2022-11-06 NOTE — Telephone Encounter (Signed)
Patient states he recently had labs and urine test and would like to know his results.  Patient states he does not know how to use MyChart.

## 2022-11-07 NOTE — Telephone Encounter (Signed)
Left message to return call to our office.  

## 2022-11-07 NOTE — Telephone Encounter (Signed)
Patient returned office phone call and note was read from Dr Clent Ridges. Patient wanted Dr Clent Ridges to know he has blood in urine, but will follow up with urology.

## 2022-11-13 ENCOUNTER — Encounter: Payer: Self-pay | Admitting: Family Medicine

## 2022-11-13 DIAGNOSIS — R3129 Other microscopic hematuria: Secondary | ICD-10-CM | POA: Diagnosis not present

## 2022-11-13 DIAGNOSIS — R319 Hematuria, unspecified: Secondary | ICD-10-CM | POA: Diagnosis not present

## 2022-11-13 DIAGNOSIS — R5383 Other fatigue: Secondary | ICD-10-CM | POA: Insufficient documentation

## 2022-11-13 NOTE — Assessment & Plan Note (Signed)
Chronic.  Multifactorial Check CBC, Cmet, TSH, Iron, Ferritin, Vitamin B 12, Vitamin D and U/A Has follow up appointment scheduled with [facility] urology for ongoing hematuria Follows with cardiology, vascular, pain management Follow up with labs.

## 2022-11-15 ENCOUNTER — Other Ambulatory Visit: Payer: Self-pay | Admitting: Family Medicine

## 2022-11-15 DIAGNOSIS — E559 Vitamin D deficiency, unspecified: Secondary | ICD-10-CM

## 2022-11-21 DIAGNOSIS — M25511 Pain in right shoulder: Secondary | ICD-10-CM | POA: Diagnosis not present

## 2022-11-21 DIAGNOSIS — Z79891 Long term (current) use of opiate analgesic: Secondary | ICD-10-CM | POA: Diagnosis not present

## 2022-11-21 DIAGNOSIS — G8929 Other chronic pain: Secondary | ICD-10-CM | POA: Diagnosis not present

## 2022-11-27 DIAGNOSIS — I5022 Chronic systolic (congestive) heart failure: Secondary | ICD-10-CM | POA: Diagnosis not present

## 2022-11-27 DIAGNOSIS — Z4502 Encounter for adjustment and management of automatic implantable cardiac defibrillator: Secondary | ICD-10-CM | POA: Diagnosis not present

## 2022-11-27 DIAGNOSIS — I4729 Other ventricular tachycardia: Secondary | ICD-10-CM | POA: Diagnosis not present

## 2022-11-27 DIAGNOSIS — I255 Ischemic cardiomyopathy: Secondary | ICD-10-CM | POA: Diagnosis not present

## 2022-11-27 DIAGNOSIS — Z95810 Presence of automatic (implantable) cardiac defibrillator: Secondary | ICD-10-CM | POA: Diagnosis not present

## 2022-11-29 ENCOUNTER — Other Ambulatory Visit: Payer: Self-pay | Admitting: Family Medicine

## 2022-11-29 DIAGNOSIS — G4700 Insomnia, unspecified: Secondary | ICD-10-CM

## 2022-11-29 NOTE — Telephone Encounter (Signed)
ZOLPIDEM TARTRATE 10 MG TABLET LR 10/30/2022 # 30 LOV 11/02/2022 NOV 03/19/2023

## 2022-11-30 DIAGNOSIS — R310 Gross hematuria: Secondary | ICD-10-CM | POA: Diagnosis not present

## 2022-11-30 DIAGNOSIS — R319 Hematuria, unspecified: Secondary | ICD-10-CM | POA: Diagnosis not present

## 2022-11-30 DIAGNOSIS — N200 Calculus of kidney: Secondary | ICD-10-CM | POA: Diagnosis not present

## 2022-12-04 DIAGNOSIS — I5022 Chronic systolic (congestive) heart failure: Secondary | ICD-10-CM | POA: Diagnosis not present

## 2022-12-04 DIAGNOSIS — I25118 Atherosclerotic heart disease of native coronary artery with other forms of angina pectoris: Secondary | ICD-10-CM | POA: Diagnosis not present

## 2022-12-14 DIAGNOSIS — M25511 Pain in right shoulder: Secondary | ICD-10-CM | POA: Diagnosis not present

## 2022-12-19 DIAGNOSIS — G8929 Other chronic pain: Secondary | ICD-10-CM | POA: Diagnosis not present

## 2022-12-19 DIAGNOSIS — Z79891 Long term (current) use of opiate analgesic: Secondary | ICD-10-CM | POA: Diagnosis not present

## 2022-12-19 DIAGNOSIS — M48062 Spinal stenosis, lumbar region with neurogenic claudication: Secondary | ICD-10-CM | POA: Diagnosis not present

## 2022-12-24 ENCOUNTER — Other Ambulatory Visit: Payer: Self-pay | Admitting: Family Medicine

## 2022-12-26 ENCOUNTER — Other Ambulatory Visit: Payer: Self-pay | Admitting: Family Medicine

## 2022-12-26 DIAGNOSIS — E559 Vitamin D deficiency, unspecified: Secondary | ICD-10-CM

## 2022-12-26 DIAGNOSIS — N200 Calculus of kidney: Secondary | ICD-10-CM | POA: Diagnosis not present

## 2022-12-26 DIAGNOSIS — R319 Hematuria, unspecified: Secondary | ICD-10-CM | POA: Diagnosis not present

## 2022-12-28 ENCOUNTER — Other Ambulatory Visit: Payer: Self-pay | Admitting: Family Medicine

## 2022-12-28 DIAGNOSIS — G4700 Insomnia, unspecified: Secondary | ICD-10-CM

## 2022-12-29 NOTE — Telephone Encounter (Signed)
Refilled: 11/30/2022 Last OV: 11/02/2022 Next OV: 03/19/2023

## 2023-01-02 ENCOUNTER — Telehealth: Payer: Self-pay | Admitting: Family Medicine

## 2023-01-02 NOTE — Telephone Encounter (Signed)
Prescription Request  01/02/2023  LOV: 11/02/2022  What is the name of the medication or equipment? zolpidem (AMBIEN) 10 MG tablet   Have you contacted your pharmacy to request a refill? Yes   Which pharmacy would you like this sent to?   [id number] [place], [place] - [address] [place] [address] Phone: [phone] Fax: [phone]    Patient notified that their request is being sent to the clinical staff for review and that they should receive a response within 2 business days.   Please advise at Mobile [phone] (mobile)

## 2023-01-09 DIAGNOSIS — I2510 Atherosclerotic heart disease of native coronary artery without angina pectoris: Secondary | ICD-10-CM | POA: Diagnosis not present

## 2023-01-09 DIAGNOSIS — Z95810 Presence of automatic (implantable) cardiac defibrillator: Secondary | ICD-10-CM | POA: Diagnosis not present

## 2023-01-09 DIAGNOSIS — Z4502 Encounter for adjustment and management of automatic implantable cardiac defibrillator: Secondary | ICD-10-CM | POA: Diagnosis not present

## 2023-01-09 DIAGNOSIS — I4729 Other ventricular tachycardia: Secondary | ICD-10-CM | POA: Diagnosis not present

## 2023-01-09 DIAGNOSIS — I255 Ischemic cardiomyopathy: Secondary | ICD-10-CM | POA: Diagnosis not present

## 2023-01-09 DIAGNOSIS — I5022 Chronic systolic (congestive) heart failure: Secondary | ICD-10-CM | POA: Diagnosis not present

## 2023-01-09 DIAGNOSIS — I10 Essential (primary) hypertension: Secondary | ICD-10-CM | POA: Diagnosis not present

## 2023-01-16 DIAGNOSIS — G8929 Other chronic pain: Secondary | ICD-10-CM | POA: Diagnosis not present

## 2023-01-16 DIAGNOSIS — M25512 Pain in left shoulder: Secondary | ICD-10-CM | POA: Diagnosis not present

## 2023-01-16 DIAGNOSIS — M25511 Pain in right shoulder: Secondary | ICD-10-CM | POA: Diagnosis not present

## 2023-01-16 DIAGNOSIS — Z79891 Long term (current) use of opiate analgesic: Secondary | ICD-10-CM | POA: Diagnosis not present

## 2023-01-29 ENCOUNTER — Other Ambulatory Visit: Payer: Self-pay

## 2023-01-29 ENCOUNTER — Telehealth: Payer: Self-pay | Admitting: Family Medicine

## 2023-01-29 DIAGNOSIS — G4700 Insomnia, unspecified: Secondary | ICD-10-CM

## 2023-01-29 NOTE — Telephone Encounter (Signed)
RX request sent to provider.

## 2023-01-29 NOTE — Telephone Encounter (Signed)
Patient just called and needs a refill on his medication. The name is zolpidem (AMBIEN) 10 MG tablet. The pharmacy he use [id number] [place], [place] - [address], [place] [address] Phone: [phone]  Fax: [phone] DEA #: [id number]  He is out of his medication. His number is [phone].

## 2023-01-31 ENCOUNTER — Encounter: Payer: Self-pay | Admitting: Family Medicine

## 2023-01-31 NOTE — Telephone Encounter (Signed)
error 

## 2023-01-31 NOTE — Telephone Encounter (Signed)
Patient called and would like status on his med refill

## 2023-01-31 NOTE — Telephone Encounter (Signed)
RX request sent to provider.

## 2023-02-01 MED ORDER — ZOLPIDEM TARTRATE 10 MG PO TABS
10.0000 mg | ORAL_TABLET | Freq: Every day | ORAL | 0 refills | Status: DC
Start: 2023-02-01 — End: 2023-02-26

## 2023-02-01 NOTE — Telephone Encounter (Signed)
There were two encounters opened unfortunately medication still not filled.   Requesting: Ambien Contract: No UDS: no Last Visit: 11/02/2022 Next Visit: 03/19/2023 Last Refill: 01/03/23  Please Advise

## 2023-02-07 DIAGNOSIS — I6523 Occlusion and stenosis of bilateral carotid arteries: Secondary | ICD-10-CM | POA: Diagnosis not present

## 2023-02-07 DIAGNOSIS — Z95810 Presence of automatic (implantable) cardiac defibrillator: Secondary | ICD-10-CM | POA: Diagnosis not present

## 2023-02-07 DIAGNOSIS — Z7901 Long term (current) use of anticoagulants: Secondary | ICD-10-CM | POA: Diagnosis not present

## 2023-02-07 DIAGNOSIS — I252 Old myocardial infarction: Secondary | ICD-10-CM | POA: Diagnosis not present

## 2023-02-07 DIAGNOSIS — Z95820 Peripheral vascular angioplasty status with implants and grafts: Secondary | ICD-10-CM | POA: Diagnosis not present

## 2023-02-07 DIAGNOSIS — I5022 Chronic systolic (congestive) heart failure: Secondary | ICD-10-CM | POA: Diagnosis not present

## 2023-02-07 DIAGNOSIS — Z955 Presence of coronary angioplasty implant and graft: Secondary | ICD-10-CM | POA: Diagnosis not present

## 2023-02-07 DIAGNOSIS — I739 Peripheral vascular disease, unspecified: Secondary | ICD-10-CM | POA: Diagnosis not present

## 2023-02-07 DIAGNOSIS — F17210 Nicotine dependence, cigarettes, uncomplicated: Secondary | ICD-10-CM | POA: Diagnosis not present

## 2023-02-07 DIAGNOSIS — I779 Disorder of arteries and arterioles, unspecified: Secondary | ICD-10-CM | POA: Diagnosis not present

## 2023-02-07 DIAGNOSIS — I2510 Atherosclerotic heart disease of native coronary artery without angina pectoris: Secondary | ICD-10-CM | POA: Diagnosis not present

## 2023-02-07 DIAGNOSIS — Z7982 Long term (current) use of aspirin: Secondary | ICD-10-CM | POA: Diagnosis not present

## 2023-02-07 DIAGNOSIS — E785 Hyperlipidemia, unspecified: Secondary | ICD-10-CM | POA: Diagnosis not present

## 2023-02-07 DIAGNOSIS — Z79899 Other long term (current) drug therapy: Secondary | ICD-10-CM | POA: Diagnosis not present

## 2023-02-07 DIAGNOSIS — I110 Hypertensive heart disease with heart failure: Secondary | ICD-10-CM | POA: Diagnosis not present

## 2023-02-13 DIAGNOSIS — G8929 Other chronic pain: Secondary | ICD-10-CM | POA: Diagnosis not present

## 2023-02-13 DIAGNOSIS — Z79891 Long term (current) use of opiate analgesic: Secondary | ICD-10-CM | POA: Diagnosis not present

## 2023-02-13 DIAGNOSIS — M25511 Pain in right shoulder: Secondary | ICD-10-CM | POA: Diagnosis not present

## 2023-02-13 DIAGNOSIS — M25512 Pain in left shoulder: Secondary | ICD-10-CM | POA: Diagnosis not present

## 2023-02-15 DIAGNOSIS — M25512 Pain in left shoulder: Secondary | ICD-10-CM | POA: Diagnosis not present

## 2023-02-26 ENCOUNTER — Other Ambulatory Visit: Payer: Self-pay

## 2023-02-26 ENCOUNTER — Telehealth: Payer: Self-pay | Admitting: Family Medicine

## 2023-02-26 DIAGNOSIS — G4700 Insomnia, unspecified: Secondary | ICD-10-CM

## 2023-02-26 MED ORDER — ZOLPIDEM TARTRATE 10 MG PO TABS
10.0000 mg | ORAL_TABLET | Freq: Every day | ORAL | 0 refills | Status: AC
Start: 2023-02-26 — End: ?

## 2023-02-26 NOTE — Telephone Encounter (Signed)
Patient just called and said he needs a refill on his medication. The name is zolpidem (AMBIEN) 10 MG tablet. The pharmacy he use [id number] [place], [place] - [address], [place] [address] Phone: [phone]  Fax: [phone] DEA #: [id number]  His number is [phone].

## 2023-02-26 NOTE — Telephone Encounter (Signed)
 Rx request sent to provider

## 2023-02-28 ENCOUNTER — Other Ambulatory Visit: Payer: Self-pay | Admitting: Family Medicine

## 2023-02-28 DIAGNOSIS — G4700 Insomnia, unspecified: Secondary | ICD-10-CM

## 2023-03-12 ENCOUNTER — Other Ambulatory Visit: Payer: Medicare Other

## 2023-03-12 DIAGNOSIS — M5416 Radiculopathy, lumbar region: Secondary | ICD-10-CM | POA: Diagnosis not present

## 2023-03-13 DIAGNOSIS — N200 Calculus of kidney: Secondary | ICD-10-CM | POA: Diagnosis not present

## 2023-03-13 DIAGNOSIS — R310 Gross hematuria: Secondary | ICD-10-CM | POA: Diagnosis not present

## 2023-03-14 ENCOUNTER — Other Ambulatory Visit: Payer: Self-pay | Admitting: Family Medicine

## 2023-03-14 DIAGNOSIS — Z79891 Long term (current) use of opiate analgesic: Secondary | ICD-10-CM | POA: Diagnosis not present

## 2023-03-14 DIAGNOSIS — M25511 Pain in right shoulder: Secondary | ICD-10-CM | POA: Diagnosis not present

## 2023-03-14 DIAGNOSIS — M48062 Spinal stenosis, lumbar region with neurogenic claudication: Secondary | ICD-10-CM | POA: Diagnosis not present

## 2023-03-14 DIAGNOSIS — G8929 Other chronic pain: Secondary | ICD-10-CM | POA: Diagnosis not present

## 2023-03-14 DIAGNOSIS — G4700 Insomnia, unspecified: Secondary | ICD-10-CM

## 2023-03-15 ENCOUNTER — Other Ambulatory Visit: Payer: Self-pay

## 2023-03-15 DIAGNOSIS — G4700 Insomnia, unspecified: Secondary | ICD-10-CM

## 2023-03-19 ENCOUNTER — Encounter: Payer: Medicare Other | Admitting: Family Medicine

## 2023-03-19 NOTE — Patient Instructions (Incomplete)
It was a pleasure meeting you today. Thank you for allowing me to take part in your health care.  Our goals for today as we discussed include:  Continue current medications  Schedule an appointment for you Medicare annual wellness  Referral sent for Mammogram. Please call to schedule appointment. [facility] [address] [place], [place] [address] [phone]   If you have any questions or concerns, please do not hesitate to call the office at [phone].  I look forward to our next visit and until then take care and stay safe.  Regards,   Zed Wanninger, MD   [place]  

## 2023-03-19 NOTE — Progress Notes (Unsigned)
   SUBJECTIVE:  No chief complaint on file.  HPI ***  PERTINENT PMH / PSH: ***  OBJECTIVE:  There were no vitals taken for this visit.   Physical Exam     11/02/2022    8:11 AM 07/27/2022    9:07 AM 12/08/2021    9:10 AM 07/21/2021    8:53 AM 05/24/2021    9:54 AM  Depression screen PHQ 2/9  Decreased Interest 2 0 1 0 0  Down, Depressed, Hopeless 0 0 0 0 0  PHQ - 2 Score 2 0 1 0 0  Altered sleeping 3      Tired, decreased energy 3      Change in appetite 3      Feeling bad or failure about yourself  0      Trouble concentrating 3      Moving slowly or fidgety/restless 3      Suicidal thoughts 0      PHQ-9 Score 17      Difficult doing work/chores Very difficult          11/02/2022    8:11 AM 10/17/2019   10:38 AM 06/27/2019    8:13 AM  GAD 7 : Generalized Anxiety Score  Nervous, Anxious, on Edge 0 0 0  Control/stop worrying 0 0 0  Worry too much - different things 0 0 0  Trouble relaxing 0 0 0  Restless 0 0 0  Easily annoyed or irritable 1 0 0  Afraid - awful might happen 1 0 0  Total GAD 7 Score 2 0 0  Anxiety Difficulty Very difficult Not difficult at all Not difficult at all    ASSESSMENT/PLAN:  There are no diagnoses linked to this encounter. PDMP reviewed***  No follow-ups on file.  Dana Allan, MD

## 2023-03-24 ENCOUNTER — Other Ambulatory Visit: Payer: Self-pay | Admitting: Family Medicine

## 2023-03-24 DIAGNOSIS — E531 Pyridoxine deficiency: Secondary | ICD-10-CM

## 2023-03-24 DIAGNOSIS — G8929 Other chronic pain: Secondary | ICD-10-CM

## 2023-03-28 DIAGNOSIS — I25118 Atherosclerotic heart disease of native coronary artery with other forms of angina pectoris: Secondary | ICD-10-CM | POA: Diagnosis not present

## 2023-03-28 DIAGNOSIS — R799 Abnormal finding of blood chemistry, unspecified: Secondary | ICD-10-CM | POA: Diagnosis not present

## 2023-03-28 DIAGNOSIS — J432 Centrilobular emphysema: Secondary | ICD-10-CM | POA: Diagnosis not present

## 2023-04-10 DIAGNOSIS — I200 Unstable angina: Secondary | ICD-10-CM | POA: Diagnosis not present

## 2023-04-10 DIAGNOSIS — E785 Hyperlipidemia, unspecified: Secondary | ICD-10-CM | POA: Diagnosis not present

## 2023-04-10 DIAGNOSIS — I5022 Chronic systolic (congestive) heart failure: Secondary | ICD-10-CM | POA: Diagnosis not present

## 2023-04-10 DIAGNOSIS — I4729 Other ventricular tachycardia: Secondary | ICD-10-CM | POA: Diagnosis not present

## 2023-04-10 DIAGNOSIS — I2510 Atherosclerotic heart disease of native coronary artery without angina pectoris: Secondary | ICD-10-CM | POA: Diagnosis not present

## 2023-04-10 DIAGNOSIS — I10 Essential (primary) hypertension: Secondary | ICD-10-CM | POA: Diagnosis not present

## 2023-04-10 DIAGNOSIS — Z4502 Encounter for adjustment and management of automatic implantable cardiac defibrillator: Secondary | ICD-10-CM | POA: Diagnosis not present

## 2023-04-10 DIAGNOSIS — I2089 Other forms of angina pectoris: Secondary | ICD-10-CM | POA: Diagnosis not present

## 2023-04-10 DIAGNOSIS — I255 Ischemic cardiomyopathy: Secondary | ICD-10-CM | POA: Diagnosis not present

## 2023-04-10 DIAGNOSIS — Z95810 Presence of automatic (implantable) cardiac defibrillator: Secondary | ICD-10-CM | POA: Diagnosis not present

## 2023-04-17 DIAGNOSIS — M47817 Spondylosis without myelopathy or radiculopathy, lumbosacral region: Secondary | ICD-10-CM | POA: Diagnosis not present

## 2023-04-17 DIAGNOSIS — M25511 Pain in right shoulder: Secondary | ICD-10-CM | POA: Diagnosis not present

## 2023-04-17 DIAGNOSIS — Z79891 Long term (current) use of opiate analgesic: Secondary | ICD-10-CM | POA: Diagnosis not present

## 2023-04-17 DIAGNOSIS — G8929 Other chronic pain: Secondary | ICD-10-CM | POA: Diagnosis not present

## 2023-05-10 DIAGNOSIS — G44209 Tension-type headache, unspecified, not intractable: Secondary | ICD-10-CM | POA: Diagnosis not present

## 2023-05-15 DIAGNOSIS — M25512 Pain in left shoulder: Secondary | ICD-10-CM | POA: Diagnosis not present

## 2023-05-15 DIAGNOSIS — Z79891 Long term (current) use of opiate analgesic: Secondary | ICD-10-CM | POA: Diagnosis not present

## 2023-05-15 DIAGNOSIS — G8929 Other chronic pain: Secondary | ICD-10-CM | POA: Diagnosis not present

## 2023-05-15 DIAGNOSIS — K5903 Drug induced constipation: Secondary | ICD-10-CM | POA: Diagnosis not present

## 2023-05-15 DIAGNOSIS — M25511 Pain in right shoulder: Secondary | ICD-10-CM | POA: Diagnosis not present

## 2023-05-15 DIAGNOSIS — M47817 Spondylosis without myelopathy or radiculopathy, lumbosacral region: Secondary | ICD-10-CM | POA: Diagnosis not present

## 2023-06-01 DIAGNOSIS — R001 Bradycardia, unspecified: Secondary | ICD-10-CM | POA: Diagnosis not present

## 2023-06-01 DIAGNOSIS — I5022 Chronic systolic (congestive) heart failure: Secondary | ICD-10-CM | POA: Diagnosis not present

## 2023-06-01 DIAGNOSIS — I517 Cardiomegaly: Secondary | ICD-10-CM | POA: Diagnosis not present

## 2023-06-01 DIAGNOSIS — I4729 Other ventricular tachycardia: Secondary | ICD-10-CM | POA: Diagnosis not present

## 2023-06-01 DIAGNOSIS — I4589 Other specified conduction disorders: Secondary | ICD-10-CM | POA: Diagnosis not present

## 2023-06-01 DIAGNOSIS — I255 Ischemic cardiomyopathy: Secondary | ICD-10-CM | POA: Diagnosis not present

## 2023-06-01 DIAGNOSIS — Z4502 Encounter for adjustment and management of automatic implantable cardiac defibrillator: Secondary | ICD-10-CM | POA: Diagnosis not present

## 2023-06-04 DIAGNOSIS — M50323 Other cervical disc degeneration at C6-C7 level: Secondary | ICD-10-CM | POA: Diagnosis not present

## 2023-06-04 DIAGNOSIS — G4700 Insomnia, unspecified: Secondary | ICD-10-CM | POA: Diagnosis not present

## 2023-06-04 DIAGNOSIS — M5032 Other cervical disc degeneration, mid-cervical region, unspecified level: Secondary | ICD-10-CM | POA: Diagnosis not present

## 2023-06-04 DIAGNOSIS — G44209 Tension-type headache, unspecified, not intractable: Secondary | ICD-10-CM | POA: Diagnosis not present

## 2023-06-04 DIAGNOSIS — M50322 Other cervical disc degeneration at C5-C6 level: Secondary | ICD-10-CM | POA: Diagnosis not present

## 2023-06-04 DIAGNOSIS — M542 Cervicalgia: Secondary | ICD-10-CM | POA: Diagnosis not present

## 2023-06-04 DIAGNOSIS — M50321 Other cervical disc degeneration at C4-C5 level: Secondary | ICD-10-CM | POA: Diagnosis not present

## 2023-06-07 DIAGNOSIS — I2089 Other forms of angina pectoris: Secondary | ICD-10-CM | POA: Diagnosis not present

## 2023-06-07 DIAGNOSIS — Z720 Tobacco use: Secondary | ICD-10-CM | POA: Diagnosis not present

## 2023-06-07 DIAGNOSIS — I739 Peripheral vascular disease, unspecified: Secondary | ICD-10-CM | POA: Diagnosis not present

## 2023-06-07 DIAGNOSIS — E782 Mixed hyperlipidemia: Secondary | ICD-10-CM | POA: Diagnosis not present

## 2023-06-07 DIAGNOSIS — I2510 Atherosclerotic heart disease of native coronary artery without angina pectoris: Secondary | ICD-10-CM | POA: Diagnosis not present

## 2023-06-07 DIAGNOSIS — I5022 Chronic systolic (congestive) heart failure: Secondary | ICD-10-CM | POA: Diagnosis not present

## 2023-06-11 ENCOUNTER — Other Ambulatory Visit: Payer: Self-pay | Admitting: Family Medicine

## 2023-06-11 DIAGNOSIS — E559 Vitamin D deficiency, unspecified: Secondary | ICD-10-CM

## 2023-06-12 DIAGNOSIS — M47812 Spondylosis without myelopathy or radiculopathy, cervical region: Secondary | ICD-10-CM | POA: Diagnosis not present

## 2023-06-12 DIAGNOSIS — M25512 Pain in left shoulder: Secondary | ICD-10-CM | POA: Diagnosis not present

## 2023-06-12 DIAGNOSIS — K5903 Drug induced constipation: Secondary | ICD-10-CM | POA: Diagnosis not present

## 2023-06-12 DIAGNOSIS — G8929 Other chronic pain: Secondary | ICD-10-CM | POA: Diagnosis not present

## 2023-06-12 DIAGNOSIS — Z79891 Long term (current) use of opiate analgesic: Secondary | ICD-10-CM | POA: Diagnosis not present

## 2023-06-12 DIAGNOSIS — M48062 Spinal stenosis, lumbar region with neurogenic claudication: Secondary | ICD-10-CM | POA: Diagnosis not present

## 2023-06-12 DIAGNOSIS — M25511 Pain in right shoulder: Secondary | ICD-10-CM | POA: Diagnosis not present

## 2023-06-13 NOTE — Telephone Encounter (Signed)
 Refilled: 12/26/2022 Last OV: 11/02/2022 Next OV: not scheduled

## 2023-06-28 DIAGNOSIS — M47812 Spondylosis without myelopathy or radiculopathy, cervical region: Secondary | ICD-10-CM | POA: Diagnosis not present

## 2023-07-02 ENCOUNTER — Other Ambulatory Visit: Payer: Self-pay | Admitting: Family Medicine

## 2023-07-03 NOTE — Telephone Encounter (Signed)
 Can you refuse medication since your not pcp we can't send meds to [facility] providers

## 2023-07-12 DIAGNOSIS — G8929 Other chronic pain: Secondary | ICD-10-CM | POA: Diagnosis not present

## 2023-07-12 DIAGNOSIS — M48062 Spinal stenosis, lumbar region with neurogenic claudication: Secondary | ICD-10-CM | POA: Diagnosis not present

## 2023-07-12 DIAGNOSIS — Z79891 Long term (current) use of opiate analgesic: Secondary | ICD-10-CM | POA: Diagnosis not present

## 2023-07-12 DIAGNOSIS — M542 Cervicalgia: Secondary | ICD-10-CM | POA: Diagnosis not present

## 2023-07-12 DIAGNOSIS — K5903 Drug induced constipation: Secondary | ICD-10-CM | POA: Diagnosis not present

## 2023-07-16 DIAGNOSIS — G4700 Insomnia, unspecified: Secondary | ICD-10-CM | POA: Diagnosis not present

## 2023-07-16 DIAGNOSIS — R338 Other retention of urine: Secondary | ICD-10-CM | POA: Diagnosis not present

## 2023-07-16 DIAGNOSIS — J432 Centrilobular emphysema: Secondary | ICD-10-CM | POA: Diagnosis not present

## 2023-07-19 DIAGNOSIS — J22 Unspecified acute lower respiratory infection: Secondary | ICD-10-CM | POA: Diagnosis not present

## 2023-07-19 DIAGNOSIS — R0602 Shortness of breath: Secondary | ICD-10-CM | POA: Diagnosis not present

## 2023-07-19 DIAGNOSIS — J432 Centrilobular emphysema: Secondary | ICD-10-CM | POA: Diagnosis not present

## 2023-07-19 DIAGNOSIS — R059 Cough, unspecified: Secondary | ICD-10-CM | POA: Diagnosis not present

## 2023-07-19 DIAGNOSIS — F17200 Nicotine dependence, unspecified, uncomplicated: Secondary | ICD-10-CM | POA: Diagnosis not present

## 2023-07-19 DIAGNOSIS — R062 Wheezing: Secondary | ICD-10-CM | POA: Diagnosis not present

## 2023-07-25 DIAGNOSIS — J441 Chronic obstructive pulmonary disease with (acute) exacerbation: Secondary | ICD-10-CM | POA: Diagnosis not present

## 2023-07-25 DIAGNOSIS — R052 Subacute cough: Secondary | ICD-10-CM | POA: Diagnosis not present

## 2023-08-06 DIAGNOSIS — M5416 Radiculopathy, lumbar region: Secondary | ICD-10-CM | POA: Diagnosis not present

## 2023-08-08 DIAGNOSIS — I779 Disorder of arteries and arterioles, unspecified: Secondary | ICD-10-CM | POA: Diagnosis not present

## 2023-08-08 DIAGNOSIS — Z95820 Peripheral vascular angioplasty status with implants and grafts: Secondary | ICD-10-CM | POA: Diagnosis not present

## 2023-08-08 DIAGNOSIS — I739 Peripheral vascular disease, unspecified: Secondary | ICD-10-CM | POA: Diagnosis not present

## 2023-08-09 DIAGNOSIS — M542 Cervicalgia: Secondary | ICD-10-CM | POA: Diagnosis not present

## 2023-08-09 DIAGNOSIS — Z79891 Long term (current) use of opiate analgesic: Secondary | ICD-10-CM | POA: Diagnosis not present

## 2023-08-09 DIAGNOSIS — M48062 Spinal stenosis, lumbar region with neurogenic claudication: Secondary | ICD-10-CM | POA: Diagnosis not present

## 2023-08-09 DIAGNOSIS — K5903 Drug induced constipation: Secondary | ICD-10-CM | POA: Diagnosis not present

## 2023-08-09 DIAGNOSIS — G8929 Other chronic pain: Secondary | ICD-10-CM | POA: Diagnosis not present

## 2023-09-11 DIAGNOSIS — G8929 Other chronic pain: Secondary | ICD-10-CM | POA: Diagnosis not present

## 2023-09-11 DIAGNOSIS — M47817 Spondylosis without myelopathy or radiculopathy, lumbosacral region: Secondary | ICD-10-CM | POA: Diagnosis not present

## 2023-09-11 DIAGNOSIS — Z79891 Long term (current) use of opiate analgesic: Secondary | ICD-10-CM | POA: Diagnosis not present

## 2023-09-13 ENCOUNTER — Other Ambulatory Visit: Payer: Self-pay | Admitting: Family Medicine

## 2023-09-13 DIAGNOSIS — E559 Vitamin D deficiency, unspecified: Secondary | ICD-10-CM

## 2023-10-08 DIAGNOSIS — I2510 Atherosclerotic heart disease of native coronary artery without angina pectoris: Secondary | ICD-10-CM | POA: Diagnosis not present

## 2023-10-08 DIAGNOSIS — I10 Essential (primary) hypertension: Secondary | ICD-10-CM | POA: Diagnosis not present

## 2023-10-08 DIAGNOSIS — E785 Hyperlipidemia, unspecified: Secondary | ICD-10-CM | POA: Diagnosis not present

## 2023-10-08 DIAGNOSIS — I2089 Other forms of angina pectoris: Secondary | ICD-10-CM | POA: Diagnosis not present

## 2023-10-08 DIAGNOSIS — I4729 Other ventricular tachycardia: Secondary | ICD-10-CM | POA: Diagnosis not present

## 2023-10-08 DIAGNOSIS — I5022 Chronic systolic (congestive) heart failure: Secondary | ICD-10-CM | POA: Diagnosis not present

## 2023-10-08 DIAGNOSIS — I255 Ischemic cardiomyopathy: Secondary | ICD-10-CM | POA: Diagnosis not present

## 2023-10-08 DIAGNOSIS — Z95810 Presence of automatic (implantable) cardiac defibrillator: Secondary | ICD-10-CM | POA: Diagnosis not present

## 2023-10-08 DIAGNOSIS — Z4502 Encounter for adjustment and management of automatic implantable cardiac defibrillator: Secondary | ICD-10-CM | POA: Diagnosis not present

## 2023-10-08 DIAGNOSIS — G4700 Insomnia, unspecified: Secondary | ICD-10-CM | POA: Diagnosis not present

## 2023-10-08 DIAGNOSIS — I200 Unstable angina: Secondary | ICD-10-CM | POA: Diagnosis not present

## 2023-10-08 DIAGNOSIS — E559 Vitamin D deficiency, unspecified: Secondary | ICD-10-CM | POA: Diagnosis not present

## 2023-10-08 NOTE — Progress Notes (Signed)
 [facility] Established Patient Clinic Note  Assessment/Plan: [name] is a 68 y.o.male  Problem List Items Addressed This Visit     Erectile dysfunction - Primary   Requesting PRN for Ed. Took ED medication years ago. Has complex history. Per patient, he is not taking Imdur  -- self-discontinued 2/2 hypotension/orthostasis. Discussed risk/benefits of taking Sildenafil  with his other conditions -- patient understands.   Low dose sildenafil  25 mg PRN sent to [facility]       Relevant Medications   sildenafil  (VIAGRA ) 25 MG tablet   Essential hypertension   Managed by cardiology.   BP Readings from Last 3 Encounters:  10/08/23 134/86  08/08/23 112/74  07/25/23 163/93   History of orthostasis. Needs updated BMP      Relevant Orders   Basic metabolic panel   Vitamin D  deficiency   Last level ~12. Will start high dose weekly Vit D x 12 wks       Relevant Medications   ergocalciferol -1,250 mcg, 50,000 unit, (DRISDOL ) 1,250 mcg (50,000 unit) capsule   Other Relevant Orders   Vitamin D  25 Hydroxy (25OH D2 + D3)   Insomnia   Refills of ambien  sent       Relevant Medications   zolpidem  (AMBIEN ) 10 mg tablet (Start on 10/22/2023)    Next Visit:    Subjective Martin Hernandez is a 68 y.o. male  coming to clinic today for the following issues:  No chief complaint on file.  HPI:  F/up vit  ED   I have reviewed the problem list, medications, and allergies and have updated/reconciled them if needed.  Martin Hernandez  reports that he has been smoking cigarettes. He started smoking about 50 years ago. He has a 25.4 pack-year smoking history. He has never been exposed to tobacco smoke. He has never used smokeless tobacco. Health Maintenance  Topic Date Due  . COPD Spirometry  Never done  . Hepatitis C Screen  Never done  . DTaP/Tdap/Td Vaccines (1 - Tdap) Never done  . Lung Cancer Screening Shared Decision Making  Never done   . Zoster Vaccines (1 of 2) Never done  . Medicare Annual Wellness Visit (AWV)  08/26/2023  . COVID-19 Vaccine (6 - 2024-25 season) 09/06/2023  . Potassium Monitoring  11/02/2023  . Serum Creatinine Monitoring  11/13/2023  . Influenza Vaccine (Season Ended) 12/10/2023  . Colon Cancer Screening  02/13/2032  . Pneumococcal Vaccine 50+  Completed    Review of Systems  Constitutional: Negative.   HENT: Negative.    Eyes: Negative.   Respiratory:  Positive for cough and shortness of breath.   Cardiovascular: Negative.   Gastrointestinal: Negative.   Genitourinary: Negative.   Musculoskeletal: Negative.   Skin: Negative.   Neurological:  Positive for dizziness and headaches.  Endo/Heme/Allergies: Negative.   Psychiatric/Behavioral:  Negative for depression and suicidal ideas. The patient is not nervous/anxious.     Objective  VITALS: BP 134/86 (BP Position: Sitting, BP Cuff Size: Small)   Pulse 51   Temp 36.7 C (98 F) (Temporal)   Wt 54.9 kg (121 lb)   BMI 17.48 kg/m   Physical Exam Vitals reviewed.  Constitutional:      Appearance: Normal appearance.  HENT:     Head: Normocephalic and atraumatic.     Right Ear: External ear normal.     Left Ear: External ear normal.     Nose: Nose normal.     Mouth/Throat:     Mouth: Mucous  membranes are moist.   Eyes:     Conjunctiva/sclera: Conjunctivae normal.    Cardiovascular:     Rate and Rhythm: Normal rate.  Pulmonary:     Effort: Pulmonary effort is normal.  Abdominal:     General: Abdomen is flat.   Musculoskeletal:     Cervical back: Normal range of motion.     Right lower leg: No edema.     Left lower leg: No edema.   Neurological:     General: No focal deficit present.     Mental Status: He is alert and oriented to person, place, and time.   Psychiatric:        Mood and Affect: Mood normal.        Behavior: Behavior normal.     LABS/IMAGING Lab Results  Component Value Date   A1C 5.6 03/28/2023    EAG 114 03/28/2023   TSH 2.20 04/12/2010   LDL 36 (L) 03/28/2023   HDL 41 03/28/2023   NA 144 08/05/2022   K 3.5 08/05/2022   CO2 30.0 08/05/2022   BUN 11 08/05/2022   CREATININE 0.9 11/13/2022   EGFR >90 11/13/2022   ALBUMIN 2.4 (L) 07/08/2022   AST 29 07/08/2022   ALT 11 07/08/2022   ALKPHOS 38 (L) 07/08/2022   MG 1.9 03/28/2023   WBC 7.3 08/05/2022   HGB 8.7 (L) 08/05/2022   HCT 27.3 (L) 08/05/2022   PLT 300 08/05/2022   VITAMINB12 363 04/12/2010    I personally spent 30 minutes face-to-face and non-face-to-face in the care of this patient, which includes all pre, intra, and post visit time on the date of service.  All documented time was specific to the E/M visit and does not include any procedures that may have been performed.    [facility]  at [facility] [address] [place], [place] [address] . Telephone [phone] . Fax [phone] [email or web address] [place] [employer]. Lyle, MD Clinical Assistant Professor  [facility]

## 2023-10-11 DIAGNOSIS — M47817 Spondylosis without myelopathy or radiculopathy, lumbosacral region: Secondary | ICD-10-CM | POA: Diagnosis not present

## 2023-10-11 DIAGNOSIS — G8929 Other chronic pain: Secondary | ICD-10-CM | POA: Diagnosis not present

## 2023-10-11 DIAGNOSIS — Z79891 Long term (current) use of opiate analgesic: Secondary | ICD-10-CM | POA: Diagnosis not present

## 2023-10-19 DIAGNOSIS — M47816 Spondylosis without myelopathy or radiculopathy, lumbar region: Secondary | ICD-10-CM | POA: Diagnosis not present

## 2023-11-08 DIAGNOSIS — G8929 Other chronic pain: Secondary | ICD-10-CM | POA: Diagnosis not present

## 2023-11-08 DIAGNOSIS — Z79891 Long term (current) use of opiate analgesic: Secondary | ICD-10-CM | POA: Diagnosis not present

## 2023-11-08 DIAGNOSIS — M47817 Spondylosis without myelopathy or radiculopathy, lumbosacral region: Secondary | ICD-10-CM | POA: Diagnosis not present

## 2023-11-09 DIAGNOSIS — E870 Hyperosmolality and hypernatremia: Secondary | ICD-10-CM | POA: Diagnosis not present

## 2023-11-09 DIAGNOSIS — I5022 Chronic systolic (congestive) heart failure: Secondary | ICD-10-CM | POA: Diagnosis not present

## 2023-11-09 DIAGNOSIS — I25118 Atherosclerotic heart disease of native coronary artery with other forms of angina pectoris: Secondary | ICD-10-CM | POA: Diagnosis not present

## 2023-11-09 DIAGNOSIS — G8929 Other chronic pain: Secondary | ICD-10-CM | POA: Diagnosis not present

## 2023-11-09 DIAGNOSIS — Z7982 Long term (current) use of aspirin: Secondary | ICD-10-CM | POA: Diagnosis not present

## 2023-11-09 DIAGNOSIS — N10 Acute tubulo-interstitial nephritis: Secondary | ICD-10-CM | POA: Diagnosis not present

## 2023-11-09 DIAGNOSIS — R300 Dysuria: Secondary | ICD-10-CM | POA: Diagnosis not present

## 2023-11-09 DIAGNOSIS — Z95820 Peripheral vascular angioplasty status with implants and grafts: Secondary | ICD-10-CM | POA: Diagnosis not present

## 2023-11-09 DIAGNOSIS — I77811 Abdominal aortic ectasia: Secondary | ICD-10-CM | POA: Diagnosis not present

## 2023-11-09 DIAGNOSIS — F17210 Nicotine dependence, cigarettes, uncomplicated: Secondary | ICD-10-CM | POA: Diagnosis not present

## 2023-11-09 DIAGNOSIS — I110 Hypertensive heart disease with heart failure: Secondary | ICD-10-CM | POA: Diagnosis not present

## 2023-11-09 DIAGNOSIS — R531 Weakness: Secondary | ICD-10-CM | POA: Diagnosis not present

## 2023-11-09 DIAGNOSIS — J449 Chronic obstructive pulmonary disease, unspecified: Secondary | ICD-10-CM | POA: Diagnosis not present

## 2023-11-09 DIAGNOSIS — M545 Low back pain, unspecified: Secondary | ICD-10-CM | POA: Diagnosis not present

## 2023-11-09 DIAGNOSIS — I219 Acute myocardial infarction, unspecified: Secondary | ICD-10-CM | POA: Diagnosis not present

## 2023-11-09 DIAGNOSIS — I2510 Atherosclerotic heart disease of native coronary artery without angina pectoris: Secondary | ICD-10-CM | POA: Diagnosis not present

## 2023-11-09 DIAGNOSIS — I255 Ischemic cardiomyopathy: Secondary | ICD-10-CM | POA: Diagnosis not present

## 2023-11-09 DIAGNOSIS — I739 Peripheral vascular disease, unspecified: Secondary | ICD-10-CM | POA: Diagnosis not present

## 2023-11-09 DIAGNOSIS — I5020 Unspecified systolic (congestive) heart failure: Secondary | ICD-10-CM | POA: Diagnosis not present

## 2023-11-09 DIAGNOSIS — Z955 Presence of coronary angioplasty implant and graft: Secondary | ICD-10-CM | POA: Diagnosis not present

## 2023-11-09 DIAGNOSIS — Z79899 Other long term (current) drug therapy: Secondary | ICD-10-CM | POA: Diagnosis not present

## 2023-11-09 DIAGNOSIS — Z7901 Long term (current) use of anticoagulants: Secondary | ICD-10-CM | POA: Diagnosis not present

## 2023-11-09 DIAGNOSIS — E785 Hyperlipidemia, unspecified: Secondary | ICD-10-CM | POA: Diagnosis not present

## 2023-11-09 DIAGNOSIS — I252 Old myocardial infarction: Secondary | ICD-10-CM | POA: Diagnosis not present

## 2023-11-09 DIAGNOSIS — N200 Calculus of kidney: Secondary | ICD-10-CM | POA: Diagnosis not present

## 2023-11-09 DIAGNOSIS — Z95810 Presence of automatic (implantable) cardiac defibrillator: Secondary | ICD-10-CM | POA: Diagnosis not present

## 2023-11-09 DIAGNOSIS — N12 Tubulo-interstitial nephritis, not specified as acute or chronic: Secondary | ICD-10-CM | POA: Diagnosis not present

## 2023-11-09 DIAGNOSIS — I714 Abdominal aortic aneurysm, without rupture, unspecified: Secondary | ICD-10-CM | POA: Diagnosis not present

## 2023-11-10 DIAGNOSIS — I5022 Chronic systolic (congestive) heart failure: Secondary | ICD-10-CM | POA: Diagnosis not present

## 2023-11-10 DIAGNOSIS — N390 Urinary tract infection, site not specified: Secondary | ICD-10-CM | POA: Diagnosis not present

## 2023-11-10 DIAGNOSIS — N12 Tubulo-interstitial nephritis, not specified as acute or chronic: Secondary | ICD-10-CM | POA: Diagnosis not present

## 2023-11-11 DIAGNOSIS — N12 Tubulo-interstitial nephritis, not specified as acute or chronic: Secondary | ICD-10-CM | POA: Diagnosis not present

## 2023-11-11 DIAGNOSIS — I5022 Chronic systolic (congestive) heart failure: Secondary | ICD-10-CM | POA: Diagnosis not present

## 2023-11-11 DIAGNOSIS — N390 Urinary tract infection, site not specified: Secondary | ICD-10-CM | POA: Diagnosis not present

## 2023-11-12 DIAGNOSIS — N390 Urinary tract infection, site not specified: Secondary | ICD-10-CM | POA: Diagnosis not present

## 2023-11-12 DIAGNOSIS — I5022 Chronic systolic (congestive) heart failure: Secondary | ICD-10-CM | POA: Diagnosis not present

## 2023-11-13 DIAGNOSIS — N390 Urinary tract infection, site not specified: Secondary | ICD-10-CM | POA: Diagnosis not present

## 2023-11-13 DIAGNOSIS — I5032 Chronic diastolic (congestive) heart failure: Secondary | ICD-10-CM | POA: Diagnosis not present

## 2023-11-13 DIAGNOSIS — N12 Tubulo-interstitial nephritis, not specified as acute or chronic: Secondary | ICD-10-CM | POA: Diagnosis not present

## 2023-11-13 DIAGNOSIS — I739 Peripheral vascular disease, unspecified: Secondary | ICD-10-CM | POA: Diagnosis not present

## 2023-11-14 DIAGNOSIS — I5022 Chronic systolic (congestive) heart failure: Secondary | ICD-10-CM | POA: Diagnosis not present

## 2023-11-14 DIAGNOSIS — R339 Retention of urine, unspecified: Secondary | ICD-10-CM | POA: Diagnosis not present

## 2023-11-16 DIAGNOSIS — Z95810 Presence of automatic (implantable) cardiac defibrillator: Secondary | ICD-10-CM | POA: Diagnosis not present

## 2023-11-16 DIAGNOSIS — R0789 Other chest pain: Secondary | ICD-10-CM | POA: Diagnosis not present

## 2023-11-16 DIAGNOSIS — R918 Other nonspecific abnormal finding of lung field: Secondary | ICD-10-CM | POA: Diagnosis not present

## 2023-11-16 DIAGNOSIS — I25118 Atherosclerotic heart disease of native coronary artery with other forms of angina pectoris: Secondary | ICD-10-CM | POA: Diagnosis not present

## 2023-11-16 DIAGNOSIS — I25119 Atherosclerotic heart disease of native coronary artery with unspecified angina pectoris: Secondary | ICD-10-CM | POA: Diagnosis not present

## 2023-11-16 DIAGNOSIS — R9431 Abnormal electrocardiogram [ECG] [EKG]: Secondary | ICD-10-CM | POA: Diagnosis not present

## 2023-11-16 DIAGNOSIS — Z7901 Long term (current) use of anticoagulants: Secondary | ICD-10-CM | POA: Diagnosis not present

## 2023-11-16 DIAGNOSIS — R001 Bradycardia, unspecified: Secondary | ICD-10-CM | POA: Diagnosis not present

## 2023-11-16 DIAGNOSIS — I517 Cardiomegaly: Secondary | ICD-10-CM | POA: Diagnosis not present

## 2023-11-16 DIAGNOSIS — I110 Hypertensive heart disease with heart failure: Secondary | ICD-10-CM | POA: Diagnosis not present

## 2023-11-16 DIAGNOSIS — I444 Left anterior fascicular block: Secondary | ICD-10-CM | POA: Diagnosis not present

## 2023-11-16 DIAGNOSIS — F17210 Nicotine dependence, cigarettes, uncomplicated: Secondary | ICD-10-CM | POA: Diagnosis not present

## 2023-11-16 DIAGNOSIS — R0602 Shortness of breath: Secondary | ICD-10-CM | POA: Diagnosis not present

## 2023-11-16 DIAGNOSIS — R110 Nausea: Secondary | ICD-10-CM | POA: Diagnosis not present

## 2023-11-16 DIAGNOSIS — R42 Dizziness and giddiness: Secondary | ICD-10-CM | POA: Diagnosis not present

## 2023-11-16 DIAGNOSIS — I5022 Chronic systolic (congestive) heart failure: Secondary | ICD-10-CM | POA: Diagnosis not present

## 2023-11-16 DIAGNOSIS — R079 Chest pain, unspecified: Secondary | ICD-10-CM | POA: Diagnosis not present

## 2023-11-16 DIAGNOSIS — Z955 Presence of coronary angioplasty implant and graft: Secondary | ICD-10-CM | POA: Diagnosis not present

## 2023-11-16 DIAGNOSIS — R531 Weakness: Secondary | ICD-10-CM | POA: Diagnosis not present

## 2023-11-16 DIAGNOSIS — I252 Old myocardial infarction: Secondary | ICD-10-CM | POA: Diagnosis not present

## 2023-12-04 DIAGNOSIS — R079 Chest pain, unspecified: Secondary | ICD-10-CM | POA: Diagnosis not present

## 2023-12-04 DIAGNOSIS — R300 Dysuria: Secondary | ICD-10-CM | POA: Diagnosis not present

## 2023-12-04 DIAGNOSIS — I5041 Acute combined systolic (congestive) and diastolic (congestive) heart failure: Secondary | ICD-10-CM | POA: Diagnosis not present

## 2023-12-04 DIAGNOSIS — R799 Abnormal finding of blood chemistry, unspecified: Secondary | ICD-10-CM | POA: Diagnosis not present

## 2023-12-06 DIAGNOSIS — E782 Mixed hyperlipidemia: Secondary | ICD-10-CM | POA: Diagnosis not present

## 2023-12-06 DIAGNOSIS — I5022 Chronic systolic (congestive) heart failure: Secondary | ICD-10-CM | POA: Diagnosis not present

## 2023-12-06 DIAGNOSIS — I2510 Atherosclerotic heart disease of native coronary artery without angina pectoris: Secondary | ICD-10-CM | POA: Diagnosis not present

## 2023-12-06 DIAGNOSIS — I10 Essential (primary) hypertension: Secondary | ICD-10-CM | POA: Diagnosis not present

## 2023-12-09 DIAGNOSIS — N200 Calculus of kidney: Secondary | ICD-10-CM | POA: Diagnosis not present

## 2023-12-09 DIAGNOSIS — Z7901 Long term (current) use of anticoagulants: Secondary | ICD-10-CM | POA: Diagnosis not present

## 2023-12-09 DIAGNOSIS — I2510 Atherosclerotic heart disease of native coronary artery without angina pectoris: Secondary | ICD-10-CM | POA: Diagnosis not present

## 2023-12-09 DIAGNOSIS — I5022 Chronic systolic (congestive) heart failure: Secondary | ICD-10-CM | POA: Diagnosis not present

## 2023-12-09 DIAGNOSIS — Z7982 Long term (current) use of aspirin: Secondary | ICD-10-CM | POA: Diagnosis not present

## 2023-12-09 DIAGNOSIS — I509 Heart failure, unspecified: Secondary | ICD-10-CM | POA: Diagnosis not present

## 2023-12-09 DIAGNOSIS — I252 Old myocardial infarction: Secondary | ICD-10-CM | POA: Diagnosis not present

## 2023-12-09 DIAGNOSIS — I77811 Abdominal aortic ectasia: Secondary | ICD-10-CM | POA: Diagnosis not present

## 2023-12-09 DIAGNOSIS — Z79899 Other long term (current) drug therapy: Secondary | ICD-10-CM | POA: Diagnosis not present

## 2023-12-09 DIAGNOSIS — I110 Hypertensive heart disease with heart failure: Secondary | ICD-10-CM | POA: Diagnosis not present

## 2023-12-09 DIAGNOSIS — R319 Hematuria, unspecified: Secondary | ICD-10-CM | POA: Diagnosis not present

## 2023-12-09 DIAGNOSIS — F17210 Nicotine dependence, cigarettes, uncomplicated: Secondary | ICD-10-CM | POA: Diagnosis not present

## 2023-12-09 DIAGNOSIS — E875 Hyperkalemia: Secondary | ICD-10-CM | POA: Diagnosis not present

## 2023-12-11 DIAGNOSIS — Z79891 Long term (current) use of opiate analgesic: Secondary | ICD-10-CM | POA: Diagnosis not present

## 2023-12-11 DIAGNOSIS — M47817 Spondylosis without myelopathy or radiculopathy, lumbosacral region: Secondary | ICD-10-CM | POA: Diagnosis not present

## 2023-12-11 DIAGNOSIS — G8929 Other chronic pain: Secondary | ICD-10-CM | POA: Diagnosis not present

## 2023-12-28 DIAGNOSIS — G4700 Insomnia, unspecified: Secondary | ICD-10-CM | POA: Diagnosis not present

## 2023-12-28 DIAGNOSIS — R319 Hematuria, unspecified: Secondary | ICD-10-CM | POA: Diagnosis not present

## 2023-12-28 DIAGNOSIS — E538 Deficiency of other specified B group vitamins: Secondary | ICD-10-CM | POA: Diagnosis not present

## 2023-12-28 DIAGNOSIS — G8929 Other chronic pain: Secondary | ICD-10-CM | POA: Diagnosis not present

## 2023-12-28 DIAGNOSIS — I25118 Atherosclerotic heart disease of native coronary artery with other forms of angina pectoris: Secondary | ICD-10-CM | POA: Diagnosis not present

## 2023-12-28 DIAGNOSIS — I5022 Chronic systolic (congestive) heart failure: Secondary | ICD-10-CM | POA: Diagnosis not present

## 2023-12-28 DIAGNOSIS — M545 Low back pain, unspecified: Secondary | ICD-10-CM | POA: Diagnosis not present

## 2023-12-28 DIAGNOSIS — E559 Vitamin D deficiency, unspecified: Secondary | ICD-10-CM | POA: Diagnosis not present

## 2023-12-28 DIAGNOSIS — Z23 Encounter for immunization: Secondary | ICD-10-CM | POA: Diagnosis not present

## 2024-01-08 DIAGNOSIS — Z95810 Presence of automatic (implantable) cardiac defibrillator: Secondary | ICD-10-CM | POA: Diagnosis not present

## 2024-01-08 DIAGNOSIS — Z4502 Encounter for adjustment and management of automatic implantable cardiac defibrillator: Secondary | ICD-10-CM | POA: Diagnosis not present

## 2024-01-08 DIAGNOSIS — I5022 Chronic systolic (congestive) heart failure: Secondary | ICD-10-CM | POA: Diagnosis not present

## 2024-01-08 DIAGNOSIS — I4729 Other ventricular tachycardia: Secondary | ICD-10-CM | POA: Diagnosis not present

## 2024-01-08 DIAGNOSIS — I255 Ischemic cardiomyopathy: Secondary | ICD-10-CM | POA: Diagnosis not present

## 2024-01-08 DIAGNOSIS — I2510 Atherosclerotic heart disease of native coronary artery without angina pectoris: Secondary | ICD-10-CM | POA: Diagnosis not present

## 2024-01-08 DIAGNOSIS — I2089 Other forms of angina pectoris: Secondary | ICD-10-CM | POA: Diagnosis not present

## 2024-01-08 DIAGNOSIS — E785 Hyperlipidemia, unspecified: Secondary | ICD-10-CM | POA: Diagnosis not present

## 2024-01-08 DIAGNOSIS — I10 Essential (primary) hypertension: Secondary | ICD-10-CM | POA: Diagnosis not present

## 2024-01-08 DIAGNOSIS — I200 Unstable angina: Secondary | ICD-10-CM | POA: Diagnosis not present

## 2024-01-10 DIAGNOSIS — M47816 Spondylosis without myelopathy or radiculopathy, lumbar region: Secondary | ICD-10-CM | POA: Diagnosis not present

## 2024-02-07 DIAGNOSIS — G8929 Other chronic pain: Secondary | ICD-10-CM | POA: Diagnosis not present

## 2024-02-07 DIAGNOSIS — M47817 Spondylosis without myelopathy or radiculopathy, lumbosacral region: Secondary | ICD-10-CM | POA: Diagnosis not present

## 2024-02-07 DIAGNOSIS — Z79891 Long term (current) use of opiate analgesic: Secondary | ICD-10-CM | POA: Diagnosis not present
